# Patient Record
Sex: Male | Born: 1935 | Race: White | Hispanic: No | Smoking: Former smoker
Health system: Southern US, Community
[De-identification: ages and names within clinical notes are randomized; demographics above are authoritative.]

## PROBLEM LIST (undated history)

## (undated) DIAGNOSIS — M1990 Unspecified osteoarthritis, unspecified site: Secondary | ICD-10-CM

## (undated) DIAGNOSIS — J449 Chronic obstructive pulmonary disease, unspecified: Secondary | ICD-10-CM

## (undated) DIAGNOSIS — J8410 Pulmonary fibrosis, unspecified: Secondary | ICD-10-CM

## (undated) DIAGNOSIS — K219 Gastro-esophageal reflux disease without esophagitis: Secondary | ICD-10-CM

## (undated) DIAGNOSIS — I509 Heart failure, unspecified: Secondary | ICD-10-CM

## (undated) DIAGNOSIS — I499 Cardiac arrhythmia, unspecified: Secondary | ICD-10-CM

## (undated) HISTORY — PX: TONSILLECTOMY: SUR1361

## (undated) HISTORY — PX: OTHER SURGICAL HISTORY: SHX169

## (undated) HISTORY — PX: HERNIA REPAIR: SHX51

---

## 1997-10-22 ENCOUNTER — Ambulatory Visit (HOSPITAL_COMMUNITY): Admission: RE | Admit: 1997-10-22 | Discharge: 1997-10-22 | Payer: Self-pay | Admitting: Family Medicine

## 2007-01-25 ENCOUNTER — Encounter: Admission: RE | Admit: 2007-01-25 | Discharge: 2007-01-25 | Payer: Self-pay | Admitting: Internal Medicine

## 2007-01-31 ENCOUNTER — Ambulatory Visit: Payer: Self-pay | Admitting: Critical Care Medicine

## 2007-01-31 LAB — CONVERTED CEMR LAB
ANA Titer 1: 1:40 {titer} — ABNORMAL HIGH
Anti Nuclear Antibody(ANA): POSITIVE — AB
IgE (Immunoglobulin E), Serum: 2171.2 intl units/mL — ABNORMAL HIGH (ref 0.0–180.0)
Rhuematoid fact SerPl-aCnc: <20 intl units/mL — ABNORMAL LOW (ref 0.0–20.0)

## 2007-03-14 ENCOUNTER — Ambulatory Visit: Payer: Self-pay | Admitting: Critical Care Medicine

## 2007-03-15 DIAGNOSIS — J8410 Pulmonary fibrosis, unspecified: Secondary | ICD-10-CM | POA: Insufficient documentation

## 2007-03-15 DIAGNOSIS — J479 Bronchiectasis, uncomplicated: Secondary | ICD-10-CM

## 2007-03-15 DIAGNOSIS — J309 Allergic rhinitis, unspecified: Secondary | ICD-10-CM | POA: Insufficient documentation

## 2007-06-02 ENCOUNTER — Ambulatory Visit: Payer: Self-pay | Admitting: Critical Care Medicine

## 2009-12-25 ENCOUNTER — Encounter: Admission: RE | Admit: 2009-12-25 | Discharge: 2009-12-25 | Payer: Self-pay | Admitting: Neurology

## 2010-11-11 NOTE — Assessment & Plan Note (Signed)
[facility]                             PULMONARY OFFICE NOTE   NAME:[name], [name]                      MRN:          [id number]  DATE:03/14/2007                            DOB:          1925/07/08    [name] returns in followup, 77 year old with pulmonary fibrosis,  chronic cough.  CT scan showed bronchiectasis and minimal peripheral  fibrotic changes but no evidence of active alveolitis.  We started the  patient on Advair 100/50 one spray twice daily and he is back here for  followup visit.  Lab data had been reviewed.  His RAST assay was largely  positive, IgE level was at 2171.  He has positive hypersensitivity to  mites, dog and cat dander, all grasses, mold, house dust, weed  allergens, tree allergens.  His alpha-1 antitrypsin level is normal at  112, his ANA was positive at 1:40 titer.  His hypersensitivity assay was  negative.  His sed rate was 28, rheumatoid factor less than 20.  Pulmonary functions obtained today showed normal spirometry, total lung  capacity normal at 88% predicted, diffusion capacity normal at 96%  predicted.   EXAMINATION:  Temperature is 97, blood pressure 120/72, pulse 58,  saturation 94% on room air.  CHEST:  Showed to be clear today without evidence of wheeze, rhonchi.  CARDIAC:  Showed a regular rate and rhythm without S3, normal S1-S2.  ABDOMEN:  Soft, nontender.  EXTREMITIES:  Showed no edema or clubbing.  SKIN:  Clear.   IMPRESSION:  Mild pulmonary fibrosis and bronchiectasis, moderate  persistent asthma.   PLAN:  Maintain Advair as currently dosed, we will hold off on Xolair  therapy or allergy evaluation at this time with allergy desensitization  being held off.  He will pursue avoidance of passive smoke exposure with  his spouse and we will see the patient back in followup in 3 months.     Charlcie Cradle Delford Field, MD, [facility]  Electronically Signed    PEW/MedQ  DD: 03/14/2007  DT: 03/14/2007  Job #:  [id number]   cc:   Deirdre Peer. Polite, M.D.

## 2010-11-11 NOTE — Assessment & Plan Note (Signed)
[facility]                             PULMONARY OFFICE NOTE   [name], Cameron [name]                      MRN:          [id number]  DATE:06/02/2007                            DOB:          September 17, 1925    [name] returns for follow-up.  He is a 77 year old with asthmatic  bronchitis, mild pulmonary fibrosis, bronchiectasis.  He has multiple  atopic factors on his RAST assay with an IgE level of 2170, and he has  positivity to dust, molds, a variety of grasses and tree pollen, cat and  animal dander.  He is on the Advair 100/50 mcg one spray b.i.d. and is  having no cough, wheezing or shortness of breath at this time.   EXAM:  Temperature 97, blood pressure 120/68, pulse 64, saturation 97%  in room air.  CHEST:  Completely clear without evidence of wheeze, rale or rhonchi.  CARDIAC:  A regular rate and rhythm without S3, normal S1, S2.  ABDOMEN:  Soft, nontender.  EXTREMITIES:  No edema, clubbing or venous disease.  SKIN:  Clear.  NEUROLOGIC:  Intact.   IMPRESSION:  Bronchiectasis, mild pulmonary fibrosis.   PLAN:  To reduce to Pulmicort 180 mcg two sprays b.i.d. and discontinue  further Advair.  We will see the patient back in follow-up.     Charlcie Cradle Delford Field, MD, [facility]  Electronically Signed    PEW/MedQ  DD: 06/02/2007  DT: 06/02/2007  Job #: [id number]   cc:   Deirdre Peer. Polite, M.D.

## 2010-11-11 NOTE — Assessment & Plan Note (Signed)
[facility]                             PULMONARY OFFICE NOTE   NAME:[facility]on [name], Cameron [name]                      MRN:          [id number]  DATE:01/31/2007                            DOB:          04/13/1926    CHIEF COMPLAINT:  Evaluate pulmonary fibrosis.   HISTORY OF PRESENT ILLNESS:  This is a 77 year old white male who had  episode of bronchitis a week ago.  He was coughing up thick clear mucus.  He was treated with Advair 250/50 one spray b.i.d.  This has been  helpful to him.  He is coughing less.  He is less short of breath.  He  did have some shortness of breath with heavy exertion but not with  minimal exertion.  He is not short of breath at rest.  He has no  hemoptysis, no chest discomfort.  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  No previous history of known pneumonia yet does have  passive smoke exposure with his spouse, and he smoked two packs a day  for 15 years, but quit smoking in 1964.  He is referred for further  evaluation.  Does have some heartburn for which Prilosec is helpful.  He  has no difficulty swallowing.  Does note some nasal congestion.  He has  no hand or foot swelling, no joint swelling, no rash.   PAST MEDICAL HISTORY:  No history of hypertension, heart disease, heart  dysrhythmias, diabetes, cholesterol, stroke, cancer or blood clotting  disorders, kidney disease.   OPERATIVE HISTORY:  Herniorrhaphy 20 years ago, hemorrhoidectomy 45  years ago; otherwise, noncontributory.   MEDICATION ALLERGIES:  None.   CURRENT MEDICATIONS:  1. Prilosec 20 mg daily.  2. Vitamin B complex daily.  3. Aspirin 81 mg daily.  4. Fish oil daily.  5. Multivitamin daily.  6. Glucosamine daily.   SOCIAL HISTORY:  Smoking history is noted above.   FAMILY HISTORY:  Lives at home with his wife.  Retired from Landscape architect.   FAMILY HISTORY:  Allergies in a mother and sister.  Heart disease in a  mother.   Brother had lung cancer and had breast cancer.   PHYSICAL EXAMINATION:  VITAL SIGNS:  Temp 97.5, blood pressure 110/54,  pulse 86, saturation 97% on room air.  CHEST:  Showed distant breath sounds, no wheeze, rale or rhonchi noted.  CARDIAC:  Showed a regular rate and rhythm without S3, normal S1, S2.  ABDOMEN:  Soft, nontender.  EXTREMITIES:  Showed no edema, clubbing or venous disease.  SKIN:  Clear.  NEUROLOGIC:  Intact.  HEENT:  Showed no jugular venous distention, no lymphadenopathy.  Oropharynx clear.  NECK:  Supple.   LABORATORY DATA:  CT scan of the chest had been obtained and reviewed  and revealed patchy pulmonary fibrosis which is primarily peripheral  with central thickening extending to the pleural surface as well as  bronchiectasis in the upper and lower lung zones, irregular pleural  thickening as well, no masses seen, no lymphadenopathy seen.   IMPRESSION:  Bronchiectasis with associated traction of pulmonary  fibrosis likely related to previous chemical exposure as well as ongoing  smoke exposure passive and previous smoking in the past.  No evidence of  active or progressive pulmonary fibrosis or idiopathic __________  interstitial pneumonitis or desquamated interstitial pneumonitis.   RECOMMENDATIONS:  Continue Advair at 100/50 one spray b.i.d.  Try to  minimize further passive smoke exposure.  Obtain full set of pulmonary  function studies.  Obtain full set of labs including hypersensitivity  panel, ANA, alpha-1 antitrypsin, RAST assay, IgE, rheumatoid factor, and  sed rate.     Charlcie Cradle Delford Field, MD, [facility]  Electronically Signed    PEW/MedQ  DD: 01/31/2007  DT: 01/31/2007  Job #: [id number]   cc:   Deirdre Peer. Polite, M.D.

## 2012-04-14 ENCOUNTER — Other Ambulatory Visit: Payer: Self-pay | Admitting: Critical Care Medicine

## 2012-04-14 DIAGNOSIS — J8410 Pulmonary fibrosis, unspecified: Secondary | ICD-10-CM

## 2012-04-14 MED ORDER — INFLUENZA VIRUS VACC SPLIT PF IM SUSP
0.5000 mL | Freq: Once | INTRAMUSCULAR | Status: DC
Start: 2012-04-14 — End: 2012-04-14

## 2012-10-04 ENCOUNTER — Inpatient Hospital Stay (HOSPITAL_COMMUNITY)
Admission: AD | Admit: 2012-10-04 | Discharge: 2012-10-09 | DRG: 287 | Disposition: A | Discharge status: Home / Self Care | Payer: Medicare Other | Source: Ambulatory Visit | Attending: Internal Medicine | Admitting: Internal Medicine

## 2012-10-04 ENCOUNTER — Encounter (HOSPITAL_COMMUNITY): Payer: Self-pay | Admitting: Internal Medicine

## 2012-10-04 DIAGNOSIS — Z87891 Personal history of nicotine dependence: Secondary | ICD-10-CM

## 2012-10-04 DIAGNOSIS — I4819 Other persistent atrial fibrillation: Secondary | ICD-10-CM | POA: Diagnosis present

## 2012-10-04 DIAGNOSIS — J449 Chronic obstructive pulmonary disease, unspecified: Secondary | ICD-10-CM | POA: Diagnosis present

## 2012-10-04 DIAGNOSIS — I5021 Acute systolic (congestive) heart failure: Principal | ICD-10-CM | POA: Diagnosis present

## 2012-10-04 DIAGNOSIS — K219 Gastro-esophageal reflux disease without esophagitis: Secondary | ICD-10-CM | POA: Diagnosis present

## 2012-10-04 DIAGNOSIS — J4489 Other specified chronic obstructive pulmonary disease: Secondary | ICD-10-CM | POA: Diagnosis present

## 2012-10-04 DIAGNOSIS — E039 Hypothyroidism, unspecified: Secondary | ICD-10-CM | POA: Diagnosis present

## 2012-10-04 DIAGNOSIS — I2510 Atherosclerotic heart disease of native coronary artery without angina pectoris: Secondary | ICD-10-CM | POA: Diagnosis present

## 2012-10-04 DIAGNOSIS — Z7901 Long term (current) use of anticoagulants: Secondary | ICD-10-CM

## 2012-10-04 DIAGNOSIS — I428 Other cardiomyopathies: Secondary | ICD-10-CM | POA: Diagnosis present

## 2012-10-04 DIAGNOSIS — Z7982 Long term (current) use of aspirin: Secondary | ICD-10-CM

## 2012-10-04 DIAGNOSIS — Z79899 Other long term (current) drug therapy: Secondary | ICD-10-CM

## 2012-10-04 DIAGNOSIS — I509 Heart failure, unspecified: Secondary | ICD-10-CM | POA: Diagnosis present

## 2012-10-04 DIAGNOSIS — R413 Other amnesia: Secondary | ICD-10-CM | POA: Diagnosis present

## 2012-10-04 DIAGNOSIS — I4891 Unspecified atrial fibrillation: Secondary | ICD-10-CM | POA: Diagnosis present

## 2012-10-04 DIAGNOSIS — N289 Disorder of kidney and ureter, unspecified: Secondary | ICD-10-CM | POA: Diagnosis present

## 2012-10-04 DIAGNOSIS — J309 Allergic rhinitis, unspecified: Secondary | ICD-10-CM | POA: Diagnosis present

## 2012-10-04 DIAGNOSIS — J8410 Pulmonary fibrosis, unspecified: Secondary | ICD-10-CM | POA: Diagnosis present

## 2012-10-04 HISTORY — DX: Cardiac arrhythmia, unspecified: I49.9

## 2012-10-04 HISTORY — DX: Gastro-esophageal reflux disease without esophagitis: K21.9

## 2012-10-04 HISTORY — DX: Heart failure, unspecified: I50.9

## 2012-10-04 HISTORY — DX: Unspecified osteoarthritis, unspecified site: M19.90

## 2012-10-04 HISTORY — DX: Chronic obstructive pulmonary disease, unspecified: J44.9

## 2012-10-04 LAB — CBC WITH DIFFERENTIAL/PLATELET
Basophils Absolute: 0 10*3/uL (ref 0.0–0.1)
Eosinophils Relative: 3 % (ref 0–5)
HCT: 35.6 % — ABNORMAL LOW (ref 39.0–52.0)
Hemoglobin: 12.6 g/dL — ABNORMAL LOW (ref 13.0–17.0)
Lymphocytes Relative: 25 % (ref 12–46)
Lymphs Abs: 1.8 10*3/uL (ref 0.7–4.0)
MCV: 100 fL (ref 78.0–100.0)
Monocytes Absolute: 0.7 10*3/uL (ref 0.1–1.0)
Monocytes Relative: 10 % (ref 3–12)
Neutro Abs: 4.5 10*3/uL (ref 1.7–7.7)
RDW: 14.1 % (ref 11.5–15.5)
WBC: 7.2 10*3/uL (ref 4.0–10.5)

## 2012-10-04 LAB — COMPREHENSIVE METABOLIC PANEL
AST: 33 U/L (ref 0–37)
BUN: 23 mg/dL (ref 6–23)
CO2: 25 mEq/L (ref 19–32)
Calcium: 9.1 mg/dL (ref 8.4–10.5)
Chloride: 110 mEq/L (ref 96–112)
Creatinine, Ser: 1.26 mg/dL (ref 0.50–1.35)
GFR calc Af Amer: 59 mL/min — ABNORMAL LOW (ref >90)
GFR calc non Af Amer: 51 mL/min — ABNORMAL LOW (ref >90)
Glucose, Bld: 89 mg/dL (ref 70–99)
Total Bilirubin: 0.5 mg/dL (ref 0.3–1.2)

## 2012-10-04 LAB — TROPONIN I: Troponin I: <0.3 ng/mL (ref <0.30)

## 2012-10-04 LAB — MAGNESIUM: Magnesium: 2.5 mg/dL (ref 1.5–2.5)

## 2012-10-04 LAB — TSH: TSH: 6.165 u[IU]/mL — ABNORMAL HIGH (ref 0.350–4.500)

## 2012-10-04 MED ORDER — FUROSEMIDE 10 MG/ML IJ SOLN
20.0000 mg | Freq: Two times a day (BID) | INTRAMUSCULAR | Status: DC
  Administered 2012-10-04 – 2012-10-05 (×2): 20 mg via INTRAVENOUS
  Filled 2012-10-04 (×4): qty 2

## 2012-10-04 MED ORDER — SODIUM CHLORIDE 0.9 % IJ SOLN
3.0000 mL | Freq: Two times a day (BID) | INTRAMUSCULAR | Status: DC
  Administered 2012-10-04 – 2012-10-09 (×5): 3 mL via INTRAVENOUS

## 2012-10-04 MED ORDER — ACETAMINOPHEN 325 MG PO TABS: 650.0000 mg | ORAL_TABLET | ORAL | Status: DC | PRN

## 2012-10-04 MED ORDER — LISINOPRIL 2.5 MG PO TABS
2.5000 mg | ORAL_TABLET | Freq: Every day | ORAL | Status: DC
  Administered 2012-10-04: 2.5 mg via ORAL
  Filled 2012-10-04 (×2): qty 1

## 2012-10-04 MED ORDER — HEPARIN SODIUM (PORCINE) 5000 UNIT/ML IJ SOLN
5000.0000 [IU] | Freq: Three times a day (TID) | INTRAMUSCULAR | Status: DC
  Administered 2012-10-04 – 2012-10-05 (×2): 5000 [IU] via SUBCUTANEOUS
  Filled 2012-10-04 (×5): qty 1

## 2012-10-04 MED ORDER — SODIUM CHLORIDE 0.9 % IJ SOLN
3.0000 mL | INTRAMUSCULAR | Status: DC | PRN
  Administered 2012-10-05: 3 mL via INTRAVENOUS

## 2012-10-04 MED ORDER — SODIUM CHLORIDE 0.9 % IV SOLN
250.0000 mL | INTRAVENOUS | Status: DC | PRN
  Administered 2012-10-04: 250 mL via INTRAVENOUS

## 2012-10-04 MED ORDER — METOPROLOL TARTRATE 25 MG PO TABS
25.0000 mg | ORAL_TABLET | Freq: Two times a day (BID) | ORAL | Status: DC
  Administered 2012-10-04: 25 mg via ORAL
  Filled 2012-10-04 (×3): qty 1

## 2012-10-04 MED ORDER — ASPIRIN EC 325 MG PO TBEC
325.0000 mg | DELAYED_RELEASE_TABLET | Freq: Every day | ORAL | Status: DC
  Administered 2012-10-04: 325 mg via ORAL
  Filled 2012-10-04 (×2): qty 1

## 2012-10-04 MED ORDER — DEXTROSE 5 % IV SOLN
5.0000 mg/h | INTRAVENOUS | Status: DC
  Administered 2012-10-04: 10 mg/h via INTRAVENOUS
  Administered 2012-10-04 – 2012-10-05 (×2): 5 mg/h via INTRAVENOUS
  Filled 2012-10-04 (×2): qty 100

## 2012-10-04 MED ORDER — DILTIAZEM LOAD VIA INFUSION
10.0000 mg | Freq: Once | INTRAVENOUS | Status: AC
  Filled 2012-10-04: qty 10

## 2012-10-04 MED ORDER — METOPROLOL TARTRATE 12.5 MG HALF TABLET
12.5000 mg | ORAL_TABLET | Freq: Two times a day (BID) | ORAL | Status: DC
  Filled 2012-10-04: qty 1

## 2012-10-04 MED ORDER — ONDANSETRON HCL 4 MG/2ML IJ SOLN: 4.0000 mg | Freq: Four times a day (QID) | INTRAMUSCULAR | Status: DC | PRN

## 2012-10-04 NOTE — H&P (Signed)
[facility] History and Physical  JD MCCASTER JXB:[id number] DOB: 1925-10-27 DOA: 10/04/2012  Referring physician: Dr. Nehemiah Settle PCP: Katy Apo, MD  Specialists: none  Chief Complaint: SOB  HPI: Cameron [name] is a 79 y.o. male  The past medical history of gastritis that comes in for lower extremity swelling and shortness of breath with exertion 2 weeks prior to admission. He relates he had his annual physical with Dr. polite on the day of admission. Dr. polite found to be within the regular rhythm and the symptoms he was in here to [facility]. His legs been having getting edematous for the past 2 weeks progressively getting worse. He also has been having shortness of breath with exertion that has slowly and gradually progressed to poor he can you and walk 100 feet without getting short of breath. He denies any PND orthopnea. He denies any cough, chest pain in the past month. He relates no changes in bowel movement, vision, denies any dizziness, vomiting, nausea or palpitations.  Review of Systems: The patient denies anorexia, fever, weight loss,, vision loss, decreased hearing, hoarseness, chest pain, syncope,  balance deficits, hemoptysis, abdominal pain, melena, hematochezia, severe indigestion/heartburn, hematuria, incontinence, genital sores, muscle weakness, suspicious skin lesions, transient blindness, difficulty walking, depression, unusual weight change, abnormal bleeding, enlarged lymph nodes, angioedema, and breast masses.    Past Medical History  Diagnosis Date  . GERD (gastroesophageal reflux disease)    History reviewed. No pertinent past surgical history. Social History:  reports that he has quit smoking. His smoking use included Cigarettes. He has a 80 pack-year smoking history. He does not have any smokeless tobacco history on file. He reports that he drinks about 1.2 ounces of alcohol per week. He reports that he does not use illicit drugs. Visit home by  himself and perform all his ADLs  Not on File  Family History  Problem Relation Age of Onset  . Heart attack Mother   . Pulmonary embolism Father     Prior to Admission medications   Not on File   Physical Exam: Filed Vitals:   10/04/12 1344  BP: 120/89  Pulse: 120  Temp: 98.7 F (37.1 C)  TempSrc: Oral  Resp: 18  SpO2: 97%    BP 120/89  Pulse 120  Temp(Src) 98.7 F (37.1 C) (Oral)  Resp 18  SpO2 97%  General Appearance:    Alert, cooperative, no distress, appears stated age  Head:    Normocephalic, without obvious abnormality, atraumatic           Throat:   Lips, mucosa, and tongue normal; teeth and gums normal  Neck:   Supple, symmetrical, trachea midline, no JVD, positive hepatojugular reflux   Back:     Symmetric, no curvature, ROM normal, no CVA tenderness  Lungs:     good air movement with crackles in bases bilaterally.   Chest wall:    No tenderness or deformity  Heart:    Irregular rate and rhythm, S1 and S2 normal, no gallops or rubs   Abdomen:     Soft, non-tender, bowel sounds active all four quadrants,    no masses, no organomegaly        Extremities:   Extremities normal, atraumatic, no cyanosis, 3+ edema   Pulses:   2+ and symmetric all extremities  Skin:   Skin color, texture, turgor normal, no rashes or lesions  Lymph nodes:   Cervical, supraclavicular, and axillary nodes normal  Neurologic:   CNII-XII intact. Normal  strength, sensation and reflexes      throughout    Labs on Admission:  Basic Metabolic Panel: No results found for this basename: NA, K, CL, CO2, GLUCOSE, BUN, CREATININE, CALCIUM, MG, PHOS,  in the last 168 hours Liver Function Tests: No results found for this basename: AST, ALT, ALKPHOS, BILITOT, PROT, ALBUMIN,  in the last 168 hours No results found for this basename: LIPASE, AMYLASE,  in the last 168 hours No results found for this basename: AMMONIA,  in the last 168 hours CBC: No results found for this basename: WBC,  NEUTROABS, HGB, HCT, MCV, PLT,  in the last 168 hours Cardiac Enzymes: No results found for this basename: CKTOTAL, CKMB, CKMBINDEX, TROPONINI,  in the last 168 hours  BNP (last 3 results) No results found for this basename: PROBNP,  in the last 8760 hours CBG: No results found for this basename: GLUCAP,  in the last 168 hours  Radiological Exams on Admission: No results found.  EKG: Independently reviewed. pending  Assessment/Plan Acute systolic heart failure: - This seems most likely an episode of acute  heart failure, he does have positive JVD and hepatojugular reflux, he does crackles at his lung bases. I am going to start him on low dose Lasix and strict I's and O's, restrict his fluids to 1000 cc, low sodium diet. We'll get a 2-D echo, 12-lead EKG. Also start her on beta blocker and ace inhibitor low dose. We'll get a basic metabolic panel. I will also start her on Lasix twice a day and monitor strict I's and O's.  - Check a TSH and cycle his cardiac enzymes for possible silent MI.  - Replete potassium as needed, get daily weights.  Atrial fibrillation with RVR: - Start him on low-dose beta blocker as his pulse seems to be irregularly irregular. He is tachycardic at greater than 110, I will get EKG and admitted to telemetry monitor his rhythm.  Code Status: full Family Communication: none Disposition Plan: inpatient.   Time spent: 65 minutes  Marinda Elk [facility] Pager [phone]  If 7PM-7AM, please contact night-coverage [email or web address] Password [email or web address] 10/04/2012, 1:56 PM

## 2012-10-04 NOTE — Progress Notes (Signed)
Utilization Review Completed.Cameron [name] T4/01/2013  

## 2012-10-04 NOTE — Progress Notes (Signed)
Disposition Note  Cameron [name], is a 79 y.o. male,   MRN: [id number]  -  DOB - Apr 24, 1926  Outpatient Primary MD for the patient is No primary provider on file.   There were no vitals taken for this visit.  Active Problems:   ALLERGIC RHINITIS   Atrial fibrillation with RVR   Acute systolic heart failure   79 year old who comes in to Dr. Idelle Crouch office for with RVR and some fluid overload. Dr. polite and the patient did not feel comfortable treating this as an outpatient. Injury to her age and she does not have a caregiver which can help. So we'll bring her in for direct admission.

## 2012-10-04 NOTE — Progress Notes (Signed)
Tel show Atrial Fib, heart rate 130 to 140, bp 112/72 pt resting in bed,Dr. Robb Matar notified Egbert Garibaldi A

## 2012-10-05 ENCOUNTER — Inpatient Hospital Stay (HOSPITAL_COMMUNITY): Payer: Medicare Other

## 2012-10-05 LAB — BASIC METABOLIC PANEL
BUN: 24 mg/dL — ABNORMAL HIGH (ref 6–23)
CO2: 25 mEq/L (ref 19–32)
Calcium: 8.7 mg/dL (ref 8.4–10.5)
GFR calc non Af Amer: 44 mL/min — ABNORMAL LOW (ref >90)
Glucose, Bld: 106 mg/dL — ABNORMAL HIGH (ref 70–99)
Sodium: 140 mEq/L (ref 135–145)

## 2012-10-05 MED ORDER — DILTIAZEM HCL 60 MG PO TABS
120.0000 mg | ORAL_TABLET | Freq: Every day | ORAL | Status: DC
  Filled 2012-10-05: qty 2

## 2012-10-05 MED ORDER — DILTIAZEM HCL 100 MG IV SOLR
5.0000 mg/h | INTRAVENOUS | Status: DC
  Administered 2012-10-05: 5 mg/h via INTRAVENOUS
  Filled 2012-10-05: qty 100

## 2012-10-05 MED ORDER — PANTOPRAZOLE SODIUM 40 MG PO TBEC
40.0000 mg | DELAYED_RELEASE_TABLET | Freq: Every day | ORAL | Status: DC
  Administered 2012-10-05 – 2012-10-09 (×3): 40 mg via ORAL
  Filled 2012-10-05 (×3): qty 1

## 2012-10-05 MED ORDER — FUROSEMIDE 40 MG PO TABS
40.0000 mg | ORAL_TABLET | Freq: Every day | ORAL | Status: DC
  Administered 2012-10-05 – 2012-10-09 (×4): 40 mg via ORAL
  Filled 2012-10-05 (×5): qty 1

## 2012-10-05 MED ORDER — APIXABAN 5 MG PO TABS
5.0000 mg | ORAL_TABLET | Freq: Two times a day (BID) | ORAL | Status: DC
  Administered 2012-10-05: 5 mg via ORAL
  Filled 2012-10-05 (×2): qty 1

## 2012-10-05 MED ORDER — DILTIAZEM HCL 100 MG IV SOLR
5.0000 mg/h | INTRAVENOUS | Status: AC
  Administered 2012-10-05: 5 mg/h via INTRAVENOUS
  Filled 2012-10-05: qty 100

## 2012-10-05 MED ORDER — DONEPEZIL HCL 10 MG PO TABS
10.0000 mg | ORAL_TABLET | Freq: Every day | ORAL | Status: DC
  Administered 2012-10-05 – 2012-10-08 (×4): 10 mg via ORAL
  Filled 2012-10-05 (×5): qty 1

## 2012-10-05 MED ORDER — LEVOTHYROXINE SODIUM 75 MCG PO TABS
75.0000 ug | ORAL_TABLET | Freq: Every day | ORAL | Status: DC
  Administered 2012-10-05 – 2012-10-09 (×5): 75 ug via ORAL
  Filled 2012-10-05 (×6): qty 1

## 2012-10-05 MED ORDER — DILTIAZEM HCL ER COATED BEADS 120 MG PO CP24
120.0000 mg | ORAL_CAPSULE | Freq: Every day | ORAL | Status: DC
Start: 2012-10-05 — End: 2012-10-05
  Filled 2012-10-05: qty 1

## 2012-10-05 MED ORDER — METOPROLOL TARTRATE 25 MG PO TABS
25.0000 mg | ORAL_TABLET | Freq: Four times a day (QID) | ORAL | Status: DC
  Administered 2012-10-05: 25 mg via ORAL
  Filled 2012-10-05 (×5): qty 1

## 2012-10-05 NOTE — Progress Notes (Signed)
Spoke with Dr. Isabel Caprice about cardizem. Per note, wanted to continue IV cardizem, but PO cardizem had been ordered as well. Pt had not received PO dose. He clarified and said to continue the IV cardizem. Will continue to monitor. Duwaine Maxin, RN

## 2012-10-05 NOTE — Progress Notes (Signed)
Subjective: No problems overnight, patient states he feels a little better. Patient's heart rate is better controlled. Currently diltiazem drip is off. At this time at anticoagulation. Oral diltiazem. Patient has underlying lung disease therefore we'll hold beta blocker at this time. Await echo.  Objective: Vital signs in last 24 hours: Temp:  [98.1 F (36.7 C)-98.7 F (37.1 C)] 98.5 F (36.9 C) (04/09 0554) Pulse Rate:  [67-127] 93 (04/09 0554) Resp:  [18] 18 (04/08 2132) BP: (91-120)/(54-89) 98/75 mmHg (04/09 0554) SpO2:  [96 %-97 %] 97 % (04/09 0554) Weight:  [76.2 kg (167 lb 15.9 oz)-78.6 kg (173 lb 4.5 oz)] 76.2 kg (167 lb 15.9 oz) (04/09 0554) Weight change:  Last BM Date: 10/04/12  Intake/Output from previous day: 04/08 0701 - 04/09 0700 In: 480 [P.O.:480] Out: 1725 [Urine:1725] Intake/Output this shift:    General appearance: alert and cooperative Resp: right basilar crackles Cardio: irregularly irregular rhythm Extremities: 1-2+ edema  Lab Results:  Results for orders placed during the hospital encounter of 10/04/12 (from the past 24 hour(s))  CBC WITH DIFFERENTIAL     Status: Abnormal   Collection Time    10/04/12  2:34 PM      Result Value Range   WBC 7.2  4.0 - 10.5 K/uL   RBC 3.56 (*) 4.22 - 5.81 MIL/uL   Hemoglobin 12.6 (*) 13.0 - 17.0 g/dL   HCT 40.9 (*) 81.1 - 91.4 %   MCV 100.0  78.0 - 100.0 fL   MCH 35.4 (*) 26.0 - 34.0 pg   MCHC 35.4  30.0 - 36.0 g/dL   RDW 78.2  95.6 - 21.3 %   Platelets 158  150 - 400 K/uL   Neutrophils Relative 62  43 - 77 %   Neutro Abs 4.5  1.7 - 7.7 K/uL   Lymphocytes Relative 25  12 - 46 %   Lymphs Abs 1.8  0.7 - 4.0 K/uL   Monocytes Relative 10  3 - 12 %   Monocytes Absolute 0.7  0.1 - 1.0 K/uL   Eosinophils Relative 3  0 - 5 %   Eosinophils Absolute 0.2  0.0 - 0.7 K/uL   Basophils Relative 0  0 - 1 %   Basophils Absolute 0.0  0.0 - 0.1 K/uL  COMPREHENSIVE METABOLIC PANEL     Status: Abnormal   Collection Time   10/04/12  2:34 PM      Result Value Range   Sodium 143  135 - 145 mEq/L   Potassium 4.5  3.5 - 5.1 mEq/L   Chloride 110  96 - 112 mEq/L   CO2 25  19 - 32 mEq/L   Glucose, Bld 89  70 - 99 mg/dL   BUN 23  6 - 23 mg/dL   Creatinine, Ser 0.86  0.50 - 1.35 mg/dL   Calcium 9.1  8.4 - 57.8 mg/dL   Total Protein 6.0  6.0 - 8.3 g/dL   Albumin 3.1 (*) 3.5 - 5.2 g/dL   AST 33  0 - 37 U/L   ALT 33  0 - 53 U/L   Alkaline Phosphatase 42  39 - 117 U/L   Total Bilirubin 0.5  0.3 - 1.2 mg/dL   GFR calc non Af Amer 51 (*) >90 mL/min   GFR calc Af Amer 59 (*) >90 mL/min  MAGNESIUM     Status: None   Collection Time    10/04/12  2:34 PM      Result Value Range   Magnesium  2.5  1.5 - 2.5 mg/dL  TSH     Status: Abnormal   Collection Time    10/04/12  2:34 PM      Result Value Range   TSH 6.165 (*) 0.350 - 4.500 uIU/mL  TROPONIN I     Status: None   Collection Time    10/04/12  2:34 PM      Result Value Range   Troponin I <0.30  <0.30 ng/mL  TROPONIN I     Status: None   Collection Time    10/04/12  7:43 PM      Result Value Range   Troponin I <0.30  <0.30 ng/mL  TROPONIN I     Status: None   Collection Time    10/05/12  1:55 AM      Result Value Range   Troponin I <0.30  <0.30 ng/mL  BASIC METABOLIC PANEL     Status: Abnormal   Collection Time    10/05/12  1:55 AM      Result Value Range   Sodium 140  135 - 145 mEq/L   Potassium 4.2  3.5 - 5.1 mEq/L   Chloride 106  96 - 112 mEq/L   CO2 25  19 - 32 mEq/L   Glucose, Bld 106 (*) 70 - 99 mg/dL   BUN 24 (*) 6 - 23 mg/dL   Creatinine, Ser 1.61 (*) 0.50 - 1.35 mg/dL   Calcium 8.7  8.4 - 09.6 mg/dL   GFR calc non Af Amer 44 (*) >90 mL/min   GFR calc Af Amer 51 (*) >90 mL/min      Studies/Results: No results found.  Medications:  Prior to Admission:  Prescriptions prior to admission  Medication Sig Dispense Refill  . aspirin EC 81 MG tablet Take 81 mg by mouth daily.      . Multiple Vitamins-Minerals (MULTIVITAMIN WITH MINERALS)  tablet Take 1 tablet by mouth daily.      Marland Kitchen omeprazole (PRILOSEC OTC) 20 MG tablet Take 20 mg by mouth daily.      Marland Kitchen OVER THE COUNTER MEDICATION Take 1 tablet by mouth daily. Nutritional supplement - does not remember name       Scheduled: . aspirin EC  325 mg Oral Daily  . furosemide  20 mg Intravenous Q12H  . heparin  5,000 Units Subcutaneous Q8H  . lisinopril  2.5 mg Oral Daily  . metoprolol tartrate  25 mg Oral BID  . sodium chloride  3 mL Intravenous Q12H   Continuous: . diltiazem (CARDIZEM) infusion 5 mg/hr (10/05/12 0605)   EAV:WUJWJX chloride, acetaminophen, ondansetron (ZOFRAN) IV, sodium chloride  Assessment/Plan: New onset atrial fibrillation, troponin I negative. Patient presented with volume overload, dyspnea. Continue rate control with diltiazem orally. With history of lung disease will hold beta blocker. Add anti-coagulation. Patient does have some cognitive dysfunction and according to family there are plans to put him in assisted living at this time they are agreeable to anticoagulation. [facility] to see patient today Hypothyroidism, TSH still slightly elevated, patient recently had adjustment on an outpatient basis, we will continue to followup outpatient. Memory loss, continue Aricept   LOS: 1 day   Cameron [name] D 10/05/2012, 8:43 AM

## 2012-10-05 NOTE — Progress Notes (Signed)
  Echocardiogram 2D Echocardiogram has been performed.  Cameron [name] 10/05/2012, 2:03 PM

## 2012-10-05 NOTE — Consult Note (Signed)
Admit date: 10/04/2012 Referring Physician  Dr. polite Primary Physician  Dr. polite Primary Cardiologist  Elsia Lasota-new Reason for Consultation  atrial fibrillation  HPI: 79 year old man who had been noticing worsening leg edema.  He went to his primary care doctor yesterday and was found to be in atrial fibrillation with rapid ventricular response.  He denies any chest discomfort or shortness of breath.  He had been walking regularly at home without difficulty.  He was sent to the hospital and placed on intravenous diltiazem.  This has controlled his heart rate significantly.  Due to social issues, there is a concern as to whether or not he would tolerate anticoagulation.  He has some mild dementia.  He fell several weeks ago.  It was thought that he would need to be in a supervised setting to tolerate anticoagulation properly.  His eating has been erratic.  There are efforts to have him placed in assisted living.    He denies any prior bleeding problems.  He did have a chest CT in the past showing coronary calcifications.  He denies any anginal symptoms.    PMH:   Past Medical History  Diagnosis Date  . GERD (gastroesophageal reflux disease)   . CHF (congestive heart failure)   . Dysrhythmia     atrial fibrilation  . COPD (chronic obstructive pulmonary disease)   . Arthritis      PSH:   Past Surgical History  Procedure Laterality Date  . Hernia repair    . Tonsillectomy      Allergies:  Review of patient's allergies indicates no known allergies. Prior to Admit Meds:   Prescriptions prior to admission  Medication Sig Dispense Refill  . aspirin EC 81 MG tablet Take 81 mg by mouth daily.      . Multiple Vitamins-Minerals (MULTIVITAMIN WITH MINERALS) tablet Take 1 tablet by mouth daily.      Marland Kitchen omeprazole (PRILOSEC OTC) 20 MG tablet Take 20 mg by mouth daily.      Marland Kitchen OVER THE COUNTER MEDICATION Take 1 tablet by mouth daily. Nutritional supplement - does not remember name       Fam HX:     Family History  Problem Relation Age of Onset  . Heart attack Mother   . Pulmonary embolism Father    Social HX:    History   Social History  . Marital Status: Married    Spouse Name: N/A    Number of Children: N/A  . Years of Education: N/A   Occupational History  . Not on file.   Social History Main Topics  . Smoking status: Former Smoker -- 2.00 packs/day for 40 years    Types: Cigarettes    Quit date: 06/29/1961  . Smokeless tobacco: Never Used  . Alcohol Use: 1.2 oz/week    2 Glasses of wine per week     Comment: occasional  . Drug Use: No  . Sexually Active: No   Other Topics Concern  . Not on file   Social History Narrative  . No narrative on file     ROS:  All 11 ROS were addressed and are negative except what is stated in the HPI  Physical Exam: Blood pressure 98/75, pulse 93, temperature 98.5 F (36.9 C), temperature source Oral, resp. rate 18, height 5\' 10"  (1.778 m), weight 76.2 kg (167 lb 15.9 oz), SpO2 97.00%.    General:  well nourished, in no acute distress Head:  Normal cephalic and atramatic  Lungs:   Clear  bilaterally to auscultation and percussion. Heart:  Irregularly irregular rhythm, normal rate Abdomen: abdomen soft and non-tender Msk:  Normal strength and tone for age. Extremities:  1+ bilateral lower extremity pitting  edema.   Neuro: Alert and oriented X . Psych:  Normal ffect, responds appropriately    Labs:   Lab Results  Component Value Date   WBC 7.2 10/04/2012   HGB 12.6* 10/04/2012   HCT 35.6* 10/04/2012   MCV 100.0 10/04/2012   PLT 158 10/04/2012    Recent Labs Lab 10/04/12 1434 10/05/12 0155  NA 143 140  K 4.5 4.2  CL 110 106  CO2 25 25  BUN 23 24*  CREATININE 1.26 1.41*  CALCIUM 9.1 8.7  PROT 6.0  --   BILITOT 0.5  --   ALKPHOS 42  --   ALT 33  --   AST 33  --   GLUCOSE 89 106*   No results found for this basename: PTT   No results found for this basename: INR, PROTIME   Lab Results  Component Value Date    TROPONINI <0.30 10/05/2012     No results found for this basename: CHOL   No results found for this basename: HDL   No results found for this basename: LDLCALC   No results found for this basename: TRIG   No results found for this basename: CHOLHDL   No results found for this basename: LDLDIRECT      Radiology:  Dg Chest 2 View  10/05/2012  *RADIOLOGY REPORT*  Clinical Data: Shortness of breath, swelling in the legs, atrial fibrillation  CHEST - 2 VIEW  Comparison: Chest x-ray of 01/19/2007 and CT chest of 01/25/2007  Findings:  Diffusely coarse interstitial lung markings throughout the lungs primarily the lung bases is consistent with chronic interstitial lung disease with basilar predominance.  No focal infiltrate or effusion is seen.  The lungs are slightly hyperaerated. Mild cardiomegaly is present. No acute bony abnormality is seen.  IMPRESSION: Interstitial lung disease with basilar predominance.  No active process.  Cardiomegaly.   Original Report Authenticated By: Dwyane Dee, M.D.     EKG:  Atrial fibrillation with improved ventricular response, nonspecific ST segment changes  ASSESSMENT:  Atrial fibrillation of unclear duration  PLAN:  He'll need echocardiogram.  Continue IV diltiazem for now for rate control.  If his LV function is decreased, will likely switch him to a beta blocker.  Continue IV Lasix for fluid overload.  This may be related to diastolic dysfunction from the atrial fibrillation with rapid ventricular response.  Need to rule out systolic dysfunction with echocardiogram.  Evidence of coronary artery disease/calcifications by CT.  Consider outpatient Cardiolite stress test to evaluate for ischemia.  Discussed stroke prevention with the patient.  He has not had bleeding problems.  He is willing to take anticoagulation.  Will start Eliquis.  Will follow.  Corky Crafts., MD  10/05/2012  11:24 AM
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
## 2012-10-06 LAB — BASIC METABOLIC PANEL
CO2: 23 mEq/L (ref 19–32)
Calcium: 8.4 mg/dL (ref 8.4–10.5)
Glucose, Bld: 99 mg/dL (ref 70–99)
Potassium: 3.9 mEq/L (ref 3.5–5.1)
Sodium: 134 mEq/L — ABNORMAL LOW (ref 135–145)

## 2012-10-06 MED ORDER — ASPIRIN 81 MG PO CHEW
324.0000 mg | CHEWABLE_TABLET | ORAL | Status: AC
  Administered 2012-10-07: 324 mg via ORAL
  Filled 2012-10-06: qty 4

## 2012-10-06 MED ORDER — SODIUM CHLORIDE 0.9 % IJ SOLN
3.0000 mL | Freq: Two times a day (BID) | INTRAMUSCULAR | Status: DC
  Administered 2012-10-06 (×3): 3 mL via INTRAVENOUS

## 2012-10-06 MED ORDER — ONDANSETRON HCL 4 MG/2ML IJ SOLN: 4.0000 mg | Freq: Four times a day (QID) | INTRAMUSCULAR | Status: DC | PRN

## 2012-10-06 MED ORDER — SODIUM CHLORIDE 0.9 % IV SOLN: 250.0000 mL | INTRAVENOUS | Status: DC | PRN

## 2012-10-06 MED ORDER — SODIUM CHLORIDE 0.9 % IV SOLN
INTRAVENOUS | Status: DC
  Administered 2012-10-07: 06:00:00 via INTRAVENOUS

## 2012-10-06 MED ORDER — DILTIAZEM HCL 30 MG PO TABS
30.0000 mg | ORAL_TABLET | Freq: Three times a day (TID) | ORAL | Status: DC
  Administered 2012-10-06 – 2012-10-07 (×2): 30 mg via ORAL
  Filled 2012-10-06 (×7): qty 1

## 2012-10-06 MED ORDER — ACETAMINOPHEN 325 MG PO TABS: 650.0000 mg | ORAL_TABLET | ORAL | Status: DC | PRN

## 2012-10-06 MED ORDER — METOPROLOL TARTRATE 50 MG PO TABS
50.0000 mg | ORAL_TABLET | Freq: Four times a day (QID) | ORAL | Status: DC
  Administered 2012-10-06 – 2012-10-09 (×13): 50 mg via ORAL
  Filled 2012-10-06 (×17): qty 1

## 2012-10-06 MED ORDER — DIAZEPAM 5 MG PO TABS: 5.0000 mg | ORAL_TABLET | ORAL | Status: DC

## 2012-10-06 MED ORDER — DIAZEPAM 5 MG PO TABS
5.0000 mg | ORAL_TABLET | ORAL | Status: AC
  Administered 2012-10-07: 5 mg via ORAL
  Filled 2012-10-06: qty 1

## 2012-10-06 MED ORDER — SODIUM CHLORIDE 0.9 % IJ SOLN: 3.0000 mL | INTRAMUSCULAR | Status: DC | PRN

## 2012-10-06 NOTE — Progress Notes (Signed)
SUBJECTIVE:  No complaints.  Wants to go home.  OBJECTIVE:   Vitals:   Filed Vitals:   10/05/12 1953 10/06/12 0500 10/06/12 0831 10/06/12 1155  BP: 101/62 94/71 115/75 101/83  Pulse: 89 98 119   Temp: 97.7 F (36.5 C) 97.6 F (36.4 C)    TempSrc: Oral Oral    Resp: 18 18    Height:      Weight:  75.4 kg (166 lb 3.6 oz)    SpO2: 97% 98%     I&O's:   Intake/Output Summary (Last 24 hours) at 10/06/12 1159 Last data filed at 10/06/12 9147  Gross per 24 hour  Intake    720 ml  Output   1275 ml  Net   -555 ml   TELEMETRY: Reviewed telemetry pt in atrial fibrillation with rapid ventricular response:     PHYSICAL EXAM General: well nourished, in no acute distress  Head: Normal cephalic and atramatic  Lungs: Clear bilaterally to auscultation and percussion.  Heart: Irregularly irregular rhythm, tachycardic Abdomen: abdomen soft and non-tender  Msk: Normal strength and tone for age.  Extremities: 1+ bilateral lower extremity pitting edema.  Neuro: Alert and oriented X .  Psych: Normal affect, responds appropriately    LABS: Basic Metabolic Panel:  Recent Labs  82/95/62 1434 10/05/12 0155  NA 143 140  K 4.5 4.2  CL 110 106  CO2 25 25  GLUCOSE 89 106*  BUN 23 24*  CREATININE 1.26 1.41*  CALCIUM 9.1 8.7  MG 2.5  --    Liver Function Tests:  Recent Labs  10/04/12 1434  AST 33  ALT 33  ALKPHOS 42  BILITOT 0.5  PROT 6.0  ALBUMIN 3.1*   No results found for this basename: LIPASE, AMYLASE,  in the last 72 hours CBC:  Recent Labs  10/04/12 1434  WBC 7.2  NEUTROABS 4.5  HGB 12.6*  HCT 35.6*  MCV 100.0  PLT 158   Cardiac Enzymes:  Recent Labs  10/04/12 1434 10/04/12 1943 10/05/12 0155  TROPONINI <0.30 <0.30 <0.30   BNP: No components found with this basename: POCBNP,  D-Dimer: No results found for this basename: DDIMER,  in the last 72 hours Hemoglobin A1C: No results found for this basename: HGBA1C,  in the last 72 hours Fasting Lipid  Panel: No results found for this basename: CHOL, HDL, LDLCALC, TRIG, CHOLHDL, LDLDIRECT,  in the last 72 hours Thyroid Function Tests:  Recent Labs  10/04/12 1434  TSH 6.165*   Anemia Panel: No results found for this basename: VITAMINB12, FOLATE, FERRITIN, TIBC, IRON, RETICCTPCT,  in the last 72 hours Coag Panel:   No results found for this basename: INR, PROTIME    RADIOLOGY: Dg Chest 2 View  10/05/2012  *RADIOLOGY REPORT*  Clinical Data: Shortness of breath, swelling in the legs, atrial fibrillation  CHEST - 2 VIEW  Comparison: Chest x-ray of 01/19/2007 and CT chest of 01/25/2007  Findings:  Diffusely coarse interstitial lung markings throughout the lungs primarily the lung bases is consistent with chronic interstitial lung disease with basilar predominance.  No focal infiltrate or effusion is seen.  The lungs are slightly hyperaerated. Mild cardiomegaly is present. No acute bony abnormality is seen.  IMPRESSION: Interstitial lung disease with basilar predominance.  No active process.  Cardiomegaly.   Original Report Authenticated By: Dwyane Dee, M.D.       ASSESSMENT: Atrial fibrillation with rapid ventricular response, acute systolic heart failure, lower extremity edema  PLAN:  Increase metoprolol for better  rate control.  He responded better to calcium channel blocker.  If the higher dose of metoprolol does not work, we'll add back low-dose Cardizem.  Beta blockade for systolic heart failure.  Ejection fraction 30-35%.  We'll workup for ischemia tomorrow with cardiac catheterization.  Holding Eliquis  for now.  Planning radial approach.  I discussed the procedure with the patient and he is willing to proceed.  All questions answered.  Lasix for fluid overload symptoms.  Corky Crafts., MD  10/06/2012  11:59 AM

## 2012-10-06 NOTE — Progress Notes (Signed)
10/06/12 4:17Pm Patient ambulated in hallway x1 assist, approximately 350 feet  Gait steady, patient back in room call bell with in reach will monitor patient. TED hose applied as ordered. Maram Bently, Randall An rN

## 2012-10-06 NOTE — Evaluation (Signed)
Physical Therapy Evaluation Patient Details Name: Cameron [name] MRN: [id number] DOB: [birth year] Today's Date: 10/06/2012 Time: 7829-5621 PT Time Calculation (min): 27 min  PT Assessment / Plan / Recommendation Clinical Impression  Pt is a pleasent 79 y.o. male who presents with deficits in functional mobility seondary to balance and decreased mobility. Pt is at supervision for most tasks but will benefit from acute PT to improve activity tolerance and balance to facilitate d/c home. Will continue to see as indicated.Encouraged Pt to continue ambulation with nsg throughout the day.    PT Assessment  Patient needs continued PT services    Follow Up Recommendations  No PT follow up    Does the patient have the potential to tolerate intense rehabilitation      Barriers to Discharge Decreased caregiver support Pt wife is currently at [facility] due to [facility]. Pt can stay with children if needs to upon discharge    Equipment Recommendations  None recommended by PT    Recommendations for Other Services     Frequency Min 3X/week    Precautions / Restrictions     Pertinent Vitals/Pain No pain at this time. SpO2 remained >96% on rm air, HR 60s      Mobility  Bed Mobility Bed Mobility: Supine to Sit;Sitting - Scoot to Edge of Bed Supine to Sit: 5: Supervision Sitting - Scoot to [place] of Bed: 5: Supervision Transfers Transfers: Sit to Stand;Stand to Sit Sit to Stand: 5: Supervision Stand to Sit: 5: Supervision Ambulation/Gait Ambulation/Gait Assistance: 5: Supervision;4: Min guard Ambulation Distance (Feet): 200 Feet Assistive device: None Ambulation/Gait Assistance Details: pt with some modest deficits in balance but no loss of balance noted Gait Pattern: Step-through pattern;Narrow base of support Gait velocity: decreased Stairs: No    Exercises General Exercises - Lower Extremity Ankle Circles/Pumps: AROM;10 reps Long Arc Quad: AROM;10 reps Hip Flexion/Marching: AROM;10  reps   PT Diagnosis: Difficulty walking;Abnormality of gait;Generalized weakness  PT Problem List: Decreased range of motion;Decreased activity tolerance;Decreased balance;Decreased mobility PT Treatment Interventions: DME instruction;Gait training;Stair training;Functional mobility training;Therapeutic activities;Therapeutic exercise;Balance training;Patient/family education   PT Goals Acute Rehab PT Goals PT Goal Formulation: With patient Time For Goal Achievement: 10/13/12 Potential to Achieve Goals: Good Pt will Ambulate: >150 feet;Independently PT Goal: Ambulate - Progress: Goal set today Pt will Go Up / Down Stairs: 3-5 stairs;with modified independence PT Goal: Up/Down Stairs - Progress: Goal set today  Visit Information  Last PT Received On: 10/06/12 Assistance Needed: +1    Subjective Data  Subjective: Every day is a good day Patient Stated Goal: to go home   Prior Functioning  Home Living Lives With: Spouse (wife is at [facility]) Available Help at Discharge: Family;Available PRN/intermittently Type of Home: Apartment Home Access: Stairs to enter Entrance Stairs-Number of Steps: 3 Entrance Stairs-Rails: Right;Left Home Layout: One level;Able to live on main level with bedroom/bathroom Bathroom Shower/Tub: Health visitor: Standard Home Adaptive Equipment: None;Straight cane Prior Function Level of Independence: Independent Able to Take Stairs?: Yes Driving: Yes Vocation: Retired Musician: [employer] Dominant Hand: Right    Cognition  Cognition Overall Cognitive Status: Appears within functional limits for tasks assessed/performed Arousal/Alertness: Awake/alert Orientation Level: Appears intact for tasks assessed;Oriented X4 / Intact Behavior During Session: [facility] for tasks performed    Extremity/Trunk Assessment Right Upper Extremity Assessment RUE ROM/Strength/Tone: WFL for tasks assessed RUE Sensation: WFL - Light Touch;WFL  - Proprioception Left Upper Extremity Assessment LUE ROM/Strength/Tone: WFL for tasks assessed LUE Sensation: WFL -  Light Touch;WFL - Proprioception Right Lower Extremity Assessment RLE ROM/Strength/Tone: WFL for tasks assessed RLE Sensation: Deficits RLE Sensation Deficits: general due to edema Left Lower Extremity Assessment LLE ROM/Strength/Tone: WFL for tasks assessed LLE Sensation: Deficits LLE Sensation Deficits: general due to edema Trunk Assessment Trunk Assessment: Normal   Balance Balance Balance Assessed: Yes High Level Balance High Level Balance Activites: Side stepping;Backward walking;Direction changes;Turns;Sudden stops;Head turns High Level Balance Comments: overall steady with modest deficits  End of Session PT - End of Session Equipment Utilized During Treatment: Gait belt Activity Tolerance: Patient tolerated treatment well Patient left: in chair;with call bell/phone within reach Nurse Communication: Mobility status  GP     Fabio Asa 10/06/2012, 11:27 AM Charlotte Crumb, PT DPT  [phone]

## 2012-10-06 NOTE — Progress Notes (Signed)
Subjective: Patient without any new complaints, he states he's not willing to go to assisted living facility. Echocardiogram noted with LV dysfunction. Patient stress test in 2008 was normal with preserved EF. Cardiology input appreciated, plans for catheterization tomorrow  Objective: Vital signs in last 24 hours: Temp:  [97.6 F (36.4 C)-97.7 F (36.5 C)] 97.6 F (36.4 C) (04/10 0500) Pulse Rate:  [89-119] 119 (04/10 0831) Resp:  [18] 18 (04/10 0500) BP: (94-115)/(62-83) 101/83 mmHg (04/10 1155) SpO2:  [97 %-98 %] 98 % (04/10 0500) Weight:  [75.4 kg (166 lb 3.6 oz)] 75.4 kg (166 lb 3.6 oz) (04/10 0500) Weight change: -3.2 kg (-7 lb 0.9 oz) Last BM Date: 10/05/12  Intake/Output from previous day: 04/09 0701 - 04/10 0700 In: 480 [P.O.:480] Out: 1275 [Urine:1275] Intake/Output this shift: Total I/O In: 240 [P.O.:240] Out: -   General appearance: alert and cooperative Resp: clear to auscultation bilaterally Cardio: irregularly irregular rhythm Extremities: 1-2+ edema  Lab Results:  No results found for this or any previous visit (from the past 24 hour(s)).    Studies/Results: Dg Chest 2 View  10/05/2012  *RADIOLOGY REPORT*  Clinical Data: Shortness of breath, swelling in the legs, atrial fibrillation  CHEST - 2 VIEW  Comparison: Chest x-ray of 01/19/2007 and CT chest of 01/25/2007  Findings:  Diffusely coarse interstitial lung markings throughout the lungs primarily the lung bases is consistent with chronic interstitial lung disease with basilar predominance.  No focal infiltrate or effusion is seen.  The lungs are slightly hyperaerated. Mild cardiomegaly is present. No acute bony abnormality is seen.  IMPRESSION: Interstitial lung disease with basilar predominance.  No active process.  Cardiomegaly.   Original Report Authenticated By: Dwyane Dee, M.D.     Medications:  Prior to Admission:  Prescriptions prior to admission  Medication Sig Dispense Refill  . aspirin EC 81 MG  tablet Take 81 mg by mouth daily.      . Multiple Vitamins-Minerals (MULTIVITAMIN WITH MINERALS) tablet Take 1 tablet by mouth daily.      Marland Kitchen omeprazole (PRILOSEC OTC) 20 MG tablet Take 20 mg by mouth daily.      Marland Kitchen OVER THE COUNTER MEDICATION Take 1 tablet by mouth daily. Nutritional supplement - does not remember name       Scheduled: . [START ON 10/07/2012] aspirin  324 mg Oral Pre-Cath  . [START ON 10/07/2012] diazepam  5 mg Oral On Call  . donepezil  10 mg Oral QHS  . furosemide  40 mg Oral Daily  . levothyroxine  75 mcg Oral QAC breakfast  . metoprolol tartrate  50 mg Oral Q6H  . pantoprazole  40 mg Oral Daily  . sodium chloride  3 mL Intravenous Q12H  . sodium chloride  3 mL Intravenous Q12H   Continuous:   Assessment/Plan: New onset atrial fibrillation presenting with volume overload, echo showing LV dysfunction. Currently anticoagulations placed on hold as there are plans for heart catheterization. Continue current treatment per cardiology. Hypothyroidism further outpatient management Mild cognitive dysfunction. Patient at this time not willing to consider assisted living CHF, systolic dysfunction, acute in the setting of atrial fibrillation. Continue medications per cardiology Mild renal insufficiency since diuresis started, check followup bmet in the morning   LOS: 2 days   Carlo Guevarra D 10/06/2012, 1:03 PM

## 2012-10-07 ENCOUNTER — Encounter (HOSPITAL_COMMUNITY)
Admission: AD | Disposition: A | Discharge status: Home / Self Care | Payer: Self-pay | Source: Ambulatory Visit | Attending: Internal Medicine

## 2012-10-07 HISTORY — PX: LEFT HEART CATHETERIZATION WITH CORONARY ANGIOGRAM: SHX5451

## 2012-10-07 LAB — BASIC METABOLIC PANEL
Calcium: 8.6 mg/dL (ref 8.4–10.5)
Creatinine, Ser: 1.39 mg/dL — ABNORMAL HIGH (ref 0.50–1.35)
GFR calc Af Amer: 52 mL/min — ABNORMAL LOW (ref >90)
GFR calc non Af Amer: 45 mL/min — ABNORMAL LOW (ref >90)
Sodium: 135 mEq/L (ref 135–145)

## 2012-10-07 LAB — POCT I-STAT 3, VENOUS BLOOD GAS (G3P V)
Acid-base deficit: 1 mmol/L (ref 0.0–2.0)
Bicarbonate: 25.3 mEq/L — ABNORMAL HIGH (ref 20.0–24.0)
O2 Saturation: 51 %
TCO2: 26 mmol/L (ref 0–100)
pCO2, Ven: 42.3 mmHg — ABNORMAL LOW (ref 45.0–50.0)
pO2, Ven: 28 mmHg — CL (ref 30.0–45.0)
pO2, Ven: 29 mmHg — CL (ref 30.0–45.0)

## 2012-10-07 LAB — PROTIME-INR
INR: 1.16 (ref 0.00–1.49)
Prothrombin Time: 14.6 seconds (ref 11.6–15.2)

## 2012-10-07 LAB — POCT I-STAT 3, ART BLOOD GAS (G3+)
Bicarbonate: 25.2 mEq/L — ABNORMAL HIGH (ref 20.0–24.0)
TCO2: 23 mmol/L (ref 0–100)
pCO2 arterial: 47 mmHg — ABNORMAL HIGH (ref 35.0–45.0)
pH, Arterial: 7.338 — ABNORMAL LOW (ref 7.350–7.450)
pH, Arterial: 7.463 — ABNORMAL HIGH (ref 7.350–7.450)

## 2012-10-07 SURGERY — LEFT AND RIGHT HEART CATHETERIZATION WITH CORONARY ANGIOGRAM
Anesthesia: Moderate Sedation | Laterality: Bilateral

## 2012-10-07 SURGERY — LEFT HEART CATHETERIZATION WITH CORONARY ANGIOGRAM
Anesthesia: LOCAL

## 2012-10-07 MED ORDER — ACETAMINOPHEN 325 MG PO TABS: 650.0000 mg | ORAL_TABLET | ORAL | Status: DC | PRN

## 2012-10-07 MED ORDER — SODIUM CHLORIDE 0.9 % IV SOLN: 1.0000 mL/kg/h | INTRAVENOUS | Status: AC

## 2012-10-07 MED ORDER — MULTI-VITAMIN/MINERALS PO TABS: 1.0000 | ORAL_TABLET | Freq: Every day | ORAL | Status: DC

## 2012-10-07 MED ORDER — VERAPAMIL HCL 2.5 MG/ML IV SOLN
INTRAVENOUS | Status: AC
  Filled 2012-10-07: qty 2

## 2012-10-07 MED ORDER — ONDANSETRON HCL 4 MG/2ML IJ SOLN: 4.0000 mg | Freq: Four times a day (QID) | INTRAMUSCULAR | Status: DC | PRN

## 2012-10-07 MED ORDER — LIDOCAINE HCL (PF) 1 % IJ SOLN
INTRAMUSCULAR | Status: AC
  Filled 2012-10-07: qty 30

## 2012-10-07 MED ORDER — SODIUM CHLORIDE 0.9 % IV SOLN: INTRAVENOUS | Status: DC

## 2012-10-07 MED ORDER — ASPIRIN EC 81 MG PO TBEC
81.0000 mg | DELAYED_RELEASE_TABLET | Freq: Every day | ORAL | Status: DC
  Administered 2012-10-08: 81 mg via ORAL
  Filled 2012-10-07 (×2): qty 1

## 2012-10-07 MED ORDER — ADULT MULTIVITAMIN W/MINERALS CH
1.0000 | ORAL_TABLET | Freq: Every day | ORAL | Status: DC
  Administered 2012-10-07 – 2012-10-09 (×3): 1 via ORAL
  Filled 2012-10-07 (×3): qty 1

## 2012-10-07 MED ORDER — HEPARIN (PORCINE) IN NACL 2-0.9 UNIT/ML-% IJ SOLN
INTRAMUSCULAR | Status: AC
  Filled 2012-10-07: qty 1000

## 2012-10-07 MED ORDER — OFF THE BEAT BOOK
Freq: Once | Status: DC
  Filled 2012-10-07: qty 1

## 2012-10-07 MED ORDER — HEPARIN SODIUM (PORCINE) 1000 UNIT/ML IJ SOLN
INTRAMUSCULAR | Status: AC
  Filled 2012-10-07: qty 1

## 2012-10-07 MED ORDER — DILTIAZEM HCL 30 MG PO TABS
30.0000 mg | ORAL_TABLET | Freq: Four times a day (QID) | ORAL | Status: DC
  Administered 2012-10-07 – 2012-10-08 (×4): 30 mg via ORAL
  Filled 2012-10-07 (×8): qty 1

## 2012-10-07 NOTE — CV Procedure (Signed)
PROCEDURE:  Left heart catheterization with selective coronary angiography, right heart catheterization.  INDICATIONS:  Acute systolic heart failure  The risks, benefits, and details of the procedure were explained to the patient.  The patient verbalized understanding and wanted to proceed.  Informed written consent was obtained.  PROCEDURE TECHNIQUE: After Xylocaine anesthesia a  A 7 Fr sheath was placed in the right femoral vein and a 76F sheath was placed in the right radial artery with a single anterior needle wall stick.   Left coronary angiography was done using a Judkins L3 guide catheter.  Right coronary angiography was done using a Judkins R4 guide catheter.  Left ventriculography was done using a pigtail catheter.    CONTRAST:  Total of 50 cc.  COMPLICATIONS:  None.    HEMODYNAMICS:  Aortic pressure was 90/58; LV pressure was 99/11; LVEDP 19.  There was no significant gradient between the left ventricle and aorta.  Right atrial pressure 18/21 ,mean right atrial pressure 16 mm mercury.  RV pressure 31/8, RVEDP 14 mm Hg PA pressure 34/16, mean PA pressure 27 mm Hg; Pulmonary capillary wedge pressure 21/22, mean pulmonary capillary wedge pressure 19 mm Hg; PA saturation 51%, aortic saturation 96%.  Cardiac index 1.7; cardiac output 3.3 L per minute.  ANGIOGRAPHIC DATA:   The left main coronary artery is absent.  The left anterior descending artery is an ostial 25% stenosis.  In the proximal vessel, there is diffuse disease up to 25%.  The mid vessel and distal vessel are widely patent.  The LAD wraps around the apex.  There is a small diagonal vessel which is patent..  The left circumflex artery is a large dominant vessel.  There is an ostial 25% stenosis.  There is mild to moderate diffuse atherosclerosis in the proximal to mid vessel.  There is a medium-sized OM1.  The OM 2 is a medium-sized vessel and appears widely patent the left PDA is medium size and widely patent..  The right  coronary artery is a small nondominant vessel which appears patent.  LEFT VENTRICULOGRAM:  Left ventricular angiogram was not   done.  IMPRESSIONS:  1.  Absent  left main coronary artery. 2.  Mild, diffuse disease  left anterior descending artery and its branches. 3.  Mild, diffuse disease in the  left circumflex artery and its branches. 4.  Patent  right coronary artery. 5. Normal left ventricular systolic not assessed.  LVEDP 19  mmHg.    RECOMMENDATION:  No significant coronary artery disease.  This is a nonischemic cardiomyopathy.  Will focus on diuresis and rate control.  Will increase Cardizem to 30 mg 4 times a day.  Hopefully, this will lead to better rate control.  Restart Eliquis 5 mg by mouth twice a day tomorrow for stroke prevention.  Continue beta blocker for now.  If additional rate control is needed, could consider digoxin.  Blood pressure is too low for an ACE inhibitor at this time.  We'll repeat echocardiogram in 4-6 weeks.  Consider cardioversion 4 weeks after he restarts Eliquis.

## 2012-10-07 NOTE — Progress Notes (Signed)
SUBJECTIVE:  No complaints.  Wants to go home.  OBJECTIVE:   Vitals:   Filed Vitals:   10/06/12 2158 10/06/12 2349 10/07/12 0615 10/07/12 0616  BP: 115/87 103/73 106/74 106/74  Pulse: 115 82  65  Temp:      TempSrc:      Resp: 18     Height:      Weight:      SpO2: 97%      I&O's:    Intake/Output Summary (Last 24 hours) at 10/07/12 0752 Last data filed at 10/06/12 2100  Gross per 24 hour  Intake    600 ml  Output    150 ml  Net    450 ml   TELEMETRY: Reviewed telemetry pt in atrial fibrillation with rapid ventricular response:     PHYSICAL EXAM General: well nourished, in no acute distress  Head: Normal cephalic and atramatic  Lungs: Clear bilaterally to auscultation and percussion.  Heart: Irregularly irregular rhythm, tachycardic Abdomen: abdomen soft and non-tender  Msk: Normal strength and tone for age.  Extremities: 1+ bilateral lower extremity pitting edema.  Neuro: Alert and oriented X .  Psych: Normal affect, responds appropriately    LABS: Basic Metabolic Panel:  Recent Labs  78/29/56 1434  10/06/12 1415 10/07/12 0605  NA 143  < > 134* 135  K 4.5  < > 3.9 4.6  CL 110  < > 103 103  CO2 25  < > 23 25  GLUCOSE 89  < > 99 103*  BUN 23  < > 21 22  CREATININE 1.26  < > 1.30 1.39*  CALCIUM 9.1  < > 8.4 8.6  MG 2.5  --   --   --   < > = values in this interval not displayed. Liver Function Tests:  Recent Labs  10/04/12 1434  AST 33  ALT 33  ALKPHOS 42  BILITOT 0.5  PROT 6.0  ALBUMIN 3.1*   No results found for this basename: LIPASE, AMYLASE,  in the last 72 hours CBC:  Recent Labs  10/04/12 1434  WBC 7.2  NEUTROABS 4.5  HGB 12.6*  HCT 35.6*  MCV 100.0  PLT 158   Cardiac Enzymes:  Recent Labs  10/04/12 1434 10/04/12 1943 10/05/12 0155  TROPONINI <0.30 <0.30 <0.30   BNP: No components found with this basename: POCBNP,  D-Dimer: No results found for this basename: DDIMER,  in the last 72 hours Hemoglobin A1C: No results  found for this basename: HGBA1C,  in the last 72 hours Fasting Lipid Panel: No results found for this basename: CHOL, HDL, LDLCALC, TRIG, CHOLHDL, LDLDIRECT,  in the last 72 hours Thyroid Function Tests:  Recent Labs  10/04/12 1434  TSH 6.165*   Anemia Panel: No results found for this basename: VITAMINB12, FOLATE, FERRITIN, TIBC, IRON, RETICCTPCT,  in the last 72 hours Coag Panel:   Lab Results  Component Value Date   INR 1.16 10/07/2012    RADIOLOGY: Dg Chest 2 View  10/05/2012  *RADIOLOGY REPORT*  Clinical Data: Shortness of breath, swelling in the legs, atrial fibrillation  CHEST - 2 VIEW  Comparison: Chest x-ray of 01/19/2007 and CT chest of 01/25/2007  Findings:  Diffusely coarse interstitial lung markings throughout the lungs primarily the lung bases is consistent with chronic interstitial lung disease with basilar predominance.  No focal infiltrate or effusion is seen.  The lungs are slightly hyperaerated. Mild cardiomegaly is present. No acute bony abnormality is seen.  IMPRESSION: Interstitial lung disease with  basilar predominance.  No active process.  Cardiomegaly.   Original Report Authenticated By: Dwyane Dee, M.D.       ASSESSMENT: Atrial fibrillation with rapid ventricular response, acute systolic heart failure, lower extremity edema  PLAN:  Increase metoprolol for better rate control.  He responded better to calcium channel blocker. Cardizem added for rate control.  Beta blockade for systolic heart failure.  Ejection fraction 30-35%.  We'll workup for ischemia  with cardiac catheterization.  Holding Eliquis  for now.  Planning radial approach.  I discussed the procedure with the patient and he is willing to proceed.  All questions answered.  Lasix for fluid overload symptoms.  Corky Crafts., MD  10/07/2012  7:52 AM

## 2012-10-07 NOTE — Progress Notes (Signed)
PT Cancellation Note  Patient Details Name: Cameron [name] MRN: [id number] DOB: 1926/02/06   Cancelled Treatment:    Reason Eval/Treat Not Completed: Patient at procedure or test/unavailable; Pt in Cath Lab. Will follow up as able.   Fabio Asa 10/07/2012, 11:15 AM

## 2012-10-07 NOTE — Progress Notes (Signed)
Subjective: Patient pleasant, no apparent distress. Still refuses assisted living. Patient has been made aware of his wishes. Patient's son is supposed to arrive in [place] tomorrow to assist him. Heart catheterization results review with cardiologist.  Objective: Vital signs in last 24 hours: Temp:  [97.3 F (36.3 C)-97.5 F (36.4 C)] 97.5 F (36.4 C) (04/11 1331) Pulse Rate:  [65-115] 92 (04/11 1705) Resp:  [18] 18 (04/11 1331) BP: (97-115)/(55-87) 111/74 mmHg (04/11 1705) SpO2:  [97 %-99 %] 99 % (04/11 1331) Weight:  [79.5 kg (175 lb 4.3 oz)] 79.5 kg (175 lb 4.3 oz) (04/11 1331) Weight change:  Last BM Date: 10/06/12  Intake/Output from previous day: 04/10 0701 - 04/11 0700 In: 600 [P.O.:600] Out: 150 [Urine:150] Intake/Output this shift: Total I/O In: 836.9 [P.O.:240; I.V.:596.9] Out: 200 [Urine:200]  General appearance: alert and cooperative Resp: clear to auscultation bilaterally Cardio: irregularly irregular rhythm Extremities: trace edema, support stockings in place  Lab Results:  Results for orders placed during the hospital encounter of 10/04/12 (from the past 24 hour(s))  BASIC METABOLIC PANEL     Status: Abnormal   Collection Time    10/07/12  6:05 AM      Result Value Range   Sodium 135  135 - 145 mEq/L   Potassium 4.6  3.5 - 5.1 mEq/L   Chloride 103  96 - 112 mEq/L   CO2 25  19 - 32 mEq/L   Glucose, Bld 103 (*) 70 - 99 mg/dL   BUN 22  6 - 23 mg/dL   Creatinine, Ser 0.45 (*) 0.50 - 1.35 mg/dL   Calcium 8.6  8.4 - 40.9 mg/dL   GFR calc non Af Amer 45 (*) >90 mL/min   GFR calc Af Amer 52 (*) >90 mL/min  PROTIME-INR     Status: None   Collection Time    10/07/12  6:05 AM      Result Value Range   Prothrombin Time 14.6  11.6 - 15.2 seconds   INR 1.16  0.00 - 1.49  POCT I-STAT 3, BLOOD GAS (G3+)     Status: Abnormal   Collection Time    10/07/12  8:31 AM      Result Value Range   pH, Arterial 7.338 (*) 7.350 - 7.450   pCO2 arterial 47.0 (*) 35.0 -  45.0 mmHg   pO2, Arterial 89.0  80.0 - 100.0 mmHg   Bicarbonate 25.2 (*) 20.0 - 24.0 mEq/L   TCO2 27  0 - 100 mmol/L   O2 Saturation 96.0     Acid-base deficit 1.0  0.0 - 2.0 mmol/L   Sample type ARTERIAL    POCT I-STAT 3, BLOOD GAS (G3P V)     Status: Abnormal   Collection Time    10/07/12  8:34 AM      Result Value Range   pH, Ven 7.353 (*) 7.250 - 7.300   pCO2, Ven 45.5  45.0 - 50.0 mmHg   pO2, Ven 29.0 (*) 30.0 - 45.0 mmHg   Bicarbonate 25.3 (*) 20.0 - 24.0 mEq/L   TCO2 27  0 - 100 mmol/L   O2 Saturation 52.0     Acid-base deficit 1.0  0.0 - 2.0 mmol/L   Sample type VENOUS     Comment NOTIFIED PHYSICIAN    POCT I-STAT 3, BLOOD GAS (G3P V)     Status: Abnormal   Collection Time    10/07/12  8:38 AM      Result Value Range   pH, Peter Minium  7.369 (*) 7.250 - 7.300   pCO2, Ven 42.3 (*) 45.0 - 50.0 mmHg   pO2, Ven 28.0 (*) 30.0 - 45.0 mmHg   Bicarbonate 24.4 (*) 20.0 - 24.0 mEq/L   TCO2 26  0 - 100 mmol/L   O2 Saturation 51.0     Acid-base deficit 1.0  0.0 - 2.0 mmol/L   Sample type VENOUS     Comment NOTIFIED PHYSICIAN    POCT I-STAT 3, BLOOD GAS (G3+)     Status: Abnormal   Collection Time    10/07/12  8:39 AM      Result Value Range   pH, Arterial 7.463 (*) 7.350 - 7.450   pCO2 arterial 30.2 (*) 35.0 - 45.0 mmHg   pO2, Arterial 74.0 (*) 80.0 - 100.0 mmHg   Bicarbonate 21.6  20.0 - 24.0 mEq/L   TCO2 23  0 - 100 mmol/L   O2 Saturation 96.0     Acid-base deficit 1.0  0.0 - 2.0 mmol/L   Sample type ARTERIAL    POCT ACTIVATED CLOTTING TIME     Status: None   Collection Time    10/07/12  8:49 AM      Result Value Range   Activated Clotting Time 209        Studies/Results:  Echo - Left ventricle: The cavity size was normal. Wall thickness was normal. Systolic function was moderately to severely reduced. The estimated ejection fraction was in the range of 30% to 35%. Diffuse hypokinesis. - Aortic valve: Mild to moderate regurgitation. - Mitral valve: Mild  regurgitation. - Left atrium: The atrium was mildly dilated. - Right ventricle: The cavity size was moderately dilated. Systolic function was moderately reduced. - Right atrium: The atrium was moderately to severely dilated. - Tricuspid valve: Moderate regurgitation. - Pericardium, extracardiac: There was a left pleural effusion.   Medications:  Prior to Admission:  Prescriptions prior to admission  Medication Sig Dispense Refill  . aspirin EC 81 MG tablet Take 81 mg by mouth daily.      . Multiple Vitamins-Minerals (MULTIVITAMIN WITH MINERALS) tablet Take 1 tablet by mouth daily.      Marland Kitchen omeprazole (PRILOSEC OTC) 20 MG tablet Take 20 mg by mouth daily.      Marland Kitchen OVER THE COUNTER MEDICATION Take 1 tablet by mouth daily. Nutritional supplement - does not remember name       Scheduled: . aspirin EC  81 mg Oral Daily  . diltiazem  30 mg Oral Q6H  . donepezil  10 mg Oral QHS  . furosemide  40 mg Oral Daily  . levothyroxine  75 mcg Oral QAC breakfast  . metoprolol tartrate  50 mg Oral Q6H  . multivitamin with minerals  1 tablet Oral Daily  . off the beat book   Does not apply Once  . pantoprazole  40 mg Oral Daily  . sodium chloride  3 mL Intravenous Q12H   Continuous: . sodium chloride      Assessment/Plan: New onset atrial fibrillation presenting with volume overload, echo showing LV dysfunction. Patient now on beta blocker and calcium channel blocker. He underwent heart catheterization he has nonobstructive coronary disease. This is thought to be nonischemic cardiomyopathy. Patient's anticoagulation will be resumed tomorrow, Eliquis 5 mg b.i.d. Please see cardiology note for further recommendations. In regards to patient's Cardizem it would probably be best if he had once a day dosing to make his medical regiment easier.  Mild cognitive dysfunction. Patient at this time not willing to  consider assisted living   CHF, systolic dysfunction, acute in the setting of atrial fibrillation.  Continue medications per cardiology . Patient's echo also showed right heart strain. There are concerns for sleep apnea as a possible cause. Patient has smoked in the past, however not in 40 years making COPD less likely the cause. This can be further followed up on an outpatient basis  Mild renal insufficiency since diuresis started, check followup bmet in the morning .   LOS: 3 days   Jadine Brumley D 10/07/2012, 5:29 PM

## 2012-10-07 NOTE — Progress Notes (Signed)
CSW Connye Burkitt met with the pt at bedside, and his son Dr. Dianne Dun to discuss possible d/c to a snf [facility] where the pt's wife is currently for PT rehab) vs. HH with nurses aides/sitter to stay with the pt at his home.  CSW gave a list of the [facility] [place] information to the son Dr. Mayford Knife.  The son mentioned that the siblings are flying in to take turns taking care of Dad for 2 or 3 weeks in his home.  The son also mentioned that the family is cohesive, great support and have talked with the parents regarding ALF, and the pt and his wife feel that they aren't ready to leave their home and move into an ALF.  CSW Connye Burkitt informed RNCM Raynelle Fanning and CSW Andrey Campanile of this information and that a list of ALF's for [facility] were given.  CSW Connye Burkitt is signing off of this pt to CSW Wilson.  Sherald Barge, LCSW-A Clinical Social Worker [phone]

## 2012-10-08 DIAGNOSIS — I4891 Unspecified atrial fibrillation: Secondary | ICD-10-CM

## 2012-10-08 DIAGNOSIS — I5021 Acute systolic (congestive) heart failure: Principal | ICD-10-CM

## 2012-10-08 LAB — BASIC METABOLIC PANEL
BUN: 26 mg/dL — ABNORMAL HIGH (ref 6–23)
CO2: 22 mEq/L (ref 19–32)
Chloride: 104 mEq/L (ref 96–112)
Creatinine, Ser: 1.26 mg/dL (ref 0.50–1.35)

## 2012-10-08 MED ORDER — DILTIAZEM HCL ER COATED BEADS 180 MG PO CP24
180.0000 mg | ORAL_CAPSULE | Freq: Every day | ORAL | Status: DC
  Administered 2012-10-08 – 2012-10-09 (×2): 180 mg via ORAL
  Filled 2012-10-08 (×2): qty 1

## 2012-10-08 MED ORDER — APIXABAN 5 MG PO TABS
5.0000 mg | ORAL_TABLET | Freq: Two times a day (BID) | ORAL | Status: DC
  Administered 2012-10-08 – 2012-10-09 (×2): 5 mg via ORAL
  Filled 2012-10-08 (×5): qty 1

## 2012-10-08 MED ORDER — DIGOXIN 125 MCG PO TABS
0.1250 mg | ORAL_TABLET | Freq: Every day | ORAL | Status: DC
  Administered 2012-10-08 – 2012-10-09 (×2): 0.125 mg via ORAL
  Filled 2012-10-08 (×2): qty 1

## 2012-10-08 NOTE — Progress Notes (Signed)
Subjective:  He states that breathing is still mildly labored. No chest pain, no groin pain. Improved. Wants to know when he can go home.  Objective:  Vital Signs in the last 24 hours: Temp:  [97.3 F (36.3 C)-97.8 F (36.6 C)] 97.8 F (36.6 C) (04/12 0503) Pulse Rate:  [76-114] 76 (04/12 0503) Resp:  [18-20] 18 (04/12 0503) BP: (97-115)/(55-74) 115/67 mmHg (04/12 0503) SpO2:  [97 %-99 %] 99 % (04/12 0503) Weight:  [75.9 kg (167 lb 5.3 oz)-79.5 kg (175 lb 4.3 oz)] 75.9 kg (167 lb 5.3 oz) (04/12 0503)  Intake/Output from previous day: 04/11 0701 - 04/12 0700 In: 1586.9 [P.O.:540; I.V.:1046.9] Out: 300 [Urine:300]   Physical Exam: General: Well developed, well nourished, in no acute distress. Head:  Normocephalic and atraumatic. Lungs: Clear to auscultation and percussion. Heart: Irregularly irregular. No significant JVD.  No murmur, rubs or gallops.  Abdomen: soft, non-tender, positive bowel sounds. Extremities: No clubbing or cyanosis. Improved, trace edema. Cardiac catheterization site clean dry and intact. Normal distal pulses. Neurologic: Alert and oriented x 3.    Lab Results:   Recent Labs  10/07/12 0605 10/08/12 0600  NA 135 136  K 4.6 4.3  CL 103 104  CO2 25 22  GLUCOSE 103* 100*  BUN 22 26*  CREATININE 1.39* 1.26   Telemetry: Atrial fibrillation heart rate between 80 and 110 Personally viewed.   Cardiac Studies:  Ejection fraction 30%. Minimal nonobstructive coronary artery disease., Nonischemic cardiomyopathy.  Assessment/Plan:  Principal Problem:   Acute systolic heart failure Active Problems:   ALLERGIC RHINITIS   Atrial fibrillation with RVR  79 year old with atrial fibrillation, rapid ventricular response, acute systolic heart failure, diagnosed nonischemic cardiomyopathy, edema, chronic anticoagulation, mild dementia.  1. Acute systolic heart failure-still with mildly labored breathing. Continue with diuretics. Creatinine is stable. Improvement  of heart rate should also help with heart failure. I will add digoxin which may or may not improve overall rate control. Metoprolol 50 mg every 6 hours. Diltiazem 30 mg by mouth every 6 hours. Lasix 40 mg daily. I will consolidate diltiazem to 180 mg CD once a day. He would not be unreasonable to consolidate his metoprolol to 200 mg once a day extended release perhaps tomorrow.  2. Atrial fibrillation-as above. Adding digoxin.  3. Chronic anticoagulation-adding back apixiban 5mg  BID  [name], Cameron 10/08/2012, 10:29 AM

## 2012-10-08 NOTE — Progress Notes (Signed)
Subjective: Cameron [name] was admitted with new on-set Atrial fibrillation. He has had echo and cath: non-ischemic cardiomyopathy with EF 30%. He is doing better.  He is awake and alert in no distress  Objective: Lab: No results found for this basename: WBC, NEUTROABS, HGB, HCT, MCV, PLT,  in the last 72 hours  Recent Labs  10/06/12 1415 10/07/12 0605 10/08/12 0600  NA 134* 135 136  K 3.9 4.6 4.3  CL 103 103 104  GLUCOSE 99 103* 100*  BUN 21 22 26*  CREATININE 1.30 1.39* 1.26  CALCIUM 8.4 8.6 8.8    Imaging:  Scheduled Meds: . apixaban  5 mg Oral BID  . digoxin  0.125 mg Oral Daily  . diltiazem  180 mg Oral Daily  . donepezil  10 mg Oral QHS  . furosemide  40 mg Oral Daily  . levothyroxine  75 mcg Oral QAC breakfast  . metoprolol tartrate  50 mg Oral Q6H  . multivitamin with minerals  1 tablet Oral Daily  . off the beat book   Does not apply Once  . pantoprazole  40 mg Oral Daily  . sodium chloride  3 mL Intravenous Q12H   Continuous Infusions: . sodium chloride     PRN Meds:.sodium chloride, acetaminophen, ondansetron (ZOFRAN) IV, sodium chloride   Physical Exam: Filed Vitals:   10/08/12 0503  BP: 115/67  Pulse: 76  Temp: 97.8 F (36.6 C)  Resp: 18  I&O total this adm: -303 cc  Gen'l - older white man in no distress HEENT- C&S clear Cor - distant heart sounds, IRIR but rate controlled PUlm - Lungs CTAP, no rales. Abd - soft Neuro - A&O x 3, knows examiner.    Assessment/Plan: 1. Cardiac - rate controlled a. Fib. Starting on Eliquis. Medications being consolidated  2. Psych - mild cognitive impairment - lacks insight to his condition and abilities to live independently but does not want to consider AL  3. CHF - minimal diuresis but his lungs are clear and he has no respiratory distress.\ Plan Continue present medications  4. Renal insufficiency - creatinine stable.    Illene Regulus [place] [facility] (o) [phone]; (c) [phone] Call-grp - Patsi Sears IM   Tele: [phone]  10/08/2012, 10:42 AM

## 2012-10-09 DIAGNOSIS — I428 Other cardiomyopathies: Secondary | ICD-10-CM

## 2012-10-09 MED ORDER — METOPROLOL SUCCINATE ER 100 MG PO TB24
200.0000 mg | ORAL_TABLET | Freq: Every day | ORAL | Status: DC
  Filled 2012-10-09: qty 2

## 2012-10-09 MED ORDER — FUROSEMIDE 40 MG PO TABS: 40.0000 mg | ORAL_TABLET | Freq: Every day | ORAL | Status: DC

## 2012-10-09 MED ORDER — DILTIAZEM HCL ER COATED BEADS 180 MG PO CP24: 180.0000 mg | ORAL_CAPSULE | Freq: Every day | ORAL | Status: DC

## 2012-10-09 MED ORDER — LEVOTHYROXINE SODIUM 75 MCG PO TABS: 75.0000 ug | ORAL_TABLET | Freq: Every day | ORAL | Status: DC

## 2012-10-09 MED ORDER — METOPROLOL SUCCINATE ER 200 MG PO TB24: 200.0000 mg | ORAL_TABLET | Freq: Every day | ORAL | Status: DC

## 2012-10-09 MED ORDER — DONEPEZIL HCL 10 MG PO TABS: 10.0000 mg | ORAL_TABLET | Freq: Every day | ORAL | Status: AC

## 2012-10-09 MED ORDER — APIXABAN 5 MG PO TABS: 5.0000 mg | ORAL_TABLET | Freq: Two times a day (BID) | ORAL | Status: DC

## 2012-10-09 MED ORDER — DIGOXIN 125 MCG PO TABS: 0.1250 mg | ORAL_TABLET | Freq: Every day | ORAL | Status: DC

## 2012-10-09 NOTE — Progress Notes (Signed)
Subjective: Cameron [name] is sitting in a chair. He wants to go home - he states his daughter is visiting from [place] and will be at home with him for several days.   Objective: Lab: No results found for this basename: WBC, NEUTROABS, HGB, HCT, MCV, PLT,  in the last 72 hours  Recent Labs  10/06/12 1415 10/07/12 0605 10/08/12 0600  NA 134* 135 136  K 3.9 4.6 4.3  CL 103 103 104  GLUCOSE 99 103* 100*  BUN 21 22 26*  CREATININE 1.30 1.39* 1.26  CALCIUM 8.4 8.6 8.8    Imaging:  Scheduled Meds: . apixaban  5 mg Oral BID  . digoxin  0.125 mg Oral Daily  . diltiazem  180 mg Oral Daily  . donepezil  10 mg Oral QHS  . furosemide  40 mg Oral Daily  . levothyroxine  75 mcg Oral QAC breakfast  . metoprolol succinate  200 mg Oral Daily  . multivitamin with minerals  1 tablet Oral Daily  . off the beat book   Does not apply Once  . pantoprazole  40 mg Oral Daily  . sodium chloride  3 mL Intravenous Q12H   Continuous Infusions: . sodium chloride     PRN Meds:.sodium chloride, acetaminophen, ondansetron (ZOFRAN) IV, sodium chloride   Physical Exam: Filed Vitals:   10/09/12 0504  BP: 102/57  Pulse: 68  Temp: 98.5 F (36.9 C)  Resp: 20   See d/c summary     Assessment/Plan: For discharge to home with close follow up Dictated #[id number]   Illene Regulus [facility] IM (o) [phone]; (c) [phone] Call-grp - Patsi Sears IM  Tele: [phone]  10/09/2012, 10:50 AM

## 2012-10-09 NOTE — Progress Notes (Signed)
Subjective:  Sitting up in chair, reading paper. Appears comfortable, no trouble with shortness of breath. Telemetry with atrial fibrillation with heart rates mostly below 100.  Objective:  Vital Signs in the last 24 hours: Temp:  [97.6 F (36.4 C)-98.5 F (36.9 C)] 98.5 F (36.9 C) (04/13 0504) Pulse Rate:  [68-84] 68 (04/13 0504) Resp:  [18-20] 20 (04/13 0504) BP: (100-110)/(52-74) 102/57 mmHg (04/13 0504) SpO2:  [94 %-97 %] 94 % (04/13 0504) Weight:  [76.8 kg (169 lb 5 oz)] 76.8 kg (169 lb 5 oz) (04/13 0640)  Intake/Output from previous day: 04/12 0701 - 04/13 0700 In: 360 [P.O.:360] Out: 1300 [Urine:1300]   Physical Exam: General: Well developed, well nourished, in no acute distress. Head:  Normocephalic and atraumatic. Lungs: Clear to auscultation and percussion. Heart: Irregularly irregular, normal rate.  No murmur, rubs or gallops. No JVD Abdomen: soft, non-tender, positive bowel sounds. Extremities: No clubbing or cyanosis. No edema. Compression hose Neurologic: Alert in no acute distress    Telemetry: Atrial fibrillation heart rate less than 100. Personally viewed.   Cardiac Studies:  Ejection fraction 30%. Minimal nonobstructive coronary artery disease., Nonischemic cardiomyopathy.  Assessment/Plan:    79 year old with atrial fibrillation, rapid ventricular response, acute systolic heart failure, diagnosed nonischemic cardiomyopathy, edema, chronic anticoagulation, mild dementia.   1. Acute systolic heart failure-breathing appears improved today. Continue with diuretics, Lasix 40 mg once a day. Creatinine is stable. Digoxin which may or may not improve overall rate control was added yesterday. Metoprolol 50 mg every 6 hours. I will consolidate to metoprolol extended release/Toprol 200 mg once a day. Diltiazem 180 mg CD once a day.   2. Atrial fibrillation-as above.  Digoxin added yesterday.  3. Chronic anticoagulation-apixiban 5mg  BID  I would be comfortable  with discharge. I will leave up to primary team.   Anne Fu, MARK 10/09/2012, 9:23 AM

## 2012-10-09 NOTE — Progress Notes (Signed)
Discharged to home with family office visits in place teaching done  

## 2012-10-10 NOTE — Discharge Summary (Signed)
Cameron [name], Cameron NO.:  [id number]  MEDICAL RECORD NO.:  [id number]  LOCATION:  2002                         FACILITY:  [facility]  PHYSICIAN:  Cameron Gess. Norins, MD  DATE OF BIRTH:  1926/06/13  DATE OF ADMISSION:  10/04/2012 DATE OF DISCHARGE:  10/09/2012                              DISCHARGE SUMMARY   CONSULTANTS:  Corky Crafts, MD for Cardiology.  PROCEDURES:  Imaging:  Chest x-ray on the day of admission, which showed interstitial lung disease with bibasilar prominence.  No active process. Noted to have cardiomegaly.  Cardiology procedures: 1. A 2D echocardiogram on October 05, 2012, which revealed left ventricle     to be normal size.  Wall thickness was normal.  Systolic function     was moderately to severely reduced with an estimated ejection     fraction of 30%-35% with diffuse hypokinesis.  He had mild to     moderate aortic valvular regurgitation, mild to mitral valvular     regurgitation.  Right atrium was moderately to severely dilated.     The left atrium was mildly dilated. 2. Cardiac catheterization performed October 07, 2012, by Dr. Eldridge Dace     with impression of absent left main coronary artery.  Mild diffuse     disease of the left anterior descending artery and its branches.     Mild diffuse disease of the left circumflex artery and its     branches, patent right coronary artery.  Final impression was no     significant coronary artery disease.  There is nonischemic     cardiomyopathy.  This is for medical management.  HISTORY OF PRESENT ILLNESS:  [name] presented to see Dr. Renford Dills on the day of admission for a routine examination.  He had a 2- week history of increasing shortness of breath and dyspnea on exertion. At his exam, he was found to have an irregular heart rhythm and some increasing peripheral edema and was referred to [facility] Emergency Department for evaluation and admission.  The patient reports she  has decreased exercise tolerance, so that he could only walk 100 feet or less without getting shortness of breath.  He denied any chest pain, chest discomfort.  He denied any other systemic symptoms.  The patient was found to be in atrial fibrillation and was subsequently admitted to the hospital.  HOSPITAL COURSE: 1. Cardiac:  The patient was found to have a cardiomyopathy on 2D echo     with EF of 30%-35%.  He was taken to cardiac catheterization lab     with results as listed above.  The patient was felt to have a     nonischemic cardiomyopathy and was a candidate for medical     management.  He did well with no recurrent chest pain or chest     discomfort and improving respiratory function, and per Cardiology     note, Dr. Anne Fu, he was felt stable and ready for discharge to     home. 2. Atrial fibrillation, new onset.  The patient was found to be in     atrial fibrillation.  Echo did reveal mildly left dilated atrium.  The patient was treated medically.  He was a candidate for     anticoagulation and was started on Eliquis.  The patient was on metoprolol and on diltiazem, and his medications were consolidated.  He was also treated with Lasix and did have a good diuresis with the final a balance of -1.2 L.  With the patient having a stable rate with him being ruled out for ischemic heart disease, at this point, his atrial fibrillation is stable and chronic, and he is able to be discharged home on anticoagulation.  He will need outpatient followup to be arranged by Dr. Nehemiah Settle in regards to any further treatment including consideration of cardioversion.  The patient otherwise remained stable.  He was able to take a diet.  He was able ambulate.  Assisted living had been discussed with the patient, but at this time he prefers to return to home.  His daughter is visiting from [place], and so he will have someone at home with him for the next several days and then his son and  daughter-in-law will be returning to the area and can continue to look in on him.  He will need to call [facility] to set up a followup appointment in 5-7 days.  DISCHARGE EXAMINATION:  VITAL SIGNS:  Temperature was 98.5, blood pressure 102/57, heart rate 68, respirations 20, oxygen saturations 94% on room air. GENERAL APPEARANCE:  This is an older Caucasian male, sitting in a Geri chair.  In no acute distress. HEENT:  Conjunctivae and sclerae was clear.  Pupils equal, round, and reactive to light and accommodation. NECK:  Appeared supple. CARDIOVASCULAR:  The patient had 2+ radial pulses.  Precordium was quiet.  Heart sounds were distant, but irregularly irregular at apparent controlled rate. PULMONARY:  The patient has no increased work of breathing.  He had no wheezes or rales. ABDOMEN:  Soft. EXTREMITIES:  No significant edema is noted. NEURO:  The patient is awake, alert.  He is oriented to person, place, time and context.  Speech is clear.  FINAL LABORATORY:  Chemistries from October 08, 2012, with a sodium 136, potassium 4.3, chloride 104, CO2 of 22, BUN 26, creatinine 1.26, glucose was 100.  The patient's last CBC from October 04, 2012, with a white count of 7200, hemoglobin 12.6 g, platelet 158,000.  DISPOSITION:  The patient will be discharged to home.  He does have family in attendance.  He will continue on medications as listed below. He will need to follow up with Dr. Renford Dills who will arrange for cardiology followup as an outpatient.  DISCHARGE MEDICATIONS: 1. Acetaminophen 500 mg 1-2 tablets p.r.n. 2. Eliquis 5 mg b.i.d. 3. Aspirin 81 mg daily can be continued. 4. Digoxin 0.125 mg daily to be continued until changed by his     outpatient physician. 5. Diltiazem CD 180 mg daily. 6. Aricept was started as an inpatient and will continue until the     patient confirms with his outpatient physician, Dr. Nehemiah Settle. 7. Furosemide 40 mg once daily in  light of his cardiomyopathy and     fluid overload. 8. Metoprolol-XL 200 mg 1 tablet daily.  The patient will resume     multivitamins.  He will resume Prilosec 20 mg     daily. 9. Synthroid 75 mcg p.o. daily, started on this admission, will be     continued with followup with Dr. Nehemiah Settle.  The patient's condition at the time of discharge dictation is stable and improved.  Cameron Gess Norins, MD     MEN/MEDQ  D:  10/09/2012  T:  10/10/2012  Job:  [id number]  cc:   Armanda Magic, M.D.

## 2012-10-11 ENCOUNTER — Telehealth: Payer: Self-pay

## 2012-10-11 NOTE — Telephone Encounter (Signed)
Prior authorization form faxed to [phone] for Eliquis 5 mg

## 2012-10-12 ENCOUNTER — Telehealth: Payer: Self-pay

## 2012-10-12 NOTE — Telephone Encounter (Signed)
Fax received from [facility] stating prior authorization for Eliquis has been approved through 10/11/13 under pt's Medicare part D benefit. This was faxed to [facility] [phone] and put to be scanned in pt's chart.

## 2012-12-15 ENCOUNTER — Encounter: Payer: Self-pay | Admitting: Neurology

## 2013-01-17 ENCOUNTER — Ambulatory Visit (INDEPENDENT_AMBULATORY_CARE_PROVIDER_SITE_OTHER): Payer: Medicare Other | Admitting: Neurology

## 2013-03-06 ENCOUNTER — Other Ambulatory Visit (HOSPITAL_COMMUNITY): Payer: Self-pay | Admitting: *Deleted

## 2013-03-06 DIAGNOSIS — R413 Other amnesia: Secondary | ICD-10-CM

## 2013-03-17 ENCOUNTER — Ambulatory Visit (HOSPITAL_COMMUNITY): Payer: Medicare Other

## 2013-03-23 ENCOUNTER — Ambulatory Visit (HOSPITAL_COMMUNITY)
Admission: RE | Admit: 2013-03-23 | Discharge: 2013-03-23 | Disposition: A | Discharge status: Home / Self Care | Payer: Medicare Other | Source: Ambulatory Visit | Attending: *Deleted | Admitting: *Deleted

## 2013-03-23 DIAGNOSIS — J3489 Other specified disorders of nose and nasal sinuses: Secondary | ICD-10-CM | POA: Insufficient documentation

## 2013-03-23 DIAGNOSIS — R413 Other amnesia: Secondary | ICD-10-CM | POA: Insufficient documentation

## 2013-03-23 DIAGNOSIS — G319 Degenerative disease of nervous system, unspecified: Secondary | ICD-10-CM | POA: Insufficient documentation

## 2013-03-26 ENCOUNTER — Encounter (HOSPITAL_COMMUNITY): Payer: Self-pay | Admitting: Emergency Medicine

## 2013-03-26 ENCOUNTER — Emergency Department (HOSPITAL_COMMUNITY): Payer: Medicare Other

## 2013-03-26 ENCOUNTER — Inpatient Hospital Stay (HOSPITAL_COMMUNITY)
Admission: EM | Admit: 2013-03-26 | Discharge: 2013-03-31 | DRG: 871 | Disposition: A | Discharge status: Home / Self Care | Payer: Medicare Other | Attending: Internal Medicine | Admitting: Internal Medicine

## 2013-03-26 DIAGNOSIS — I428 Other cardiomyopathies: Secondary | ICD-10-CM | POA: Diagnosis present

## 2013-03-26 DIAGNOSIS — E43 Unspecified severe protein-calorie malnutrition: Secondary | ICD-10-CM | POA: Diagnosis present

## 2013-03-26 DIAGNOSIS — K219 Gastro-esophageal reflux disease without esophagitis: Secondary | ICD-10-CM | POA: Diagnosis present

## 2013-03-26 DIAGNOSIS — Z87891 Personal history of nicotine dependence: Secondary | ICD-10-CM

## 2013-03-26 DIAGNOSIS — J189 Pneumonia, unspecified organism: Secondary | ICD-10-CM | POA: Diagnosis present

## 2013-03-26 DIAGNOSIS — I4819 Other persistent atrial fibrillation: Secondary | ICD-10-CM | POA: Diagnosis present

## 2013-03-26 DIAGNOSIS — Z23 Encounter for immunization: Secondary | ICD-10-CM

## 2013-03-26 DIAGNOSIS — I10 Essential (primary) hypertension: Secondary | ICD-10-CM | POA: Diagnosis present

## 2013-03-26 DIAGNOSIS — I959 Hypotension, unspecified: Secondary | ICD-10-CM

## 2013-03-26 DIAGNOSIS — I509 Heart failure, unspecified: Secondary | ICD-10-CM | POA: Diagnosis present

## 2013-03-26 DIAGNOSIS — A419 Sepsis, unspecified organism: Principal | ICD-10-CM | POA: Diagnosis present

## 2013-03-26 DIAGNOSIS — J8410 Pulmonary fibrosis, unspecified: Secondary | ICD-10-CM | POA: Diagnosis present

## 2013-03-26 DIAGNOSIS — F0390 Unspecified dementia without behavioral disturbance: Secondary | ICD-10-CM | POA: Diagnosis present

## 2013-03-26 DIAGNOSIS — E039 Hypothyroidism, unspecified: Secondary | ICD-10-CM | POA: Diagnosis present

## 2013-03-26 DIAGNOSIS — I4891 Unspecified atrial fibrillation: Secondary | ICD-10-CM | POA: Diagnosis present

## 2013-03-26 DIAGNOSIS — I5022 Chronic systolic (congestive) heart failure: Secondary | ICD-10-CM

## 2013-03-26 DIAGNOSIS — J4489 Other specified chronic obstructive pulmonary disease: Secondary | ICD-10-CM | POA: Diagnosis present

## 2013-03-26 DIAGNOSIS — M129 Arthropathy, unspecified: Secondary | ICD-10-CM | POA: Diagnosis present

## 2013-03-26 DIAGNOSIS — I5042 Chronic combined systolic (congestive) and diastolic (congestive) heart failure: Secondary | ICD-10-CM | POA: Diagnosis present

## 2013-03-26 DIAGNOSIS — R509 Fever, unspecified: Secondary | ICD-10-CM | POA: Diagnosis present

## 2013-03-26 DIAGNOSIS — Z7982 Long term (current) use of aspirin: Secondary | ICD-10-CM

## 2013-03-26 DIAGNOSIS — J449 Chronic obstructive pulmonary disease, unspecified: Secondary | ICD-10-CM | POA: Diagnosis present

## 2013-03-26 DIAGNOSIS — Z6825 Body mass index (BMI) 25.0-25.9, adult: Secondary | ICD-10-CM

## 2013-03-26 DIAGNOSIS — I5032 Chronic diastolic (congestive) heart failure: Secondary | ICD-10-CM | POA: Diagnosis present

## 2013-03-26 LAB — POCT I-STAT TROPONIN I: Troponin i, poc: 0.01 ng/mL (ref 0.00–0.08)

## 2013-03-26 LAB — URINALYSIS, ROUTINE W REFLEX MICROSCOPIC
Hgb urine dipstick: NEGATIVE
Ketones, ur: 15 mg/dL — AB
Protein, ur: 30 mg/dL — AB
Specific Gravity, Urine: 1.023 (ref 1.005–1.030)
Urobilinogen, UA: 1 mg/dL (ref 0.0–1.0)

## 2013-03-26 LAB — BASIC METABOLIC PANEL
Calcium: 8.5 mg/dL (ref 8.4–10.5)
Creatinine, Ser: 1.12 mg/dL (ref 0.50–1.35)
GFR calc Af Amer: 68 mL/min — ABNORMAL LOW (ref >90)
GFR calc non Af Amer: 58 mL/min — ABNORMAL LOW (ref >90)

## 2013-03-26 LAB — CBC
MCH: 35.7 pg — ABNORMAL HIGH (ref 26.0–34.0)
MCHC: 33.7 g/dL (ref 30.0–36.0)
MCV: 106.1 fL — ABNORMAL HIGH (ref 78.0–100.0)
Platelets: 132 10*3/uL — ABNORMAL LOW (ref 150–400)
RDW: 13.8 % (ref 11.5–15.5)

## 2013-03-26 LAB — PRO B NATRIURETIC PEPTIDE: Pro B Natriuretic peptide (BNP): 4964 pg/mL — ABNORMAL HIGH (ref 0–450)

## 2013-03-26 LAB — LACTIC ACID, PLASMA: Lactic Acid, Venous: 2.3 mmol/L — ABNORMAL HIGH (ref 0.5–2.2)

## 2013-03-26 LAB — URINE MICROSCOPIC-ADD ON

## 2013-03-26 MED ORDER — DIGOXIN 125 MCG PO TABS
0.1250 mg | ORAL_TABLET | Freq: Every day | ORAL | Status: DC
  Administered 2013-03-27 – 2013-03-31 (×5): 0.125 mg via ORAL
  Filled 2013-03-26 (×5): qty 1

## 2013-03-26 MED ORDER — APIXABAN 5 MG PO TABS
5.0000 mg | ORAL_TABLET | Freq: Two times a day (BID) | ORAL | Status: DC
  Administered 2013-03-26 – 2013-03-31 (×10): 5 mg via ORAL
  Filled 2013-03-26 (×12): qty 1

## 2013-03-26 MED ORDER — LEVOTHYROXINE SODIUM 75 MCG PO TABS
75.0000 ug | ORAL_TABLET | Freq: Every day | ORAL | Status: DC
  Administered 2013-03-27 – 2013-03-31 (×5): 75 ug via ORAL
  Filled 2013-03-26 (×8): qty 1

## 2013-03-26 MED ORDER — SODIUM CHLORIDE 0.9 % IJ SOLN
3.0000 mL | Freq: Two times a day (BID) | INTRAMUSCULAR | Status: DC
  Administered 2013-03-26 – 2013-03-31 (×8): 3 mL via INTRAVENOUS

## 2013-03-26 MED ORDER — VANCOMYCIN HCL 10 G IV SOLR
1250.0000 mg | Freq: Once | INTRAVENOUS | Status: AC
  Administered 2013-03-26: 1250 mg via INTRAVENOUS
  Filled 2013-03-26: qty 1250

## 2013-03-26 MED ORDER — SODIUM CHLORIDE 0.9 % IV SOLN: 250.0000 mL | INTRAVENOUS | Status: DC | PRN

## 2013-03-26 MED ORDER — PIPERACILLIN-TAZOBACTAM 3.375 G IVPB
3.3750 g | Freq: Three times a day (TID) | INTRAVENOUS | Status: DC
  Administered 2013-03-27 – 2013-03-28 (×5): 3.375 g via INTRAVENOUS
  Filled 2013-03-26 (×7): qty 50

## 2013-03-26 MED ORDER — SODIUM CHLORIDE 0.9 % IV BOLUS (SEPSIS)
1000.0000 mL | Freq: Once | INTRAVENOUS | Status: AC
  Administered 2013-03-26: 1000 mL via INTRAVENOUS

## 2013-03-26 MED ORDER — PIPERACILLIN-TAZOBACTAM 3.375 G IVPB 30 MIN
3.3750 g | INTRAVENOUS | Status: AC
  Administered 2013-03-26: 3.375 g via INTRAVENOUS
  Filled 2013-03-26: qty 50

## 2013-03-26 MED ORDER — METOPROLOL SUCCINATE ER 100 MG PO TB24: 200.0000 mg | ORAL_TABLET | Freq: Every day | ORAL | Status: DC

## 2013-03-26 MED ORDER — SODIUM CHLORIDE 0.9 % IJ SOLN
3.0000 mL | INTRAMUSCULAR | Status: DC | PRN
  Administered 2013-03-30: 3 mL via INTRAVENOUS

## 2013-03-26 MED ORDER — VANCOMYCIN HCL IN DEXTROSE 750-5 MG/150ML-% IV SOLN
750.0000 mg | Freq: Two times a day (BID) | INTRAVENOUS | Status: DC
  Administered 2013-03-27 – 2013-03-28 (×3): 750 mg via INTRAVENOUS
  Filled 2013-03-26 (×4): qty 150

## 2013-03-26 MED ORDER — DILTIAZEM HCL 25 MG/5ML IV SOLN
10.0000 mg | Freq: Once | INTRAVENOUS | Status: AC
  Administered 2013-03-26: 10 mg via INTRAVENOUS
  Filled 2013-03-26: qty 5

## 2013-03-26 MED ORDER — INFLUENZA VAC SPLIT QUAD 0.5 ML IM SUSP
0.5000 mL | INTRAMUSCULAR | Status: AC
  Filled 2013-03-26 (×2): qty 0.5

## 2013-03-26 MED ORDER — SODIUM CHLORIDE 0.9 % IV SOLN
INTRAVENOUS | Status: DC
  Administered 2013-03-27 (×2): via INTRAVENOUS

## 2013-03-26 MED ORDER — DILTIAZEM HCL 100 MG IV SOLR
5.0000 mg/h | INTRAVENOUS | Status: DC
  Administered 2013-03-26: 5 mg/h via INTRAVENOUS
  Administered 2013-03-27: 15 mg/h via INTRAVENOUS
  Administered 2013-03-28 (×2): 10 mg/h via INTRAVENOUS
  Filled 2013-03-26 (×7): qty 100

## 2013-03-26 MED ORDER — DONEPEZIL HCL 10 MG PO TABS
10.0000 mg | ORAL_TABLET | Freq: Every day | ORAL | Status: DC
  Administered 2013-03-26 – 2013-03-30 (×5): 10 mg via ORAL
  Filled 2013-03-26 (×7): qty 1

## 2013-03-26 MED ORDER — ACETAMINOPHEN 500 MG PO TABS
1000.0000 mg | ORAL_TABLET | Freq: Once | ORAL | Status: AC
  Administered 2013-03-26: 1000 mg via ORAL
  Filled 2013-03-26: qty 2

## 2013-03-26 MED ORDER — ASPIRIN EC 81 MG PO TBEC
81.0000 mg | DELAYED_RELEASE_TABLET | Freq: Every day | ORAL | Status: DC
  Administered 2013-03-27 – 2013-03-31 (×5): 81 mg via ORAL
  Filled 2013-03-26 (×5): qty 1

## 2013-03-26 MED ORDER — DILTIAZEM HCL 100 MG IV SOLR
5.0000 mg/h | INTRAVENOUS | Status: DC
  Administered 2013-03-26: 5 mg/h via INTRAVENOUS
  Filled 2013-03-26: qty 100

## 2013-03-26 NOTE — ED Notes (Signed)
Report from [facility]> pt reports increased weakness and decreased appetite since Thursday.  Wife reports pt has been lying in bed since Thursday.  History of afib.  Denies known fever.  Pt denies pain. Alert and oriented.

## 2013-03-26 NOTE — H&P (Signed)
PCP:   Katy Apo, MD   Chief Complaint:  fever  HPI: 79 yo male h/o nicm ef 30%, cafib on anticoagulation, htn, mild dementia comes in with several days of not feeling well, laying in bed a lot, coughing, started with fever today, wife made him come to hospital.  No n/v/d.  He quit taking all of his medications one week ago because he says "he didn't want to pay the copay".  He denies any cp.  Is always sob and this is no different.  Denies abd pain or dysuria.  Found to have temp over 103, HR 180s on arrival.    Review of Systems:  Positive and negative as per HPI otherwise all other systems are negative  Past Medical History: Past Medical History  Diagnosis Date  . GERD (gastroesophageal reflux disease)   . CHF (congestive heart failure)   . Dysrhythmia     atrial fibrilation  . COPD (chronic obstructive pulmonary disease)   . Arthritis    Past Surgical History  Procedure Laterality Date  . Hernia repair    . Tonsillectomy      Medications: Prior to Admission medications   Medication Sig Start Date End Date Taking? Authorizing Provider  apixaban (ELIQUIS) 5 MG TABS tablet Take 1 tablet (5 mg total) by mouth 2 (two) times daily. 10/09/12  Yes Jacques Navy, MD  aspirin EC 81 MG tablet Take 81 mg by mouth daily.   Yes Historical Provider, MD  digoxin (LANOXIN) 0.125 MG tablet Take 1 tablet (0.125 mg total) by mouth daily. 10/09/12  Yes Jacques Navy, MD  donepezil (ARICEPT) 10 MG tablet Take 1 tablet (10 mg total) by mouth at bedtime. 10/09/12  Yes Jacques Navy, MD  furosemide (LASIX) 40 MG tablet Take 1 tablet (40 mg total) by mouth daily. 10/09/12  Yes Jacques Navy, MD  levothyroxine (SYNTHROID, LEVOTHROID) 75 MCG tablet Take 1 tablet (75 mcg total) by mouth daily before breakfast. 10/09/12  Yes Jacques Navy, MD  metoprolol succinate (TOPROL-XL) 200 MG 24 hr tablet Take 1 tablet (200 mg total) by mouth daily. Take with or immediately following a meal.  10/09/12  Yes Jacques Navy, MD  Multiple Vitamins-Minerals (MULTIVITAMIN WITH MINERALS) tablet Take 1 tablet by mouth daily.   Yes Historical Provider, MD  omeprazole (PRILOSEC OTC) 20 MG tablet Take 20 mg by mouth daily.   Yes Historical Provider, MD  OVER THE COUNTER MEDICATION Take 1 tablet by mouth daily. Nutritional supplement - does not remember name   Yes Historical Provider, MD  diltiazem (CARDIZEM CD) 180 MG 24 hr capsule Take 1 capsule (180 mg total) by mouth daily. 10/09/12   Jacques Navy, MD    Allergies:  No Known Allergies  Social History:  reports that he quit smoking about 51 years ago. His smoking use included Cigarettes. He has a 80 pack-year smoking history. He has never used smokeless tobacco. He reports that he drinks about 1.2 ounces of alcohol per week. He reports that he does not use illicit drugs.  Family History: Family History  Problem Relation Age of Onset  . Heart attack Mother   . Pulmonary embolism Father     Physical Exam: Filed Vitals:   03/26/13 2015 03/26/13 2030 03/26/13 2045 03/26/13 2100  BP: 106/73 103/77 99/62 99/65   Pulse: 54 88 30 54  Temp:      TempSrc:      Resp: 22 17 25 14   SpO2: 96% 93%  99% 99%   General appearance: alert, cooperative and no distress Head: Normocephalic, without obvious abnormality, atraumatic Eyes: negative Nose: Nares normal. Septum midline. Mucosa normal. No drainage or sinus tenderness. Neck: no JVD and supple, symmetrical, trachea midline Lungs: rhonchi RLL Heart: irregularly irregular rhythm Abdomen: soft, non-tender; bowel sounds normal; no masses,  no organomegaly Extremities: extremities normal, atraumatic, no cyanosis or edema Pulses: 2+ and symmetric Skin: Skin color, texture, turgor normal. No rashes or lesions Neurologic: Grossly normal    Labs on Admission:   Recent Labs  03/26/13 1934  NA 142  K 3.9  CL 107  CO2 24  GLUCOSE 116*  BUN 21  CREATININE 1.12  CALCIUM 8.5     Recent Labs  03/26/13 1934  WBC 10.5  HGB 12.4*  HCT 36.8*  MCV 106.1*  PLT 132*    Radiological Exams on Admission:  Dg Chest Port 1 View  03/26/2013   CLINICAL DATA:  Weakness and confusion.  EXAM: PORTABLE CHEST - 1 VIEW  COMPARISON:  10/05/2012.  FINDINGS: The heart is borderline enlarged but stable. The mediastinal and hilar contours are within normal limits and unchanged. Chronic changes of pulmonary fibrosis. No acute overlying pulmonary process. No pleural effusion. The bony thorax is intact.  IMPRESSION: Chronic lung changes/pulmonary fibrosis without acute overlying pulmonary process.   Electronically Signed   By: Loralie Champagne M.D.   On: 03/26/2013 19:28    Assessment/Plan  79 yo male with afib with rvr, early sepsis probably pulmonary source with h/o nicm ef 30% Principal Problem:   Sepsis Active Problems:   PULMONARY FIBROSIS, POSTINFLAMMATORY   Atrial fibrillation with RVR   FUO (fever of unknown origin)   NICM (nonischemic cardiomyopathy) EF 30%   Chronic systolic CHF (congestive heart failure)  Place on iv vanco/zosyn.  Urine and blood cx done.  Repeat cxr in couple of days.  Has gotten 2 Liters ivf bolus in ED, on cardizem gtt.  Will cont card gtt as bp allows.  Repeat lactic acid level.  Consider dig load if rate does not improve.  Place in stepdown unit.  sbp starting to drop on card gtt, map around 65.  Called [facility] for second opinion.  Has received 3rd liter of ivf, hesitant to bolus any further due to abnormal systolic function.    Tyrez Berrios A 03/26/2013, 9:12 PM

## 2013-03-26 NOTE — ED Provider Notes (Signed)
CSN: [id number]     Arrival date & time 03/26/13  1834 History   First MD Initiated Contact with Patient 03/26/13 1839     No chief complaint on file.  (Consider location/radiation/quality/duration/timing/severity/associated sxs/prior Treatment) HPI Comments: Patient hasn't come out of his room in the past 24 hours. Wife states cough, not feeling well for past 2 days. Patient denies taking any of his medications today. He is alert and oriented.  Patient is a 79 y.o. male presenting with weakness. The history is provided by the patient.  Weakness This is a new problem. The current episode started more than 2 days ago. The problem occurs constantly. The problem has been gradually worsening. Pertinent negatives include no chest pain, no abdominal pain, no headaches and no shortness of breath. Nothing aggravates the symptoms. Nothing relieves the symptoms. He has tried nothing for the symptoms. The treatment provided no relief.    Past Medical History  Diagnosis Date  . GERD (gastroesophageal reflux disease)   . CHF (congestive heart failure)   . Dysrhythmia     atrial fibrilation  . COPD (chronic obstructive pulmonary disease)   . Arthritis    Past Surgical History  Procedure Laterality Date  . Hernia repair    . Tonsillectomy     Family History  Problem Relation Age of Onset  . Heart attack Mother   . Pulmonary embolism Father    History  Substance Use Topics  . Smoking status: Former Smoker -- 2.00 packs/day for 40 years    Types: Cigarettes    Quit date: 06/29/1961  . Smokeless tobacco: Never Used  . Alcohol Use: 1.2 oz/week    2 Glasses of wine per week     Comment: occasional    Review of Systems  Constitutional: Negative for fever and chills.  Respiratory: Negative for shortness of breath.   Cardiovascular: Negative for chest pain.  Gastrointestinal: Negative for abdominal pain.  Neurological: Positive for weakness. Negative for headaches.  All other systems  reviewed and are negative.    Allergies  Review of patient's allergies indicates no known allergies.  Home Medications   Current Outpatient Rx  Name  Route  Sig  Dispense  Refill  . apixaban (ELIQUIS) 5 MG TABS tablet   Oral   Take 1 tablet (5 mg total) by mouth 2 (two) times daily.   60 tablet   5   . aspirin EC 81 MG tablet   Oral   Take 81 mg by mouth daily.         . digoxin (LANOXIN) 0.125 MG tablet   Oral   Take 1 tablet (0.125 mg total) by mouth daily.   30 tablet   5   . diltiazem (CARDIZEM CD) 180 MG 24 hr capsule   Oral   Take 1 capsule (180 mg total) by mouth daily.   30 capsule   5   . donepezil (ARICEPT) 10 MG tablet   Oral   Take 1 tablet (10 mg total) by mouth at bedtime.   30 tablet   5   . furosemide (LASIX) 40 MG tablet   Oral   Take 1 tablet (40 mg total) by mouth daily.   30 tablet   5   . levothyroxine (SYNTHROID, LEVOTHROID) 75 MCG tablet   Oral   Take 1 tablet (75 mcg total) by mouth daily before breakfast.   30 tablet   5   . metoprolol succinate (TOPROL-XL) 200 MG 24 hr tablet  Oral   Take 1 tablet (200 mg total) by mouth daily. Take with or immediately following a meal.   30 tablet   5   . Multiple Vitamins-Minerals (MULTIVITAMIN WITH MINERALS) tablet   Oral   Take 1 tablet by mouth daily.         Marland Kitchen omeprazole (PRILOSEC OTC) 20 MG tablet   Oral   Take 20 mg by mouth daily.         Marland Kitchen OVER THE COUNTER MEDICATION   Oral   Take 1 tablet by mouth daily. Nutritional supplement - does not remember name          There were no vitals taken for this visit. Physical Exam  Constitutional: He is oriented to person, place, and time. He appears well-developed and well-nourished. No distress.  HENT:  Head: Normocephalic and atraumatic.  Mouth/Throat: No oropharyngeal exudate.  Eyes: EOM are normal. Pupils are equal, round, and reactive to light.  Neck: Normal range of motion. Neck supple.  Cardiovascular: An irregularly  irregular rhythm present. Tachycardia present.  Exam reveals no friction rub.   No murmur heard. Pulmonary/Chest: Effort normal and breath sounds normal. No respiratory distress. He has no wheezes. He has no rales.  Abdominal: He exhibits no distension. There is no tenderness. There is no rebound.  Musculoskeletal: Normal range of motion. He exhibits no edema.  Neurological: He is alert and oriented to person, place, and time. No cranial nerve deficit. He exhibits normal muscle tone. Coordination normal.  Skin: He is not diaphoretic.    ED Course  Procedures (including critical care time) Labs Review Labs Reviewed  CBC - Abnormal; Notable for the following:    RBC 3.47 (*)    Hemoglobin 12.4 (*)    HCT 36.8 (*)    MCV 106.1 (*)    MCH 35.7 (*)    Platelets 132 (*)    All other components within normal limits  BASIC METABOLIC PANEL - Abnormal; Notable for the following:    Glucose, Bld 116 (*)    GFR calc non Af Amer 58 (*)    GFR calc Af Amer 68 (*)    All other components within normal limits  LACTIC ACID, PLASMA - Abnormal; Notable for the following:    Lactic Acid, Venous 2.3 (*)    All other components within normal limits  DIGOXIN LEVEL - Abnormal; Notable for the following:    Digoxin Level <0.3 (*)    All other components within normal limits  URINALYSIS, ROUTINE W REFLEX MICROSCOPIC - Abnormal; Notable for the following:    Color, Urine AMBER (*)    APPearance CLOUDY (*)    Bilirubin Urine SMALL (*)    Ketones, ur 15 (*)    Protein, ur 30 (*)    All other components within normal limits  PROTIME-INR - Abnormal; Notable for the following:    Prothrombin Time 16.9 (*)    All other components within normal limits  PRO B NATRIURETIC PEPTIDE - Abnormal; Notable for the following:    Pro B Natriuretic peptide (BNP) 4964.0 (*)    All other components within normal limits  URINE MICROSCOPIC-ADD ON - Abnormal; Notable for the following:    Bacteria, UA FEW (*)    All  other components within normal limits  URINE CULTURE  CULTURE, BLOOD (ROUTINE X 2)  CULTURE, BLOOD (ROUTINE X 2)  CULTURE, EXPECTORATED SPUTUM-ASSESSMENT  GRAM STAIN  MRSA PCR SCREENING  LEGIONELLA ANTIGEN, URINE  STREP PNEUMONIAE URINARY ANTIGEN  CBC WITH DIFFERENTIAL  BASIC METABOLIC PANEL  INFLUENZA PANEL BY PCR  POCT I-STAT TROPONIN I   Imaging Review Dg Chest Port 1 View  03/26/2013   CLINICAL DATA:  Weakness and confusion.  EXAM: PORTABLE CHEST - 1 VIEW  COMPARISON:  10/05/2012.  FINDINGS: The heart is borderline enlarged but stable. The mediastinal and hilar contours are within normal limits and unchanged. Chronic changes of pulmonary fibrosis. No acute overlying pulmonary process. No pleural effusion. The bony thorax is intact.  IMPRESSION: Chronic lung changes/pulmonary fibrosis without acute overlying pulmonary process.   Electronically Signed   By: Loralie Champagne M.D.   On: 03/26/2013 19:28     Date: 03/26/2013  Rate: 184  Rhythm: atrial fibrillation and with RVR  QRS Axis: normal  Intervals: irregular, QTc normal  ST/T Wave abnormalities: flipped T waves laterally  Conduction Disutrbances:none  Narrative Interpretation:   Old EKG Reviewed: changes noted and flipped T waves inferiorly new  CRITICAL CARE Performed by: Dagmar Hait   Total critical care time: 30 minutes  Critical care time was exclusive of separately billable procedures and treating other patients.  Critical care was necessary to treat or prevent imminent or life-threatening deterioration.  Critical care was time spent personally by me on the following activities: development of treatment plan with patient and/or surrogate as well as nursing, discussions with consultants, evaluation of patient's response to treatment, examination of patient, obtaining history from patient or surrogate, ordering and performing treatments and interventions, ordering and review of laboratory studies, ordering  and review of radiographic studies, pulse oximetry and re-evaluation of patient's condition.   MDM   1. Sepsis   2. Atrial fibrillation with RVR   3. Chronic systolic CHF (congestive heart failure)   4. FUO (fever of unknown origin)   5. NICM (nonischemic cardiomyopathy)   6. Postinflammatory pulmonary fibrosis    79 year old male brought in by his wife with EMS presents with malaise, tachycardia. Patient has been in his room and eating since Friday per the wife. He reports not taking any of his medications including his digoxin and diltiazem. Patient states this doesn't feel overly well. Wife also states some cough with mucus. Patient denies any cough, vomiting, chest pain, vomiting, abdominal pain. He is alert and oriented. Patient's initial heart rate down to between 160s over 90s. Initial blood pressure 116/80. Patient is in no acute distress. Initial EKG with Afib with RVR, diltiazem drip ordered with small bolus. Dig level ordered. Basic labs and cardiac labs also ordered. Initial rectal temp 103, blood cultures and tylenol ordered. Broad spectrum antibiotics given. Diltiazem with some help with his HR, not totally controlled, but improving. Dig level low, BNP elevated, however patient doesn't appear volume overloaded. CXR normal, no leukocytosis, no UTI. Unclear etiology of patient's infection. No altered mental status, no need for LP at this time. Admitted to [facility] unit.    Dagmar Hait, MD 03/26/13 [phone]

## 2013-03-26 NOTE — Progress Notes (Signed)
ANTIBIOTIC CONSULT NOTE - INITIAL  Pharmacy Consult for zosyn Indication: rule out pneumonia  No Known Allergies  Patient Measurements: Wt= 75kg (estimated)  Vital Signs: Temp: 103 F (39.4 C) (09/28 1900) Temp src: Rectal (09/28 1900) BP: 112/65 mmHg (09/28 1928) Pulse Rate: 124 (09/28 1928) Intake/Output from previous day:   Intake/Output from this shift:    Labs: No results found for this basename: WBC, HGB, PLT, LABCREA, CREATININE,  in the last 72 hours The CrCl is unknown because both a height and weight (above a minimum accepted value) are required for this calculation. No results found for this basename: VANCOTROUGH, VANCOPEAK, VANCORANDOM, GENTTROUGH, GENTPEAK, GENTRANDOM, TOBRATROUGH, TOBRAPEAK, TOBRARND, AMIKACINPEAK, AMIKACINTROU, AMIKACIN,  in the last 72 hours   Microbiology: No results found for this or any previous visit (from the past 720 hour(s)).  Medical History: Past Medical History  Diagnosis Date  . GERD (gastroesophageal reflux disease)   . CHF (congestive heart failure)   . Dysrhythmia     atrial fibrilation  . COPD (chronic obstructive pulmonary disease)   . Arthritis     Assessment: 79 yo male here with fever and afib and CODE Sepsis called. Patient to begin vancomycin/zosyn for r/o PNA. No labs available yet (SCr= 1.26-1.41 in 09/2012; CrCl~ 40-50).   Goal of Therapy:  Vancomycin trough level 15-20 mcg/ml  Plan:  -Vancomycin 1250mg  IV x1 followed by 750mg  IV q12h -Zosyn 3.75gm IV q8h -Will follow renal function, cultures and clinical progress  Harland German, Pharm D 03/26/2013 7:55 PM

## 2013-03-26 NOTE — ED Notes (Signed)
Attempted to call report x 1. Admitting MD at bedside.

## 2013-03-26 NOTE — ED Notes (Signed)
Level II code sepsis called

## 2013-03-26 NOTE — ED Notes (Signed)
Emergency Contacts: Wife, Nova Evett: [phone] [place], [place]: [phone]

## 2013-03-27 DIAGNOSIS — I959 Hypotension, unspecified: Secondary | ICD-10-CM

## 2013-03-27 DIAGNOSIS — R509 Fever, unspecified: Secondary | ICD-10-CM

## 2013-03-27 LAB — CBC WITH DIFFERENTIAL/PLATELET
Basophils Absolute: 0 10*3/uL (ref 0.0–0.1)
Basophils Relative: 0 % (ref 0–1)
Eosinophils Absolute: 0 10*3/uL (ref 0.0–0.7)
Eosinophils Relative: 0 % (ref 0–5)
HCT: 36.9 % — ABNORMAL LOW (ref 39.0–52.0)
Hemoglobin: 12.5 g/dL — ABNORMAL LOW (ref 13.0–17.0)
Lymphocytes Relative: 10 % — ABNORMAL LOW (ref 12–46)
Lymphs Abs: 1 10*3/uL (ref 0.7–4.0)
MCH: 36 pg — ABNORMAL HIGH (ref 26.0–34.0)
MCHC: 33.9 g/dL (ref 30.0–36.0)
MCV: 106.3 fL — ABNORMAL HIGH (ref 78.0–100.0)
Monocytes Absolute: 0.9 10*3/uL (ref 0.1–1.0)
Monocytes Relative: 10 % (ref 3–12)
Neutro Abs: 7.6 10*3/uL (ref 1.7–7.7)
Neutrophils Relative %: 80 % — ABNORMAL HIGH (ref 43–77)
Platelets: 126 10*3/uL — ABNORMAL LOW (ref 150–400)
RBC: 3.47 MIL/uL — ABNORMAL LOW (ref 4.22–5.81)
RDW: 14 % (ref 11.5–15.5)
WBC: 9.5 10*3/uL (ref 4.0–10.5)

## 2013-03-27 LAB — BASIC METABOLIC PANEL
BUN: 20 mg/dL (ref 6–23)
CO2: 19 mEq/L (ref 19–32)
Calcium: 8.2 mg/dL — ABNORMAL LOW (ref 8.4–10.5)
Creatinine, Ser: 0.99 mg/dL (ref 0.50–1.35)
GFR calc Af Amer: 85 mL/min — ABNORMAL LOW (ref >90)
GFR calc non Af Amer: 73 mL/min — ABNORMAL LOW (ref >90)
Sodium: 141 mEq/L (ref 135–145)

## 2013-03-27 LAB — STREP PNEUMONIAE URINARY ANTIGEN: Strep Pneumo Urinary Antigen: NEGATIVE

## 2013-03-27 LAB — LEGIONELLA ANTIGEN, URINE: Legionella Antigen, Urine: NEGATIVE

## 2013-03-27 LAB — INFLUENZA PANEL BY PCR (TYPE A & B)
Influenza A By PCR: NEGATIVE
Influenza B By PCR: NEGATIVE

## 2013-03-27 LAB — MRSA PCR SCREENING: MRSA by PCR: NEGATIVE

## 2013-03-27 LAB — TSH: TSH: 3.995 u[IU]/mL (ref 0.350–4.500)

## 2013-03-27 MED ORDER — OSELTAMIVIR PHOSPHATE 75 MG PO CAPS
75.0000 mg | ORAL_CAPSULE | Freq: Two times a day (BID) | ORAL | Status: DC
  Administered 2013-03-27: 75 mg via ORAL
  Filled 2013-03-27 (×3): qty 1

## 2013-03-27 NOTE — Consult Note (Signed)
Name: Cameron [name] MRN: [id number] DOB: February 05, 1926    LOS: 1  Referring Provider:  Dr. Onalee Hua Reason for Referral:  Hypotension  PULMONARY / CRITICAL CARE MEDICINE  HPI:  Cameron [name] is an 79 y/o man with past medical history listed below.  He was admitted with 3 days of not feeling well and one day of fever.  He has been off of his home medications including metoprolol ER 200mg  daily and Diltiazem 180mg  daily for about a week.  He was found to have a fever of 103 on presentation.  He was also found to be in afib with RVR and was started on a diltiazem drip.  PCCM was consulted when his blood pressure was low, after he had been bolused 2L NS.  Past Medical History  Diagnosis Date  . GERD (gastroesophageal reflux disease)   . CHF (congestive heart failure)   . Dysrhythmia     atrial fibrilation  . COPD (chronic obstructive pulmonary disease)   . Arthritis    Past Surgical History  Procedure Laterality Date  . Hernia repair    . Tonsillectomy    . Hemmroidectmy     Prior to Admission medications   Medication Sig Start Date End Date Taking? Authorizing Provider  apixaban (ELIQUIS) 5 MG TABS tablet Take 1 tablet (5 mg total) by mouth 2 (two) times daily. 10/09/12  Yes Jacques Navy, MD  aspirin EC 81 MG tablet Take 81 mg by mouth daily.   Yes Historical Provider, MD  digoxin (LANOXIN) 0.125 MG tablet Take 1 tablet (0.125 mg total) by mouth daily. 10/09/12  Yes Jacques Navy, MD  donepezil (ARICEPT) 10 MG tablet Take 1 tablet (10 mg total) by mouth at bedtime. 10/09/12  Yes Jacques Navy, MD  furosemide (LASIX) 40 MG tablet Take 1 tablet (40 mg total) by mouth daily. 10/09/12  Yes Jacques Navy, MD  levothyroxine (SYNTHROID, LEVOTHROID) 75 MCG tablet Take 1 tablet (75 mcg total) by mouth daily before breakfast. 10/09/12  Yes Jacques Navy, MD  metoprolol succinate (TOPROL-XL) 200 MG 24 hr tablet Take 1 tablet (200 mg total) by mouth daily. Take with or immediately following a  meal. 10/09/12  Yes Jacques Navy, MD  Multiple Vitamins-Minerals (MULTIVITAMIN WITH MINERALS) tablet Take 1 tablet by mouth daily.   Yes Historical Provider, MD  omeprazole (PRILOSEC OTC) 20 MG tablet Take 20 mg by mouth daily.   Yes Historical Provider, MD  OVER THE COUNTER MEDICATION Take 1 tablet by mouth daily. Nutritional supplement - does not remember name   Yes Historical Provider, MD  diltiazem (CARDIZEM CD) 180 MG 24 hr capsule Take 1 capsule (180 mg total) by mouth daily. 10/09/12   Jacques Navy, MD   Allergies No Known Allergies  Family History Family History  Problem Relation Age of Onset  . Heart attack Mother   . Pulmonary embolism Father    Social History  reports that he quit smoking about 51 years ago. His smoking use included Cigarettes. He has a 80 pack-year smoking history. He has never used smokeless tobacco. He reports that he drinks about 1.2 ounces of alcohol per week. He reports that he does not use illicit drugs.  Review Of Systems:  A review of 14 systems was negative except as stated in the HPI.   Brief patient description:  79 y/o with febrile illness and afib with RVR  Events Since Admission:   Current Status:  Vital Signs: Temp:  [98.3  F (36.8 C)-103 F (39.4 C)] 98.3 F (36.8 C) (09/29 0300) Pulse Rate:  [30-161] 94 (09/29 0500) Resp:  [10-37] 23 (09/29 0500) BP: (87-127)/(49-93) 103/70 mmHg (09/29 0500) SpO2:  [87 %-99 %] 93 % (09/29 0500) Weight:  [66.9 kg (147 lb 7.8 oz)] 66.9 kg (147 lb 7.8 oz) (09/28 2210)  Physical Examination: General:  Laying in bed in NAD Neuro:  Alert and oriented x3 HEENT:  PERRL, EOMI, OP clear Neck:  Supple, no JVD Cardiovascular:  NRRR, no mrg Lungs:  CTAB, no wrr Abdomen:  Soft, NTND, +BS, no HSM Musculoskeletal:  No edema, no clubbing Skin:  No rash or other lesions.   Principal Problem:   Sepsis Active Problems:   PULMONARY FIBROSIS, POSTINFLAMMATORY   Atrial fibrillation with RVR   FUO  (fever of unknown origin)   NICM (nonischemic cardiomyopathy) EF 30%   Chronic systolic CHF (congestive heart failure)   ASSESSMENT AND PLAN  CXR with no evidence of focal pneumonia.  Lungs clear and pt satting well on room air.   1. A fib with RVR - would start pt back on oral diltiazem, could start with 30mg  q6-8 and titrate back up to home dose for rate control.  Wean drip as able. 2. Fever - RVP ordered and pt started on tamiflu pending results. 3. Hypotension - No evidence of volume overload currently.  Pt likely with decreased intake over the last few days as well as increased insensible losses with fever.  Ok to continue bolusing as needed unless he develops crackles or an oxygen requirement.    I spent 35 minutes of critical care time in the care of this patient seperate from procedures which are documented elsewhere.  Carolan Clines., M.D. Pulmonary and Critical Care Medicine [facility] Pager: [phone]  03/27/2013, 6:21 AM

## 2013-03-27 NOTE — Progress Notes (Signed)
Subjective: Admission H&P reviewed patient minute was of fever, malaise. Chest x-ray negative for pneumonia. Patient required multiple IV fluid boluses for hypotension and treatment of A. Fib with RVR. At this time he states he feels comfortable in at his baseline, Patient still with rhonchorous cough, productive of colored mucus. He denies shortness of breath. He does admit to just stop taking his medications. His by mouth intake has been poor at times at home.   Objective: Vital signs in last 24 hours: Temp:  [98.3 F (36.8 C)-103 F (39.4 C)] 98.6 F (37 C) (09/29 0700) Pulse Rate:  [30-161] 87 (09/29 1200) Resp:  [10-37] 21 (09/29 1200) BP: (87-127)/(49-93) 102/62 mmHg (09/29 1200) SpO2:  [87 %-99 %] 94 % (09/29 1200) Weight:  [66.9 kg (147 lb 7.8 oz)] 66.9 kg (147 lb 7.8 oz) (09/28 2210) Weight change:  Last BM Date: 03/25/13  Intake/Output from previous day: 09/28 0701 - 09/29 0700 In: 2790 [P.O.:120; I.V.:2670] Out: 250 [Urine:250] Intake/Output this shift: Total I/O In: 1605 [P.O.:600; I.V.:805; IV Piggyback:200] Out: -   General appearance: alert and cooperative Resp: rhonchi in the right base Cardio: irregularly irregular rhythm Extremities: extremities normal, atraumatic, no cyanosis or edema  Lab Results:  Results for orders placed during the hospital encounter of 03/26/13 (from the past 24 hour(s))  CBC     Status: Abnormal   Collection Time    03/26/13  7:34 PM      Result Value Range   WBC 10.5  4.0 - 10.5 K/uL   RBC 3.47 (*) 4.22 - 5.81 MIL/uL   Hemoglobin 12.4 (*) 13.0 - 17.0 g/dL   HCT 41.3 (*) 24.4 - 01.0 %   MCV 106.1 (*) 78.0 - 100.0 fL   MCH 35.7 (*) 26.0 - 34.0 pg   MCHC 33.7  30.0 - 36.0 g/dL   RDW 27.2  53.6 - 64.4 %   Platelets 132 (*) 150 - 400 K/uL  BASIC METABOLIC PANEL     Status: Abnormal   Collection Time    03/26/13  7:34 PM      Result Value Range   Sodium 142  135 - 145 mEq/L   Potassium 3.9  3.5 - 5.1 mEq/L   Chloride 107  96 -  112 mEq/L   CO2 24  19 - 32 mEq/L   Glucose, Bld 116 (*) 70 - 99 mg/dL   BUN 21  6 - 23 mg/dL   Creatinine, Ser 0.34  0.50 - 1.35 mg/dL   Calcium 8.5  8.4 - 74.2 mg/dL   GFR calc non Af Amer 58 (*) >90 mL/min   GFR calc Af Amer 68 (*) >90 mL/min  LACTIC ACID, PLASMA     Status: Abnormal   Collection Time    03/26/13  7:34 PM      Result Value Range   Lactic Acid, Venous 2.3 (*) 0.5 - 2.2 mmol/L  DIGOXIN LEVEL     Status: Abnormal   Collection Time    03/26/13  7:34 PM      Result Value Range   Digoxin Level <0.3 (*) 0.8 - 2.0 ng/mL  PROTIME-INR     Status: Abnormal   Collection Time    03/26/13  7:34 PM      Result Value Range   Prothrombin Time 16.9 (*) 11.6 - 15.2 seconds   INR 1.41  0.00 - 1.49  PRO B NATRIURETIC PEPTIDE     Status: Abnormal   Collection Time    03/26/13  7:34 PM      Result Value Range   Pro B Natriuretic peptide (BNP) 4964.0 (*) 0 - 450 pg/mL  POCT I-STAT TROPONIN I     Status: None   Collection Time    03/26/13  7:47 PM      Result Value Range   Troponin i, poc 0.01  0.00 - 0.08 ng/mL   Comment 3           URINALYSIS, ROUTINE W REFLEX MICROSCOPIC     Status: Abnormal   Collection Time    03/26/13  7:55 PM      Result Value Range   Color, Urine AMBER (*) YELLOW   APPearance CLOUDY (*) CLEAR   Specific Gravity, Urine 1.023  1.005 - 1.030   pH 5.5  5.0 - 8.0   Glucose, UA NEGATIVE  NEGATIVE mg/dL   Hgb urine dipstick NEGATIVE  NEGATIVE   Bilirubin Urine SMALL (*) NEGATIVE   Ketones, ur 15 (*) NEGATIVE mg/dL   Protein, ur 30 (*) NEGATIVE mg/dL   Urobilinogen, UA 1.0  0.0 - 1.0 mg/dL   Nitrite NEGATIVE  NEGATIVE   Leukocytes, UA NEGATIVE  NEGATIVE  URINE MICROSCOPIC-ADD ON     Status: Abnormal   Collection Time    03/26/13  7:55 PM      Result Value Range   Squamous Epithelial / LPF RARE  RARE   WBC, UA 0-2  <3 WBC/hpf   Bacteria, UA FEW (*) RARE   Urine-Other MUCOUS PRESENT    INFLUENZA PANEL BY PCR     Status: None   Collection Time     03/26/13 11:16 PM      Result Value Range   Influenza A By PCR NEGATIVE  NEGATIVE   Influenza B By PCR NEGATIVE  NEGATIVE   H1N1 flu by pcr NOT DETECTED  NOT DETECTED  MRSA PCR SCREENING     Status: None   Collection Time    03/26/13 11:17 PM      Result Value Range   MRSA by PCR NEGATIVE  NEGATIVE  STREP PNEUMONIAE URINARY ANTIGEN     Status: None   Collection Time    03/26/13 11:42 PM      Result Value Range   Strep Pneumo Urinary Antigen NEGATIVE  NEGATIVE  CULTURE, EXPECTORATED SPUTUM-ASSESSMENT     Status: None   Collection Time    03/27/13  3:07 AM      Result Value Range   Specimen Description SPUTUM     Special Requests NONE     Sputum evaluation       Value: THIS SPECIMEN IS ACCEPTABLE. RESPIRATORY CULTURE REPORT TO FOLLOW.   Report Status 03/27/2013 FINAL    CBC WITH DIFFERENTIAL     Status: Abnormal   Collection Time    03/27/13  4:37 AM      Result Value Range   WBC 9.5  4.0 - 10.5 K/uL   RBC 3.47 (*) 4.22 - 5.81 MIL/uL   Hemoglobin 12.5 (*) 13.0 - 17.0 g/dL   HCT 16.1 (*) 09.6 - 04.5 %   MCV 106.3 (*) 78.0 - 100.0 fL   MCH 36.0 (*) 26.0 - 34.0 pg   MCHC 33.9  30.0 - 36.0 g/dL   RDW 40.9  81.1 - 91.4 %   Platelets 126 (*) 150 - 400 K/uL   Neutrophils Relative % 80 (*) 43 - 77 %   Neutro Abs 7.6  1.7 - 7.7 K/uL   Lymphocytes Relative  10 (*) 12 - 46 %   Lymphs Abs 1.0  0.7 - 4.0 K/uL   Monocytes Relative 10  3 - 12 %   Monocytes Absolute 0.9  0.1 - 1.0 K/uL   Eosinophils Relative 0  0 - 5 %   Eosinophils Absolute 0.0  0.0 - 0.7 K/uL   Basophils Relative 0  0 - 1 %   Basophils Absolute 0.0  0.0 - 0.1 K/uL  BASIC METABOLIC PANEL     Status: Abnormal   Collection Time    03/27/13  4:37 AM      Result Value Range   Sodium 141  135 - 145 mEq/L   Potassium 3.7  3.5 - 5.1 mEq/L   Chloride 108  96 - 112 mEq/L   CO2 19  19 - 32 mEq/L   Glucose, Bld 98  70 - 99 mg/dL   BUN 20  6 - 23 mg/dL   Creatinine, Ser 4.09  0.50 - 1.35 mg/dL   Calcium 8.2 (*) 8.4 - 10.5  mg/dL   GFR calc non Af Amer 73 (*) >90 mL/min   GFR calc Af Amer 85 (*) >90 mL/min      Studies/Results: Dg Chest Port 1 View  03/26/2013   CLINICAL DATA:  Weakness and confusion.  EXAM: PORTABLE CHEST - 1 VIEW  COMPARISON:  10/05/2012.  FINDINGS: The heart is borderline enlarged but stable. The mediastinal and hilar contours are within normal limits and unchanged. Chronic changes of pulmonary fibrosis. No acute overlying pulmonary process. No pleural effusion. The bony thorax is intact.  IMPRESSION: Chronic lung changes/pulmonary fibrosis without acute overlying pulmonary process.   Electronically Signed   By: Loralie Champagne M.D.   On: 03/26/2013 19:28    Medications:  Prior to Admission:  Prescriptions prior to admission  Medication Sig Dispense Refill  . apixaban (ELIQUIS) 5 MG TABS tablet Take 1 tablet (5 mg total) by mouth 2 (two) times daily.  60 tablet  5  . aspirin EC 81 MG tablet Take 81 mg by mouth daily.      . digoxin (LANOXIN) 0.125 MG tablet Take 1 tablet (0.125 mg total) by mouth daily.  30 tablet  5  . donepezil (ARICEPT) 10 MG tablet Take 1 tablet (10 mg total) by mouth at bedtime.  30 tablet  5  . furosemide (LASIX) 40 MG tablet Take 1 tablet (40 mg total) by mouth daily.  30 tablet  5  . levothyroxine (SYNTHROID, LEVOTHROID) 75 MCG tablet Take 1 tablet (75 mcg total) by mouth daily before breakfast.  30 tablet  5  . metoprolol succinate (TOPROL-XL) 200 MG 24 hr tablet Take 1 tablet (200 mg total) by mouth daily. Take with or immediately following a meal.  30 tablet  5  . Multiple Vitamins-Minerals (MULTIVITAMIN WITH MINERALS) tablet Take 1 tablet by mouth daily.      Marland Kitchen omeprazole (PRILOSEC OTC) 20 MG tablet Take 20 mg by mouth daily.      Marland Kitchen OVER THE COUNTER MEDICATION Take 1 tablet by mouth daily. Nutritional supplement - does not remember name      . diltiazem (CARDIZEM CD) 180 MG 24 hr capsule Take 1 capsule (180 mg total) by mouth daily.  30 capsule  5    Scheduled: . apixaban  5 mg Oral BID  . aspirin EC  81 mg Oral Daily  . digoxin  0.125 mg Oral Daily  . donepezil  10 mg Oral QHS  . influenza vac  split quadrivalent PF  0.5 mL Intramuscular Tomorrow-1000  . levothyroxine  75 mcg Oral QAC breakfast  . oseltamivir  75 mg Oral BID  . piperacillin-tazobactam (ZOSYN)  IV  3.375 g Intravenous Q8H  . sodium chloride  3 mL Intravenous Q12H  . vancomycin  750 mg Intravenous Q12H   Continuous: . sodium chloride 100 mL/hr at 03/27/13 1046  . diltiazem (CARDIZEM) infusion 15 mg/hr (03/27/13 1233)   ZOX:WRUEAV chloride, sodium chloride  Assessment/Plan: Elderly male presented to the hospital with complaint of malaise, fever productive cough. Despite negative x-ray he does have rhonchi in the base suspect he does have pneumonia, he is empirically on Tamiflu. Continue broad-spectrum antibiotics, check followup x-ray  after IV fluids . He does have a history of CHF therefore we will try to avoid volume overload.  Atrial fibrillation with rapid ventricular response rate better controlled, continue Cardizem, and anticoagulation Eliquis  for now. At home patient did require Cardizem and metoprolol and digoxin.  Nonischemic cardiomyopathy, last echocardiogram in May in the office showed EF of 55-60%, dramatically improved from previously  Hypothyroidism  Memory loss, outpatient treated with Aricept  LOS: 1 day   Cameron [name] D 03/27/2013, 12:37 PM

## 2013-03-27 NOTE — Care Management Note (Signed)
    Page 1 of 1   03/30/2013     12:52:25 PM   CARE MANAGEMENT NOTE 03/30/2013  Patient:  Cameron [name], Cameron [name]   Account Number:  [id number]  Date Initiated:  03/27/2013  Documentation initiated by:  Donn Pierini  Subjective/Objective Assessment:   Pt admitted with afib ?sepsis     Action/Plan:   PTA pt lived at home with spouse- NCM to follow for d/c needs- may benefit from [facility] RN for medication management if pt agreeable   Anticipated DC Date:  03/29/2013   Anticipated DC Plan:  HOME W HOME HEALTH SERVICES      DC Planning Services  CM consult      Choice offered to / List presented to:             Status of service:  In process, will continue to follow Medicare Important Message given?   (If response is "NO", the following Medicare IM given date fields will be blank) Date Medicare IM given:   Date Additional Medicare IM given:    Discharge Disposition:    Per UR Regulation:  Reviewed for med. necessity/level of care/duration of stay  If discussed at Long Length of Stay Meetings, dates discussed:    Comments:  03/30/13- 1220- Donn Pierini RN, BSN [phone] Spoke with pt and wife at bedside regarding d/c needs- both are agreeable to [facility] and list of HH agencies for [facility] given to them- per choice they would like to use [place] as long as insurance accepts and if not then [facility]. Will contact Corrie Dandy with [facility] regarding referral. Per wife she needs pt to take his meds at home so would benefit both from HH-PT and RN (for medication management) also a CSW may be helpful for resources for wife if pt becomes difficult to handle at home. Per wife she has a RW at home that pt can use and they do not feel like pt will need a 3n1. MD please order [facility]- RN/PT/CSW for d/c -  03/28/13- 1200- Donn Pierini RN, BSN [phone] Spoke with pt at bedside regarding potential d/c needs and PT recommendations- per conversation pt does not remember working with PT - states that he does not have a RW  at home and has never used [facility]- he does report that his wife has had services in past- pt is agreeable for this CM to follow up for d/c needs prior to pt's discharge- will try to come back when wife is present to discuss needs.

## 2013-03-27 NOTE — Progress Notes (Signed)
Utilization review completed.  

## 2013-03-27 NOTE — Plan of Care (Signed)
Problem: Phase I Progression Outcomes Goal: Pain controlled with appropriate interventions Outcome: Progressing Pt denies pain at this time Goal: Voiding-avoid urinary catheter unless indicated Outcome: Progressing Pt is voiding

## 2013-03-27 NOTE — Progress Notes (Signed)
Physical Therapy Evaluation Patient Details Name: PERSHING SKIDMORE MRN: [id number] DOB: 1925-10-18 Today's Date: 03/27/2013 Time: 4098-1191 PT Time Calculation (min): 20 min  PT Assessment / Plan / Recommendation History of Present Illness  Pt admit with hypotension, fever of 103 degrees, afib with RVR and sepsis.  Clinical Impression  Pt admitted with above. Pt currently with functional limitations due to the deficits listed below (see PT Problem List). Should progress and be able to go home with family assist and receive HHPT.  Will need RW for safety.  Pt will benefit from skilled PT to increase their independence and safety with mobility to allow discharge to the venue listed below.     PT Assessment  Patient needs continued PT services    Follow Up Recommendations  Home health PT;Supervision/Assistance - 24 hour                Equipment Recommendations  Rolling walker with 5" wheels;3in1 (PT)         Frequency Min 3X/week    Precautions / Restrictions Precautions Precautions: Fall Restrictions Weight Bearing Restrictions: No   Pertinent Vitals/Pain VSS, no pain      Mobility  Bed Mobility Bed Mobility: Rolling Left;Left Sidelying to Sit;Sitting - Scoot to Edge of Bed Rolling Left: 5: Supervision Left Sidelying to Sit: 4: Min assist;HOB flat Sitting - Scoot to Edge of Bed: 4: Min assist Details for Bed Mobility Assistance: Pt needed cues for sequencing movement.   Transfers Transfers: Sit to Stand;Stand to Sit Sit to Stand: 1: +2 Total assist;With upper extremity assist;From bed Sit to Stand: Patient Percentage: 70% Stand to Sit: 4: Min assist;With upper extremity assist;With armrests;To chair/3-in-1 Details for Transfer Assistance: Needed cues for hand placement. Once up, pt stabilized by holding with bil HHA.   Ambulation/Gait Ambulation/Gait Assistance: 1: +2 Total assist Ambulation/Gait: Patient Percentage: 80% Ambulation Distance (Feet): 5 Feet Assistive  device: 2 person hand held assist Ambulation/Gait Assistance Details: Pt ambulated with bil HHA with ability to weight shift bil with some unsteadiness on feet noted.  Pt may need to use RW.  Pt declined ambulation a long distance as he stated he was "fatigued".   Gait Pattern: Step-to pattern;Decreased stride length;Decreased step length - right;Decreased step length - left;Shuffle;Ataxic;Trunk flexed;Wide base of support Gait velocity: decreased Stairs: No Wheelchair Mobility Wheelchair Mobility: No    Exercises General Exercises - Lower Extremity Ankle Circles/Pumps: AROM;Both;10 reps;Supine Long Arc Quad: AROM;Both;10 reps;Seated   PT Diagnosis: Generalized weakness  PT Problem List: Decreased activity tolerance;Decreased balance;Decreased mobility;Decreased coordination;Decreased knowledge of use of DME;Decreased safety awareness;Decreased knowledge of precautions PT Treatment Interventions: DME instruction;Gait training;Stair training;Functional mobility training;Therapeutic activities;Therapeutic exercise;Balance training;Patient/family education     PT Goals(Current goals can be found in the care plan section) Acute Rehab PT Goals Patient Stated Goal: to go home PT Goal Formulation: With patient Time For Goal Achievement: 04/10/13 Potential to Achieve Goals: Good  Visit Information  Last PT Received On: 03/27/13 Assistance Needed: +1 History of Present Illness: Pt admit with hypotension, fever of 103 degrees, afib with RVR and sepsis.       Prior Functioning  Home Living Family/patient expects to be discharged to:: Private residence Living Arrangements: Spouse/significant other Available Help at Discharge: Family;Available PRN/intermittently Type of Home: Apartment Home Access: Stairs to enter Entrance Stairs-Number of Steps: 3 Entrance Stairs-Rails: Right;Left Home Layout: One level;Able to live on main level with bedroom/bathroom Home Equipment: Gilmer Mor - single  point Prior Function Level of Independence: Independent Communication Communication:  HOH Dominant Hand: Right    Cognition  Cognition Arousal/Alertness: Awake/alert Behavior During Therapy: WFL for tasks assessed/performed Overall Cognitive Status: Within Functional Limits for tasks assessed    Extremity/Trunk Assessment Upper Extremity Assessment Upper Extremity Assessment: Defer to OT evaluation Lower Extremity Assessment Lower Extremity Assessment: Generalized weakness   Balance Balance Balance Assessed: Yes Static Standing Balance Static Standing - Balance Support: Bilateral upper extremity supported;During functional activity Static Standing - Level of Assistance: 4: Min assist Static Standing - Comment/# of Minutes: 3  End of Session PT - End of Session Equipment Utilized During Treatment: Gait belt Activity Tolerance: Patient limited by fatigue Patient left: in chair;with call bell/phone within reach Nurse Communication: Mobility status  [employer] Acute Rehabilitation [phone] (pager)      INGOLD,Azrielle Springsteen 03/27/2013, 2:31 PM

## 2013-03-27 NOTE — Progress Notes (Signed)
Fever improved. No distress. Remains tachycardic but BP improved. Reports thick yellow sputum  NAD HEENT WNL JVP not well visualized Minimal bibasilar crackles, no wheezes IRIR, tachy NABS Chronic stasis changes, minimal edema   BMET    Component Value Date/Time   NA 141 03/27/2013 0437   K 3.7 03/27/2013 0437   CL 108 03/27/2013 0437   CO2 19 03/27/2013 0437   GLUCOSE 98 03/27/2013 0437   BUN 20 03/27/2013 0437   CREATININE 0.99 03/27/2013 0437   CALCIUM 8.2* 03/27/2013 0437   GFRNONAA 73* 03/27/2013 0437   GFRAA 85* 03/27/2013 0437    CBC    Component Value Date/Time   WBC 9.5 03/27/2013 0437   RBC 3.47* 03/27/2013 0437   HGB 12.5* 03/27/2013 0437   HCT 36.9* 03/27/2013 0437   PLT 126* 03/27/2013 0437   MCV 106.3* 03/27/2013 0437   MCH 36.0* 03/27/2013 0437   MCHC 33.9 03/27/2013 0437   RDW 14.0 03/27/2013 0437   LYMPHSABS 1.0 03/27/2013 0437   MONOABS 0.9 03/27/2013 0437   EOSABS 0.0 03/27/2013 0437   BASOSABS 0.0 03/27/2013 0437    No new CXR  IMP/PLAN: Fever, unclear etiology - improved on current antimicrobials. Recheck CXR. F/U micro results nd narrow antibiotic coverage accordingly  AFRVR - Mgmt per TRH.   Transient hypotension - resolved     Billy Fischer, MD ; [facility] [phone].  After 5:30 PM or weekends, call [phone]

## 2013-03-28 ENCOUNTER — Inpatient Hospital Stay (HOSPITAL_COMMUNITY): Payer: Medicare Other

## 2013-03-28 DIAGNOSIS — J189 Pneumonia, unspecified organism: Secondary | ICD-10-CM

## 2013-03-28 LAB — CBC
Hemoglobin: 12 g/dL — ABNORMAL LOW (ref 13.0–17.0)
MCH: 36.4 pg — ABNORMAL HIGH (ref 26.0–34.0)
MCHC: 35 g/dL (ref 30.0–36.0)
RDW: 13.6 % (ref 11.5–15.5)
WBC: 9.6 10*3/uL (ref 4.0–10.5)

## 2013-03-28 LAB — URINE CULTURE
Colony Count: NO GROWTH
Special Requests: NORMAL

## 2013-03-28 LAB — BASIC METABOLIC PANEL
BUN: 17 mg/dL (ref 6–23)
Calcium: 8.2 mg/dL — ABNORMAL LOW (ref 8.4–10.5)
Creatinine, Ser: 1.01 mg/dL (ref 0.50–1.35)
GFR calc Af Amer: 77 mL/min — ABNORMAL LOW (ref >90)
GFR calc non Af Amer: 66 mL/min — ABNORMAL LOW (ref >90)
Glucose, Bld: 126 mg/dL — ABNORMAL HIGH (ref 70–99)
Potassium: 3.5 mEq/L (ref 3.5–5.1)

## 2013-03-28 MED ORDER — INFLUENZA VAC SPLIT QUAD 0.5 ML IM SUSP
0.5000 mL | INTRAMUSCULAR | Status: AC
  Filled 2013-03-28: qty 0.5

## 2013-03-28 MED ORDER — METOPROLOL SUCCINATE ER 50 MG PO TB24
50.0000 mg | ORAL_TABLET | Freq: Every day | ORAL | Status: DC
  Administered 2013-03-28 – 2013-03-31 (×4): 50 mg via ORAL
  Filled 2013-03-28 (×4): qty 1

## 2013-03-28 MED ORDER — LEVOFLOXACIN 750 MG PO TABS
750.0000 mg | ORAL_TABLET | Freq: Every day | ORAL | Status: DC
  Administered 2013-03-28 – 2013-03-30 (×3): 750 mg via ORAL
  Filled 2013-03-28 (×5): qty 1

## 2013-03-28 MED ORDER — FUROSEMIDE 10 MG/ML IJ SOLN
INTRAMUSCULAR | Status: AC
  Administered 2013-03-28: 20 mg via INTRAVENOUS
  Filled 2013-03-28: qty 4

## 2013-03-28 MED ORDER — FUROSEMIDE 10 MG/ML IJ SOLN
20.0000 mg | Freq: Once | INTRAMUSCULAR | Status: AC
  Administered 2013-03-28: 20 mg via INTRAVENOUS

## 2013-03-28 MED ORDER — INFLUENZA VAC SPLIT QUAD 0.5 ML IM SUSP
0.5000 mL | INTRAMUSCULAR | Status: DC
  Filled 2013-03-28: qty 0.5

## 2013-03-28 NOTE — Progress Notes (Signed)
No distress. Remains tachycardic. Continues to have thick yellow sputum  NAD HEENT WNL JVP not well visualized Minimal bibasilar crackles, no wheezes IRIR, tachy NABS Chronic stasis changes, minimal edema   BMET    Component Value Date/Time   NA 134* 03/28/2013 0420   K 3.5 03/28/2013 0420   CL 102 03/28/2013 0420   CO2 22 03/28/2013 0420   GLUCOSE 126* 03/28/2013 0420   BUN 17 03/28/2013 0420   CREATININE 1.01 03/28/2013 0420   CALCIUM 8.2* 03/28/2013 0420   GFRNONAA 66* 03/28/2013 0420   GFRAA 77* 03/28/2013 0420    CBC    Component Value Date/Time   WBC 9.6 03/28/2013 0420   RBC 3.30* 03/28/2013 0420   HGB 12.0* 03/28/2013 0420   HCT 34.3* 03/28/2013 0420   PLT 139* 03/28/2013 0420   MCV 103.9* 03/28/2013 0420   MCH 36.4* 03/28/2013 0420   MCHC 35.0 03/28/2013 0420   RDW 13.6 03/28/2013 0420   LYMPHSABS 1.0 03/27/2013 0437   MONOABS 0.9 03/27/2013 0437   EOSABS 0.0 03/27/2013 0437   BASOSABS 0.0 03/27/2013 0437    CXR:  Diffuse IS prominence. RLL AS dz  IMP/PLAN: Fever, suspect PNA  Change abx to PO levofloxacin  Complete 10 days abx total Suspect component of pulmonary edema AFRVR - Mgmt per TRH.  Transient hypotension - resolved   PCCM will sign off. Please call if we can be of further assistance  Billy Fischer, MD ; [facility] [phone].  After 5:30 PM or weekends, call [phone]

## 2013-03-28 NOTE — Progress Notes (Signed)
ANTIBIOTIC CONSULT NOTE - FOLLOW UP  Pharmacy Consult for Vancomycin + Zosyn Indication: suspected pneumonia  No Known Allergies  Patient Measurements: Height: 5\' 10"  (177.8 cm) Weight: 147 lb 7.8 oz (66.9 kg) IBW/kg (Calculated) : 73  Vital Signs: Temp: 98.1 F (36.7 C) (09/30 0800) Temp src: Oral (09/30 0800) BP: 105/72 mmHg (09/30 0800) Pulse Rate: 116 (09/30 0800) Intake/Output from previous day: 09/29 0701 - 09/30 0700 In: 4430 [P.O.:1320; I.V.:2860; IV Piggyback:250] Out: 550 [Urine:550] Intake/Output from this shift: Total I/O In: -  Out: 325 [Urine:325]  Labs:  Recent Labs  03/26/13 1934 03/27/13 0437 03/28/13 0420  WBC 10.5 9.5 9.6  HGB 12.4* 12.5* 12.0*  PLT 132* 126* 139*  CREATININE 1.12 0.99 1.01   Estimated Creatinine Clearance: 51.5 ml/min (by C-G formula based on Cr of 1.01).  Assessment: 86yom continues on day #3 broad spectrum antibiotics for suspected pneumonia. CXR shows pulmonary edema but given productive colored mucus, MD wants to continue antibiotics pending culture results.Renal function has remained stable.  9/28 Vanc>> 9/28 Zosyn>> 9/29 tamiflu x 1 dose 9/28 blood>> ngtd 9/28 urine>> neg final 9/29 resp>> GPC, GNR Strep pneumo>neg Legionella>neg Influenza> neg  Goal of Therapy:  Vancomycin trough level 15-20 mcg/ml  Plan:  1) Continue zosyn 3.375g IV q8 (4 hour infusion) 2) Obtain vancomycin trough prior to 2100 dose tonight  Fredrik Rigger 03/28/2013,10:38 AM

## 2013-03-28 NOTE — Progress Notes (Signed)
Physical Therapy Treatment Patient Details Name: Cameron [name] MRN: [id number] DOB: [birth year] Today's Date: 03/28/2013 Time: 2956-2130 PT Time Calculation (min): 25 min  PT Assessment / Plan / Recommendation  History of Present Illness Pt admit with hypotension, fever of 103 degrees, afib with RVR and sepsis.   PT Comments   Pt progressing with ambulation and mobility with cues for safety. Pt with HR varied widely throughout gait without any rate sustained with variation from 108-145, pt asymptomatic and rate decreased with static stance. Pt encouraged to continue ambulating with nursing assist  Follow Up Recommendations  Home health PT     Does the patient have the potential to tolerate intense rehabilitation     Barriers to Discharge        Equipment Recommendations       Recommendations for Other Services    Frequency     Progress towards PT Goals Progress towards PT goals: Progressing toward goals  Plan Current plan remains appropriate    Precautions / Restrictions Precautions Precautions: Fall   Pertinent Vitals/Pain No pain    Mobility  Bed Mobility Bed Mobility: Supine to Sit;Sitting - Scoot to Edge of Bed Supine to Sit: 5: Supervision;With rails;HOB flat Sitting - Scoot to Edge of Bed: 3: Mod assist Details for Bed Mobility Assistance: cueing for sequence with assist of pad to scoot to EOB Transfers Transfers: Sit to Stand;Stand to Sit Sit to Stand: 4: Min guard;From bed Stand to Sit: 4: Min guard;To chair/3-in-1;With armrests Details for Transfer Assistance: Needed cues for hand placement Ambulation/Gait Ambulation/Gait Assistance: 4: Min guard Ambulation Distance (Feet): 300 Feet Assistive device: Rolling walker Ambulation/Gait Assistance Details: cueing for position in RW and directional cues. Pt with several standing rest breaks due to pt HR spikes with decreased rate with static stance Gait Pattern: Step-through pattern;Decreased stride length Gait  velocity: decreased Stairs: No    Exercises General Exercises - Lower Extremity Long Arc Quad: AROM;Both;10 reps;Seated Hip Flexion/Marching: AROM;Both;10 reps;Seated   PT Diagnosis:    PT Problem List:   PT Treatment Interventions:     PT Goals (current goals can now be found in the care plan section)    Visit Information  Last PT Received On: 03/28/13 Assistance Needed: +1 History of Present Illness: Pt admit with hypotension, fever of 103 degrees, afib with RVR and sepsis.    Subjective Data      Cognition  Cognition Arousal/Alertness: Awake/alert Behavior During Therapy: WFL for tasks assessed/performed Overall Cognitive Status: Within Functional Limits for tasks assessed    Balance     End of Session PT - End of Session Equipment Utilized During Treatment: Gait belt Activity Tolerance: Patient tolerated treatment well Patient left: in chair;with call bell/phone within reach   GP     Delorse Lek 03/28/2013, 1:35 PM Delaney Meigs, PT [phone]

## 2013-03-28 NOTE — Consult Note (Signed)
Admit date: 03/26/2013 Referring Physician  Dr. Nehemiah Settle Primary Physician  Dr. Nehemiah Settle Primary Cardiologist  Eldridge Dace Reason for Consultation  AFib  HPI: 79 y/o who has had a nonischemic CM and AFib.  No palpitations in the past with the AFib.  EF improved with rate control.  Eliquis was started for stroke prevention.    He has not been well for the past few days.  He went to the ER and had a high fever.  HE had AFib with RVR. He is better after receiving ABx,.  He currently feels better.  No CP, SHOB or palpitations.     Additional history from the son reveals that he has been out of his meds for at least a week.       PMH:   Past Medical History  Diagnosis Date  . GERD (gastroesophageal reflux disease)   . CHF (congestive heart failure)   . Dysrhythmia     atrial fibrilation  . COPD (chronic obstructive pulmonary disease)   . Arthritis      PSH:   Past Surgical History  Procedure Laterality Date  . Hernia repair    . Tonsillectomy    . Hemmroidectmy      Allergies:  Review of patient's allergies indicates no known allergies. Prior to Admit Meds:   Prescriptions prior to admission  Medication Sig Dispense Refill  . apixaban (ELIQUIS) 5 MG TABS tablet Take 1 tablet (5 mg total) by mouth 2 (two) times daily.  60 tablet  5  . aspirin EC 81 MG tablet Take 81 mg by mouth daily.      . digoxin (LANOXIN) 0.125 MG tablet Take 1 tablet (0.125 mg total) by mouth daily.  30 tablet  5  . donepezil (ARICEPT) 10 MG tablet Take 1 tablet (10 mg total) by mouth at bedtime.  30 tablet  5  . furosemide (LASIX) 40 MG tablet Take 1 tablet (40 mg total) by mouth daily.  30 tablet  5  . levothyroxine (SYNTHROID, LEVOTHROID) 75 MCG tablet Take 1 tablet (75 mcg total) by mouth daily before breakfast.  30 tablet  5  . metoprolol succinate (TOPROL-XL) 200 MG 24 hr tablet Take 1 tablet (200 mg total) by mouth daily. Take with or immediately following a meal.  30 tablet  5  . Multiple Vitamins-Minerals  (MULTIVITAMIN WITH MINERALS) tablet Take 1 tablet by mouth daily.      Marland Kitchen omeprazole (PRILOSEC OTC) 20 MG tablet Take 20 mg by mouth daily.      Marland Kitchen OVER THE COUNTER MEDICATION Take 1 tablet by mouth daily. Nutritional supplement - does not remember name      . diltiazem (CARDIZEM CD) 180 MG 24 hr capsule Take 1 capsule (180 mg total) by mouth daily.  30 capsule  5   Fam HX:    Family History  Problem Relation Age of Onset  . Heart attack Mother   . Pulmonary embolism Father    Social HX:    History   Social History  . Marital Status: Married    Spouse Name: N/A    Number of Children: N/A  . Years of Education: N/A   Occupational History  . Not on file.   Social History Main Topics  . Smoking status: Former Smoker -- 2.00 packs/day for 40 years    Types: Cigarettes    Quit date: 06/29/1961  . Smokeless tobacco: Never Used  . Alcohol Use: 1.2 oz/week    2 Glasses of wine per  week     Comment: occasional  . Drug Use: No  . Sexual Activity: No   Other Topics Concern  . Not on file   Social History Narrative  . No narrative on file     ROS:  All 11 ROS were addressed and are negative except what is stated in the HPI  Physical Exam: Blood pressure 96/71, pulse 93, temperature 98.7 F (37.1 C), temperature source Oral, resp. rate 27, height 5\' 10"  (1.778 m), weight 66.9 kg (147 lb 7.8 oz), SpO2 95.00%.    General: Well developed, well nourished, in no acute distress Head: Eyes PERRLA, No xanthomas.   Normal cephalic and atramatic  Lungs:   Clear bilaterally to auscultation and percussion. Heart: Irregularly irregular, S1 S2             No carotid bruit. No JVD.  No abdominal bruits. No femoral bruits. Abdomen: Bowel sounds are positive, abdomen soft and non-tender without masses or                  Hernia's noted. Msk:   Normal strength and tone for age. Extremities: Tr edema.  DP +1 Neuro: Alert  Psych:  Flat affect, responds appropriately    Labs:   Lab Results   Component Value Date   WBC 9.5 03/27/2013   HGB 12.5* 03/27/2013   HCT 36.9* 03/27/2013   MCV 106.3* 03/27/2013   PLT 126* 03/27/2013    Recent Labs Lab 03/27/13 0437  NA 141  K 3.7  CL 108  CO2 19  BUN 20  CREATININE 0.99  CALCIUM 8.2*  GLUCOSE 98   No results found for this basename: PTT   Lab Results  Component Value Date   INR 1.41 03/26/2013   INR 1.16 10/07/2012   Lab Results  Component Value Date   TROPONINI <0.30 10/05/2012     No results found for this basename: CHOL   No results found for this basename: HDL   No results found for this basename: LDLCALC   No results found for this basename: TRIG   No results found for this basename: CHOLHDL   No results found for this basename: LDLDIRECT      Radiology:  Dg Chest Port 1 View  03/26/2013   CLINICAL DATA:  Weakness and confusion.  EXAM: PORTABLE CHEST - 1 VIEW  COMPARISON:  10/05/2012.  FINDINGS: The heart is borderline enlarged but stable. The mediastinal and hilar contours are within normal limits and unchanged. Chronic changes of pulmonary fibrosis. No acute overlying pulmonary process. No pleural effusion. The bony thorax is intact.  IMPRESSION: Chronic lung changes/pulmonary fibrosis without acute overlying pulmonary process.   Electronically Signed   By: Loralie Champagne M.D.   On: 03/26/2013 19:28    EKG:  AFib, RVR, lateral ST depression  ASSESSMENT: AFib, sepsis  PLAN:  Treating infection/sepsis will help heart rate.  Reintroduce rate control meds as BP tolerates.  BP better after fluid.  EF 55-60% in 5/14.  Add Eliquis back for stroke prevention when safe from a bleeding standpoint.    Longterm management of his meds may be difficult without a caretaker.   Corky Crafts., MD  03/28/2013  12:48 AM

## 2013-03-28 NOTE — Progress Notes (Signed)
SUBJECTIVE:  Mild SHOB; rattling  OBJECTIVE:   Vitals:   Filed Vitals:   03/28/13 0300 03/28/13 0400 03/28/13 0500 03/28/13 0600  BP: 102/64 101/60 113/71 108/82  Pulse: 67 91 66 64  Temp:  98.3 F (36.8 C)    TempSrc:  Oral    Resp: 20 22 17 20   Height:      Weight:      SpO2: 90% 93% 93% 91%   I&O's:   Intake/Output Summary (Last 24 hours) at 03/28/13 0759 Last data filed at 03/28/13 0600  Gross per 24 hour  Intake   4430 ml  Output    550 ml  Net   3880 ml   TELEMETRY: Reviewed telemetry pt in AFib:     PHYSICAL EXAM General: Well developed, well nourished, in no acute distress Head:   Normal cephalic and atramatic  Lungs:  Rhonchi bilaterally to auscultation Heart:  Irregularly irregular Abdomen:  abdomen soft and non-tender Msk:  Back normal,  Normal strength and tone for age. Extremities:  Tr edema.  DP +1 Neuro: Alert  Psych:  Flat affect, responds appropriately   LABS: Basic Metabolic Panel:  Recent Labs  16/10/96 0437 03/28/13 0420  NA 141 134*  K 3.7 3.5  CL 108 102  CO2 19 22  GLUCOSE 98 126*  BUN 20 17  CREATININE 0.99 1.01  CALCIUM 8.2* 8.2*   Liver Function Tests: No results found for this basename: AST, ALT, ALKPHOS, BILITOT, PROT, ALBUMIN,  in the last 72 hours No results found for this basename: LIPASE, AMYLASE,  in the last 72 hours CBC:  Recent Labs  03/27/13 0437 03/28/13 0420  WBC 9.5 9.6  NEUTROABS 7.6  --   HGB 12.5* 12.0*  HCT 36.9* 34.3*  MCV 106.3* 103.9*  PLT 126* 139*   Cardiac Enzymes: No results found for this basename: CKTOTAL, CKMB, CKMBINDEX, TROPONINI,  in the last 72 hours BNP: No components found with this basename: POCBNP,  D-Dimer: No results found for this basename: DDIMER,  in the last 72 hours Hemoglobin A1C: No results found for this basename: HGBA1C,  in the last 72 hours Fasting Lipid Panel: No results found for this basename: CHOL, HDL, LDLCALC, TRIG, CHOLHDL, LDLDIRECT,  in the last 72  hours Thyroid Function Tests:  Recent Labs  03/27/13 1615  TSH 3.995   Anemia Panel: No results found for this basename: VITAMINB12, FOLATE, FERRITIN, TIBC, IRON, RETICCTPCT,  in the last 72 hours Coag Panel:   Lab Results  Component Value Date   INR 1.41 03/26/2013   INR 1.16 10/07/2012    RADIOLOGY: Mr Brain Wo Contrast  03/23/2013   CLINICAL DATA:  Memory loss.  EXAM: MRI HEAD WITHOUT CONTRAST  TECHNIQUE: Multiplanar, multisequence MR imaging was performed. No intravenous contrast was administered.  COMPARISON:  MRI brain 12/25/2009.  FINDINGS: No acute infarct, hemorrhage, or mass lesion is present. Moderate generalized atrophy and white matter disease demonstrates some progression since the prior exam. The ventricles are proportionate to the degree of atrophy.  Flow is present in the major intracranial arteries. No significant extra-axial fluid collection is present.  The right anterior ethmoid air cells or frontal sinus, and maxillary sinus are opacified. There is a small fluid level on the left maxillary sinus. The diffusion-weighted images suggest evidence for infection within each the sinuses. The mastoid air cells are clear.  IMPRESSION: 1. Mild progression and atrophy and diffuse white matter disease. 2. No acute intracranial abnormality. 3. Right anterior paranasal  sinus disease. 4. Small fluid level in left maxillary sinus compatible with sinusitis.   Electronically Signed   By: Gennette Pac   On: 03/23/2013 13:43   Dg Chest Port 1 View  03/28/2013   CLINICAL DATA:  Short of breath. Rule out pneumonia  EXAM: PORTABLE CHEST - 1 VIEW  COMPARISON:  03/26/2013  FINDINGS: Marked progression of diffuse bilateral airspace disease, right greater than left. This may represent diffuse pneumonia versus edema. Question small effusions. Cardiac enlargement. Underlying COPD is present.  IMPRESSION: Marked progression of diffuse bilateral airspace disease, pulmonary edema is most likely. Pneumonia  not excluded.   Electronically Signed   By: Marlan Palau M.D.   On: 03/28/2013 07:13   Dg Chest Port 1 View  03/26/2013   CLINICAL DATA:  Weakness and confusion.  EXAM: PORTABLE CHEST - 1 VIEW  COMPARISON:  10/05/2012.  FINDINGS: The heart is borderline enlarged but stable. The mediastinal and hilar contours are within normal limits and unchanged. Chronic changes of pulmonary fibrosis. No acute overlying pulmonary process. No pleural effusion. The bony thorax is intact.  IMPRESSION: Chronic lung changes/pulmonary fibrosis without acute overlying pulmonary process.   Electronically Signed   By: Loralie Champagne M.D.   On: 03/26/2013 19:28      ASSESSMENT: AFib, sepsis  PLAN:  Antibiotic.  May have some diastolic heart failure with all of the fluids he received.  Will give some Lasix.  Resume rate control meds as BP tolerates.  Corky Crafts., MD  03/28/2013  7:59 AM

## 2013-03-28 NOTE — Progress Notes (Signed)
Subjective: Patient without any new complaints. He still has copious cough of colored mucus. Heart rate is better controlled. Blood pressure close to his baseline. Followup x-ray shows pulmonary edema.  Objective: Vital signs in last 24 hours: Temp:  [97.8 F (36.6 C)-98.7 F (37.1 C)] 98.3 F (36.8 C) (09/30 0400) Pulse Rate:  [30-122] 64 (09/30 0600) Resp:  [15-28] 20 (09/30 0600) BP: (96-117)/(56-82) 108/82 mmHg (09/30 0600) SpO2:  [87 %-97 %] 91 % (09/30 0600) Weight change:  Last BM Date: 03/25/13  Intake/Output from previous day: 09/29 0701 - 09/30 0700 In: 4430 [P.O.:1320; I.V.:2860; IV Piggyback:250] Out: 550 [Urine:550] Intake/Output this shift:    General appearance: Pleasant, no apparent distress Resp: Rhonchi in the right base Cardio: irregularly irregular rhythm Extremities: extremities normal, atraumatic, no cyanosis or edema  Lab Results:  Results for orders placed during the hospital encounter of 03/26/13 (from the past 24 hour(s))  TSH     Status: None   Collection Time    03/27/13  4:15 PM      Result Value Range   TSH 3.995  0.350 - 4.500 uIU/mL  BASIC METABOLIC PANEL     Status: Abnormal   Collection Time    03/28/13  4:20 AM      Result Value Range   Sodium 134 (*) 135 - 145 mEq/L   Potassium 3.5  3.5 - 5.1 mEq/L   Chloride 102  96 - 112 mEq/L   CO2 22  19 - 32 mEq/L   Glucose, Bld 126 (*) 70 - 99 mg/dL   BUN 17  6 - 23 mg/dL   Creatinine, Ser 1.61  0.50 - 1.35 mg/dL   Calcium 8.2 (*) 8.4 - 10.5 mg/dL   GFR calc non Af Amer 66 (*) >90 mL/min   GFR calc Af Amer 77 (*) >90 mL/min  CBC     Status: Abnormal   Collection Time    03/28/13  4:20 AM      Result Value Range   WBC 9.6  4.0 - 10.5 K/uL   RBC 3.30 (*) 4.22 - 5.81 MIL/uL   Hemoglobin 12.0 (*) 13.0 - 17.0 g/dL   HCT 09.6 (*) 04.5 - 40.9 %   MCV 103.9 (*) 78.0 - 100.0 fL   MCH 36.4 (*) 26.0 - 34.0 pg   MCHC 35.0  30.0 - 36.0 g/dL   RDW 81.1  91.4 - 78.2 %   Platelets 139 (*) 150 -  400 K/uL      Studies/Results: Dg Chest Port 1 View  03/28/2013   CLINICAL DATA:  Short of breath. Rule out pneumonia  EXAM: PORTABLE CHEST - 1 VIEW  COMPARISON:  03/26/2013  FINDINGS: Marked progression of diffuse bilateral airspace disease, right greater than left. This may represent diffuse pneumonia versus edema. Question small effusions. Cardiac enlargement. Underlying COPD is present.  IMPRESSION: Marked progression of diffuse bilateral airspace disease, pulmonary edema is most likely. Pneumonia not excluded.   Electronically Signed   By: Marlan Palau M.D.   On: 03/28/2013 07:13   Dg Chest Port 1 View  03/26/2013   CLINICAL DATA:  Weakness and confusion.  EXAM: PORTABLE CHEST - 1 VIEW  COMPARISON:  10/05/2012.  FINDINGS: The heart is borderline enlarged but stable. The mediastinal and hilar contours are within normal limits and unchanged. Chronic changes of pulmonary fibrosis. No acute overlying pulmonary process. No pleural effusion. The bony thorax is intact.  IMPRESSION: Chronic lung changes/pulmonary fibrosis without acute overlying pulmonary process.  Electronically Signed   By: Loralie Champagne M.D.   On: 03/26/2013 19:28    Medications:  Prior to Admission:  Prescriptions prior to admission  Medication Sig Dispense Refill  . apixaban (ELIQUIS) 5 MG TABS tablet Take 1 tablet (5 mg total) by mouth 2 (two) times daily.  60 tablet  5  . aspirin EC 81 MG tablet Take 81 mg by mouth daily.      . digoxin (LANOXIN) 0.125 MG tablet Take 1 tablet (0.125 mg total) by mouth daily.  30 tablet  5  . donepezil (ARICEPT) 10 MG tablet Take 1 tablet (10 mg total) by mouth at bedtime.  30 tablet  5  . furosemide (LASIX) 40 MG tablet Take 1 tablet (40 mg total) by mouth daily.  30 tablet  5  . levothyroxine (SYNTHROID, LEVOTHROID) 75 MCG tablet Take 1 tablet (75 mcg total) by mouth daily before breakfast.  30 tablet  5  . metoprolol succinate (TOPROL-XL) 200 MG 24 hr tablet Take 1 tablet (200 mg  total) by mouth daily. Take with or immediately following a meal.  30 tablet  5  . Multiple Vitamins-Minerals (MULTIVITAMIN WITH MINERALS) tablet Take 1 tablet by mouth daily.      Marland Kitchen omeprazole (PRILOSEC OTC) 20 MG tablet Take 20 mg by mouth daily.      Marland Kitchen OVER THE COUNTER MEDICATION Take 1 tablet by mouth daily. Nutritional supplement - does not remember name      . diltiazem (CARDIZEM CD) 180 MG 24 hr capsule Take 1 capsule (180 mg total) by mouth daily.  30 capsule  5   Scheduled: . apixaban  5 mg Oral BID  . aspirin EC  81 mg Oral Daily  . digoxin  0.125 mg Oral Daily  . donepezil  10 mg Oral QHS  . furosemide  20 mg Intravenous Once  . influenza vac split quadrivalent PF  0.5 mL Intramuscular Tomorrow-1000  . levothyroxine  75 mcg Oral QAC breakfast  . piperacillin-tazobactam (ZOSYN)  IV  3.375 g Intravenous Q8H  . sodium chloride  3 mL Intravenous Q12H  . vancomycin  750 mg Intravenous Q12H   Continuous: . diltiazem (CARDIZEM) infusion 10 mg/hr (03/28/13 0400)    Assessment/Plan: Elderly male presented to the hospital with complaint of malaise, fever productive cough. Strep pneumonia negative, influenza negative, legionella negative. Based on exam with rhonchi in right base, production of colored mucus we will continue to treat his pneumonia with broad-spectrum antibiotics pending cultures. His followup x-ray does show some volume overload, IV fluids will be stopped. His blood pressure is better, his rate is a little better.  Atrial fibrillation with rapid ventricular response rate better controlled, we will try to convert over to oral meds as rate improves.  Nonischemic cardiomyopathy, last echocardiogram in May in the office showed EF of 55-60%, dramatically improved from previously   Hypothyroidism , TSH within normal limits  Memory loss, outpatient treated with Aricept, antidepressant also recently had   LOS: 2 days   Kambra Beachem D 03/28/2013, 8:01 AM

## 2013-03-29 LAB — CULTURE, RESPIRATORY W GRAM STAIN: Culture: NORMAL

## 2013-03-29 MED ORDER — DILTIAZEM HCL 30 MG PO TABS
30.0000 mg | ORAL_TABLET | Freq: Four times a day (QID) | ORAL | Status: DC
  Administered 2013-03-29 – 2013-03-31 (×10): 30 mg via ORAL
  Filled 2013-03-29 (×14): qty 1

## 2013-03-29 NOTE — Progress Notes (Signed)
Subjective: No new problems overnight. Heart rate better controlled. Patient still with productive cough with colored mucus  Objective: Vital signs in last 24 hours: Temp:  [98.1 F (36.7 C)-98.4 F (36.9 C)] 98.2 F (36.8 C) (10/01 0400) Pulse Rate:  [70-119] 70 (10/01 0505) Resp:  [14-26] 22 (10/01 0505) BP: (90-147)/(51-85) 91/52 mmHg (10/01 0505) SpO2:  [92 %-98 %] 93 % (10/01 0505) Weight change:  Last BM Date: 03/25/13  Intake/Output from previous day: 09/30 0701 - 10/01 0700 In: 1321 [P.O.:841; I.V.:80; IV Piggyback:400] Out: 1725 [Urine:1725] Intake/Output this shift:    General appearance: alert and cooperative Resp: Decrease rhonchi in the right base of the lung fields fairly clear Cardio: irregularly irregular rhythm Extremities: extremities normal, atraumatic, no cyanosis or edema  Lab Results:  No results found for this or any previous visit (from the past 24 hour(s)).    Studies/Results: Dg Chest Port 1 View  03/28/2013   CLINICAL DATA:  Short of breath. Rule out pneumonia  EXAM: PORTABLE CHEST - 1 VIEW  COMPARISON:  03/26/2013  FINDINGS: Marked progression of diffuse bilateral airspace disease, right greater than left. This may represent diffuse pneumonia versus edema. Question small effusions. Cardiac enlargement. Underlying COPD is present.  IMPRESSION: Marked progression of diffuse bilateral airspace disease, pulmonary edema is most likely. Pneumonia not excluded.   Electronically Signed   By: Marlan Palau M.D.   On: 03/28/2013 07:13    Medications:  Prior to Admission:  Prescriptions prior to admission  Medication Sig Dispense Refill  . apixaban (ELIQUIS) 5 MG TABS tablet Take 1 tablet (5 mg total) by mouth 2 (two) times daily.  60 tablet  5  . aspirin EC 81 MG tablet Take 81 mg by mouth daily.      . digoxin (LANOXIN) 0.125 MG tablet Take 1 tablet (0.125 mg total) by mouth daily.  30 tablet  5  . donepezil (ARICEPT) 10 MG tablet Take 1 tablet (10 mg  total) by mouth at bedtime.  30 tablet  5  . furosemide (LASIX) 40 MG tablet Take 1 tablet (40 mg total) by mouth daily.  30 tablet  5  . levothyroxine (SYNTHROID, LEVOTHROID) 75 MCG tablet Take 1 tablet (75 mcg total) by mouth daily before breakfast.  30 tablet  5  . metoprolol succinate (TOPROL-XL) 200 MG 24 hr tablet Take 1 tablet (200 mg total) by mouth daily. Take with or immediately following a meal.  30 tablet  5  . Multiple Vitamins-Minerals (MULTIVITAMIN WITH MINERALS) tablet Take 1 tablet by mouth daily.      Marland Kitchen omeprazole (PRILOSEC OTC) 20 MG tablet Take 20 mg by mouth daily.      Marland Kitchen OVER THE COUNTER MEDICATION Take 1 tablet by mouth daily. Nutritional supplement - does not remember name      . diltiazem (CARDIZEM CD) 180 MG 24 hr capsule Take 1 capsule (180 mg total) by mouth daily.  30 capsule  5   Scheduled: . apixaban  5 mg Oral BID  . aspirin EC  81 mg Oral Daily  . digoxin  0.125 mg Oral Daily  . donepezil  10 mg Oral QHS  . influenza vac split quadrivalent PF  0.5 mL Intramuscular Tomorrow-1000  . levofloxacin  750 mg Oral q1800  . levothyroxine  75 mcg Oral QAC breakfast  . metoprolol succinate  50 mg Oral Daily  . sodium chloride  3 mL Intravenous Q12H   Continuous: . diltiazem (CARDIZEM) infusion 5 mg/hr (03/29/13 0600)  ZOX:WRUEAV chloride, sodium chloride  Assessment/Plan: Elderly male presented to the hospital with complaint of malaise, fever productive cough. Strep pneumonia negative, influenza negative, legionella negative. Based on exam with rhonchi in right base, production of colored mucus we will continue to treat as pneumonia. Followup x-ray showed some vascular congestion. Lung sounds are better with IV fluids DC. Pulmonary input appreciated Atrial fibrillation with rapid ventricular response his blood pressure is soft but his rate is better controlled we will wean Cardizem to off Nonischemic cardiomyopathy, last echocardiogram in May in the office showed EF  of 55-60%, dramatically improved from previously  Hypothyroidism , TSH within normal limits  Memory loss, outpatient treated with Aricept, antidepressant also recently  Added  Transfer to telemetry floor when Cardizem drip is off *   LOS: 3 days   Angeleen Horney D 03/29/2013, 7:47 AM

## 2013-03-29 NOTE — Progress Notes (Signed)
INITIAL NUTRITION ASSESSMENT  DOCUMENTATION CODES Per approved criteria  -Severe malnutrition in the context of chronic illness   INTERVENTION:  Initiate 48 hour calorie count  Magic Cup twice daily with meals (290 kcals, 9 gm protein per 4 oz cup) RD to follow for nutrition care plan  NUTRITION DIAGNOSIS: Increased nutrient needs related to sepsis as evidenced by estimated nutrition needs  Goal: Pt to meet >/= 90% of their estimated nutrition needs   Monitor:  PO & supplemental intake, weight, labs, I/O's  Reason for Assessment: Consult ---> Calorie Count  79 y.o. male  Admitting Dx: Sepsis  ASSESSMENT: Patient with PMH of HTN, mild dementia, COPD admitted after several days of not feeling well, coughing and fever; found to have temp over 103, HR 180s on arrival.   RD consult for 48 hour calorie count.  RD spoke with patient at bedside; reports he was eating fairly well PTA; usually consumes 2 meals per day (lunch & dinner); endorses weight stability; drinks an "off brand of Ensure;" once in the afternoon; he declined Ensure Complete as there is an associated charge, however, agreeable to [employer] supplement (no charge).  Per weight readings, patient has had a 13% weight loss in < 6 months (April 2014) ---> severe for time frame.  Patient meets criteria for severe malnutrition in the context of chronic illness as evidenced by < 75% intake of estimated energy requirement for > 1 month and 13% weight loss in < 6 months.  Height: Ht Readings from Last 1 Encounters:  03/26/13 5\' 10"  (1.778 m)    Weight: Wt Readings from Last 1 Encounters:  03/26/13 147 lb 7.8 oz (66.9 kg)    Ideal Body Weight: 166 lb  % Ideal Body Weight: 88%  Wt Readings from Last 10 Encounters:  03/26/13 147 lb 7.8 oz (66.9 kg)  10/09/12 169 lb 5 oz (76.8 kg)  10/09/12 169 lb 5 oz (76.8 kg)  10/09/12 169 lb 5 oz (76.8 kg)    Usual Body Weight: 169 lb  % Usual Body Weight:  86%  BMI:  Body mass index is 21.16 kg/(m^2).  Estimated Nutritional Needs: Kcal: 1600-1800 Protein: 70-80 gm Fluid: 1.6-1.8 L  Skin: Intact  Diet Order: Cardiac  EDUCATION NEEDS: -No education needs identified at this time   Intake/Output Summary (Last 24 hours) at 03/29/13 1207 Last data filed at 03/29/13 0800  Gross per 24 hour  Intake    391 ml  Output    425 ml  Net    -34 ml    Labs:   Recent Labs Lab 03/26/13 1934 03/27/13 0437 03/28/13 0420  NA 142 141 134*  K 3.9 3.7 3.5  CL 107 108 102  CO2 24 19 22   BUN 21 20 17   CREATININE 1.12 0.99 1.01  CALCIUM 8.5 8.2* 8.2*  GLUCOSE 116* 98 126*    Scheduled Meds: . apixaban  5 mg Oral BID  . aspirin EC  81 mg Oral Daily  . digoxin  0.125 mg Oral Daily  . diltiazem  30 mg Oral Q6H  . donepezil  10 mg Oral QHS  . influenza vac split quadrivalent PF  0.5 mL Intramuscular Tomorrow-1000  . levofloxacin  750 mg Oral q1800  . levothyroxine  75 mcg Oral QAC breakfast  . metoprolol succinate  50 mg Oral Daily  . sodium chloride  3 mL Intravenous Q12H    Continuous Infusions:   Past Medical History  Diagnosis Date  . GERD (gastroesophageal  reflux disease)   . CHF (congestive heart failure)   . Dysrhythmia     atrial fibrilation  . COPD (chronic obstructive pulmonary disease)   . Arthritis     Past Surgical History  Procedure Laterality Date  . Hernia repair    . Tonsillectomy    . Hemmroidectmy      Maureen Chatters, RD, LDN Pager #: [phone] After-Hours Pager #: [phone]

## 2013-03-30 DIAGNOSIS — E43 Unspecified severe protein-calorie malnutrition: Secondary | ICD-10-CM | POA: Insufficient documentation

## 2013-03-30 NOTE — Progress Notes (Signed)
Subjective: No new complaints, BP stable, pulse controlled  Objective: Vital signs in last 24 hours: Temp:  [97.8 F (36.6 C)-98.9 F (37.2 C)] 98.2 F (36.8 C) (10/02 1234) Pulse Rate:  [71-114] 114 (10/02 1116) Resp:  [22-29] 27 (10/02 1116) BP: (101-117)/(55-78) 104/69 mmHg (10/02 1116) SpO2:  [95 %-98 %] 98 % (10/02 1116) Weight change:  Last BM Date: 03/29/13  Intake/Output from previous day: 10/01 0701 - 10/02 0700 In: 1080 [P.O.:1080] Out: 1475 [Urine:1475] Intake/Output this shift: Total I/O In: 3 [I.V.:3] Out: 200 [Urine:200]  General appearance: cooperative Resp: a few crackles at the right base otherwise clear to auscultation bilateral Cardio: irregularly irregular rhythm Extremities: extremities normal, atraumatic, no cyanosis or edema  Lab Results:  No results found for this or any previous visit (from the past 24 hour(s)).    Studies/Results: No results found.  Medications:  Prior to Admission:  Prescriptions prior to admission  Medication Sig Dispense Refill  . apixaban (ELIQUIS) 5 MG TABS tablet Take 1 tablet (5 mg total) by mouth 2 (two) times daily.  60 tablet  5  . aspirin EC 81 MG tablet Take 81 mg by mouth daily.      . digoxin (LANOXIN) 0.125 MG tablet Take 1 tablet (0.125 mg total) by mouth daily.  30 tablet  5  . donepezil (ARICEPT) 10 MG tablet Take 1 tablet (10 mg total) by mouth at bedtime.  30 tablet  5  . furosemide (LASIX) 40 MG tablet Take 1 tablet (40 mg total) by mouth daily.  30 tablet  5  . levothyroxine (SYNTHROID, LEVOTHROID) 75 MCG tablet Take 1 tablet (75 mcg total) by mouth daily before breakfast.  30 tablet  5  . metoprolol succinate (TOPROL-XL) 200 MG 24 hr tablet Take 1 tablet (200 mg total) by mouth daily. Take with or immediately following a meal.  30 tablet  5  . Multiple Vitamins-Minerals (MULTIVITAMIN WITH MINERALS) tablet Take 1 tablet by mouth daily.      Marland Kitchen omeprazole (PRILOSEC OTC) 20 MG tablet Take 20 mg by mouth  daily.      Marland Kitchen OVER THE COUNTER MEDICATION Take 1 tablet by mouth daily. Nutritional supplement - does not remember name      . diltiazem (CARDIZEM CD) 180 MG 24 hr capsule Take 1 capsule (180 mg total) by mouth daily.  30 capsule  5   Scheduled: . apixaban  5 mg Oral BID  . aspirin EC  81 mg Oral Daily  . digoxin  0.125 mg Oral Daily  . diltiazem  30 mg Oral Q6H  . donepezil  10 mg Oral QHS  . levofloxacin  750 mg Oral q1800  . levothyroxine  75 mcg Oral QAC breakfast  . metoprolol succinate  50 mg Oral Daily  . sodium chloride  3 mL Intravenous Q12H   Continuous:  WGN:FAOZHY chloride, sodium chloride  Assessment/Plan: Elderly male presented to the hospital with complaint of malaise, fever productive cough. Strep pneumonia negative, influenza negative, legionella negative. Based on exam with rhonchi in right base, production of colored mucus we will continue to treat as pneumonia a total of 10 days antibiotics  Atrial fibrillation with rapid ventricular response his blood pressure is soft but his rate is better controlled Cardizem drip is off, stable for transfer to a floor bed  Nonischemic cardiomyopathy, last echocardiogram in May in the office showed EF of 55-60%, dramatically improved from previously   Hypothyroidism , TSH within normal limits   Memory loss,  outpatient treated with Aricept, antidepressant also recently added   Patient has been seen by case management per family request. Hopefully discharge home tomorrow  LOS: 4 days   Symiah Nowotny D 03/30/2013, 1:07 PM

## 2013-03-30 NOTE — Progress Notes (Signed)
NUTRITION FOLLOW UP  Intervention:    Discontinue calorie count  Continue Magic Cup twice daily with meals (290 kcals, 9 gm protein per 4 oz cup) RD to follow for nutrition care plan  Nutrition Dx:   Increased nutrient needs related to sepsis as evidenced by estimated nutrition needs, ongoing  Goal:   Pt to meet >/= 90% of their estimated nutrition needs, met  Monitor:   PO & supplemental intake, weight, labs, I/O's  Assessment:   Patient with PMH of HTN, mild dementia, COPD admitted after several days of not feeling well, coughing and fever; found to have temp over 103, HR 180s on arrival.   48 calorie count ordered 10/1.  No meal tickets available.  PO intake 80-100% per flowsheet records.  RD feels patient meeting > 90% of estimated nutrition needs at this time.  Height: Ht Readings from Last 1 Encounters:  03/26/13 5\' 10"  (1.778 m)    Weight Status:   Wt Readings from Last 1 Encounters:  03/26/13 147 lb 7.8 oz (66.9 kg)    Re-estimated needs:  Kcal: 1600-1800 Protein: 70-80 gm Fluid: 1.6-1.8 L  Skin: Intact  Diet Order: Cardiac   Intake/Output Summary (Last 24 hours) at 03/30/13 1314 Last data filed at 03/30/13 1117  Gross per 24 hour  Intake    363 ml  Output   1450 ml  Net  -1087 ml    Labs:   Recent Labs Lab 03/26/13 1934 03/27/13 0437 03/28/13 0420  NA 142 141 134*  K 3.9 3.7 3.5  CL 107 108 102  CO2 24 19 22   BUN 21 20 17   CREATININE 1.12 0.99 1.01  CALCIUM 8.5 8.2* 8.2*  GLUCOSE 116* 98 126*    Scheduled Meds: . apixaban  5 mg Oral BID  . aspirin EC  81 mg Oral Daily  . digoxin  0.125 mg Oral Daily  . diltiazem  30 mg Oral Q6H  . donepezil  10 mg Oral QHS  . levofloxacin  750 mg Oral q1800  . levothyroxine  75 mcg Oral QAC breakfast  . metoprolol succinate  50 mg Oral Daily  . sodium chloride  3 mL Intravenous Q12H    Continuous Infusions:   Maureen Chatters, RD, LDN Pager #: [phone] After-Hours Pager #: [phone]

## 2013-03-30 NOTE — Progress Notes (Signed)
Utilization review completed.  

## 2013-03-30 NOTE — Progress Notes (Signed)
03/30/13 Pt.is A/Ox4 and had no c/o pain or distress during the shift. Pt. HR increased to 140's non-sustained. Pt.asymptomatic. VS were taken. Pt.was a transfer from another unit and Cardizem po wasn't given because it wasn't available but was requested from pharmacy to send up. Once medication was available patient was given Cardizem. Pt.is resting in bed and HR is currently 105 bpm.

## 2013-03-30 NOTE — Progress Notes (Signed)
The patient has mild shortness of breath. Otherwise, no complaint.  Vital signs are stable, but with relatively soft. Blood pressure.  Monitor demonstrates atrial fibrillation with moderate rate control, but still gets up into the low 100s.  1. CHRONIC atrial fibrillation. Rate control is still not optimal. He is on 200 mg of metoprolol as an outpatient. His pressure is too soft for that dose right now.  2. Status post sepsis.  Plan: Gradually uptitrate oral. Rate controlling agents as tolerated by blood pressure. Agree with current measures.

## 2013-03-30 NOTE — Progress Notes (Signed)
Report called to [facility], receiving RN on  [address]. VSS. Transferred to 4E01 via wheelchair with personal belongings. Called patient's family member with new room number.  [facility]

## 2013-03-30 NOTE — Progress Notes (Signed)
Patient ID: Cameron [name], male   DOB: August 31, 1925, 79 y.o.   MRN: [id number] Patient medically stable for transfer to a telemetry floor bed. See previous note for details

## 2013-03-31 MED ORDER — METOPROLOL SUCCINATE ER 50 MG PO TB24: 50.0000 mg | ORAL_TABLET | Freq: Every day | ORAL | Status: DC

## 2013-03-31 MED ORDER — LEVOFLOXACIN 750 MG PO TABS: 750.0000 mg | ORAL_TABLET | Freq: Every day | ORAL | Status: AC

## 2013-03-31 MED ORDER — INFLUENZA VAC SPLIT QUAD 0.5 ML IM SUSP
0.5000 mL | Freq: Once | INTRAMUSCULAR | Status: AC
  Administered 2013-03-31: 12:00:00 0.5 mL via INTRAMUSCULAR
  Filled 2013-03-31: qty 0.5

## 2013-03-31 MED ORDER — FUROSEMIDE 20 MG PO TABS
20.0000 mg | ORAL_TABLET | Freq: Every day | ORAL | Status: DC
  Administered 2013-03-31: 11:00:00 20 mg via ORAL
  Filled 2013-03-31 (×2): qty 1

## 2013-03-31 MED ORDER — DILTIAZEM HCL ER COATED BEADS 120 MG PO CP24: 120.0000 mg | ORAL_CAPSULE | Freq: Every day | ORAL | Status: DC

## 2013-03-31 MED ORDER — FUROSEMIDE 40 MG PO TABS: 20.0000 mg | ORAL_TABLET | Freq: Every day | ORAL | Status: DC

## 2013-03-31 NOTE — Discharge Summary (Addendum)
Physician Discharge Summary  Patient ID: Cameron [name] MRN: [id number] DOB/AGE: 01-Feb-1926 79 y.o.  Admit date: 03/26/2013 Discharge date: 03/31/2013  Admission Diagnoses:  Discharge Diagnoses:  Principal Problem:   Sepsis Active Problems:   PULMONARY FIBROSIS, POSTINFLAMMATORY   Atrial fibrillation with RVR   FUO (fever of unknown origin)   NICM (nonischemic cardiomyopathy) EF 30%   Chronic systolic CHF (congestive heart failure)   Fever   Hypotension   PNA (pneumonia)   Protein-calorie malnutrition, severe   Discharged Condition: stable  Hospital Course:  The patient presented to the hospital with complaint of fever malaise productive cough. In the ER he was evaluated patient's heart rate was uncontrolled, he was relatively hypotensive admission was deemed necessary for further evaluation and treatment. Please see dictated H&P for further details  Hospital course by problem  Elderly male presented to the hospital with complaint of malaise, fever productive cough. Strep pneumonia negative, influenza negative, legionella negative. Based on exam with rhonchi in right base, production of colored mucus we will continue to treat as pneumonia a total of 10 days antibiotics . Patient's course collocated by relative hypotension initially. He required multiple boluses of fluids which cause mild by overload. Upon stopping fluids volume overload resolved. Oral Lasix was added at day of discharge as the patient did show some leg edema.  Atrial fibrillation with rapid ventricular response Patient did not require IV Cardizem. We will ultimately able to convert this to oral Cardizem. At discharge his rate is well controlled. Patient soft blood pressure has required [place] to reduce his medications that he typically takes for atrial fibrillation. Patient has been seen in consultation by his cardiologist.  Nonischemic cardiomyopathy, last echocardiogram in May in the office showed EF of 55-60%,  dramatically improved from previously   Hypothyroidism , TSH within normal limits   Memory loss, outpatient treated with Aricept, antidepressant also recently added, I am unsure if patient ever took this medication. This will be reevaluated on an outpatient basis.  45 minutes were spent in the discharge process of this patient discussing with his family, his cardiologist's the above recommendations.   Consults: Treatment Team:  Everette Rank, MD  Significant Diagnostic Studies:Mr Brain Wo Contrast  03/23/2013   CLINICAL DATA:  Memory loss.  EXAM: MRI HEAD WITHOUT CONTRAST  TECHNIQUE: Multiplanar, multisequence MR imaging was performed. No intravenous contrast was administered.  COMPARISON:  MRI brain 12/25/2009.  FINDINGS: No acute infarct, hemorrhage, or mass lesion is present. Moderate generalized atrophy and white matter disease demonstrates some progression since the prior exam. The ventricles are proportionate to the degree of atrophy.  Flow is present in the major intracranial arteries. No significant extra-axial fluid collection is present.  The right anterior ethmoid air cells or frontal sinus, and maxillary sinus are opacified. There is a small fluid level on the left maxillary sinus. The diffusion-weighted images suggest evidence for infection within each the sinuses. The mastoid air cells are clear.  IMPRESSION: 1. Mild progression and atrophy and diffuse white matter disease. 2. No acute intracranial abnormality. 3. Right anterior paranasal sinus disease. 4. Small fluid level in left maxillary sinus compatible with sinusitis.   Electronically Signed   By: Gennette Pac   On: 03/23/2013 13:43   Dg Chest Port 1 View  03/28/2013   CLINICAL DATA:  Short of breath. Rule out pneumonia  EXAM: PORTABLE CHEST - 1 VIEW  COMPARISON:  03/26/2013  FINDINGS: Marked progression of diffuse bilateral airspace disease, right greater than left. This  may represent diffuse pneumonia versus edema. Question  small effusions. Cardiac enlargement. Underlying COPD is present.  IMPRESSION: Marked progression of diffuse bilateral airspace disease, pulmonary edema is most likely. Pneumonia not excluded.   Electronically Signed   By: Marlan Palau M.D.   On: 03/28/2013 07:13   Dg Chest Port 1 View  03/26/2013   CLINICAL DATA:  Weakness and confusion.  EXAM: PORTABLE CHEST - 1 VIEW  COMPARISON:  10/05/2012.  FINDINGS: The heart is borderline enlarged but stable. The mediastinal and hilar contours are within normal limits and unchanged. Chronic changes of pulmonary fibrosis. No acute overlying pulmonary process. No pleural effusion. The bony thorax is intact.  IMPRESSION: Chronic lung changes/pulmonary fibrosis without acute overlying pulmonary process.   Electronically Signed   By: Loralie Champagne M.D.   On: 03/26/2013 19:28      Discharge Exam: Blood pressure 101/48, pulse 83, temperature 98.4 F (36.9 C), temperature source Oral, resp. rate 18, height 5\' 6"  (1.676 m), weight 70.6 kg (155 lb 10.3 oz), SpO2 99.00%. General appearance: alert and cooperative Resp: few crackles right base otherwise lungs clear to auscultation without rales without expiratory wheeze Cardio: irregularly irregular rhythm Extremities: edema 1-2+  Disposition: 01-Home or Self Care   Future Appointments Provider Department Dept Phone   05/19/2013 1:30 PM Levert Feinstein, MD [facility] [phone]   09/04/2013 10:00 AM Lesleigh Noe, MD [facility] [phone]    followup with PCP in one week   Medication List         apixaban 5 MG Tabs tablet  Commonly known as:  ELIQUIS  Take 1 tablet (5 mg total) by mouth 2 (two) times daily.     aspirin EC 81 MG tablet  Take 81 mg by mouth daily.     digoxin 0.125 MG tablet  Commonly known as:  LANOXIN  Take 1 tablet (0.125 mg total) by mouth daily.     diltiazem 120 MG 24 hr capsule  Commonly known as:  CARDIZEM CD  Take 1 capsule (120 mg  total) by mouth daily.     donepezil 10 MG tablet  Commonly known as:  ARICEPT  Take 1 tablet (10 mg total) by mouth at bedtime.     furosemide 40 MG tablet  Commonly known as:  LASIX  Take 0.5 tablets (20 mg total) by mouth daily.     levofloxacin 750 MG tablet  Commonly known as:  LEVAQUIN  Take 1 tablet (750 mg total) by mouth daily at 6 PM.     levothyroxine 75 MCG tablet  Commonly known as:  SYNTHROID, LEVOTHROID  Take 1 tablet (75 mcg total) by mouth daily before breakfast.     metoprolol succinate 50 MG 24 hr tablet  Commonly known as:  TOPROL-XL  Take 1 tablet (50 mg total) by mouth daily. Take with or immediately following a meal.     multivitamin with minerals tablet  Take 1 tablet by mouth daily.     omeprazole 20 MG tablet  Commonly known as:  PRILOSEC OTC  Take 20 mg by mouth daily.     OVER THE COUNTER MEDICATION  Take 1 tablet by mouth daily. Nutritional supplement - does not remember name           45 minutes were spent in the discharge process of this patient, discussing with family, cardiologist changes made above also in arrangement of home health services  Signed: Katy Apo 03/31/2013, 1:14 PM

## 2013-03-31 NOTE — Progress Notes (Signed)
SUBJECTIVE:  Breathing better.  Wants to go home.  OBJECTIVE:   Vitals:   Filed Vitals:   03/30/13 2048 03/31/13 0056 03/31/13 0431 03/31/13 0635  BP: 116/84 109/56 106/65   Pulse: 118 89 87   Temp: 98.1 F (36.7 C)  98.4 F (36.9 C)   TempSrc: Oral  Oral   Resp: 20 19 18    Height:      Weight:   160 lb (72.576 kg) 155 lb 10.3 oz (70.6 kg)  SpO2: 96%  99%    I&O's:    Intake/Output Summary (Last 24 hours) at 03/31/13 0853 Last data filed at 03/31/13 0841  Gross per 24 hour  Intake    549 ml  Output   2600 ml  Net  -2051 ml   TELEMETRY: Reviewed telemetry pt in AFib:     PHYSICAL EXAM General: Well developed, well nourished, in no acute distress Head:   Normal cephalic and atramatic  Lungs:  No wheezing Heart:  Irregularly irregular Abdomen:  abdomen soft and non-tender Msk:  Back normal,  Normal strength and tone for age. Extremities:  Tr edema.  DP +1 Neuro: Alert  Psych:  Flat affect, responds appropriately   LABS: Basic Metabolic Panel: No results found for this basename: NA, K, CL, CO2, GLUCOSE, BUN, CREATININE, CALCIUM, MG, PHOS,  in the last 72 hours Liver Function Tests: No results found for this basename: AST, ALT, ALKPHOS, BILITOT, PROT, ALBUMIN,  in the last 72 hours No results found for this basename: LIPASE, AMYLASE,  in the last 72 hours CBC: No results found for this basename: WBC, NEUTROABS, HGB, HCT, MCV, PLT,  in the last 72 hours Cardiac Enzymes: No results found for this basename: CKTOTAL, CKMB, CKMBINDEX, TROPONINI,  in the last 72 hours BNP: No components found with this basename: POCBNP,  D-Dimer: No results found for this basename: DDIMER,  in the last 72 hours Hemoglobin A1C: No results found for this basename: HGBA1C,  in the last 72 hours Fasting Lipid Panel: No results found for this basename: CHOL, HDL, LDLCALC, TRIG, CHOLHDL, LDLDIRECT,  in the last 72 hours Thyroid Function Tests: No results found for this basename: TSH,  T4TOTAL, FREET3, T3FREE, THYROIDAB,  in the last 72 hours Anemia Panel: No results found for this basename: VITAMINB12, FOLATE, FERRITIN, TIBC, IRON, RETICCTPCT,  in the last 72 hours Coag Panel:   Lab Results  Component Value Date   INR 1.41 03/26/2013   INR 1.16 10/07/2012    RADIOLOGY: Mr Brain Wo Contrast  03/23/2013   CLINICAL DATA:  Memory loss.  EXAM: MRI HEAD WITHOUT CONTRAST  TECHNIQUE: Multiplanar, multisequence MR imaging was performed. No intravenous contrast was administered.  COMPARISON:  MRI brain 12/25/2009.  FINDINGS: No acute infarct, hemorrhage, or mass lesion is present. Moderate generalized atrophy and white matter disease demonstrates some progression since the prior exam. The ventricles are proportionate to the degree of atrophy.  Flow is present in the major intracranial arteries. No significant extra-axial fluid collection is present.  The right anterior ethmoid air cells or frontal sinus, and maxillary sinus are opacified. There is a small fluid level on the left maxillary sinus. The diffusion-weighted images suggest evidence for infection within each the sinuses. The mastoid air cells are clear.  IMPRESSION: 1. Mild progression and atrophy and diffuse white matter disease. 2. No acute intracranial abnormality. 3. Right anterior paranasal sinus disease. 4. Small fluid level in left maxillary sinus compatible with sinusitis.   Electronically Signed  By: Gennette Pac   On: 03/23/2013 13:43   Dg Chest Port 1 View  03/28/2013   CLINICAL DATA:  Short of breath. Rule out pneumonia  EXAM: PORTABLE CHEST - 1 VIEW  COMPARISON:  03/26/2013  FINDINGS: Marked progression of diffuse bilateral airspace disease, right greater than left. This may represent diffuse pneumonia versus edema. Question small effusions. Cardiac enlargement. Underlying COPD is present.  IMPRESSION: Marked progression of diffuse bilateral airspace disease, pulmonary edema is most likely. Pneumonia not excluded.    Electronically Signed   By: Marlan Palau M.D.   On: 03/28/2013 07:13   Dg Chest Port 1 View  03/26/2013   CLINICAL DATA:  Weakness and confusion.  EXAM: PORTABLE CHEST - 1 VIEW  COMPARISON:  10/05/2012.  FINDINGS: The heart is borderline enlarged but stable. The mediastinal and hilar contours are within normal limits and unchanged. Chronic changes of pulmonary fibrosis. No acute overlying pulmonary process. No pleural effusion. The bony thorax is intact.  IMPRESSION: Chronic lung changes/pulmonary fibrosis without acute overlying pulmonary process.   Electronically Signed   By: Loralie Champagne M.D.   On: 03/26/2013 19:28      ASSESSMENT: AFib, sepsis  PLAN:   diastolic heart failure with age and AFib.  Will give some Lasix.  Increase rate control meds as BP tolerates.  BP borderline at times now.  WIll start low dose LAsix. Medication compliance is a big issue.  Eliquis for stroke prevention.  Corky Crafts., MD  03/31/2013  8:53 AM

## 2013-03-31 NOTE — Progress Notes (Signed)
Patient being discharged to home with wife, home health set up, pt given discharge instructions, verbalized understanding, patient left via wheelchair, pt stable.

## 2013-04-02 LAB — CULTURE, BLOOD (ROUTINE X 2): Culture: NO GROWTH

## 2013-05-19 ENCOUNTER — Ambulatory Visit: Payer: Medicare Other | Admitting: Neurology

## 2013-06-30 ENCOUNTER — Other Ambulatory Visit (HOSPITAL_COMMUNITY): Payer: Self-pay | Admitting: Internal Medicine

## 2013-07-12 ENCOUNTER — Observation Stay (HOSPITAL_COMMUNITY)
Admission: EM | Admit: 2013-07-12 | Discharge: 2013-07-17 | Disposition: A | Discharge status: Skilled Nursing Facility | Payer: Medicare Other | Attending: Internal Medicine | Admitting: Internal Medicine

## 2013-07-12 ENCOUNTER — Encounter (HOSPITAL_COMMUNITY): Payer: Self-pay | Admitting: Emergency Medicine

## 2013-07-12 ENCOUNTER — Emergency Department (HOSPITAL_COMMUNITY): Payer: Medicare Other

## 2013-07-12 DIAGNOSIS — I428 Other cardiomyopathies: Secondary | ICD-10-CM

## 2013-07-12 DIAGNOSIS — I517 Cardiomegaly: Secondary | ICD-10-CM | POA: Insufficient documentation

## 2013-07-12 DIAGNOSIS — I5032 Chronic diastolic (congestive) heart failure: Secondary | ICD-10-CM | POA: Diagnosis present

## 2013-07-12 DIAGNOSIS — R197 Diarrhea, unspecified: Secondary | ICD-10-CM | POA: Diagnosis present

## 2013-07-12 DIAGNOSIS — R5383 Other fatigue: Principal | ICD-10-CM

## 2013-07-12 DIAGNOSIS — F0390 Unspecified dementia without behavioral disturbance: Secondary | ICD-10-CM | POA: Diagnosis present

## 2013-07-12 DIAGNOSIS — J449 Chronic obstructive pulmonary disease, unspecified: Secondary | ICD-10-CM | POA: Insufficient documentation

## 2013-07-12 DIAGNOSIS — R531 Weakness: Secondary | ICD-10-CM | POA: Diagnosis present

## 2013-07-12 DIAGNOSIS — J4489 Other specified chronic obstructive pulmonary disease: Secondary | ICD-10-CM | POA: Insufficient documentation

## 2013-07-12 DIAGNOSIS — R55 Syncope and collapse: Secondary | ICD-10-CM

## 2013-07-12 DIAGNOSIS — J189 Pneumonia, unspecified organism: Secondary | ICD-10-CM

## 2013-07-12 DIAGNOSIS — I5022 Chronic systolic (congestive) heart failure: Secondary | ICD-10-CM

## 2013-07-12 DIAGNOSIS — I509 Heart failure, unspecified: Secondary | ICD-10-CM | POA: Insufficient documentation

## 2013-07-12 DIAGNOSIS — R5381 Other malaise: Principal | ICD-10-CM | POA: Insufficient documentation

## 2013-07-12 DIAGNOSIS — J8410 Pulmonary fibrosis, unspecified: Secondary | ICD-10-CM | POA: Diagnosis present

## 2013-07-12 DIAGNOSIS — I4891 Unspecified atrial fibrillation: Secondary | ICD-10-CM | POA: Insufficient documentation

## 2013-07-12 DIAGNOSIS — K219 Gastro-esophageal reflux disease without esophagitis: Secondary | ICD-10-CM | POA: Insufficient documentation

## 2013-07-12 DIAGNOSIS — R509 Fever, unspecified: Secondary | ICD-10-CM

## 2013-07-12 LAB — URINALYSIS, ROUTINE W REFLEX MICROSCOPIC
Bilirubin Urine: NEGATIVE
GLUCOSE, UA: NEGATIVE mg/dL
KETONES UR: NEGATIVE mg/dL
Leukocytes, UA: NEGATIVE
Nitrite: NEGATIVE
Protein, ur: NEGATIVE mg/dL
Specific Gravity, Urine: 1.017 (ref 1.005–1.030)
Urobilinogen, UA: 0.2 mg/dL (ref 0.0–1.0)
pH: 5 (ref 5.0–8.0)

## 2013-07-12 LAB — COMPREHENSIVE METABOLIC PANEL
ALBUMIN: 3.4 g/dL — AB (ref 3.5–5.2)
ALT: 14 U/L (ref 0–53)
AST: 23 U/L (ref 0–37)
Alkaline Phosphatase: 54 U/L (ref 39–117)
BILIRUBIN TOTAL: 0.4 mg/dL (ref 0.3–1.2)
BUN: 19 mg/dL (ref 6–23)
CHLORIDE: 103 meq/L (ref 96–112)
CO2: 25 mEq/L (ref 19–32)
Calcium: 9.2 mg/dL (ref 8.4–10.5)
Creatinine, Ser: 0.98 mg/dL (ref 0.50–1.35)
GFR calc Af Amer: 84 mL/min — ABNORMAL LOW (ref >90)
GFR calc non Af Amer: 73 mL/min — ABNORMAL LOW (ref >90)
Glucose, Bld: 141 mg/dL — ABNORMAL HIGH (ref 70–99)
Potassium: 3.6 mEq/L — ABNORMAL LOW (ref 3.7–5.3)
Sodium: 140 mEq/L (ref 137–147)
Total Protein: 7.4 g/dL (ref 6.0–8.3)

## 2013-07-12 LAB — CBC
HEMATOCRIT: 36.3 % — AB (ref 39.0–52.0)
Hemoglobin: 12.5 g/dL — ABNORMAL LOW (ref 13.0–17.0)
MCH: 35.8 pg — ABNORMAL HIGH (ref 26.0–34.0)
MCHC: 34.4 g/dL (ref 30.0–36.0)
MCV: 104 fL — ABNORMAL HIGH (ref 78.0–100.0)
PLATELETS: 145 10*3/uL — AB (ref 150–400)
RBC: 3.49 MIL/uL — ABNORMAL LOW (ref 4.22–5.81)
RDW: 13.7 % (ref 11.5–15.5)
WBC: 12.3 10*3/uL — AB (ref 4.0–10.5)

## 2013-07-12 LAB — URINE MICROSCOPIC-ADD ON

## 2013-07-12 LAB — CG4 I-STAT (LACTIC ACID): LACTIC ACID, VENOUS: 1.61 mmol/L (ref 0.5–2.2)

## 2013-07-12 LAB — TROPONIN I: Troponin I: <0.3 ng/mL (ref <0.30)

## 2013-07-12 MED ORDER — SODIUM CHLORIDE 0.9 % IV BOLUS (SEPSIS)
500.0000 mL | Freq: Once | INTRAVENOUS | Status: AC
Start: 2013-07-12 — End: 2013-07-12
  Administered 2013-07-12: 500 mL via INTRAVENOUS

## 2013-07-12 MED ORDER — ACETAMINOPHEN 325 MG PO TABS
650.0000 mg | ORAL_TABLET | Freq: Once | ORAL | Status: AC
  Administered 2013-07-12: 650 mg via ORAL
  Filled 2013-07-12: qty 2

## 2013-07-12 MED ORDER — SODIUM CHLORIDE 0.9 % IV SOLN
INTRAVENOUS | Status: DC
  Administered 2013-07-12: 21:00:00 via INTRAVENOUS

## 2013-07-12 MED ORDER — OSELTAMIVIR PHOSPHATE 75 MG PO CAPS
75.0000 mg | ORAL_CAPSULE | Freq: Once | ORAL | Status: AC
  Administered 2013-07-13: 75 mg via ORAL
  Filled 2013-07-12: qty 1

## 2013-07-12 NOTE — ED Provider Notes (Signed)
CSN: [id number]     Arrival date & time 07/12/13  1941 History   First MD Initiated Contact with Patient 07/12/13 2031     Chief Complaint  Patient presents with  . Weakness   (Consider location/radiation/quality/duration/timing/severity/associated sxs/prior Treatment) Patient is a 80 y.o. male presenting with weakness. The history is provided by the patient and a relative.  Weakness  Cameron [name] is a 80 y.o. male who presents for evaluation, from home after a near syncopal episode. He was reportedly sitting on the commode, having  diarrhea, when he almost fell off it. He was seen earlier today, his PCP, who recommended that he come to the hospital. He was seen earlier today for evaluation of weakness, sleepiness, and cough. Duration of the symptoms are unknown. The patient cannot give history.   Level V caveat: Dementia   Past Medical History  Diagnosis Date  . GERD (gastroesophageal reflux disease)   . CHF (congestive heart failure)   . Dysrhythmia     atrial fibrilation  . COPD (chronic obstructive pulmonary disease)   . Arthritis    Past Surgical History  Procedure Laterality Date  . Hernia repair    . Tonsillectomy    . Hemmroidectmy     Family History  Problem Relation Age of Onset  . Heart attack Mother   . Pulmonary embolism Father    History  Substance Use Topics  . Smoking status: Former Smoker -- 2.00 packs/day for 40 years    Types: Cigarettes    Quit date: 06/29/1961  . Smokeless tobacco: Never Used  . Alcohol Use: 1.2 oz/week    2 Glasses of wine per week     Comment: occasional    Review of Systems  Unable to perform ROS Neurological: Positive for weakness.    Allergies  Review of patient's allergies indicates no known allergies.  Home Medications   Current Outpatient Rx  Name  Route  Sig  Dispense  Refill  . albuterol (PROVENTIL HFA;VENTOLIN HFA) 108 (90 BASE) MCG/ACT inhaler   Inhalation   Inhale 1-2 puffs into the lungs every 6 (six)  hours as needed for wheezing or shortness of breath.         Marland Kitchen apixaban (ELIQUIS) 5 MG TABS tablet   Oral   Take 1 tablet (5 mg total) by mouth 2 (two) times daily.   60 tablet   5   . digoxin (LANOXIN) 0.125 MG tablet   Oral   Take 0.125 mg by mouth daily.         Marland Kitchen diltiazem (CARDIZEM CD) 120 MG 24 hr capsule   Oral   Take 120 mg by mouth daily.         Marland Kitchen donepezil (ARICEPT) 10 MG tablet   Oral   Take 1 tablet (10 mg total) by mouth at bedtime.   30 tablet   5   . furosemide (LASIX) 20 MG tablet   Oral   Take 20 mg by mouth daily.         . Glucosamine-Chondroit-Vit C-Mn (GLUCOSAMINE 1500 COMPLEX) CAPS   Oral   Take 1 capsule by mouth daily.         Marland Kitchen levothyroxine (SYNTHROID, LEVOTHROID) 75 MCG tablet   Oral   Take 1 tablet (75 mcg total) by mouth daily before breakfast.   30 tablet   5   . metoprolol succinate (TOPROL-XL) 25 MG 24 hr tablet   Oral   Take 25 mg by mouth daily.         Marland Kitchen  omeprazole (PRILOSEC OTC) 20 MG tablet   Oral   Take 20 mg by mouth daily.         . vitamin B-12 (CYANOCOBALAMIN) 500 MCG tablet   Oral   Take 500 mcg by mouth daily.          BP 104/46  Pulse 86  Temp(Src) 102.1 F (38.9 C) (Rectal)  Resp 26  SpO2 96% Physical Exam  Nursing note and vitals reviewed. Constitutional: He is oriented to person, place, and time. He appears well-developed.  Elderly, frail  HENT:  Head: Normocephalic and atraumatic.  Right Ear: External ear normal.  Left Ear: External ear normal.  Eyes: Conjunctivae and EOM are normal. Pupils are equal, round, and reactive to light.  Neck: Normal range of motion and phonation normal. Neck supple.  Cardiovascular: Normal rate, normal heart sounds and intact distal pulses.   Irregular rhythm. Borderline tachycardia.  Pulmonary/Chest: Effort normal. No respiratory distress. He exhibits no bony tenderness.  Scattered rhonchi  Abdominal: Soft. Normal appearance. There is no tenderness.   Musculoskeletal: Normal range of motion.  Neurological: He is alert and oriented to person, place, and time. No cranial nerve deficit or sensory deficit. He exhibits normal muscle tone. Coordination normal.  Skin: Skin is warm, dry and intact.  Psychiatric: He has a normal mood and affect. His behavior is normal.    ED Course  Procedures (including critical care time)  Medications  0.9 %  sodium chloride infusion ( Intravenous New Bag/Given 07/12/13 2058)  oseltamivir (TAMIFLU) capsule 75 mg (not administered)  sodium chloride 0.9 % bolus 500 mL (0 mLs Intravenous Stopped 07/12/13 2207)  acetaminophen (TYLENOL) tablet 650 mg (650 mg Oral Given 07/12/13 2207)    Patient Vitals for the past 24 hrs:  BP Temp Temp src Pulse Resp SpO2  07/12/13 2245 104/46 mmHg - - 86 26 96 %  07/12/13 2230 109/50 mmHg - - 90 29 96 %  07/12/13 2215 121/58 mmHg - - 73 25 98 %  07/12/13 2200 130/59 mmHg - - 85 25 96 %  07/12/13 2145 131/56 mmHg - - 95 24 96 %  07/12/13 2130 133/67 mmHg - - 88 28 97 %  07/12/13 2115 127/60 mmHg - - 89 27 97 %  07/12/13 2100 123/55 mmHg - - 91 25 97 %  07/12/13 2058 - 102.1 F (38.9 C) Rectal - - -  07/12/13 2045 124/55 mmHg - - 105 26 97 %  07/12/13 2030 121/59 mmHg - - 87 23 94 %  07/12/13 2015 123/57 mmHg - - 100 23 95 %  07/12/13 2000 130/64 mmHg - - 102 21 98 %  07/12/13 1947 140/66 mmHg 98.1 F (36.7 C) Oral 95 18 99 %    11:28 PM Reevaluation with update and discussion. After initial assessment and treatment, an updated evaluation reveals no further complaints. He remains alert. Kailena Lubas L   11:38 PM-Consult complete with Dr. Onalee Hua. Patient case explained and discussed. She agrees to admit patient for further evaluation and treatment. Call ended at 2338  Labs Review Labs Reviewed  CBC - Abnormal; Notable for the following:    WBC 12.3 (*)    RBC 3.49 (*)    Hemoglobin 12.5 (*)    HCT 36.3 (*)    MCV 104.0 (*)    MCH 35.8 (*)    Platelets 145 (*)     All other components within normal limits  COMPREHENSIVE METABOLIC PANEL - Abnormal; Notable for the following:  Potassium 3.6 (*)    Glucose, Bld 141 (*)    Albumin 3.4 (*)    GFR calc non Af Amer 73 (*)    GFR calc Af Amer 84 (*)    All other components within normal limits  URINALYSIS, ROUTINE W REFLEX MICROSCOPIC - Abnormal; Notable for the following:    APPearance HAZY (*)    Hgb urine dipstick SMALL (*)    All other components within normal limits  URINE MICROSCOPIC-ADD ON - Abnormal; Notable for the following:    Casts HYALINE CASTS (*)    All other components within normal limits  CULTURE, BLOOD (ROUTINE X 2)  CULTURE, BLOOD (ROUTINE X 2)  URINE CULTURE  TROPONIN I  INFLUENZA PANEL BY PCR (TYPE A & B, H1N1)  CG4 I-STAT (LACTIC ACID)   Imaging Review Dg Chest Portable 1 View  07/12/2013   CLINICAL DATA:  Generalized weakness. Lethargy. Cough. Current history of bronchiectasis.  EXAM: PORTABLE CHEST - 1 VIEW  COMPARISON:  Portable examination 03/28/2013, 03/26/2013. Two-view chest x-ray 10/05/2012.  FINDINGS: Cardiac silhouette mildly to moderately enlarged but stable, allowing for differences in technique. Biapical pleuroparenchymal scarring, unchanged. Chronic diffuse interstitial lung disease with basilar predominance, unchanged. No new pulmonary parenchymal abnormalities. Pleural scarring involving the costophrenic angles bilaterally.  IMPRESSION: 1. No acute cardiopulmonary disease. Stable chronic interstitial lung disease with a basilar predominance, likely interstitial fibrosis. 2. Stable cardiomegaly without pulmonary edema.   Electronically Signed   By: Evangeline Dakin M.D.   On: 07/12/2013 22:40    EKG Interpretation    Date/Time:  Wednesday July 12 2013 20:03:06 EST Ventricular Rate:  85 PR Interval:    QRS Duration: 87 QT Interval:  414 QTC Calculation: 492 R Axis:   14 Text Interpretation:  Atrial fibrillation Anterior infarct, old Borderline ST  depression, anterolateral leads Since last tracing rate slower Confirmed by Cameron Safley  MD, Cameron [name] ([id number]) on 07/12/2013 8:49:40 PM            MDM   1. Fever   2. Weakness   3. Near syncope    Near-syncope with fever. No clear cause for fever. Is being evaluated and treated for possible influenza. He'll need to be admitted for observation, and stabilization. He, apparently currently lives in his home with his wife.  Nursing Notes Reviewed/ Care Coordinated, and agree without changes. Applicable Imaging Reviewed.  Interpretation of Laboratory Data incorporated into ED treatment   Plan: Admit  Cameron Blade, MD 07/12/13 2339

## 2013-07-12 NOTE — ED Notes (Signed)
Pt reports to the ED for eval of generalized weakness, low grade fever, lethargy, and productive clear cough. Pt answers all questions appropriately. Pt is from home where he lives with his wife. Denies any CP, SOB, or N/V/D. Pt placed on 2 L nasal cannula for comfort. Pt does not wear O2 at home. VSS en route. Pt A&O4, resp e/u, and skin warm and dry.

## 2013-07-12 NOTE — ED Notes (Signed)
Lactic Acid results reported to Dr.Wentz

## 2013-07-13 ENCOUNTER — Observation Stay (HOSPITAL_COMMUNITY): Payer: Medicare Other

## 2013-07-13 DIAGNOSIS — I428 Other cardiomyopathies: Secondary | ICD-10-CM

## 2013-07-13 DIAGNOSIS — I5022 Chronic systolic (congestive) heart failure: Secondary | ICD-10-CM

## 2013-07-13 DIAGNOSIS — R509 Fever, unspecified: Secondary | ICD-10-CM

## 2013-07-13 DIAGNOSIS — R5383 Other fatigue: Principal | ICD-10-CM

## 2013-07-13 DIAGNOSIS — R197 Diarrhea, unspecified: Secondary | ICD-10-CM

## 2013-07-13 DIAGNOSIS — R5381 Other malaise: Secondary | ICD-10-CM

## 2013-07-13 DIAGNOSIS — R55 Syncope and collapse: Secondary | ICD-10-CM

## 2013-07-13 DIAGNOSIS — J189 Pneumonia, unspecified organism: Secondary | ICD-10-CM

## 2013-07-13 DIAGNOSIS — I509 Heart failure, unspecified: Secondary | ICD-10-CM

## 2013-07-13 LAB — BASIC METABOLIC PANEL
BUN: 17 mg/dL (ref 6–23)
CALCIUM: 8.5 mg/dL (ref 8.4–10.5)
CHLORIDE: 107 meq/L (ref 96–112)
CO2: 24 meq/L (ref 19–32)
CREATININE: 0.98 mg/dL (ref 0.50–1.35)
GFR calc Af Amer: 84 mL/min — ABNORMAL LOW (ref >90)
GFR calc non Af Amer: 73 mL/min — ABNORMAL LOW (ref >90)
GLUCOSE: 115 mg/dL — AB (ref 70–99)
Potassium: 3.4 mEq/L — ABNORMAL LOW (ref 3.7–5.3)
Sodium: 142 mEq/L (ref 137–147)

## 2013-07-13 LAB — CBC WITH DIFFERENTIAL/PLATELET
Basophils Absolute: 0 10*3/uL (ref 0.0–0.1)
Basophils Relative: 0 % (ref 0–1)
EOS PCT: 0 % (ref 0–5)
Eosinophils Absolute: 0 10*3/uL (ref 0.0–0.7)
HEMATOCRIT: 34.3 % — AB (ref 39.0–52.0)
Hemoglobin: 11.6 g/dL — ABNORMAL LOW (ref 13.0–17.0)
LYMPHS ABS: 1.2 10*3/uL (ref 0.7–4.0)
Lymphocytes Relative: 11 % — ABNORMAL LOW (ref 12–46)
MCH: 35.5 pg — ABNORMAL HIGH (ref 26.0–34.0)
MCHC: 33.8 g/dL (ref 30.0–36.0)
MCV: 104.9 fL — AB (ref 78.0–100.0)
Monocytes Absolute: 0.9 10*3/uL (ref 0.1–1.0)
Monocytes Relative: 8 % (ref 3–12)
Neutro Abs: 8.9 10*3/uL — ABNORMAL HIGH (ref 1.7–7.7)
Neutrophils Relative %: 81 % — ABNORMAL HIGH (ref 43–77)
PLATELETS: 129 10*3/uL — AB (ref 150–400)
RBC: 3.27 MIL/uL — AB (ref 4.22–5.81)
RDW: 13.7 % (ref 11.5–15.5)
WBC MORPHOLOGY: INCREASED
WBC: 11 10*3/uL — ABNORMAL HIGH (ref 4.0–10.5)

## 2013-07-13 LAB — LEGIONELLA ANTIGEN, URINE: Legionella Antigen, Urine: NEGATIVE

## 2013-07-13 LAB — INFLUENZA PANEL BY PCR (TYPE A & B)
H1N1 flu by pcr: NOT DETECTED
Influenza A By PCR: NEGATIVE
Influenza B By PCR: NEGATIVE

## 2013-07-13 LAB — STREP PNEUMONIAE URINARY ANTIGEN: Strep Pneumo Urinary Antigen: NEGATIVE

## 2013-07-13 MED ORDER — METOPROLOL SUCCINATE ER 25 MG PO TB24
25.0000 mg | ORAL_TABLET | Freq: Every day | ORAL | Status: DC
  Administered 2013-07-13 – 2013-07-17 (×5): 25 mg via ORAL
  Filled 2013-07-13 (×5): qty 1

## 2013-07-13 MED ORDER — DIGOXIN 125 MCG PO TABS
0.1250 mg | ORAL_TABLET | Freq: Every day | ORAL | Status: DC
  Administered 2013-07-13 – 2013-07-17 (×5): 0.125 mg via ORAL
  Filled 2013-07-13 (×5): qty 1

## 2013-07-13 MED ORDER — ALBUTEROL SULFATE (2.5 MG/3ML) 0.083% IN NEBU: 2.5000 mg | INHALATION_SOLUTION | Freq: Four times a day (QID) | RESPIRATORY_TRACT | Status: DC | PRN

## 2013-07-13 MED ORDER — MENTHOL 3 MG MT LOZG
1.0000 | LOZENGE | OROMUCOSAL | Status: DC | PRN
  Administered 2013-07-13: 3 mg via ORAL
  Filled 2013-07-13: qty 9

## 2013-07-13 MED ORDER — SODIUM CHLORIDE 0.9 % IV SOLN: 250.0000 mL | INTRAVENOUS | Status: DC | PRN

## 2013-07-13 MED ORDER — SODIUM CHLORIDE 0.9 % IJ SOLN
3.0000 mL | Freq: Two times a day (BID) | INTRAMUSCULAR | Status: DC
  Administered 2013-07-13: 3 mL via INTRAVENOUS

## 2013-07-13 MED ORDER — APIXABAN 5 MG PO TABS
5.0000 mg | ORAL_TABLET | Freq: Two times a day (BID) | ORAL | Status: DC
  Administered 2013-07-13 – 2013-07-17 (×9): 5 mg via ORAL
  Filled 2013-07-13 (×11): qty 1

## 2013-07-13 MED ORDER — SODIUM CHLORIDE 0.9 % IJ SOLN: 3.0000 mL | INTRAMUSCULAR | Status: DC | PRN

## 2013-07-13 MED ORDER — LEVOFLOXACIN 750 MG PO TABS
750.0000 mg | ORAL_TABLET | Freq: Every day | ORAL | Status: DC
  Administered 2013-07-14 – 2013-07-17 (×4): 750 mg via ORAL
  Filled 2013-07-13 (×4): qty 1

## 2013-07-13 MED ORDER — LEVOFLOXACIN IN D5W 750 MG/150ML IV SOLN
750.0000 mg | Freq: Every day | INTRAVENOUS | Status: DC
  Administered 2013-07-13: 750 mg via INTRAVENOUS
  Filled 2013-07-13 (×2): qty 150

## 2013-07-13 MED ORDER — DILTIAZEM HCL ER COATED BEADS 120 MG PO CP24
120.0000 mg | ORAL_CAPSULE | Freq: Every day | ORAL | Status: DC
  Administered 2013-07-13 – 2013-07-17 (×4): 120 mg via ORAL
  Filled 2013-07-13 (×6): qty 1

## 2013-07-13 MED ORDER — OSELTAMIVIR PHOSPHATE 30 MG PO CAPS
30.0000 mg | ORAL_CAPSULE | Freq: Two times a day (BID) | ORAL | Status: DC
  Administered 2013-07-13: 30 mg via ORAL
  Filled 2013-07-13 (×2): qty 1

## 2013-07-13 MED ORDER — DONEPEZIL HCL 10 MG PO TABS
10.0000 mg | ORAL_TABLET | Freq: Every day | ORAL | Status: DC
  Administered 2013-07-13 – 2013-07-16 (×4): 10 mg via ORAL
  Filled 2013-07-13 (×6): qty 1

## 2013-07-13 MED ORDER — LEVOTHYROXINE SODIUM 75 MCG PO TABS
75.0000 ug | ORAL_TABLET | Freq: Every day | ORAL | Status: DC
  Administered 2013-07-13 – 2013-07-17 (×5): 75 ug via ORAL
  Filled 2013-07-13 (×7): qty 1

## 2013-07-13 MED ORDER — SODIUM CHLORIDE 0.9 % IV SOLN: INTRAVENOUS | Status: DC

## 2013-07-13 MED ORDER — ACETAMINOPHEN 325 MG PO TABS
650.0000 mg | ORAL_TABLET | Freq: Four times a day (QID) | ORAL | Status: DC | PRN
  Administered 2013-07-13 – 2013-07-17 (×2): 650 mg via ORAL
  Filled 2013-07-13: qty 2

## 2013-07-13 MED ORDER — FUROSEMIDE 20 MG PO TABS
20.0000 mg | ORAL_TABLET | Freq: Every day | ORAL | Status: DC
  Administered 2013-07-13 – 2013-07-17 (×5): 20 mg via ORAL
  Filled 2013-07-13 (×5): qty 1

## 2013-07-13 MED ORDER — ALBUTEROL SULFATE HFA 108 (90 BASE) MCG/ACT IN AERS: 1.0000 | INHALATION_SPRAY | Freq: Four times a day (QID) | RESPIRATORY_TRACT | Status: DC | PRN

## 2013-07-13 NOTE — Evaluation (Signed)
Physical Therapy Evaluation Patient Details Name: Cameron [name] MRN: [id number] DOB: [birth year] Today's Date: 07/13/2013 Time: 3244-0102 PT Time Calculation (min): 42 min  PT Assessment / Plan / Recommendation History of Present Illness  80 yo male comes in from home lives with wife after almost passing out while having an episode of diarrhea while sitting on the commode tonight.  His wife was nearby, so caught him as he was slipping to ground, no head trauma.  Pt states that he got dizzy.  Did not know he was running fever.  Pt has h/o dementia adv per ED staff but he appears cognitively intact.  He says he has been coughing.  No n/v.  Just one episode of diarrhea today.  Reports eating and drinking normally.  No cp.  No sob.  No abd pain.  No le edema or swelling.   Clinical Impression  Patient demonstrates deficits in functional mobility as indicated below. Pt will benefit from continued skilled PT to address deficits and maximize independence. Will continue to see as indicated. Recommend discharge with family supervision and PRN assist.    PT Assessment  Patient needs continued PT services    Follow Up Recommendations  No PT follow up;Supervision/Assistance - 24 hour          Equipment Recommendations  None recommended by PT       Frequency Min 4X/week    Precautions / Restrictions Precautions Precautions: Fall Precaution Comments: droplet   Pertinent Vitals/Pain NAD, some chest soreness reported with cough, sore throat      Mobility  Bed Mobility Overal bed mobility: Modified Independent General bed mobility comments: increased time Transfers Overall transfer level: Needs assistance Equipment used: None Transfers: Sit to/from Stand Sit to Stand: Min guard Ambulation/Gait Ambulation/Gait assistance: Min guard Ambulation Distance (Feet): 260 Feet Assistive device: None Gait Pattern/deviations: Step-through pattern;Decreased stride length;Drifts right/left;Narrow  base of support Gait velocity: decreased Gait velocity interpretation: <1.8 ft/sec, indicative of risk for recurrent falls General Gait Details: some instability noted, generalized fatigue        PT Diagnosis: Difficulty walking;Abnormality of gait;Generalized weakness  PT Problem List: Decreased strength;Decreased activity tolerance;Decreased balance;Decreased mobility PT Treatment Interventions: DME instruction;Gait training;Stair training;Functional mobility training;Therapeutic activities;Therapeutic exercise;Balance training;Patient/family education     PT Goals(Current goals can be found in the care plan section) Acute Rehab PT Goals Patient Stated Goal: to get feeling better PT Goal Formulation: With patient Time For Goal Achievement: 07/27/13 Potential to Achieve Goals: Good  Visit Information  Last PT Received On: 07/13/13 Assistance Needed: +1 History of Present Illness: 80 yo male comes in from home lives with wife after almost passing out while having an episode of diarrhea while sitting on the commode tonight.  His wife was nearby, so caught him as he was slipping to ground, no head trauma.  Pt states that he got dizzy.  Did not know he was running fever.  Pt has h/o dementia adv per ED staff but he appears cognitively intact.  He says he has been coughing.  No n/v.  Just one episode of diarrhea today.  Reports eating and drinking normally.  No cp.  No sob.  No abd pain.  No le edema or swelling.        Prior [place] expects to be discharged to:: Private residence Living Arrangements: Spouse/significant other Available Help at Discharge: Family;Available PRN/intermittently Type of Home: Apartment Home Access: Stairs to enter Home Layout: One level;Able to live on main level  with bedroom/bathroom Home Equipment: Cane - single point Prior Function Level of Independence: Independent Communication Communication: HOH Dominant Hand: Right     Cognition  Cognition Arousal/Alertness: Awake/alert Behavior During Therapy: WFL for tasks assessed/performed Overall Cognitive Status: Within Functional Limits for tasks assessed    Extremity/Trunk Assessment Upper Extremity Assessment Upper Extremity Assessment: Generalized weakness Lower Extremity Assessment Lower Extremity Assessment: Generalized weakness   Balance General Comments General comments (skin integrity, edema, etc.): recieved patient saturated in liquid stool, assisted patient with hygiene, bathing, and pericare. Patient did use urinal, urine dark tea color  End of Session PT - End of Session Equipment Utilized During Treatment: Gait belt Activity Tolerance: Patient limited by fatigue Patient left: in bed;with call bell/phone within reach;with bed alarm set Nurse Communication: Mobility status (incontinent of liquid stool, dark urine)  GP Functional Assessment Tool Used: clinical judgement Functional Limitation: Mobility: Walking and moving around Mobility: Walking and Moving Around Current Status (H2122): At least 20 percent but less than 40 percent impaired, limited or restricted Mobility: Walking and Moving Around Goal Status [phone]): At least 1 percent but less than 20 percent impaired, limited or restricted   Cameron [name] 07/13/2013, [place], [place] DPT  [phone]

## 2013-07-13 NOTE — H&P (Signed)
PCP:   Kandice Hams, MD   Chief Complaint:  Almost passed out  HPI: 80 yo male comes in from home lives with wife after almost passing out while having an episode of diarrhea while sitting on the commode tonight.  His wife was nearby, so caught him as he was slipping to ground, no head trauma.  Pt states that he got dizzy.  Did not know he was running fever.  Pt has h/o dementia adv per ED staff but he appears cognitively intact.  He says he has been coughing.  No n/v.  Just one episode of diarrhea today.  Reports eating and drinking normally.  No cp.  No sob.  No abd pain.  No le edema or swelling.    Review of Systems:  Positive and negative as per HPI otherwise all other systems are negative  Past Medical History: Past Medical History  Diagnosis Date  . GERD (gastroesophageal reflux disease)   . CHF (congestive heart failure)   . Dysrhythmia     atrial fibrilation  . COPD (chronic obstructive pulmonary disease)   . Arthritis    Past Surgical History  Procedure Laterality Date  . Hernia repair    . Tonsillectomy    . Hemmroidectmy      Medications: Prior to Admission medications   Medication Sig Start Date End Date Taking? Authorizing Provider  albuterol (PROVENTIL HFA;VENTOLIN HFA) 108 (90 BASE) MCG/ACT inhaler Inhale 1-2 puffs into the lungs every 6 (six) hours as needed for wheezing or shortness of breath.   Yes Historical Provider, MD  apixaban (ELIQUIS) 5 MG TABS tablet Take 1 tablet (5 mg total) by mouth 2 (two) times daily. 10/09/12  Yes Neena Rhymes, MD  digoxin (LANOXIN) 0.125 MG tablet Take 0.125 mg by mouth daily.   Yes Historical Provider, MD  diltiazem (CARDIZEM CD) 120 MG 24 hr capsule Take 120 mg by mouth daily.   Yes Historical Provider, MD  donepezil (ARICEPT) 10 MG tablet Take 1 tablet (10 mg total) by mouth at bedtime. 10/09/12  Yes Neena Rhymes, MD  furosemide (LASIX) 20 MG tablet Take 20 mg by mouth daily.   Yes Historical Provider, MD   Glucosamine-Chondroit-Vit C-Mn (GLUCOSAMINE 1500 COMPLEX) CAPS Take 1 capsule by mouth daily.   Yes Historical Provider, MD  levothyroxine (SYNTHROID, LEVOTHROID) 75 MCG tablet Take 1 tablet (75 mcg total) by mouth daily before breakfast. 10/09/12  Yes Neena Rhymes, MD  metoprolol succinate (TOPROL-XL) 25 MG 24 hr tablet Take 25 mg by mouth daily.   Yes Historical Provider, MD  omeprazole (PRILOSEC OTC) 20 MG tablet Take 20 mg by mouth daily.   Yes Historical Provider, MD  vitamin B-12 (CYANOCOBALAMIN) 500 MCG tablet Take 500 mcg by mouth daily.   Yes Historical Provider, MD    Allergies:  No Known Allergies  Social History:  reports that he quit smoking about 52 years ago. His smoking use included Cigarettes. He has a 80 pack-year smoking history. He has never used smokeless tobacco. He reports that he drinks about 1.2 ounces of alcohol per week. He reports that he does not use illicit drugs.  Family History: Family History  Problem Relation Age of Onset  . Heart attack Mother   . Pulmonary embolism Father     Physical Exam: Filed Vitals:   07/12/13 2215 07/12/13 2230 07/12/13 2245 07/12/13 2345  BP: 121/58 109/50 104/46 108/44  Pulse: 73 90 86 81  Temp:      TempSrc:  Resp: 25 29 26    SpO2: 98% 96% 96% 96%   General appearance: alert, cooperative and no distress Head: Normocephalic, without obvious abnormality, atraumatic Eyes: negative Nose: Nares normal. Septum midline. Mucosa normal. No drainage or sinus tenderness. Neck: no JVD and supple, symmetrical, trachea midline Lungs: rhonchi RLL Heart: regular rate and rhythm, S1, S2 normal, no murmur, click, rub or gallop Abdomen: soft, non-tender; bowel sounds normal; no masses,  no organomegaly Extremities: extremities normal, atraumatic, no cyanosis or edema Pulses: 2+ and symmetric Skin: Skin color, texture, turgor normal. No rashes or lesions Neurologic: Grossly normal  Labs on Admission:   Recent Labs   07/12/13 2016  NA 140  K 3.6*  CL 103  CO2 25  GLUCOSE 141*  BUN 19  CREATININE 0.98  CALCIUM 9.2    Recent Labs  07/12/13 2016  AST 23  ALT 14  ALKPHOS 54  BILITOT 0.4  PROT 7.4  ALBUMIN 3.4*    Recent Labs  07/12/13 2016  WBC 12.3*  HGB 12.5*  HCT 36.3*  MCV 104.0*  PLT 145*    Recent Labs  07/12/13 2016  TROPONINI <0.30   Radiological Exams on Admission: Dg Chest Portable 1 View  07/12/2013   CLINICAL DATA:  Generalized weakness. Lethargy. Cough. Current history of bronchiectasis.  EXAM: PORTABLE CHEST - 1 VIEW  COMPARISON:  Portable examination 03/28/2013, 03/26/2013. Two-view chest x-ray 10/05/2012.  FINDINGS: Cardiac silhouette mildly to moderately enlarged but stable, allowing for differences in technique. Biapical pleuroparenchymal scarring, unchanged. Chronic diffuse interstitial lung disease with basilar predominance, unchanged. No new pulmonary parenchymal abnormalities. Pleural scarring involving the costophrenic angles bilaterally.  IMPRESSION: 1. No acute cardiopulmonary disease. Stable chronic interstitial lung disease with a basilar predominance, likely interstitial fibrosis. 2. Stable cardiomegaly without pulmonary edema.   Electronically Signed   By: Evangeline Dakin M.D.   On: 07/12/2013 22:40    Assessment/Plan 80 yo male with fever probably pna  Principal Problem:   Fever- ua and cxr unrevealing.  bc pending.  quickflu is pending.  Cover with levaquin and tamiflu.  Active Problems:   PULMONARY FIBROSIS, POSTINFLAMMATORY   NICM (nonischemic cardiomyopathy) EF 26%   Chronic systolic CHF (congestive heart failure)   Dementia must be mild   Weakness generalized ck orthostatics   Diarrhea ck cdiff    Sumaiya Arruda A 07/13/2013, 12:21 AM

## 2013-07-13 NOTE — Progress Notes (Signed)
Subjective: Patient feels at his baseline. He thinks he may have had a loose stool. He does have Occasional cough  Objective: Vital signs in last 24 hours: Temp:  [98.1 F (36.7 C)-102.1 F (38.9 C)] 100 F (37.8 C) (01/15 0635) Pulse Rate:  [73-105] 90 (01/15 0635) Resp:  [18-29] 18 (01/15 0635) BP: (104-140)/(44-67) 119/56 mmHg (01/15 0635) SpO2:  [94 %-99 %] 99 % (01/15 0635) Weight:  [64.3 kg (141 lb 12.1 oz)] 64.3 kg (141 lb 12.1 oz) (01/15 0027) Weight change:  Last BM Date: 07/12/13  Intake/Output from previous day:   Intake/Output this shift:    General appearance: cooperative Resp: rhonchi at the bases bilateral Cardio: irregularly irregular rhythm Extremities: extremities normal, atraumatic, no cyanosis or edema  Lab Results:  Results for orders placed during the hospital encounter of 07/12/13 (from the past 24 hour(s))  CBC     Status: Abnormal   Collection Time    07/12/13  8:16 PM      Result Value Range   WBC 12.3 (*) 4.0 - 10.5 K/uL   RBC 3.49 (*) 4.22 - 5.81 MIL/uL   Hemoglobin 12.5 (*) 13.0 - 17.0 g/dL   HCT 36.3 (*) 39.0 - 52.0 %   MCV 104.0 (*) 78.0 - 100.0 fL   MCH 35.8 (*) 26.0 - 34.0 pg   MCHC 34.4  30.0 - 36.0 g/dL   RDW 13.7  11.5 - 15.5 %   Platelets 145 (*) 150 - 400 K/uL  COMPREHENSIVE METABOLIC PANEL     Status: Abnormal   Collection Time    07/12/13  8:16 PM      Result Value Range   Sodium 140  137 - 147 mEq/L   Potassium 3.6 (*) 3.7 - 5.3 mEq/L   Chloride 103  96 - 112 mEq/L   CO2 25  19 - 32 mEq/L   Glucose, Bld 141 (*) 70 - 99 mg/dL   BUN 19  6 - 23 mg/dL   Creatinine, Ser 0.98  0.50 - 1.35 mg/dL   Calcium 9.2  8.4 - 10.5 mg/dL   Total Protein 7.4  6.0 - 8.3 g/dL   Albumin 3.4 (*) 3.5 - 5.2 g/dL   AST 23  0 - 37 U/L   ALT 14  0 - 53 U/L   Alkaline Phosphatase 54  39 - 117 U/L   Total Bilirubin 0.4  0.3 - 1.2 mg/dL   GFR calc non Af Amer 73 (*) >90 mL/min   GFR calc Af Amer 84 (*) >90 mL/min  TROPONIN I     Status: None    Collection Time    07/12/13  8:16 PM      Result Value Range   Troponin I <0.30  <0.30 ng/mL  CG4 I-STAT (LACTIC ACID)     Status: None   Collection Time    07/12/13  8:50 PM      Result Value Range   Lactic Acid, Venous 1.61  0.5 - 2.2 mmol/L  URINALYSIS, ROUTINE W REFLEX MICROSCOPIC     Status: Abnormal   Collection Time    07/12/13 10:30 PM      Result Value Range   Color, Urine YELLOW  YELLOW   APPearance HAZY (*) CLEAR   Specific Gravity, Urine 1.017  1.005 - 1.030   pH 5.0  5.0 - 8.0   Glucose, UA NEGATIVE  NEGATIVE mg/dL   Hgb urine dipstick SMALL (*) NEGATIVE   Bilirubin Urine NEGATIVE  NEGATIVE  Ketones, ur NEGATIVE  NEGATIVE mg/dL   Protein, ur NEGATIVE  NEGATIVE mg/dL   Urobilinogen, UA 0.2  0.0 - 1.0 mg/dL   Nitrite NEGATIVE  NEGATIVE   Leukocytes, UA NEGATIVE  NEGATIVE  URINE MICROSCOPIC-ADD ON     Status: Abnormal   Collection Time    07/12/13 10:30 PM      Result Value Range   Squamous Epithelial / LPF RARE  RARE   WBC, UA 0-2  <3 WBC/hpf   RBC / HPF 3-6  <3 RBC/hpf   Bacteria, UA RARE  RARE   Casts HYALINE CASTS (*) NEGATIVE   Urine-Other AMORPHOUS URATES/PHOSPHATES    STREP PNEUMONIAE URINARY ANTIGEN     Status: None   Collection Time    07/12/13 10:30 PM      Result Value Range   Strep Pneumo Urinary Antigen NEGATIVE  NEGATIVE  CBC WITH DIFFERENTIAL     Status: Abnormal   Collection Time    07/13/13  1:25 AM      Result Value Range   WBC 11.0 (*) 4.0 - 10.5 K/uL   RBC 3.27 (*) 4.22 - 5.81 MIL/uL   Hemoglobin 11.6 (*) 13.0 - 17.0 g/dL   HCT 34.3 (*) 39.0 - 52.0 %   MCV 104.9 (*) 78.0 - 100.0 fL   MCH 35.5 (*) 26.0 - 34.0 pg   MCHC 33.8  30.0 - 36.0 g/dL   RDW 13.7  11.5 - 15.5 %   Platelets 129 (*) 150 - 400 K/uL   Neutrophils Relative % 81 (*) 43 - 77 %   Lymphocytes Relative 11 (*) 12 - 46 %   Monocytes Relative 8  3 - 12 %   Eosinophils Relative 0  0 - 5 %   Basophils Relative 0  0 - 1 %   Neutro Abs 8.9 (*) 1.7 - 7.7 K/uL   Lymphs Abs  1.2  0.7 - 4.0 K/uL   Monocytes Absolute 0.9  0.1 - 1.0 K/uL   Eosinophils Absolute 0.0  0.0 - 0.7 K/uL   Basophils Absolute 0.0  0.0 - 0.1 K/uL   WBC Morphology INCREASED BANDS (>20% BANDS)    BASIC METABOLIC PANEL     Status: Abnormal   Collection Time    07/13/13  1:25 AM      Result Value Range   Sodium 142  137 - 147 mEq/L   Potassium 3.4 (*) 3.7 - 5.3 mEq/L   Chloride 107  96 - 112 mEq/L   CO2 24  19 - 32 mEq/L   Glucose, Bld 115 (*) 70 - 99 mg/dL   BUN 17  6 - 23 mg/dL   Creatinine, Ser 0.98  0.50 - 1.35 mg/dL   Calcium 8.5  8.4 - 10.5 mg/dL   GFR calc non Af Amer 73 (*) >90 mL/min   GFR calc Af Amer 84 (*) >90 mL/min      Studies/Results: Dg Chest Portable 1 View  07/12/2013   CLINICAL DATA:  Generalized weakness. Lethargy. Cough. Current history of bronchiectasis.  EXAM: PORTABLE CHEST - 1 VIEW  COMPARISON:  Portable examination 03/28/2013, 03/26/2013. Two-view chest x-ray 10/05/2012.  FINDINGS: Cardiac silhouette mildly to moderately enlarged but stable, allowing for differences in technique. Biapical pleuroparenchymal scarring, unchanged. Chronic diffuse interstitial lung disease with basilar predominance, unchanged. No new pulmonary parenchymal abnormalities. Pleural scarring involving the costophrenic angles bilaterally.  IMPRESSION: 1. No acute cardiopulmonary disease. Stable chronic interstitial lung disease with a basilar predominance, likely interstitial fibrosis. 2.  Stable cardiomegaly without pulmonary edema.   Electronically Signed   By: Evangeline Dakin M.D.   On: 07/12/2013 22:40    Medications:  Prior to Admission:  Prescriptions prior to admission  Medication Sig Dispense Refill  . albuterol (PROVENTIL HFA;VENTOLIN HFA) 108 (90 BASE) MCG/ACT inhaler Inhale 1-2 puffs into the lungs every 6 (six) hours as needed for wheezing or shortness of breath.      Marland Kitchen apixaban (ELIQUIS) 5 MG TABS tablet Take 1 tablet (5 mg total) by mouth 2 (two) times daily.  60 tablet  5   . digoxin (LANOXIN) 0.125 MG tablet Take 0.125 mg by mouth daily.      Marland Kitchen diltiazem (CARDIZEM CD) 120 MG 24 hr capsule Take 120 mg by mouth daily.      Marland Kitchen donepezil (ARICEPT) 10 MG tablet Take 1 tablet (10 mg total) by mouth at bedtime.  30 tablet  5  . furosemide (LASIX) 20 MG tablet Take 20 mg by mouth daily.      . Glucosamine-Chondroit-Vit C-Mn (GLUCOSAMINE 1500 COMPLEX) CAPS Take 1 capsule by mouth daily.      Marland Kitchen levothyroxine (SYNTHROID, LEVOTHROID) 75 MCG tablet Take 1 tablet (75 mcg total) by mouth daily before breakfast.  30 tablet  5  . metoprolol succinate (TOPROL-XL) 25 MG 24 hr tablet Take 25 mg by mouth daily.      Marland Kitchen omeprazole (PRILOSEC OTC) 20 MG tablet Take 20 mg by mouth daily.      . vitamin B-12 (CYANOCOBALAMIN) 500 MCG tablet Take 500 mcg by mouth daily.       Scheduled: . apixaban  5 mg Oral BID  . digoxin  0.125 mg Oral Daily  . diltiazem  120 mg Oral Daily  . donepezil  10 mg Oral QHS  . furosemide  20 mg Oral Daily  . levofloxacin (LEVAQUIN) IV  750 mg Intravenous Q0600  . levothyroxine  75 mcg Oral QAC breakfast  . metoprolol succinate  25 mg Oral Daily  . oseltamivir  30 mg Oral BID  . sodium chloride  3 mL Intravenous Q12H   Continuous:  [id number] chloride, albuterol, sodium chloride  Assessment/Plan: Fever, cough an elderly male now with rhonchi on exam. Patient on antibiotics to cover empirically for pneumonia as well as flu. Influenza results are pending. We will check followup x-ray , patient did receive IV fluids. If he was dehydrated, pneumonia may not have shown up an initial x-ray. Patient's daughter updated this morning via the phone. Atrial fibrillation Dementia Depression Diarrhea, try to obtain a stool sample if persistent problem.  LOS: 1 day   Donnivan Villena D 07/13/2013, 10:05 AM

## 2013-07-13 NOTE — Progress Notes (Signed)
UR completed 

## 2013-07-14 LAB — CBC
HEMATOCRIT: 37.1 % — AB (ref 39.0–52.0)
HEMOGLOBIN: 12.6 g/dL — AB (ref 13.0–17.0)
MCH: 35 pg — ABNORMAL HIGH (ref 26.0–34.0)
MCHC: 34 g/dL (ref 30.0–36.0)
MCV: 103.1 fL — ABNORMAL HIGH (ref 78.0–100.0)
Platelets: 136 10*3/uL — ABNORMAL LOW (ref 150–400)
RBC: 3.6 MIL/uL — ABNORMAL LOW (ref 4.22–5.81)
RDW: 13.2 % (ref 11.5–15.5)
WBC: 8.8 10*3/uL (ref 4.0–10.5)

## 2013-07-14 LAB — URINE CULTURE
Colony Count: NO GROWTH
Culture: NO GROWTH
SPECIAL REQUESTS: NORMAL

## 2013-07-14 LAB — BASIC METABOLIC PANEL
BUN: 16 mg/dL (ref 6–23)
CO2: 23 mEq/L (ref 19–32)
CREATININE: 0.95 mg/dL (ref 0.50–1.35)
Calcium: 8.7 mg/dL (ref 8.4–10.5)
Chloride: 104 mEq/L (ref 96–112)
GFR calc Af Amer: 86 mL/min — ABNORMAL LOW (ref >90)
GFR, EST NON AFRICAN AMERICAN: 74 mL/min — AB (ref >90)
GLUCOSE: 86 mg/dL (ref 70–99)
POTASSIUM: 3.6 meq/L — AB (ref 3.7–5.3)
Sodium: 140 mEq/L (ref 137–147)

## 2013-07-14 NOTE — Plan of Care (Signed)
Dr polite notifed and md states to try and collect sample for Cdiff. No further orders.

## 2013-07-14 NOTE — Progress Notes (Signed)
Physical Therapy Treatment Patient Details Name: Cameron [name] MRN: [id number] DOB: 1926/04/20 Today's Date: 07/14/2013 Time: 1410-1436 PT Time Calculation (min): 26 min  PT Assessment / Plan / Recommendation  History of Present Illness 80 yo male comes in from home lives with wife after almost passing out while having an episode of diarrhea while sitting on the commode tonight.  His wife was nearby, so caught him as he was slipping to ground, no head trauma.  Pt states that he got dizzy.  Did not know he was running fever.  Pt has h/o dementia adv per ED staff but he appears cognitively intact.  He says he has been coughing.  No n/v.  Just one episode of diarrhea today.  Reports eating and drinking normally.  No cp.  No sob.  No abd pain.  No le edema or swelling.    PT Comments   Patient decline in functional mobility compared to prior session. Patient appears congitively intact and is A&Ox3 but patient demonstrates higher level congitive deficits.  Patient stated that he had been sitting in feces (liquid stool) since before lunch, when asked why he didn't ring patient said "oh i am not really worried about it" I explained to patient that this was not appropriate and that he was at risk for skin breakdown if he did not ring for assist to get cleaned up, patient responded by saying "thats okay, I'm not worried about my skin".    Patient had liquid blood tinged stool upon arrival, and then had a second liquid stool in toilet during session. Patient required more assist with ambulation today.  Patient with several LOBs and demonstrates very unsteady staggering gait. Given current changes in mobility and function, discharge recommendations updated. Feel patient will need use of assistive device, as well as hands on physical assist when ambulation. Continue to recommend 24/7 assist, patient now considered significant fall risk. If family can not provide adequate assist patient may need ST SNF upon  discharge.  If assist can be arranged by family, then recommend HHPT, 24 hr assist and use of assistive device upon discharge.   Follow Up Recommendations   Home health PT;Supervision/Assistance - 24 hour        Barriers to Discharge  Mobility decline      Equipment Recommendations  Rolling walker with 5" wheels       Frequency Min 4X/week   Progress towards PT Goals Progress towards PT goals: Not progressing toward goals - comment  Plan Other (comment) (Patient needs to have hands on 24/7 at discharge )    Precautions / Restrictions Precautions Precautions: Fall Restrictions Weight Bearing Restrictions: No   Pertinent Vitals/Pain No pain at this time    Mobility  Bed Mobility Overal bed mobility: Needs Assistance Bed Mobility: Sit to Supine Sit to supine: Mod assist General bed mobility comments: patient required increased assist to come to EOB today, decreased processing of commands and assist to pulling to elevated positions. Patient could not follow sequencing instructions provided. Transfers Overall transfer level: Needs assistance Equipment used: None Transfers: Sit to/from Stand Sit to Stand: Min guard;Min assist General transfer comment: unsteady upon standing, assist to stabilize Ambulation/Gait Ambulation/Gait assistance: Mod assist Ambulation Distance (Feet): 260 Feet Assistive device: None Gait Pattern/deviations: Step-through pattern;Decreased stride length;Drifts right/left;Narrow base of support Gait velocity: decreased Gait velocity interpretation: <1.8 ft/sec, indicative of risk for recurrent falls General Gait Details: significant increase in instability today, patient stagger with gait, with several complete LOB, this is  much worse then yesterday's evaluation of gait. At this time I would recommend use of RW next session.       PT Goals (current goals can now be found in the care plan section) Acute Rehab PT Goals Patient Stated Goal: to get  feeling better PT Goal Formulation: With patient Time For Goal Achievement: 07/27/13 Potential to Achieve Goals: Good  Visit Information  Last PT Received On: 07/14/13 Assistance Needed: +1 History of Present Illness: 80 yo male comes in from home lives with wife after almost passing out while having an episode of diarrhea while sitting on the commode tonight.  His wife was nearby, so caught him as he was slipping to ground, no head trauma.  Pt states that he got dizzy.  Did not know he was running fever.  Pt has h/o dementia adv per ED staff but he appears cognitively intact.  He says he has been coughing.  No n/v.  Just one episode of diarrhea today.  Reports eating and drinking normally.  No cp.  No sob.  No abd pain.  No le edema or swelling.     Subjective Data  Subjective: I had a loose stool again Patient Stated Goal: to get feeling better   Cognition  Cognition Arousal/Alertness: Awake/alert Behavior During Therapy: WFL for tasks assessed/performed Overall Cognitive Status: Impaired/Different from baseline Area of Impairment: Safety/judgement;Awareness;Problem solving Safety/Judgement: Decreased awareness of safety Awareness: Emergent Problem Solving: Slow processing;Difficulty sequencing;Requires verbal cues;Requires tactile cues General Comments: Patient appears congitively intact and is A&Ox3 but patient demonstrates higher level congitive deficits.  Patient stated that he had been sitting in feces (liquid stool) since before lunch, when asked why he didn't ring patient said "oh i am not really worried about it" I explained to patient that this was not appropriate and that he was at risk for skin breakdown if he did not ring for assist to get cleaned up, patient responded by saying "thats okay, I'm not worried about my skin"    Balance  Balance Overall balance assessment: Needs assistance Standing balance support: During functional activity Standing balance-Leahy Scale:  Poor General Comments General comments (skin integrity, edema, etc.): once again patient recieved incontinent of liquid unformed stool, Today's had bloody tinge. Once patient cleaned and was ready for mobility, patient again had a second BM of liquid stool in toilet. Nsg aware  End of Session PT - End of Session Equipment Utilized During Treatment: Gait belt Activity Tolerance: Patient limited by fatigue Patient left: in chair;with call bell/phone within reach Nurse Communication: Mobility status (incontinent of liquid stool, dark urine)   GP     Duncan Dull 07/14/2013, 2:47 PM Alben Deeds, [place] DPT  [phone]

## 2013-07-14 NOTE — Progress Notes (Signed)
Subjective: Patient without any complaints. He may have had a loose stool yesterday but has not documented. He is tolerating oral intake, he's been ambulated by physical therapy without any problems. Followup x-ray no definitive pneumonia however he does have a wet cough after IV fluids. He remains afebrile,  Objective: Vital signs in last 24 hours: Temp:  [97.9 F (36.6 C)-98.5 F (36.9 C)] 97.9 F (36.6 C) (01/16 1107) Pulse Rate:  [78-98] 81 (01/16 1107) Resp:  [18-20] 20 (01/16 1107) BP: (107-125)/(47-72) 107/72 mmHg (01/16 1107) SpO2:  [95 %-99 %] 97 % (01/16 1107) Weight change:  Last BM Date: 07/13/13  Intake/Output from previous day: 01/15 0701 - 01/16 0700 In: -  Out: 100 [Urine:100] Intake/Output this shift:    General appearance: alert and cooperative Resp: bibasilar rhonchi in the base Cardio: irregularly irregular rhythm Extremities: extremities normal, atraumatic, no cyanosis or edema  Lab Results:  Results for orders placed during the hospital encounter of 07/12/13 (from the past 24 hour(s))  CBC     Status: Abnormal   Collection Time    07/14/13  5:07 AM      Result Value Range   WBC 8.8  4.0 - 10.5 K/uL   RBC 3.60 (*) 4.22 - 5.81 MIL/uL   Hemoglobin 12.6 (*) 13.0 - 17.0 g/dL   HCT 37.1 (*) 39.0 - 52.0 %   MCV 103.1 (*) 78.0 - 100.0 fL   MCH 35.0 (*) 26.0 - 34.0 pg   MCHC 34.0  30.0 - 36.0 g/dL   RDW 13.2  11.5 - 15.5 %   Platelets 136 (*) 150 - 400 K/uL  BASIC METABOLIC PANEL     Status: Abnormal   Collection Time    07/14/13  5:07 AM      Result Value Range   Sodium 140  137 - 147 mEq/L   Potassium 3.6 (*) 3.7 - 5.3 mEq/L   Chloride 104  96 - 112 mEq/L   CO2 23  19 - 32 mEq/L   Glucose, Bld 86  70 - 99 mg/dL   BUN 16  6 - 23 mg/dL   Creatinine, Ser 0.95  0.50 - 1.35 mg/dL   Calcium 8.7  8.4 - 10.5 mg/dL   GFR calc non Af Amer 74 (*) >90 mL/min   GFR calc Af Amer 86 (*) >90 mL/min      Studies/Results: Dg Chest 2 View  07/13/2013    CLINICAL DATA:  Shortness of breath.  EXAM: CHEST  2 VIEW  COMPARISON:  07/12/2013.  FINDINGS: Mediastinum and hilar structures normal. Cardiomegaly. Pulmonary vascularity is normal. Biapical pleural thickening is noted consistent with scarring. Interstitial changes consistent fibrosis noted. Chest is stable from prior exam. No acute bony abnormality identified.  IMPRESSION: Stable changes of interstitial fibrosis and pleural parenchymal scarring.   Electronically Signed   By: [place]   On: 07/13/2013 14:10   Dg Chest Portable 1 View  07/12/2013   CLINICAL DATA:  Generalized weakness. Lethargy. Cough. Current history of bronchiectasis.  EXAM: PORTABLE CHEST - 1 VIEW  COMPARISON:  Portable examination 03/28/2013, 03/26/2013. Two-view chest x-ray 10/05/2012.  FINDINGS: Cardiac silhouette mildly to moderately enlarged but stable, allowing for differences in technique. Biapical pleuroparenchymal scarring, unchanged. Chronic diffuse interstitial lung disease with basilar predominance, unchanged. No new pulmonary parenchymal abnormalities. Pleural scarring involving the costophrenic angles bilaterally.  IMPRESSION: 1. No acute cardiopulmonary disease. Stable chronic interstitial lung disease with a basilar predominance, likely interstitial fibrosis. 2. Stable cardiomegaly without pulmonary  edema.   Electronically Signed   By: Evangeline Dakin M.D.   On: 07/12/2013 22:40    Medications:  Prior to Admission:  Prescriptions prior to admission  Medication Sig Dispense Refill  . albuterol (PROVENTIL HFA;VENTOLIN HFA) 108 (90 BASE) MCG/ACT inhaler Inhale 1-2 puffs into the lungs every 6 (six) hours as needed for wheezing or shortness of breath.      Marland Kitchen apixaban (ELIQUIS) 5 MG TABS tablet Take 1 tablet (5 mg total) by mouth 2 (two) times daily.  60 tablet  5  . digoxin (LANOXIN) 0.125 MG tablet Take 0.125 mg by mouth daily.      Marland Kitchen diltiazem (CARDIZEM CD) 120 MG 24 hr capsule Take 120 mg by mouth daily.       Marland Kitchen donepezil (ARICEPT) 10 MG tablet Take 1 tablet (10 mg total) by mouth at bedtime.  30 tablet  5  . furosemide (LASIX) 20 MG tablet Take 20 mg by mouth daily.      . Glucosamine-Chondroit-Vit C-Mn (GLUCOSAMINE 1500 COMPLEX) CAPS Take 1 capsule by mouth daily.      Marland Kitchen levothyroxine (SYNTHROID, LEVOTHROID) 75 MCG tablet Take 1 tablet (75 mcg total) by mouth daily before breakfast.  30 tablet  5  . metoprolol succinate (TOPROL-XL) 25 MG 24 hr tablet Take 25 mg by mouth daily.      Marland Kitchen omeprazole (PRILOSEC OTC) 20 MG tablet Take 20 mg by mouth daily.      . vitamin B-12 (CYANOCOBALAMIN) 500 MCG tablet Take 500 mcg by mouth daily.       Scheduled: . apixaban  5 mg Oral BID  . digoxin  0.125 mg Oral Daily  . diltiazem  120 mg Oral Daily  . donepezil  10 mg Oral QHS  . furosemide  20 mg Oral Daily  . levofloxacin  750 mg Oral Daily  . levothyroxine  75 mcg Oral QAC breakfast  . metoprolol succinate  25 mg Oral Daily   Continuous:  RUE:[id number], albuterol, menthol-cetylpyridinium  Assessment/Plan: Fever and cough in a elderly male with cognitive dysfunction, with rhonchi on exam. The fever resolved since starting antibiotics, influenza studies are negative. Followup x-ray shows chronic changes but no definitive consolidation. Patient does have a history of CHF, rhonchi could be related to this however he does not appear to be volume overload. We will continue a course of antibiotics for probable community-acquired pneumonia. Patient appears to be at his baseline I would discuss with family plans for disposition to home Chronic atrial fibrillation rate controlled Dementia Depression Diarrhea this does not appear to be anything persistent, patient does not appear to be dehydrated, we have been unable to obtain stool for C. Difficile however as this is episodic I do not believe this is related to C. Difficile or other infectious diarrhea.   LOS: 2 days   Cherylann Hobday D 07/14/2013, 11:57  AM

## 2013-07-14 NOTE — Plan of Care (Signed)
Pt had an incontience epsiode of loose BM. Blood tinged. Pt cleaned up and will notify MD.

## 2013-07-15 MED ORDER — POTASSIUM CHLORIDE CRYS ER 20 MEQ PO TBCR
20.0000 meq | EXTENDED_RELEASE_TABLET | Freq: Two times a day (BID) | ORAL | Status: DC
  Administered 2013-07-15 – 2013-07-17 (×4): 20 meq via ORAL
  Filled 2013-07-15 (×5): qty 1

## 2013-07-15 NOTE — Progress Notes (Signed)
Physical Therapy Treatment Patient Details Name: Cameron [name] MRN: [id number] DOB: [birth year] Today's Date: 07/15/2013 Time: 4010-2725 PT Time Calculation (min): 23 min  PT Assessment / Plan / Recommendation  History of Present Illness 80 yo male comes in from home lives with wife after almost passing out while having an episode of diarrhea while sitting on the commode tonight.  His wife was nearby, so caught him as he was slipping to ground, no head trauma.  Pt states that he got dizzy.  Did not know he was running fever.  Pt has h/o dementia adv per ED staff but he appears cognitively intact.  He says he has been coughing.  No n/v.  Just one episode of diarrhea today.  Reports eating and drinking normally.  No cp.  No sob.  No abd pain.  No le edema or swelling.    PT Comments   Patient making progress with mobility and gait today.  Continues to have slight balance deficits, but improved over yesterday.  Encouraged use of RW - patient declined.    Follow Up Recommendations  Home health PT;Supervision/Assistance - 24 hour     Does the patient have the potential to tolerate intense rehabilitation     Barriers to Discharge        Equipment Recommendations  Rolling walker with 5" wheels (TBD)    Recommendations for Other Services    Frequency Min 4X/week   Progress towards PT Goals Progress towards PT goals: Progressing toward goals  Plan Current plan remains appropriate    Precautions / Restrictions Precautions Precautions: Fall Restrictions Weight Bearing Restrictions: No   Pertinent Vitals/Pain     Mobility  Transfers Overall transfer level: Needs assistance Equipment used: None Transfers: Sit to/from Stand Sit to Stand: Supervision General transfer comment: Supervision for safety.  More steady today with static standing. Ambulation/Gait Ambulation/Gait assistance: Min guard Ambulation Distance (Feet): 280 Feet (to BR and then in hallway) Assistive device: None Gait  Pattern/deviations: Step-through pattern;Decreased stride length;Staggering left;Staggering right Gait velocity: decreased General Gait Details: Improved stability today.  Continues to stagger slightly to both sides - able to self-correct.  Patient refused use of RW.      PT Goals (current goals can now be found in the care plan section)    Visit Information  Last PT Received On: 07/15/13 Assistance Needed: +1 History of Present Illness: 80 yo male comes in from home lives with wife after almost passing out while having an episode of diarrhea while sitting on the commode tonight.  His wife was nearby, so caught him as he was slipping to ground, no head trauma.  Pt states that he got dizzy.  Did not know he was running fever.  Pt has h/o dementia adv per ED staff but he appears cognitively intact.  He says he has been coughing.  No n/v.  Just one episode of diarrhea today.  Reports eating and drinking normally.  No cp.  No sob.  No abd pain.  No le edema or swelling.     Subjective Data  Subjective: I need my bath.  Patient reports feeling better today.   Cognition  Cognition Arousal/Alertness: Awake/alert Behavior During Therapy: WFL for tasks assessed/performed Overall Cognitive Status: Impaired/Different from baseline Area of Impairment: Safety/judgement Safety/Judgement: Decreased awareness of deficits;Decreased awareness of safety    Balance  Balance Standing balance support: No upper extremity supported Standing balance-Leahy Scale: Fair High level balance activites: Turns;Head turns;Sudden stops High Level Balance Comments: Slight decrease in  balance with high level balance activities  End of Session PT - End of Session Equipment Utilized During Treatment: Gait belt Activity Tolerance: Patient tolerated treatment well Patient left: in chair;with call bell/phone within reach Nurse Communication: Mobility status   GP     Despina Pole 07/15/2013, 11:49 AM Carita Pian. Sanjuana Kava, [place] Pager [phone]

## 2013-07-15 NOTE — Progress Notes (Signed)
Subjective: No new complaints. No diarrhea  Objective: Vital signs in last 24 hours: Temp:  [97.9 F (36.6 C)-98.8 F (37.1 C)] 98.6 F (37 C) (01/17 0630) Pulse Rate:  [81-98] 85 (01/17 0630) Resp:  [18-20] 18 (01/17 0630) BP: (83-116)/(42-72) 98/51 mmHg (01/17 0630) SpO2:  [95 %-98 %] 95 % (01/17 0630) Weight change:  Last BM Date: 07/14/13  Intake/Output from previous day: 01/16 0701 - 01/17 0700 In: -  Out: 400 [Urine:400] Intake/Output this shift: Total I/O In: 360 [P.O.:360] Out: -   General appearance: alert and cooperative Resp: rhonchi bilaterally Cardio: regular rate and rhythm, S1, S2 normal, no murmur, click, rub or gallop Extremities: extremities normal, atraumatic, no cyanosis or edema  Lab Results:  Recent Labs  07/13/13 0125 07/14/13 0507  WBC 11.0* 8.8  HGB 11.6* 12.6*  HCT 34.3* 37.1*  PLT 129* 136*   BMET  Recent Labs  07/13/13 0125 07/14/13 0507  NA 142 140  K 3.4* 3.6*  CL 107 104  CO2 24 23  GLUCOSE 115* 86  BUN 17 16  CREATININE 0.98 0.95  CALCIUM 8.5 8.7    Studies/Results: Dg Chest 2 View  07/13/2013   CLINICAL DATA:  Shortness of breath.  EXAM: CHEST  2 VIEW  COMPARISON:  07/12/2013.  FINDINGS: Mediastinum and hilar structures normal. Cardiomegaly. Pulmonary vascularity is normal. Biapical pleural thickening is noted consistent with scarring. Interstitial changes consistent fibrosis noted. Chest is stable from prior exam. No acute bony abnormality identified.  IMPRESSION: Stable changes of interstitial fibrosis and pleural parenchymal scarring.   Electronically Signed   By: Marcello Moores  Register   On: 07/13/2013 14:10    Medications: I have reviewed the patient's current medications.  Assessment/Plan:   Fever and cough in a elderly male with cognitive dysfunction, with rhonchi on exam. The fever resolved since starting antibiotics, influenza studies are negative. Followup x-ray shows chronic changes but no definitive consolidation.  Patient does have a history of CHF, rhonchi could be related to this however he does not appear to be volume overload. We will continue a course of antibiotics for probable community-acquired pneumonia. Chronic atrial fibrillation rate controlled  Dementia  Depression  Diarrhea this does not appear to be anything persistent, patient does not appear to be dehydrated, we have been unable to obtain stool for C. Difficile however as this is episodic I do not believe this is related to C. Difficile or other infectious diarrhea Disposition  Home with HHPT vs [facility] depending on patients mobility today and family wishes (wife is sole caregiver).   LOS: 3 days   Tanesia Butner JOSEPH 07/15/2013, 10:22 AM

## 2013-07-15 NOTE — Progress Notes (Addendum)
Dr. Laurann Montana -  Patient currently taking Eliquis, but Xarelto is the preferred agent from the [place].  Currently his estimated CrCl is ~ 51 ml/min which would mean he needs 20 mg of Xarelto daily.  If CrCl decreases to < 50, he would need 15 mg of Xarelto daily.  To switch, I would give morning dose of Eliquis, and then take Xarelto with dinner that evening.  However, I believe he likely has a supply of Eliquis left at home, I would recommend letting him use the rest of supply before switching, and it would give him time to obtain the new Xarelto prescription from the [place].  Patient's daughter (POA at [facility]) Cameron [name] says she needs a letter about the patient's indication for Xarelto - please call her at [phone] if you're willing to switch.    Thanks!  Please contact if any questions.  Uvaldo Rising, BCPS  Clinical Pharmacist Pager [phone] Phone [phone]  07/15/2013 3:14 PM

## 2013-07-16 LAB — BASIC METABOLIC PANEL
BUN: 18 mg/dL (ref 6–23)
CALCIUM: 8.5 mg/dL (ref 8.4–10.5)
CHLORIDE: 103 meq/L (ref 96–112)
CO2: 24 mEq/L (ref 19–32)
CREATININE: 1.01 mg/dL (ref 0.50–1.35)
GFR calc Af Amer: 76 mL/min — ABNORMAL LOW (ref >90)
GFR calc non Af Amer: 66 mL/min — ABNORMAL LOW (ref >90)
Glucose, Bld: 104 mg/dL — ABNORMAL HIGH (ref 70–99)
Potassium: 4 mEq/L (ref 3.7–5.3)
Sodium: 139 mEq/L (ref 137–147)

## 2013-07-16 LAB — CLOSTRIDIUM DIFFICILE BY PCR: CDIFFPCR: NEGATIVE

## 2013-07-16 NOTE — Progress Notes (Signed)
Utilization review completed.  

## 2013-07-16 NOTE — Progress Notes (Signed)
Spoke to Education officer, museum, she will try to see patient and patient's family if possible today, if not she may try to call family or followup tomorrow.

## 2013-07-16 NOTE — Discharge Instructions (Signed)
Information on my medicine - ELIQUIS® (apixaban) ° °This medication education was reviewed with me or my healthcare representative as part of my discharge preparation.  The pharmacist that spoke with me during my hospital stay was:  Jyll Tomaro C, RPH ° °Why was Eliquis® prescribed for you? °Eliquis® was prescribed for you to reduce the risk of forming blood clots that can cause a stroke if you have a medical condition called atrial fibrillation (a type of irregular heartbeat) OR to reduce the risk of a blood clots forming after orthopedic surgery. ° °What do You need to know about Eliquis® ? °Take your Eliquis® TWICE DAILY - one tablet in the morning and one tablet in the evening with or without food.  It would be best to take the doses about the same time each day. ° °If you have difficulty swallowing the tablet whole please discuss with your pharmacist how to take the medication safely. ° °Take Eliquis® exactly as prescribed by your doctor and DO NOT stop taking Eliquis® without talking to the doctor who prescribed the medication.  Stopping may increase your risk of developing a new clot or stroke.  Refill your prescription before you run out. ° °After discharge, you should have regular check-up appointments with your healthcare provider that is prescribing your Eliquis®.  In the future your dose may need to be changed if your kidney function or weight changes by a significant amount or as you get older. ° °What do you do if you miss a dose? °If you miss a dose, take it as soon as you remember on the same day and resume taking twice daily.  Do not take more than one dose of ELIQUIS at the same time. ° °Important Safety Information °A possible side effect of Eliquis® is bleeding. You should call your healthcare provider right away if you experience any of the following: °? Bleeding from an injury or your nose that does not stop. °? Unusual colored urine (red or dark brown) or unusual colored stools (red or  black). °? Unusual bruising for unknown reasons. °? A serious fall or if you hit your head (even if there is no bleeding). ° °Some medicines may interact with Eliquis® and might increase your risk of bleeding or clotting while on Eliquis®. To help avoid this, consult your healthcare provider or pharmacist prior to using any new prescription or non-prescription medications, including herbals, vitamins, non-steroidal anti-inflammatory drugs (NSAIDs) and supplements. ° °This website has more information on Eliquis® (apixaban): www.Eliquis.com. ° ° °

## 2013-07-16 NOTE — Progress Notes (Signed)
Subjective: Feeling better  Objective: Vital signs in last 24 hours: Temp:  [97.2 F (36.2 C)-99.1 F (37.3 C)] 99.1 F (37.3 C) (01/18 0514) Pulse Rate:  [84-91] 91 (01/18 0514) Resp:  [20] 20 (01/18 0514) BP: (97-110)/(49-65) 97/49 mmHg (01/18 0514) SpO2:  [99 %-100 %] 99 % (01/18 0514) Weight change:  Last BM Date: 07/14/13  Intake/Output from previous day: 01/17 0701 - 01/18 0700 In: 600 [P.O.:600] Out: 350 [Urine:350] Intake/Output this shift: Total I/O In: 360 [P.O.:360] Out: -   General appearance: alert and cooperative Resp: rhonchi bilaterally Cardio: irregularly irregular rhythm Extremities: extremities normal, atraumatic, no cyanosis or edema  Lab Results:  Recent Labs  07/14/13 0507  WBC 8.8  HGB 12.6*  HCT 37.1*  PLT 136*   BMET  Recent Labs  07/14/13 0507 07/16/13 0600  NA 140 139  K 3.6* 4.0  CL 104 103  CO2 23 24  GLUCOSE 86 104*  BUN 16 18  CREATININE 0.95 1.01  CALCIUM 8.7 8.5    Studies/Results: No results found.  Medications: I have reviewed the patient's current medications.  Assessment/Plan: Fever and cough in a elderly male with cognitive dysfunction, with rhonchi on exam. The fever resolved since starting antibiotics, influenza studies are negative. Followup x-ray shows chronic changes but no definitive consolidation. Continue antibiotics Chronic atrial fibrillation rate controlled  Dementia  Depression  Diarrhea resolved Disposition  Patient wants to go home but wife has serious reservations about her ability to care for him in his current state. Spoke with daughter in law today (Dr. Radford Pax) who feels [facility] for rehab more approprate.  Will arrange SW consult.    LOS: 4 days   Cameron [name] JOSEPH 07/16/2013, 10:18 AM

## 2013-07-17 DIAGNOSIS — J189 Pneumonia, unspecified organism: Secondary | ICD-10-CM

## 2013-07-17 MED ORDER — POTASSIUM CHLORIDE CRYS ER 20 MEQ PO TBCR: 10.0000 meq | EXTENDED_RELEASE_TABLET | Freq: Once | ORAL | Status: DC

## 2013-07-17 MED ORDER — LEVOFLOXACIN 750 MG PO TABS: 750.0000 mg | ORAL_TABLET | Freq: Every day | ORAL | Status: DC

## 2013-07-17 NOTE — Discharge Summary (Signed)
Physician Discharge Summary  Patient ID: Cameron [name] MRN: [id number] DOB/AGE: 1925/11/24 80 y.o.  Admit date: 07/12/2013 Discharge date: 07/17/2013  Admission Diagnoses:  Discharge Diagnoses:  Principal Problem:   Fever Active Problems:   PULMONARY FIBROSIS, POSTINFLAMMATORY   NICM (nonischemic cardiomyopathy) EF 38%   Chronic systolic CHF (congestive heart failure)   Dementia   Weakness generalized   Diarrhea   CAP (community acquired pneumonia)   Discharged Condition: stable  Hospital Course:  Patient presented to the hospital with malaise, he was febrile, initial labs had leukocytosis. Family states patient was coughing at home. Patient was given gentle IV fluids, started empirically on antibiotics for pneumonia, Tamiflu. Cultures for influenza were negative. Patient remained afebrile, leukocytosis resolved the following day. Patient did have rhonchi on exam. Followup x-ray was ordered however there was no definitive consolidation he did have some chronic changes of pulmonary fibrosis. Patient will continue a course of antibiotics for community-acquired pneumonia, he will require 2 more days of antibiotics. Patient has dementia and has had failure to thrive at home. They also was some concern for diarrhea at home. Patient had a few loose stools in the hospital, C. Difficile was negative. Patient appears to be back to his baseline, tolerating a normal diet. At this time patient is felt stable for discharge to home however due to his dementia and his knees he will require skilled nursing facility.  Consults:    Significant Diagnostic Studies:Dg Chest 2 View  07/13/2013   CLINICAL DATA:  Shortness of breath.  EXAM: CHEST  2 VIEW  COMPARISON:  07/12/2013.  FINDINGS: Mediastinum and hilar structures normal. Cardiomegaly. Pulmonary vascularity is normal. Biapical pleural thickening is noted consistent with scarring. Interstitial changes consistent fibrosis noted. Chest is stable from  prior exam. No acute bony abnormality identified.  IMPRESSION: Stable changes of interstitial fibrosis and pleural parenchymal scarring.   Electronically Signed   By: [place]   On: 07/13/2013 14:10   Dg Chest Portable 1 View  07/12/2013   CLINICAL DATA:  Generalized weakness. Lethargy. Cough. Current history of bronchiectasis.  EXAM: PORTABLE CHEST - 1 VIEW  COMPARISON:  Portable examination 03/28/2013, 03/26/2013. Two-view chest x-ray 10/05/2012.  FINDINGS: Cardiac silhouette mildly to moderately enlarged but stable, allowing for differences in technique. Biapical pleuroparenchymal scarring, unchanged. Chronic diffuse interstitial lung disease with basilar predominance, unchanged. No new pulmonary parenchymal abnormalities. Pleural scarring involving the costophrenic angles bilaterally.  IMPRESSION: 1. No acute cardiopulmonary disease. Stable chronic interstitial lung disease with a basilar predominance, likely interstitial fibrosis. 2. Stable cardiomegaly without pulmonary edema.   Electronically Signed   By: Evangeline Dakin M.D.   On: 07/12/2013 22:40      Discharge Exam: Blood pressure 91/51, pulse 91, temperature 98.2 F (36.8 C), temperature source Oral, resp. rate 20, height 5\' 10"  (1.778 m), weight 64.3 kg (141 lb 12.1 oz), SpO2 95.00%. General appearance: alert Resp: bibasilar rhonchi Cardio: irregularly irregular rhythm Extremities: extremities normal, atraumatic, no cyanosis or edema  Disposition: skilled nursing facility   Future Appointments Provider Department Dept Phone   07/19/2013 11:30 AM Sinclair Grooms, MD [facility] [phone]       Medication List         albuterol 108 (90 BASE) MCG/ACT inhaler  Commonly known as:  PROVENTIL HFA;VENTOLIN HFA  Inhale 1-2 puffs into the lungs every 6 (six) hours as needed for wheezing or shortness of breath.     apixaban 5 MG Tabs tablet  Commonly known  asArne Cleveland  Take 1 tablet (5 mg total) by  mouth 2 (two) times daily.     digoxin 0.125 MG tablet  Commonly known as:  LANOXIN  Take 0.125 mg by mouth daily.     diltiazem 120 MG 24 hr capsule  Commonly known as:  CARDIZEM CD  Take 120 mg by mouth daily.     donepezil 10 MG tablet  Commonly known as:  ARICEPT  Take 1 tablet (10 mg total) by mouth at bedtime.     furosemide 20 MG tablet  Commonly known as:  LASIX  Take 20 mg by mouth daily.     GLUCOSAMINE 1500 COMPLEX Caps  Take 1 capsule by mouth daily.     levofloxacin 750 MG tablet  Commonly known as:  LEVAQUIN  Take 1 tablet (750 mg total) by mouth daily.     levothyroxine 75 MCG tablet  Commonly known as:  SYNTHROID, LEVOTHROID  Take 1 tablet (75 mcg total) by mouth daily before breakfast.     metoprolol succinate 25 MG 24 hr tablet  Commonly known as:  TOPROL-XL  Take 25 mg by mouth daily.     omeprazole 20 MG tablet  Commonly known as:  PRILOSEC OTC  Take 20 mg by mouth daily.     potassium chloride SA 20 MEQ tablet  Commonly known as:  K-DUR,KLOR-CON  Take 0.5 tablets (10 mEq total) by mouth once.     vitamin B-12 500 MCG tablet  Commonly known as:  CYANOCOBALAMIN  Take 500 mcg by mouth daily.           Follow-up Information   Follow up with Dedee Liss D, MD In 4 weeks.   Specialty:  Internal Medicine   Contact information:   [address] [place] [address] [phone]       Signed: Kandice Hams 07/17/2013, 3:05 PM

## 2013-07-17 NOTE — Progress Notes (Signed)
Physical Therapy Treatment Patient Details Name: MADHAV MOHON MRN: [id number] DOB: 07/26/1925 Today's Date: 07/17/2013 Time: 7371-0626 PT Time Calculation (min): 43 min  PT Assessment / Plan / Recommendation  History of Present Illness 80 yo male comes in from home lives with wife after almost passing out while having an episode of diarrhea while sitting on the commode tonight.  His wife was nearby, so caught him as he was slipping to ground, no head trauma.  Pt states that he got dizzy.  Did not know he was running fever.  Pt has h/o dementia adv per ED staff but he appears cognitively intact.  He says he has been coughing.  No n/v.  Just one episode of diarrhea today.  Reports eating and drinking normally.  No cp.  No sob.  No abd pain.  No le edema or swelling.    PT Comments   Patient continues to demonstrate functional and cognitive deficits. Patient continues to remain unsteady with ambulation and has difficulty with sequencing and attention.  Patient also demonstrates poor awareness of hygiene and self care. Upon speaking with nsg, family is unable to provide adequate level of assist/care at this time.  Patient will need ST SNF upon discharge despite risk for increased cognitive decline given baseline dementia.   Follow Up Recommendations  SNF           Equipment Recommendations  Rolling walker with 5" wheels       Frequency Min 4X/week   Progress towards PT Goals Progress towards PT goals: Progressing toward goals (modestly)  Plan Current plan remains appropriate    Precautions / Restrictions Precautions Precautions: Fall Precaution Comments: enteric pracuations Restrictions Weight Bearing Restrictions: No   Pertinent Vitals/Pain No pain at this time    Mobility  Bed Mobility Overal bed mobility: Needs Assistance Bed Mobility: Sit to Supine Sit to supine: Min assist General bed mobility comments: patient required increased assist to come to EOB today, decreased  processing of commands and assist to pulling to elevated positions. Patient could not follow sequencing instructions provided. Transfers Overall transfer level: Needs assistance Equipment used: None Sit to Stand: Min assist General transfer comment: min assist to stand today, first attempt to stand on his own patient fell backwards onto the bed, upon second attempt patient was able to stand with minimal assist for stability. Later in session patient was able to stand from chair with supervision/min guard) x2  Ambulation/Gait Ambulation/Gait assistance: Min guard;Min assist Ambulation Distance (Feet): 240 Feet Assistive device: None Gait Pattern/deviations: Step-through pattern;Decreased stride length;Staggering left;Staggering right Gait velocity: decreased Gait velocity interpretation: <1.8 ft/sec, indicative of risk for recurrent falls General Gait Details: continues to demonstrate instability with ambulation and poor attention to task, multiple cues to redirect patient to ambulation.      PT Goals (current goals can now be found in the care plan section) Acute Rehab PT Goals Patient Stated Goal: to go home PT Goal Formulation: With patient Time For Goal Achievement: 07/27/13 Potential to Achieve Goals: Good  Visit Information  Last PT Received On: 07/17/13 Assistance Needed: +1 History of Present Illness: 80 yo male comes in from home lives with wife after almost passing out while having an episode of diarrhea while sitting on the commode tonight.  His wife was nearby, so caught him as he was slipping to ground, no head trauma.  Pt states that he got dizzy.  Did not know he was running fever.  Pt has h/o dementia adv per ED  staff but he appears cognitively intact.  He says he has been coughing.  No n/v.  Just one episode of diarrhea today.  Reports eating and drinking normally.  No cp.  No sob.  No abd pain.  No le edema or swelling.     Subjective Data  Subjective: I am wet Patient  Stated Goal: to go home   Cognition  Cognition Arousal/Alertness: Awake/alert Behavior During Therapy: WFL for tasks assessed/performed Overall Cognitive Status: Impaired/Different from baseline Area of Impairment: Safety/judgement Safety/Judgement: Decreased awareness of deficits;Decreased awareness of safety Awareness: Emergent Problem Solving: Slow processing;Difficulty sequencing;Requires verbal cues;Requires tactile cues General Comments: patient could not sequence how to get out of the bed, significant time needed with max cues and min assist to come to EOB, patient once again was not concerned about the fact that he had been lying in urine. Patient required max verbal cues to direct to task.    Balance  Balance Overall balance assessment: Needs assistance Standing balance support: No upper extremity supported Standing balance-Leahy Scale: Fair High level balance activites: Turns;Head turns;Sudden stops High Level Balance Comments: decreased balance with mobility General Comments General comments (skin integrity, edema, etc.): Patient received incontinent of urine with strong foul odor. patient assisted with hygiene and bathing.  Patient able to perform self self care.   End of Session PT - End of Session Equipment Utilized During Treatment: Gait belt Activity Tolerance: Patient tolerated treatment well Patient left: in chair;with call bell/phone within reach;with chair alarm set Nurse Communication: Mobility status, Overnight nsg had signed patient off for mobility checklist, at this time, this PT highly recommending that patient not be signed off as patient presents significant fall risk in conjunction with cognition deficits. Nsg [facility]) in agreement. Nsg techs aware.   GP     Duncan Dull 07/17/2013, 10:01 AM Alben Deeds, PT DPT  [phone]

## 2013-07-17 NOTE — Progress Notes (Signed)
Clinical Social Work Department CLINICAL SOCIAL WORK PLACEMENT NOTE 07/17/2013  Patient:  KANNAN, PROIA  Account Number:  [id number] Admit date:  07/12/2013  Clinical Social Worker:  Blima Rich, Latanya Presser  Date/time:  07/17/2013 05:28 PM  Clinical Social Work is seeking post-discharge placement for this patient at the following level of care:   [place]   (*CSW will update this form in Epic as items are completed)   07/17/2013  Patient/family provided with [place] Department of Clinical Social Work's list of facilities offering this level of care within the geographic area requested by the patient (or if unable, by the patient's family).  07/17/2013  Patient/family informed of their freedom to choose among providers that offer the needed level of care, that participate in Medicare, Medicaid or managed care program needed by the patient, have an available bed and are willing to accept the patient.  07/17/2013  Patient/family informed of [facility]' ownership interest in [facility], as well as of the fact that they are under no obligation to receive care at this facility.  PASARR submitted to EDS on 07/17/2013 PASARR number received from EDS on 07/17/2013  FL2 transmitted to all facilities in geographic area requested by pt/family on  07/17/2013 FL2 transmitted to all facilities within larger geographic area on   Patient informed that his/her managed care company has contracts with or will negotiate with  certain facilities, including the following:     Patient/family informed of bed offers received:   Patient chooses bed at [place] Physician recommends and patient chooses bed at    Patient to be transferred to [place] on  07/17/2013 Patient to be transferred to facility by Wife in private car.  The following physician request were entered in Epic:   Additional Comments: Patient is medically stable for  D/C today to [facility]. Clinical Education officer, museum (CSW) prepared D/C packet. RN is aware of above. Please reconsult if further social work needs arise. CSW signing off.

## 2013-07-17 NOTE — Progress Notes (Signed)
Clinical Social Work Department BRIEF PSYCHOSOCIAL ASSESSMENT 07/17/2013  Patient:  Cameron [name], Cameron [name]     Account Number:  [id number]     Admit date:  07/12/2013  Clinical Social Worker:  Rolinda Roan  Date/Time:  07/17/2013 05:17 PM  Referred by:  Physician  Date Referred:  07/16/2013 Referred for  SNF Placement   Other Referral:   Interview type:  Patient Other interview type:    PSYCHOSOCIAL DATA Living Status:  WIFE Admitted from facility:   Level of care:   Primary support name:  Flossy Hintz Primary support relationship to patient:  SPOUSE Degree of support available:   Very supportive per patient.    CURRENT CONCERNS  Other Concerns:    SOCIAL WORK ASSESSMENT / PLAN Clinical Social Worker (CSW) met with patient to discuss D/C plan. Patient is agreeable to SNF search in [facility]. Patient told CSW to contact his wife. CSW contacted patient's wife and provided her with available bed offers in [facility]. Wife chose bed at [facility].   Assessment/plan status:  Psychosocial Support/Ongoing Assessment of Needs Other assessment/ plan:   Information/referral to community resources:    PATIENT'S/FAMILY'S RESPONSE TO PLAN OF CARE: Patient and family thanked CSW for help in th placement process.

## 2013-07-17 NOTE — Progress Notes (Signed)
Subjective: No new problems  Objective: Vital signs in last 24 hours: Temp:  [98.4 F (36.9 C)-100.4 F (38 C)] 98.4 F (36.9 C) (01/19 1041) Pulse Rate:  [77-99] 93 (01/19 1041) Resp:  [18-20] 20 (01/19 1041) BP: (103-128)/(53-67) 103/53 mmHg (01/19 1041) SpO2:  [95 %-100 %] 97 % (01/19 1041) Weight change:  Last BM Date: 07/14/13  Intake/Output from previous day: 01/18 0701 - 01/19 0700 In: 360 [P.O.:360] Out: -  Intake/Output this shift: Total I/O In: 120 [P.O.:120] Out: -   General appearance: alert Resp: bibasilar rhonchi Cardio: irregularly irregular rhythm Extremities: extremities normal, atraumatic, no cyanosis or edema  Lab Results:  Results for orders placed during the hospital encounter of 07/12/13 (from the past 24 hour(s))  CLOSTRIDIUM DIFFICILE BY PCR     Status: None   Collection Time    07/16/13 12:10 PM      Result Value Range   C difficile by pcr NEGATIVE  NEGATIVE      Studies/Results: No results found.  Medications:  Prior to Admission:  Prescriptions prior to admission  Medication Sig Dispense Refill  . albuterol (PROVENTIL HFA;VENTOLIN HFA) 108 (90 BASE) MCG/ACT inhaler Inhale 1-2 puffs into the lungs every 6 (six) hours as needed for wheezing or shortness of breath.      Marland Kitchen apixaban (ELIQUIS) 5 MG TABS tablet Take 1 tablet (5 mg total) by mouth 2 (two) times daily.  60 tablet  5  . digoxin (LANOXIN) 0.125 MG tablet Take 0.125 mg by mouth daily.      Marland Kitchen diltiazem (CARDIZEM CD) 120 MG 24 hr capsule Take 120 mg by mouth daily.      Marland Kitchen donepezil (ARICEPT) 10 MG tablet Take 1 tablet (10 mg total) by mouth at bedtime.  30 tablet  5  . furosemide (LASIX) 20 MG tablet Take 20 mg by mouth daily.      . Glucosamine-Chondroit-Vit C-Mn (GLUCOSAMINE 1500 COMPLEX) CAPS Take 1 capsule by mouth daily.      Marland Kitchen levothyroxine (SYNTHROID, LEVOTHROID) 75 MCG tablet Take 1 tablet (75 mcg total) by mouth daily before breakfast.  30 tablet  5  . metoprolol  succinate (TOPROL-XL) 25 MG 24 hr tablet Take 25 mg by mouth daily.      Marland Kitchen omeprazole (PRILOSEC OTC) 20 MG tablet Take 20 mg by mouth daily.      . vitamin B-12 (CYANOCOBALAMIN) 500 MCG tablet Take 500 mcg by mouth daily.       Scheduled: . apixaban  5 mg Oral BID  . digoxin  0.125 mg Oral Daily  . diltiazem  120 mg Oral Daily  . donepezil  10 mg Oral QHS  . furosemide  20 mg Oral Daily  . levofloxacin  750 mg Oral Daily  . levothyroxine  75 mcg Oral QAC breakfast  . metoprolol succinate  25 mg Oral Daily  . potassium chloride  20 mEq Oral BID   Continuous:  OEU:[id number], albuterol, menthol-cetylpyridinium  Assessment/Plan: Fever cough in an elderly male with cognitive dysfunction being  treated for community-acquired pneumonia. Remains afebrile. No productive cough. Chronic A. Fib rate controlled Dementia required skilled nursing facility Resolved diarrhea, fortunately culture was obtained and it was C. Difficile negative  Awaiting nursing home placement   LOS: 5 days   Sharnetta Gielow D 07/17/2013, 11:55 AM

## 2013-07-17 NOTE — Progress Notes (Signed)
Report given to [place] facility.

## 2013-07-19 ENCOUNTER — Encounter: Payer: Self-pay | Admitting: Interventional Cardiology

## 2013-07-19 ENCOUNTER — Ambulatory Visit (INDEPENDENT_AMBULATORY_CARE_PROVIDER_SITE_OTHER): Payer: Medicare Other | Admitting: Interventional Cardiology

## 2013-07-19 VITALS — BP 123/70 | HR 87 | Ht 70.0 in | Wt 143.0 lb

## 2013-07-19 DIAGNOSIS — J189 Pneumonia, unspecified organism: Secondary | ICD-10-CM

## 2013-07-19 DIAGNOSIS — I4891 Unspecified atrial fibrillation: Secondary | ICD-10-CM

## 2013-07-19 DIAGNOSIS — I4819 Other persistent atrial fibrillation: Secondary | ICD-10-CM

## 2013-07-19 DIAGNOSIS — I5032 Chronic diastolic (congestive) heart failure: Secondary | ICD-10-CM

## 2013-07-19 DIAGNOSIS — F0390 Unspecified dementia without behavioral disturbance: Secondary | ICD-10-CM

## 2013-07-19 LAB — CULTURE, BLOOD (ROUTINE X 2)
CULTURE: NO GROWTH
CULTURE: NO GROWTH
Culture: NO GROWTH
Culture: NO GROWTH

## 2013-07-19 NOTE — Patient Instructions (Signed)
Your physician recommends that you continue on your current medications as directed. Please refer to the Current Medication list given to you today.  Your physician recommends that you schedule a follow-up appointment in: 1 month with Dr.Varanasi  Keep your upcoming appt with Dr.Polite

## 2013-07-19 NOTE — Progress Notes (Signed)
Patient ID: CASH DUCE, male   DOB: December 26, 1925, 80 y.o.   MRN: [id number]    [address], [address] [place], [place]  [address] Phone: [phone] Fax:  [phone]  Date:  07/19/2013   ID:  SKYY MCKNIGHT, DOB 1926-04-17, MRN [id number]  PCP:  Kandice Hams, MD   ASSESSMENT:  1. Persistent atrial fibrillation with moderate rate control 2. Chronic diastolic heart failure, compensated. Previous systolic dysfunction improved with control of ventricular rate 3. Recent febrile illness, improved 4. COPD  PLAN:  1. Appointment with Dr. Irish Lack in 3-4 weeks for followup of atrial fibrillation and diastolic heart failure 2. No change in medical regimen 3. Continue physical therapy   SUBJECTIVE: MICHEL HENDON is a 80 y.o. male who is in for followup after a recent hospital stay for febrile illness. No obvious cardiac complaints at this time. Mr. and Mrs. Cafiero do not know why they're here to see me. He ordinarily sees Dr. Clayton Bibles. I think he was scheduled to see me because Dr. Clayton Bibles. was not available. There is no obvious evidence of heart failure although recent hospital stay. His medications were not adjusted. He denies dyspnea. He is at a rehabilitation center trying to gain strength. His appetite is still not the best. He has gained 2 pounds since he was hospitalized. He denies syncope. No prolonged palpitations. No medication side effects.   Wt Readings from Last 3 Encounters:  07/19/13 143 lb (64.864 kg)  07/13/13 141 lb 12.1 oz (64.3 kg)  03/31/13 155 lb 10.3 oz (70.6 kg)     Past Medical History  Diagnosis Date  . GERD (gastroesophageal reflux disease)   . CHF (congestive heart failure)   . Dysrhythmia     atrial fibrilation  . COPD (chronic obstructive pulmonary disease)   . Arthritis     Current Outpatient Prescriptions  Medication Sig Dispense Refill  . albuterol (PROVENTIL HFA;VENTOLIN HFA) 108 (90 BASE) MCG/ACT inhaler Inhale 1-2 puffs into the lungs every 6  (six) hours as needed for wheezing or shortness of breath.      Marland Kitchen apixaban (ELIQUIS) 5 MG TABS tablet Take 1 tablet (5 mg total) by mouth 2 (two) times daily.  60 tablet  5  . digoxin (LANOXIN) 0.125 MG tablet Take 0.125 mg by mouth daily.      Marland Kitchen diltiazem (CARDIZEM CD) 120 MG 24 hr capsule Take 120 mg by mouth daily.      Marland Kitchen donepezil (ARICEPT) 10 MG tablet Take 1 tablet (10 mg total) by mouth at bedtime.  30 tablet  5  . furosemide (LASIX) 20 MG tablet Take 20 mg by mouth daily.      . Glucosamine-Chondroit-Vit C-Mn (GLUCOSAMINE 1500 COMPLEX) CAPS Take 1 capsule by mouth daily.      Marland Kitchen levothyroxine (SYNTHROID, LEVOTHROID) 75 MCG tablet Take 1 tablet (75 mcg total) by mouth daily before breakfast.  30 tablet  5  . metoprolol succinate (TOPROL-XL) 25 MG 24 hr tablet Take 25 mg by mouth daily.      Marland Kitchen omeprazole (PRILOSEC OTC) 20 MG tablet Take 20 mg by mouth daily.      . potassium chloride SA (K-DUR,KLOR-CON) 20 MEQ tablet Take 0.5 tablets (10 mEq total) by mouth once.      . sertraline (ZOLOFT) 25 MG tablet Take 25 mg by mouth daily.      . vitamin B-12 (CYANOCOBALAMIN) 500 MCG tablet Take 500 mcg by mouth daily.  No current facility-administered medications for this visit.    Allergies:   No Known Allergies  Social History:  The patient  reports that he quit smoking about 52 years ago. His smoking use included Cigarettes. He has a 80 pack-year smoking history. He has never used smokeless tobacco. He reports that he drinks about 1.2 ounces of alcohol per week. He reports that he does not use illicit drugs.   ROS:  Please see the history of present illness.   Decreased memory. No cardiac complaints. Poor appetite.   All other systems reviewed and negative.   OBJECTIVE: VS:  BP 123/70  Pulse 87  Ht 5\' 10"  (1.778 m)  Wt 143 lb (64.864 kg)  BMI 20.52 kg/m2 Well nourished, well developed, in no acute distress, frail and appears older than stated age 80: normal Neck: JVD flat.  Carotid bruit absent  Cardiac:  normal S1, S2; IIRR; no murmur Lungs:  clear to auscultation bilaterally, no wheezing, rhonchi or rales Abd: soft, nontender, no hepatomegaly Ext: Edema absent. Pulses 2+ Skin: warm and dry Neuro:  CNs 2-12 intact, no focal abnormalities noted  EKG:  Not repeated       Signed, Illene Labrador III, MD 07/19/2013 12:53 PM

## 2013-07-21 ENCOUNTER — Non-Acute Institutional Stay (SKILLED_NURSING_FACILITY): Payer: Medicare Other | Admitting: Internal Medicine

## 2013-07-21 DIAGNOSIS — I420 Dilated cardiomyopathy: Secondary | ICD-10-CM

## 2013-07-21 DIAGNOSIS — I428 Other cardiomyopathies: Secondary | ICD-10-CM

## 2013-07-21 DIAGNOSIS — F0280 Dementia in other diseases classified elsewhere without behavioral disturbance: Secondary | ICD-10-CM

## 2013-07-21 DIAGNOSIS — G309 Alzheimer's disease, unspecified: Secondary | ICD-10-CM

## 2013-07-21 DIAGNOSIS — J189 Pneumonia, unspecified organism: Secondary | ICD-10-CM

## 2013-07-21 NOTE — Progress Notes (Signed)
Patient ID: Cameron [name], male   DOB: 1925-08-29, 80 y.o.   MRN: [id number]  Facility; [facility] Chief complaint; admission to SNF post admit to Cameron [name] from 1/14 to 1/19  History; this is a 80 year old man who was admitted to [name] febrile. He had been coughing at home he was slightly dehydrated he was started on antibiotics and Tamiflu. Swabs for influenza were negative for. Followup chest x-ray showed no definite consolidation although he was told to have chronic changes of pulmonary fibrosis. The patient states he has COPD. He was given a course of antibiotics for community-acquired pneumonia. He was felt to have dementia and also failure to thrive at home. The patient had some loose stools in the [name] C. difficile was negative for. He was sent here due to the concerns about dementia failure to thrive and his "knees"  Results for Cameron [name], Cameron [name] (MRN [id number]) as of 07/21/2013 10:14  Ref. Range 07/13/2013 12:06 07/13/2013 13:20 07/14/2013 05:07 07/16/2013 06:00 07/16/2013 12:10  Sodium Latest Range: 137-147 mEq/L   140 139   Potassium Latest Range: 3.7-5.3 mEq/L   3.6 (L) 4.0   Chloride Latest Range: 96-112 mEq/L   104 103   CO2 Latest Range: 19-32 mEq/L   23 24   BUN Latest Range: 6-23 mg/dL   16 18   Creatinine Latest Range: 0.50-1.35 mg/dL   0.95 1.01   Calcium Latest Range: 8.4-10.5 mg/dL   8.7 8.5   GFR calc non Af Amer Latest Range: >90 mL/min   74 (L) 66 (L)   GFR calc Af Amer Latest Range: >90 mL/min   86 (L) 76 (L)   Glucose Latest Range: 70-99 mg/dL   86 104 (H)   WBC Latest Range: 4.0-10.5 K/uL   8.8    RBC Latest Range: 4.22-5.81 MIL/uL   3.60 (L)    Hemoglobin Latest Range: 13.0-17.0 g/dL   12.6 (L)    HCT Latest Range: 39.0-52.0 %   37.1 (L)    MCV Latest Range: 78.0-100.0 fL   103.1 (H)    MCH Latest Range: 26.0-34.0 pg   35.0 (H)     Latest Range: 30.0-36.0 g/dL   34.0     Latest Range: 11.5-15.5 %   13.2    Platelets Latest Range: 150-400 K/uL   136 (L)     C difficile by pcr Latest Range: NEGATIVE      NEGATIVE  CLOSTRIDIUM DIFFICILE BY PCR No range found     Rpt  DG CHEST 2 VIEW No range found  Rpt     EKG No range found Attch           Study Result    CLINICAL DATA:  Shortness of breath.   EXAM: CHEST  2 VIEW   COMPARISON:  07/12/2013.   FINDINGS: Mediastinum and hilar structures normal. Cardiomegaly. Pulmonary vascularity is normal. Biapical pleural thickening is noted consistent with scarring. Interstitial changes consistent fibrosis noted. Chest is stable from prior exam. No acute bony abnormality identified.   IMPRESSION: Stable changes of interstitial fibrosis and pleural parenchymal scarring.     Elctronically  Signed   By: Cameron [name]  Register   On: 07/13/2013 14:10   Past Medical History  Diagnosis Date  . GERD (gastroesophageal reflux disease)   . CHF (congestive heart failure)   . Dysrhythmia     atrial fibrilation  . COPD (chronic obstructive pulmonary disease)   . Arthritis    Past Surgical History  Procedure Laterality Date  .  Hernia repair    . Tonsillectomy    . Hemmroidectmy     Current Outpatient Prescriptions on File Prior to Visit  Medication Sig Dispense Refill  . albuterol (PROVENTIL HFA;VENTOLIN HFA) 108 (90 BASE) MCG/ACT inhaler Inhale 1-2 puffs into the lungs every 6 (six) hours as needed for wheezing or shortness of breath.      Marland Kitchen apixaban (ELIQUIS) 5 MG TABS tablet Take 1 tablet (5 mg total) by mouth 2 (two) times daily.  60 tablet  5  . digoxin (LANOXIN) 0.125 MG tablet Take 0.125 mg by mouth daily.      Marland Kitchen diltiazem (CARDIZEM CD) 120 MG 24 hr capsule Take 120 mg by mouth daily.      Marland Kitchen donepezil (ARICEPT) 10 MG tablet Take 1 tablet (10 mg total) by mouth at bedtime.  30 tablet  5  . furosemide (LASIX) 20 MG tablet Take 20 mg by mouth daily.      . Glucosamine-Chondroit-Vit C-Mn (GLUCOSAMINE 1500 COMPLEX) CAPS Take 1 capsule by mouth daily.      Marland Kitchen levothyroxine (SYNTHROID, LEVOTHROID) 75  MCG tablet Take 1 tablet (75 mcg total) by mouth daily before breakfast.  30 tablet  5  . metoprolol succinate (TOPROL-XL) 25 MG 24 hr tablet Take 25 mg by mouth daily.      Marland Kitchen omeprazole (PRILOSEC OTC) 20 MG tablet Take 20 mg by mouth daily.      . potassium chloride SA (K-DUR,KLOR-CON) 20 MEQ tablet Take 0.5 tablets (10 mEq total) by mouth once.      . sertraline (ZOLOFT) 25 MG tablet Take 25 mg by mouth daily.      . vitamin B-12 (CYANOCOBALAMIN) 500 MCG tablet Take 500 mcg by mouth daily.       No current facility-administered medications on file prior to visit.    Socially: She states he lives in an independent apartment with his wife. He claims to be independent. Not on oxygen not using an ambulatory assist device History   Social History  . Marital Status: Married    Spouse Name: N/A    Number of Children: N/A  . Years of Education: N/A   Occupational History  . Not on file.   Social History Main Topics  . Smoking status: Former Smoker -- 2.00 packs/day for 40 years    Types: Cigarettes    Quit date: 06/29/1961  . Smokeless tobacco: Never Used  . Alcohol Use: 1.2 oz/week    2 Glasses of wine per week     Comment: occasional  . Drug Use: No  . Sexual Activity: No   Other Topics Concern  . Not on file   Social History Narrative  . No narrative on file    Family History  Problem Relation Age of Onset  . Heart attack Mother   . Pulmonary embolism Father     Review of systems Respiratory patient does not complain of dyspnea or shortness of breath or cough although he does state he has COPD. Cardiac no complaints of chest pain GI no nausea vomiting or diarrhea Gate; he does not complain of balance problems or fall  Physical Gen. the patient does not look to be in any distress Vitals O2 sat is 92% on room air respiratory rate 24 pulse 75 Respiratory a few bibasilar crackles no wheezing there is no digital clubbing no accessory muscle use Cardiac heart sounds are  regular no murmur Abdomen no liver no spleen no masses Extremities venous stasis no edema Gait;  patient is able to bring himself to a standing position his gait is otherwise nondescript balance testing is normal Mental status; patient actually seems to do fairly well in general conversation. He has an abrasive side to his demeanor. I did not sense that his dementia was severe enough to automatically provide an explanation for failure to thrive. No overt delirium or depression  Impression/plan #1 community-acquired pneumonia and 10 in the setting of chronic pulmonary fibrosis. She states she has COPD although I do not see this listed #2 chronic systolic heart failure with a nonischemic cardiomyopathy and an EF of 30% there is no evidence of this at the bedside #3 diarrhea in the [name] we'll need to monitor this while he is here his C. difficile was negative #4 listed as having dementia and in another part of the discharge summary of failure to thrive presentation although I do not see that any of this is overtly severe. We will need a social work review of the living situation at home

## 2013-08-19 ENCOUNTER — Encounter: Payer: Self-pay | Admitting: Interventional Cardiology

## 2013-08-23 ENCOUNTER — Encounter: Payer: Self-pay | Admitting: Interventional Cardiology

## 2013-08-23 ENCOUNTER — Ambulatory Visit (INDEPENDENT_AMBULATORY_CARE_PROVIDER_SITE_OTHER): Payer: Medicare Other | Admitting: Interventional Cardiology

## 2013-08-23 VITALS — BP 107/61 | HR 84 | Ht 70.0 in | Wt 145.0 lb

## 2013-08-23 DIAGNOSIS — I4891 Unspecified atrial fibrillation: Secondary | ICD-10-CM

## 2013-08-23 DIAGNOSIS — I428 Other cardiomyopathies: Secondary | ICD-10-CM

## 2013-08-23 DIAGNOSIS — I5032 Chronic diastolic (congestive) heart failure: Secondary | ICD-10-CM

## 2013-08-23 MED ORDER — RIVAROXABAN 15 MG PO TABS: 15.0000 mg | ORAL_TABLET | Freq: Every day | ORAL | Status: AC

## 2013-08-23 NOTE — Progress Notes (Signed)
Patient ID: Cameron [name], male   DOB: [birth year], 80 y.o.   MRN: [id number]    [address], [place], [place]  [address] Phone: [phone] Fax:  [phone]  Date:  08/23/2013   ID:  Cameron [name], DOB 1925-08-13, MRN [id number]  PCP:  Kandice Hams, MD      History of Present Illness: Cameron [name] is a 80 y.o. male who has had a nonischemic cardiomyopathy diagnosed a few months ago. He was also diagnosed with AFib. His LV function improved in May 2014 echo. Atrial Fibrillation F/U:  c/o Leg edema improving, daily diuretic use.  Denies : Chest pain.  Dizziness.  Orthopnea.  Palpitations.  Shortness of breath.  Syncope.   walks outside several times a week for 30-40 minutes when the weather is warmer. Feels well with this.  Of late, he has not been exercising.    Wt Readings from Last 3 Encounters:  08/23/13 145 lb (65.772 kg)  07/19/13 143 lb (64.864 kg)  07/13/13 141 lb 12.1 oz (64.3 kg)     Past Medical History  Diagnosis Date  . GERD (gastroesophageal reflux disease)   . CHF (congestive heart failure)   . Dysrhythmia     atrial fibrilation  . COPD (chronic obstructive pulmonary disease)   . Arthritis     Current Outpatient Prescriptions  Medication Sig Dispense Refill  . albuterol (PROVENTIL HFA;VENTOLIN HFA) 108 (90 BASE) MCG/ACT inhaler Inhale 1-2 puffs into the lungs every 6 (six) hours as needed for wheezing or shortness of breath.      Marland Kitchen apixaban (ELIQUIS) 5 MG TABS tablet Take 1 tablet (5 mg total) by mouth 2 (two) times daily.  60 tablet  5  . digoxin (LANOXIN) 0.125 MG tablet Take 0.125 mg by mouth daily.      Marland Kitchen diltiazem (CARDIZEM CD) 120 MG 24 hr capsule Take 120 mg by mouth daily.      Marland Kitchen donepezil (ARICEPT) 10 MG tablet Take 1 tablet (10 mg total) by mouth at bedtime.  30 tablet  5  . furosemide (LASIX) 20 MG tablet Take 20 mg by mouth daily.      . Glucosamine-Chondroit-Vit C-Mn (GLUCOSAMINE 1500 COMPLEX) CAPS Take 1 capsule  by mouth daily.      Marland Kitchen levothyroxine (SYNTHROID, LEVOTHROID) 75 MCG tablet Take 1 tablet (75 mcg total) by mouth daily before breakfast.  30 tablet  5  . metoprolol succinate (TOPROL-XL) 25 MG 24 hr tablet Take 25 mg by mouth daily.      Marland Kitchen omeprazole (PRILOSEC OTC) 20 MG tablet Take 20 mg by mouth daily.      . potassium chloride SA (K-DUR,KLOR-CON) 20 MEQ tablet Take 0.5 tablets (10 mEq total) by mouth once.      . sertraline (ZOLOFT) 25 MG tablet Take 25 mg by mouth daily.      . vitamin B-12 (CYANOCOBALAMIN) 500 MCG tablet Take 500 mcg by mouth daily.       No current facility-administered medications for this visit.    Allergies:   No Known Allergies  Social History:  The patient  reports that he quit smoking about 52 years ago. His smoking use included Cigarettes. He has a 80 pack-year smoking history. He has never used smokeless tobacco. He reports that he drinks about 1.2 ounces of alcohol per week. He reports that he does not use illicit drugs.   Family History:  The patient's family history includes Heart attack in his mother;  Pulmonary embolism in his father.   ROS:  Please see the history of present illness.  No nausea, vomiting.  No fevers, chills.  No focal weakness.  No dysuria.    All other systems reviewed and negative.   PHYSICAL EXAM: VS:  BP 107/61  Pulse 84  Ht _0  (1.778 m)  Wt 145 lb (65.772 kg)  BMI 20.81 kg/m2 Well nourished, well developed, in no acute distress HEENT: normal Neck: no JVD, no carotid bruits Cardiac:  normal S1, S2; irregularly irregular Lungs:  clear to auscultation bilaterally, no wheezing, rhonchi or rales Abd: soft, nontender, no hepatomegaly Ext: no edema Skin: warm and dry Neuro:   no focal abnormalities noted       ASSESSMENT AND PLAN:  Atrial fibrillation  Stop Eliquis 5 mg Tablet, 1 tablet, orally, twice a day Wants to start Xarelto 15 mg daily, per the [place] MD, Dr. Regino [name] recommendation.   Notes: Rate controlled by Holter  monitor on 4/14. Avg HR was 86. HR 82 at todays visit. He had some bradycardia on the Holter as well. Would not increase rate control meds.  He has been doing well on Eliquis.  Given his risk of bleeding, in fact he's been doing well on the current medication, I recommended that he stay on the Eliquis.  However, for cost reasons, he wants to change. He is adamant that he wants to change this medication. He has discussed this with his physician at the [place].  One issue with the Eliquis has been that he sleeps a lot during the day. They're many days where he does not get both doses of the medicine. I think this does put him at higher risk of stroke. His wife feels that he would be more likely to take a once a day medicine properly. This seems reasonable. We did go over the bleeding risks associated with Xarelto.  We went over the fact that his creatinine clearance is right on the borderline of the 15 mg versus the 20 mg dose. He has had some falls and therefore, we decided on a 15 mg dose. He will let us know if he has any problems.   I gave him a note stating why the once a day anticoagulant would likely be more effective due to better compliance. I also asked him if he would like for me to talk to his son, Cameron [name],  about what we discussed today. He was fine with that.    2. Cardiomyopathy  Notes: Resolved by echo. May have been related to poor rate controlled.    3. Hypertension  Notes: Controlled.             Labs    Lab: Basic Metabolic  GLUCOSE 99  80-03 - mg/dL  BUN 30 H 6-26 - mg/dL  CREATININE 1.36 H 0.60-1.30 - mg/dl  eGFR (NON-AFRICAN AMERICAN) 50 L >60 - calc  eGFR (AFRICAN AMERICAN) 60 L >60 - calc  SODIUM 141  136-145 - mmol/L  POTASSIUM 4.2  3.5-5.5 - mmol/L  CHLORIDE 107  98-107 - mmol/L  C02 23  22-32 - mmol/L  ANION GAP 14.4  6.0-20.0 - mmol/L  CALCIUM 9.5  8.6-10.3 - mg/dL   Cameron [name],Cameron 03/09/2013 08:29:50 AM > stable. Drink at least 64 fluid oz. of water daily. [name],Cameron  03/09/2013 08:47:02 AM > Pt notified.        Lab: TSH  TSH 3.22  0.34-4.50 - ulU/mL   Terius Jacuinde,Cameron 03/09/2013 08:29:08 AM > normal TSH [name],Cameron  03/09/2013 08:46:50 AM > Pt notified.     Signed, Mina Marble, MD, [facility] 08/23/2013 11:25 AM

## 2013-08-23 NOTE — Patient Instructions (Addendum)
Your physician has recommended you make the following change in your medication:   1. Stop Eliquis.  2. Start Xarelto 15 mg 1 tablet by mouth daily with dinner. Written rx given to you today.  Your physician wants you to follow-up in: 6 months with Dr. Irish Lack. You will receive a reminder letter in the mail two months in advance. If you don't receive a letter, please call our office to schedule the follow-up appointment.

## 2013-08-24 ENCOUNTER — Other Ambulatory Visit (HOSPITAL_COMMUNITY): Payer: Self-pay | Admitting: Internal Medicine

## 2013-09-04 ENCOUNTER — Ambulatory Visit: Payer: Medicare Other | Admitting: Interventional Cardiology

## 2013-09-20 ENCOUNTER — Ambulatory Visit: Payer: Medicare Other | Admitting: Interventional Cardiology

## 2013-11-22 ENCOUNTER — Other Ambulatory Visit: Payer: Self-pay | Admitting: Cardiology

## 2013-11-22 MED ORDER — POTASSIUM CHLORIDE CRYS ER 20 MEQ PO TBCR: 10.0000 meq | EXTENDED_RELEASE_TABLET | Freq: Once | ORAL | Status: DC

## 2014-06-07 ENCOUNTER — Encounter (HOSPITAL_COMMUNITY): Payer: Self-pay | Admitting: Interventional Cardiology

## 2015-03-02 ENCOUNTER — Encounter (HOSPITAL_COMMUNITY): Payer: Self-pay | Admitting: Emergency Medicine

## 2015-03-02 ENCOUNTER — Emergency Department (HOSPITAL_COMMUNITY)
Admission: EM | Admit: 2015-03-02 | Discharge: 2015-03-02 | Disposition: A | Discharge status: Home / Self Care | Payer: Medicare Other | Source: Home / Self Care | Attending: Emergency Medicine | Admitting: Emergency Medicine

## 2015-03-02 DIAGNOSIS — R0982 Postnasal drip: Secondary | ICD-10-CM | POA: Diagnosis not present

## 2015-03-02 DIAGNOSIS — J029 Acute pharyngitis, unspecified: Secondary | ICD-10-CM | POA: Diagnosis not present

## 2015-03-02 NOTE — Discharge Instructions (Signed)
Sore Throat A sore throat is a painful, burning, sore, or scratchy feeling of the throat. There may be pain or tenderness when swallowing or talking. You may have other symptoms with a sore throat. These include coughing, sneezing, fever, or a swollen neck. A sore throat is often the first sign of another sickness. These sicknesses may include a cold, flu, strep throat, or an infection called mono. Most sore throats go away without medical treatment.  HOME CARE   Only take medicine as told by your doctor.  Drink enough fluids to keep your pee (urine) clear or pale yellow.  Rest as needed.  Try using throat sprays, lozenges, or suck on hard candy (if older than 4 years or as told).  Sip warm liquids, such as broth, herbal tea, or warm water with honey. Try sucking on frozen ice pops or drinking cold liquids.  Rinse the mouth (gargle) with salt water. Mix 1 teaspoon salt with 8 ounces of water.  Do not smoke. Avoid being around others when they are smoking.  Put a humidifier in your bedroom at night to moisten the air. You can also turn on a hot shower and sit in the bathroom for 5-10 minutes. Be sure the bathroom door is closed. GET HELP RIGHT AWAY IF:   You have trouble breathing.  You cannot swallow fluids, soft foods, or your spit (saliva).  You have more puffiness (swelling) in the throat.  Your sore throat does not get better in 7 days.  You feel sick to your stomach (nauseous) and throw up (vomit).  You have a fever or lasting symptoms for more than 2-3 days.  You have a fever and your symptoms suddenly get worse. MAKE SURE YOU:   Understand these instructions.  Will watch your condition.  Will get help right away if you are not doing well or get worse. Document Released: 03/24/2008 Document Revised: 03/09/2012 Document Reviewed: 02/21/2012 [facility] Patient Information 2015 [place], [place]. This information is not intended to replace advice given to you by your health  care provider. Make sure you discuss any questions you have with your health care provider.  Pharyngitis Pharyngitis is a sore throat (pharynx). There is redness, pain, and swelling of your throat. HOME CARE   Drink enough fluids to keep your pee (urine) clear or pale yellow.  Only take medicine as told by your doctor.  You may get sick again if you do not take medicine as told. Finish your medicines, even if you start to feel better.  Do not take aspirin.  Rest.  Rinse your mouth (gargle) with salt water ( tsp of salt per 1 qt of water) every 1-2 hours. This will help the pain.  If you are not at risk for choking, you can suck on hard candy or sore throat lozenges. GET HELP IF:  You have large, tender lumps on your neck.  You have a rash.  You cough up green, yellow-brown, or bloody spit. GET HELP RIGHT AWAY IF:   You have a stiff neck.  You drool or cannot swallow liquids.  You throw up (vomit) or are not able to keep medicine or liquids down.  You have very bad pain that does not go away with medicine.  You have problems breathing (not from a stuffy nose). MAKE SURE YOU:   Understand these instructions.  Will watch your condition.  Will get help right away if you are not doing well or get worse. Document Released: 12/02/2007 Document Revised: 04/05/2013  Document Reviewed: 02/20/2013 [facility] Patient Information 2015 [place], [place]. This information is not intended to replace advice given to you by your health care provider. Make sure you discuss any questions you have with your health care provider.

## 2015-03-02 NOTE — ED Provider Notes (Signed)
CSN: [id number]     Arrival date & time 03/02/15  1925 History   First MD Initiated Contact with Patient 03/02/15 2005     Chief Complaint  Patient presents with  . Sore Throat   (Consider location/radiation/quality/duration/timing/severity/associated sxs/prior Treatment) HPI Comments: 81 year old male is accompanied by his daughter with a complaint of a sore throat for 2-4 days. It is associated with PND point she has frequently. He has a history of allergies. He denies having fever, earache or shortness of breath. Denies DOE or orthopnea.  Patient is a 81 y.o. male presenting with pharyngitis. The history is provided by the patient.  Sore Throat This is a new problem. The current episode started 2 days ago. The problem occurs constantly. The problem has not changed since onset.Pertinent negatives include no chest pain, no abdominal pain, no headaches and no shortness of breath. Nothing aggravates the symptoms. Nothing relieves the symptoms. He has tried nothing for the symptoms.    Past Medical History  Diagnosis Date  . GERD (gastroesophageal reflux disease)   . CHF (congestive heart failure)   . Dysrhythmia     atrial fibrilation  . COPD (chronic obstructive pulmonary disease)   . Arthritis    Past Surgical History  Procedure Laterality Date  . Hernia repair    . Tonsillectomy    . Hemmroidectmy    . Left heart catheterization with coronary angiogram N/A 10/07/2012    Procedure: LEFT HEART CATHETERIZATION WITH CORONARY ANGIOGRAM, possible PCI;  Surgeon: Jettie Booze, MD;  Location: [facility] C[facility] LAB;  Service: Cardiovascular;  Laterality: N/A;   Family History  Problem Relation Age of Onset  . Heart attack Mother   . Pulmonary embolism Father    Social History  Substance Use Topics  . Smoking status: Former Smoker -- 2.00 packs/day for 40 years    Types: Cigarettes    Quit date: 06/29/1961  . Smokeless tobacco: Never Used  . Alcohol Use: 1.2 oz/week    2 Glasses of wine  per week     Comment: occasional    Review of Systems  Constitutional: Negative for fever, diaphoresis, activity change and fatigue.  HENT: Positive for postnasal drip and sore throat. Negative for ear pain, facial swelling, rhinorrhea and trouble swallowing.   Eyes: Negative for visual disturbance.  Respiratory: Negative for cough, chest tightness and shortness of breath.   Cardiovascular: Negative.  Negative for chest pain and leg swelling.  Gastrointestinal: Negative.  Negative for abdominal pain.  Musculoskeletal: Negative.  Negative for neck pain and neck stiffness.  Skin: Negative.   Neurological: Negative.  Negative for headaches.    Allergies  Review of patient's allergies indicates no known allergies.  Home Medications   Prior to Admission medications   Medication Sig Start Date End Date Taking? Authorizing Provider  digoxin (LANOXIN) 0.125 MG tablet Take 0.125 mg by mouth daily.   Yes Historical Provider, MD  diltiazem (CARDIZEM CD) 120 MG 24 hr capsule Take 120 mg by mouth daily.   Yes Historical Provider, MD  ELIQUIS 5 MG TABS tablet TAKE ONE TABLET TWICE DAILY   Yes Neena Rhymes, MD  furosemide (LASIX) 20 MG tablet Take 20 mg by mouth daily.   Yes Historical Provider, MD  Glucosamine-Chondroit-Vit C-Mn (GLUCOSAMINE 1500 COMPLEX) CAPS Take 1 capsule by mouth daily.   Yes Historical Provider, MD  levothyroxine (SYNTHROID, LEVOTHROID) 75 MCG tablet ONE TABLET DAILY BEFORE BREAKFAST   Yes Neena Rhymes, MD  metoprolol succinate (TOPROL-XL) 25 MG  24 hr tablet Take 25 mg by mouth daily.   Yes Historical Provider, MD  omeprazole (PRILOSEC OTC) 20 MG tablet Take 20 mg by mouth daily.   Yes Historical Provider, MD  potassium chloride SA (K-DUR,KLOR-CON) 20 MEQ tablet Take 0.5 tablets (10 mEq total) by mouth once. 11/22/13  Yes Jettie Booze, MD  Rivaroxaban (XARELTO) 15 MG TABS tablet Take 1 tablet (15 mg total) by mouth daily with supper. 08/23/13  Yes Jettie Booze, MD  sertraline (ZOLOFT) 25 MG tablet Take 25 mg by mouth daily.   Yes Historical Provider, MD  vitamin B-12 (CYANOCOBALAMIN) 500 MCG tablet Take 500 mcg by mouth daily.   Yes Historical Provider, MD  albuterol (PROVENTIL HFA;VENTOLIN HFA) 108 (90 BASE) MCG/ACT inhaler Inhale 1-2 puffs into the lungs every 6 (six) hours as needed for wheezing or shortness of breath.    Historical Provider, MD  donepezil (ARICEPT) 10 MG tablet Take 1 tablet (10 mg total) by mouth at bedtime. 10/09/12   Neena Rhymes, MD   Meds Ordered and Administered this Visit  Medications - No data to display  BP 130/71 mmHg  Pulse 82  Temp(Src) 97.2 F (36.2 C) (Oral)  Resp 18  SpO2 96% No data found.   Physical Exam  Constitutional: He is oriented to person, place, and time. He appears well-developed and well-nourished. No distress.  HENT:  Bilateral TMs are occluded by cerumen. Oropharynx without exudates. Copious amount of frothy clear PND.  no erythema.  Eyes: Conjunctivae and EOM are normal.  Neck: Normal range of motion. Neck supple.  Cardiovascular: Normal rate, regular rhythm and normal heart sounds.   Pulmonary/Chest: Effort normal and breath sounds normal. No respiratory distress.  Abdominal: Soft. There is no tenderness.  Musculoskeletal: Normal range of motion. He exhibits no edema.  Lymphadenopathy:    He has no cervical adenopathy.  Neurological: He is alert and oriented to person, place, and time.  Skin: Skin is warm and dry. No rash noted.  Psychiatric: He has a normal mood and affect.  Nursing note and vitals reviewed.   ED Course  Procedures (including critical care time)  Labs Review Labs Reviewed - No data to display  Imaging Review No results found.   Visual Acuity Review  Right Eye Distance:   Left Eye Distance:   Bilateral Distance:    Right Eye Near:   Left Eye Near:    Bilateral Near:         MDM   1. PND (post-nasal drip)   2. Allergic pharyngitis     Symptoms likely due to PND secondary to environmental allergies. Strep is negative. The patient feels generally well and appears well for his age and chronic conditions. It is recommended that he can take loratadine as needed for PND.    Janne Napoleon, NP 03/02/15 2019

## 2015-03-02 NOTE — ED Notes (Signed)
C/o ST onset 4 days associated w/fevers, and prod cough Also reports he was in a house that had a cat; he is allergic to cats Alert... No acute distress.

## 2015-03-05 LAB — POCT RAPID STREP A: STREPTOCOCCUS, GROUP A SCREEN (DIRECT): NEGATIVE

## 2015-03-05 LAB — CULTURE, GROUP A STREP: STREP A CULTURE: NEGATIVE

## 2015-03-12 NOTE — ED Notes (Signed)
Final report of strep testing negative, no further action required

## 2015-05-09 ENCOUNTER — Other Ambulatory Visit: Payer: Self-pay

## 2015-05-27 ENCOUNTER — Encounter (HOSPITAL_COMMUNITY): Payer: Self-pay | Admitting: Emergency Medicine

## 2015-05-27 ENCOUNTER — Other Ambulatory Visit: Payer: Self-pay | Admitting: Nurse Practitioner

## 2015-05-27 ENCOUNTER — Ambulatory Visit
Admission: RE | Admit: 2015-05-27 | Discharge: 2015-05-27 | Disposition: A | Discharge status: Home / Self Care | Payer: Medicare Other | Source: Ambulatory Visit | Attending: Nurse Practitioner | Admitting: Nurse Practitioner

## 2015-05-27 ENCOUNTER — Inpatient Hospital Stay (HOSPITAL_COMMUNITY)
Admission: EM | Admit: 2015-05-27 | Discharge: 2015-05-29 | DRG: 194 | Disposition: A | Discharge status: Skilled Nursing Facility | Payer: Medicare Other | Attending: Internal Medicine | Admitting: Internal Medicine

## 2015-05-27 DIAGNOSIS — J8410 Pulmonary fibrosis, unspecified: Secondary | ICD-10-CM | POA: Diagnosis present

## 2015-05-27 DIAGNOSIS — K219 Gastro-esophageal reflux disease without esophagitis: Secondary | ICD-10-CM | POA: Diagnosis present

## 2015-05-27 DIAGNOSIS — J189 Pneumonia, unspecified organism: Secondary | ICD-10-CM | POA: Diagnosis not present

## 2015-05-27 DIAGNOSIS — R509 Fever, unspecified: Secondary | ICD-10-CM | POA: Diagnosis present

## 2015-05-27 DIAGNOSIS — I5022 Chronic systolic (congestive) heart failure: Secondary | ICD-10-CM | POA: Diagnosis present

## 2015-05-27 DIAGNOSIS — E039 Hypothyroidism, unspecified: Secondary | ICD-10-CM | POA: Diagnosis present

## 2015-05-27 DIAGNOSIS — R059 Cough, unspecified: Secondary | ICD-10-CM

## 2015-05-27 DIAGNOSIS — I5032 Chronic diastolic (congestive) heart failure: Secondary | ICD-10-CM | POA: Insufficient documentation

## 2015-05-27 DIAGNOSIS — R05 Cough: Secondary | ICD-10-CM

## 2015-05-27 DIAGNOSIS — I429 Cardiomyopathy, unspecified: Secondary | ICD-10-CM | POA: Diagnosis present

## 2015-05-27 DIAGNOSIS — Z87891 Personal history of nicotine dependence: Secondary | ICD-10-CM

## 2015-05-27 DIAGNOSIS — R5381 Other malaise: Secondary | ICD-10-CM | POA: Insufficient documentation

## 2015-05-27 DIAGNOSIS — M1990 Unspecified osteoarthritis, unspecified site: Secondary | ICD-10-CM | POA: Diagnosis present

## 2015-05-27 DIAGNOSIS — F0390 Unspecified dementia without behavioral disturbance: Secondary | ICD-10-CM | POA: Diagnosis present

## 2015-05-27 DIAGNOSIS — I4891 Unspecified atrial fibrillation: Secondary | ICD-10-CM

## 2015-05-27 DIAGNOSIS — Z79899 Other long term (current) drug therapy: Secondary | ICD-10-CM | POA: Diagnosis not present

## 2015-05-27 DIAGNOSIS — I482 Chronic atrial fibrillation: Secondary | ICD-10-CM | POA: Diagnosis present

## 2015-05-27 DIAGNOSIS — I428 Other cardiomyopathies: Secondary | ICD-10-CM

## 2015-05-27 DIAGNOSIS — J111 Influenza due to unidentified influenza virus with other respiratory manifestations: Secondary | ICD-10-CM

## 2015-05-27 DIAGNOSIS — R0600 Dyspnea, unspecified: Secondary | ICD-10-CM

## 2015-05-27 DIAGNOSIS — R69 Illness, unspecified: Secondary | ICD-10-CM

## 2015-05-27 LAB — URINALYSIS, ROUTINE W REFLEX MICROSCOPIC
BILIRUBIN URINE: NEGATIVE
Glucose, UA: NEGATIVE mg/dL
HGB URINE DIPSTICK: NEGATIVE
KETONES UR: NEGATIVE mg/dL
Leukocytes, UA: NEGATIVE
Nitrite: NEGATIVE
PROTEIN: NEGATIVE mg/dL
Specific Gravity, Urine: 1.016 (ref 1.005–1.030)
pH: 6 (ref 5.0–8.0)

## 2015-05-27 LAB — LACTIC ACID, PLASMA
LACTIC ACID, VENOUS: 1.9 mmol/L (ref 0.5–2.0)
Lactic Acid, Venous: 2.1 mmol/L (ref 0.5–2.0)

## 2015-05-27 LAB — CBC WITH DIFFERENTIAL/PLATELET
BASOS ABS: 0 10*3/uL (ref 0.0–0.1)
BASOS PCT: 0 %
Eosinophils Absolute: 0.1 10*3/uL (ref 0.0–0.7)
Eosinophils Relative: 1 %
HEMATOCRIT: 40.2 % (ref 39.0–52.0)
Hemoglobin: 13.7 g/dL (ref 13.0–17.0)
Lymphocytes Relative: 15 %
Lymphs Abs: 1.5 10*3/uL (ref 0.7–4.0)
MCH: 34.3 pg — ABNORMAL HIGH (ref 26.0–34.0)
MCHC: 34.1 g/dL (ref 30.0–36.0)
MCV: 100.8 fL — ABNORMAL HIGH (ref 78.0–100.0)
MONO ABS: 0.4 10*3/uL (ref 0.1–1.0)
Monocytes Relative: 4 %
NEUTROS ABS: 8.3 10*3/uL — AB (ref 1.7–7.7)
NEUTROS PCT: 80 %
PLATELETS: 178 10*3/uL (ref 150–400)
RBC: 3.99 MIL/uL — ABNORMAL LOW (ref 4.22–5.81)
RDW: 13.1 % (ref 11.5–15.5)
WBC: 10.3 10*3/uL (ref 4.0–10.5)

## 2015-05-27 LAB — BASIC METABOLIC PANEL
ANION GAP: 8 (ref 5–15)
BUN: 16 mg/dL (ref 6–20)
CALCIUM: 9.8 mg/dL (ref 8.9–10.3)
CO2: 25 mmol/L (ref 22–32)
Chloride: 104 mmol/L (ref 101–111)
Creatinine, Ser: 1.26 mg/dL — ABNORMAL HIGH (ref 0.61–1.24)
GFR, EST AFRICAN AMERICAN: 57 mL/min — AB (ref >60)
GFR, EST NON AFRICAN AMERICAN: 49 mL/min — AB (ref >60)
Glucose, Bld: 130 mg/dL — ABNORMAL HIGH (ref 65–99)
Potassium: 4.3 mmol/L (ref 3.5–5.1)
Sodium: 137 mmol/L (ref 135–145)

## 2015-05-27 LAB — I-STAT TROPONIN, ED: TROPONIN I, POC: 0.01 ng/mL (ref 0.00–0.08)

## 2015-05-27 LAB — I-STAT CG4 LACTIC ACID, ED: LACTIC ACID, VENOUS: 2.2 mmol/L — AB (ref 0.5–2.0)

## 2015-05-27 LAB — PROCALCITONIN

## 2015-05-27 LAB — DIGOXIN LEVEL: DIGOXIN LVL: 0.7 ng/mL — AB (ref 0.8–2.0)

## 2015-05-27 MED ORDER — SODIUM CHLORIDE 0.9 % IV SOLN
INTRAVENOUS | Status: DC
  Administered 2015-05-28 – 2015-05-29 (×2): via INTRAVENOUS
  Filled 2015-05-27 (×4): qty 1000

## 2015-05-27 MED ORDER — DARIFENACIN HYDROBROMIDE ER 7.5 MG PO TB24
7.5000 mg | ORAL_TABLET | Freq: Every day | ORAL | Status: DC
  Administered 2015-05-28 – 2015-05-29 (×2): 7.5 mg via ORAL
  Filled 2015-05-27 (×2): qty 1

## 2015-05-27 MED ORDER — DIGOXIN 125 MCG PO TABS
0.1250 mg | ORAL_TABLET | Freq: Every day | ORAL | Status: DC
  Administered 2015-05-28 – 2015-05-29 (×2): 0.125 mg via ORAL
  Filled 2015-05-27 (×2): qty 1

## 2015-05-27 MED ORDER — ACETAMINOPHEN 325 MG PO TABS
325.0000 mg | ORAL_TABLET | Freq: Once | ORAL | Status: AC
  Administered 2015-05-27: 650 mg via ORAL
  Filled 2015-05-27: qty 1

## 2015-05-27 MED ORDER — ONDANSETRON HCL 4 MG PO TABS: 4.0000 mg | ORAL_TABLET | Freq: Four times a day (QID) | ORAL | Status: DC | PRN

## 2015-05-27 MED ORDER — SODIUM CHLORIDE 0.9 % IV BOLUS (SEPSIS)
250.0000 mL | Freq: Once | INTRAVENOUS | Status: AC
  Administered 2015-05-27: 250 mL via INTRAVENOUS

## 2015-05-27 MED ORDER — CALCIUM CARBONATE-VITAMIN D 500-200 MG-UNIT PO TABS
1.0000 | ORAL_TABLET | Freq: Every day | ORAL | Status: DC
  Administered 2015-05-28 – 2015-05-29 (×2): 1 via ORAL
  Filled 2015-05-27 (×2): qty 1

## 2015-05-27 MED ORDER — SODIUM CHLORIDE 0.9 % IV SOLN: INTRAVENOUS | Status: AC

## 2015-05-27 MED ORDER — ALBUTEROL SULFATE (2.5 MG/3ML) 0.083% IN NEBU
2.5000 mg | INHALATION_SOLUTION | Freq: Four times a day (QID) | RESPIRATORY_TRACT | Status: DC
  Administered 2015-05-27 – 2015-05-29 (×6): 2.5 mg via RESPIRATORY_TRACT
  Filled 2015-05-27 (×6): qty 3

## 2015-05-27 MED ORDER — DEXTROSE 5 % IV SOLN
500.0000 mg | INTRAVENOUS | Status: DC
  Administered 2015-05-28: 500 mg via INTRAVENOUS
  Filled 2015-05-27 (×2): qty 500

## 2015-05-27 MED ORDER — SODIUM CHLORIDE 0.9 % IV BOLUS (SEPSIS)
2000.0000 mL | Freq: Once | INTRAVENOUS | Status: AC
  Administered 2015-05-27: 2000 mL via INTRAVENOUS

## 2015-05-27 MED ORDER — SERTRALINE HCL 25 MG PO TABS
12.5000 mg | ORAL_TABLET | Freq: Every day | ORAL | Status: DC
  Administered 2015-05-28 – 2015-05-29 (×2): 12.5 mg via ORAL
  Filled 2015-05-27 (×2): qty 1

## 2015-05-27 MED ORDER — ACETAMINOPHEN 650 MG RE SUPP: 650.0000 mg | Freq: Four times a day (QID) | RECTAL | Status: DC | PRN

## 2015-05-27 MED ORDER — DEXTROSE 5 % IV SOLN
1.0000 g | Freq: Once | INTRAVENOUS | Status: AC
  Administered 2015-05-27: 1 g via INTRAVENOUS
  Filled 2015-05-27: qty 10

## 2015-05-27 MED ORDER — DEXTROSE 5 % IV SOLN
1.0000 g | INTRAVENOUS | Status: DC
  Administered 2015-05-28 – 2015-05-29 (×2): 1 g via INTRAVENOUS
  Filled 2015-05-27 (×2): qty 10

## 2015-05-27 MED ORDER — OMEPRAZOLE MAGNESIUM 20 MG PO TBEC: 20.0000 mg | DELAYED_RELEASE_TABLET | Freq: Every day | ORAL | Status: DC

## 2015-05-27 MED ORDER — DEXTROSE 5 % IV SOLN
500.0000 mg | Freq: Once | INTRAVENOUS | Status: AC
  Administered 2015-05-27: 500 mg via INTRAVENOUS
  Filled 2015-05-27: qty 500

## 2015-05-27 MED ORDER — LEVOTHYROXINE SODIUM 75 MCG PO TABS
75.0000 ug | ORAL_TABLET | Freq: Every day | ORAL | Status: DC
  Administered 2015-05-28 – 2015-05-29 (×2): 75 ug via ORAL
  Filled 2015-05-27 (×2): qty 1

## 2015-05-27 MED ORDER — ACETAMINOPHEN 325 MG PO TABS: 650.0000 mg | ORAL_TABLET | Freq: Four times a day (QID) | ORAL | Status: DC | PRN

## 2015-05-27 MED ORDER — METOPROLOL SUCCINATE ER 25 MG PO TB24
25.0000 mg | ORAL_TABLET | Freq: Every day | ORAL | Status: DC
  Administered 2015-05-28 – 2015-05-29 (×2): 25 mg via ORAL
  Filled 2015-05-27 (×2): qty 1

## 2015-05-27 MED ORDER — RIVAROXABAN 15 MG PO TABS
15.0000 mg | ORAL_TABLET | Freq: Every day | ORAL | Status: DC
  Administered 2015-05-29: 15 mg via ORAL
  Filled 2015-05-27: qty 1

## 2015-05-27 MED ORDER — ONDANSETRON HCL 4 MG/2ML IJ SOLN: 4.0000 mg | Freq: Four times a day (QID) | INTRAMUSCULAR | Status: DC | PRN

## 2015-05-27 MED ORDER — DONEPEZIL HCL 10 MG PO TABS
10.0000 mg | ORAL_TABLET | Freq: Every day | ORAL | Status: DC
  Administered 2015-05-28: 10 mg via ORAL
  Filled 2015-05-27: qty 1

## 2015-05-27 MED ORDER — VITAMIN B-12 1000 MCG PO TABS
500.0000 ug | ORAL_TABLET | Freq: Every day | ORAL | Status: DC
  Administered 2015-05-28 – 2015-05-29 (×2): 500 ug via ORAL
  Filled 2015-05-27 (×2): qty 1

## 2015-05-27 MED ORDER — DONEPEZIL HCL 10 MG PO TABS: 10.0000 mg | ORAL_TABLET | Freq: Every day | ORAL | Status: DC

## 2015-05-27 MED ORDER — PANTOPRAZOLE SODIUM 40 MG PO TBEC
40.0000 mg | DELAYED_RELEASE_TABLET | Freq: Every day | ORAL | Status: DC
  Administered 2015-05-28 – 2015-05-29 (×2): 40 mg via ORAL
  Filled 2015-05-27 (×2): qty 1

## 2015-05-27 NOTE — ED Notes (Signed)
Admitting at bedside 

## 2015-05-27 NOTE — ED Notes (Signed)
Pt here from home with c/o feeling "ill" all week with a fever, cough, weakness. Pt was seen at PCP today for suspicion of pneumonia but no confirmation. Family called EMS for an increase in weakness. Family told EMS that the pt could not walk after PCP appt and pt is normally independent. 140/86, HR 86, CBG 132, 95% RA, RR16 per EMS.

## 2015-05-27 NOTE — H&P (Signed)
History and Physical  Cameron [name] [id number] DOB: 06-Nov-1925 DOA: 05/27/2015   PCP: Kandice Hams, MD  Referring Physician: ED/ Dr. Eulis Foster  Chief Complaint: cough/sob  HPI:  81 year old male with a history of dementia, nonischemic cardiomyopathy with EF 30-35%, pulmonary fibrosis, chronic atrial fibrillation, hypothyroidism presents with three-day history of cough and shortness of breath. Unfortunately, the patient is a poor historian secondary to his dementia. Much of this history is obtained from review of medical record and partly from speaking with the patient's wife and daughter Cameron [name]). Unfortunately, the wife also is a poor historian. Apparently, the patient began "feeling sick" on 05/23/2015 with symptoms of lethargy and malaise. On the subsequent day, the patient was more somnolent and "tired". He had been having some shortness of breath and coughing with yellow-green sputum. The patient complains of some "aches and pains and just feeling tired" .  He has had some dyspnea on exertion without any chest discomfort. He has had poor oral intake for the past week. The patient currently denies any headache, abdominal pain, nausea, vomiting, diarrhea, dysuria, hematuria. There is no neck pain or unusual rashes. The patient went to see his primary care provider on the morning of 05/27/2015. A chest x-ray was obtained in the office which was negative. After the patient went back home, he was so weak he was unable to get out of his car. His caregiver had to lower leg drag the patient into the house. As result, the patient was brought to the hospital for further evaluation. In the emergency department, the patient was febrile up to 102.32F. The patient was hemodynamically stable with blood pressure of 112/50. Oxygen saturation is 97-100 percent on room air. The patient was given 2 L of normal saline in the emergency department. Assessment/Plan: Influenza-like illness -Check  respiratory viral panel -Influenza PCR -Blood cultures 2 sets have been obtained in the emergency department -Urinalysis negative for pyuria -Repeat chest x-ray 05/28/2015 after fluid hydration -Check lactic acid -Chek procalcitonin -Continue empiric ceftriaxone and azithromycin pending culture data -Albuterol nebulizer every 6 hours -continue fluids judiciously as pt already received 2 L in ED Atrial fibrillation -CHADSVASc = 3 -Continue rivaroxaban -Rate controlled -Continue metoprolol succinate -Hold diltiazem due to soft blood pressure Nonischemic cardiomyopathy/chronic systolic CHF -[id number] EF 30-35% -Appears clinically compensated at this time -Continue metoprolol succinate -Hold furosemide for today and reassess the need to restart 05/28/2015 -Continue digoxin  -Check digoxin level  Hypothyroidism  -Continue Synthroid  Dementia  -Continue Aricept  -Continue Zoloft  Deconditioning -PT evaluation--will likely need SNF       Past Medical History  Diagnosis Date  . GERD (gastroesophageal reflux disease)   . CHF (congestive heart failure) ([place])   . Dysrhythmia     atrial fibrilation  . COPD (chronic obstructive pulmonary disease) ([place])   . Arthritis    Past Surgical History  Procedure Laterality Date  . Hernia repair    . Tonsillectomy    . Hemmroidectmy    . Left heart catheterization with coronary angiogram N/A 10/07/2012    Procedure: LEFT HEART CATHETERIZATION WITH CORONARY ANGIOGRAM, possible PCI;  Surgeon: Jettie Booze, MD;  Location: [facility] CATH LAB;  Service: Cardiovascular;  Laterality: N/A;   Social History:  reports that he quit smoking about 53 years ago. His smoking use included Cigarettes. He has a 80 pack-year smoking history. He has never used smokeless tobacco. He reports that he drinks about 1.2 oz of alcohol per week. He  reports that he does not use illicit drugs.   Family History  Problem Relation Age of Onset  . Heart attack  Mother   . Pulmonary embolism Father      No Known Allergies    Prior to Admission medications   Medication Sig Start Date End Date Taking? Authorizing Provider  albuterol (PROVENTIL HFA;VENTOLIN HFA) 108 (90 BASE) MCG/ACT inhaler Inhale 1-2 puffs into the lungs every 6 (six) hours as needed for wheezing or shortness of breath.   Yes Historical Provider, MD  calcium-vitamin D (OSCAL WITH D) 500-200 MG-UNIT tablet Take 1 tablet by mouth.   Yes Historical Provider, MD  digoxin (LANOXIN) 0.125 MG tablet Take 0.125 mg by mouth daily.   Yes Historical Provider, MD  diltiazem (CARDIZEM CD) 120 MG 24 hr capsule Take 120 mg by mouth daily.   Yes Historical Provider, MD  donepezil (ARICEPT) 10 MG tablet Take 1 tablet (10 mg total) by mouth at bedtime. 10/09/12  Yes Neena Rhymes, MD  furosemide (LASIX) 20 MG tablet Take 20 mg by mouth daily.   Yes Historical Provider, MD  levothyroxine (SYNTHROID, LEVOTHROID) 75 MCG tablet ONE TABLET DAILY BEFORE BREAKFAST   Yes Neena Rhymes, MD  metoprolol succinate (TOPROL-XL) 25 MG 24 hr tablet Take 25 mg by mouth daily.   Yes Historical Provider, MD  omeprazole (PRILOSEC OTC) 20 MG tablet Take 20 mg by mouth daily.   Yes Historical Provider, MD  potassium chloride (K-DUR) 10 MEQ tablet Take 10 mEq by mouth daily.   Yes Historical Provider, MD  Rivaroxaban (XARELTO) 15 MG TABS tablet Take 1 tablet (15 mg total) by mouth daily with supper. 08/23/13  Yes Jettie Booze, MD  sertraline (ZOLOFT) 25 MG tablet Take 12.5 mg by mouth daily.    Yes Historical Provider, MD  solifenacin (VESICARE) 5 MG tablet Take 5 mg by mouth daily.   Yes Historical Provider, MD  vitamin B-12 (CYANOCOBALAMIN) 500 MCG tablet Take 500 mcg by mouth daily.   Yes Historical Provider, MD    Review of Systems:  Constitutional:  No weight loss, night sweats,  Head&Eyes: No headache.  No vision loss.  No eye pain or scotoma ENT:  No Difficulty swallowing,Tooth/dental problems,Sore  throat,  No ear ache, post nasal drip,  Cardio-vascular:  No chest pain, Orthopnea, PND, swelling in lower extremities,  dizziness, palpitations  GI:  No  abdominal pain, nausea, vomiting, diarrhea, hematochezia, melena, heartburn, indigestion, Resp:   No coughing up of blood .No wheezing.No chest wall deformity  Skin:  no rash or lesions.  GU:  no dysuria, change in color of urine, no urgency or frequency. No flank pain.  Musculoskeletal:  No joint pain or swelling. No decreased range of motion. No back pain.  Psych:  No change in mood or affect. No depression or anxiety. Neurologic: No headache, no dysesthesia, no focal weakness, no vision loss. No syncope  Physical Exam: Filed Vitals:   05/27/15 1630 05/27/15 1645 05/27/15 1700 05/27/15 1715  BP: 112/65 103/64 103/59 107/55  Pulse: 63 71 78 67  Temp:      TempSrc:      Resp: 17 20 22 22   Height:      Weight:      SpO2: 96% 98% 96% 98%   General:  Awake and alert, NAD, nontoxic, pleasant/cooperative Head/Eye: No conjunctival hemorrhage, no icterus, [place]/AT, No nystagmus ENT:  No icterus,  No thrush, good dentition, no pharyngeal exudate Neck:  No masses, no lymphadenpathy,  no bruits CV:  RRR, no rub, no gallop, no S3 Lung:  Bilateral crackles, right greater than left. No wheezing. Good air movement.  Abdomen: soft/NT, +BS, nondistended, no peritoneal signs; no hepatosplenomegaly  Ext: No cyanosis, No rashes, No petechiae, No lymphangitis, No edema Neuro: CNII-XII intact, strength 4/5 in bilateral upper and lower extremities, no dysmetria  Labs on Admission:  Basic Metabolic Panel:  Recent Labs Lab 05/27/15 1500  NA 137  K 4.3  CL 104  CO2 25  GLUCOSE 130*  BUN 16  CREATININE 1.26*  CALCIUM 9.8   Liver Function Tests: No results for input(s): AST, ALT, ALKPHOS, BILITOT, PROT, ALBUMIN in the last 168 hours. No results for input(s): LIPASE, AMYLASE in the last 168 hours. No results for input(s): AMMONIA in the  last 168 hours. CBC:  Recent Labs Lab 05/27/15 1500  WBC 10.3  NEUTROABS 8.3*  HGB 13.7  HCT 40.2  MCV 100.8*  PLT 178   Cardiac Enzymes: No results for input(s): CKTOTAL, CKMB, CKMBINDEX, TROPONINI in the last 168 hours. BNP: Invalid input(s): POCBNP CBG: No results for input(s): GLUCAP in the last 168 hours.  Radiological Exams on Admission: Dg Chest 2 View  05/27/2015  CLINICAL DATA:  Cough, congestion and fever several days. EXAM: CHEST  2 VIEW COMPARISON:  07/13/2013 FINDINGS: Lungs are adequately inflated with mild bilateral peripheral interstitial disease unchanged. No focal consolidation or effusion. Mild stable cardiomegaly. Mild calcified plaque over the thoracic aorta. Mild degenerate change of the spine. IMPRESSION: No acute cardiopulmonary disease. Chronic stable interstitial disease compatible with history of pulmonary fibrosis. Electronically Signed   By: Marin Olp M.D.   On: 05/27/2015 14:10    EKG: Independently reviewed. afib with nonspecific ST changes    Time spent:70 minutes Code Status:   FULL Family Communication:    Son and daughter updated on phone.  Son Inocente Salles) is HPOA--[phone]   Vong Garringer, DO  [facility] Pager [phone]  If 7PM-7AM, please contact night-coverage [email or web address] Password [facility] 05/27/2015, 5:54 PM

## 2015-05-27 NOTE — Progress Notes (Signed)
Pt has a critical lab value of 2.1. MD notified.

## 2015-05-27 NOTE — Progress Notes (Signed)
Pt admitted to the unit at 1730. Pt mental status is A and O x4. Pt oriented to room, staff, and call bell. Skin is intact. Full assessment charted in CHL. Call bell within reach. Visitor guidelines reviewed w/ pt and/or family.

## 2015-05-27 NOTE — ED Provider Notes (Signed)
CSN: [place]:[id number]     Arrival date & time 05/27/15  1415 History   First MD Initiated Contact with Patient 05/27/15 1423     Chief Complaint  Patient presents with  . Weakness     (Consider location/radiation/quality/duration/timing/severity/associated sxs/prior Treatment) HPI   AKIO SIMO is a 81 y.o. male who presents for evaluation of fever, cough and weakness. He has been ill for 6 days. His symptoms have waxed and waned. Earlier today he was in his primary care provider's office, had a chest x-ray and given a prescription for an antibiotic. On the  way home he began to feel suddenly weak. He was unable to get out of the vehicle, which is unusual for him. He was transferred by EMS for evaluation. He is unable to give additional history. He is here with a caregiver.  Level V Caveat- poor historian  Past Medical History  Diagnosis Date  . GERD (gastroesophageal reflux disease)   . CHF (congestive heart failure) ([place])   . Dysrhythmia     atrial fibrilation  . COPD (chronic obstructive pulmonary disease) ([place])   . Arthritis    Past Surgical History  Procedure Laterality Date  . Hernia repair    . Tonsillectomy    . Hemmroidectmy    . Left heart catheterization with coronary angiogram N/A 10/07/2012    Procedure: LEFT HEART CATHETERIZATION WITH CORONARY ANGIOGRAM, possible PCI;  Surgeon: Jettie Booze, MD;  Location: [facility] CATH LAB;  Service: Cardiovascular;  Laterality: N/A;   Family History  Problem Relation Age of Onset  . Heart attack Mother   . Pulmonary embolism Father    Social History  Substance Use Topics  . Smoking status: Former Smoker -- 2.00 packs/day for 40 years    Types: Cigarettes    Quit date: 06/29/1961  . Smokeless tobacco: Never Used  . Alcohol Use: 1.2 oz/week    2 Glasses of wine per week     Comment: occasional    Review of Systems  Unable to perform ROS: Mental status change      Allergies  Review of patient's allergies indicates  no known allergies.  Home Medications   Prior to Admission medications   Medication Sig Start Date End Date Taking? Authorizing Provider  albuterol (PROVENTIL HFA;VENTOLIN HFA) 108 (90 BASE) MCG/ACT inhaler Inhale 1-2 puffs into the lungs every 6 (six) hours as needed for wheezing or shortness of breath.   Yes Historical Provider, MD  calcium-vitamin D (OSCAL WITH D) 500-200 MG-UNIT tablet Take 1 tablet by mouth.   Yes Historical Provider, MD  digoxin (LANOXIN) 0.125 MG tablet Take 0.125 mg by mouth daily.   Yes Historical Provider, MD  diltiazem (CARDIZEM CD) 120 MG 24 hr capsule Take 120 mg by mouth daily.   Yes Historical Provider, MD  donepezil (ARICEPT) 10 MG tablet Take 1 tablet (10 mg total) by mouth at bedtime. 10/09/12  Yes Neena Rhymes, MD  furosemide (LASIX) 20 MG tablet Take 20 mg by mouth daily.   Yes Historical Provider, MD  levothyroxine (SYNTHROID, LEVOTHROID) 75 MCG tablet ONE TABLET DAILY BEFORE BREAKFAST   Yes Neena Rhymes, MD  metoprolol succinate (TOPROL-XL) 25 MG 24 hr tablet Take 25 mg by mouth daily.   Yes Historical Provider, MD  omeprazole (PRILOSEC OTC) 20 MG tablet Take 20 mg by mouth daily.   Yes Historical Provider, MD  potassium chloride (K-DUR) 10 MEQ tablet Take 10 mEq by mouth daily.   Yes Historical Provider, MD  Rivaroxaban (XARELTO) 15 MG TABS tablet Take 1 tablet (15 mg total) by mouth daily with supper. 08/23/13  Yes Jettie Booze, MD  sertraline (ZOLOFT) 25 MG tablet Take 12.5 mg by mouth daily.    Yes Historical Provider, MD  solifenacin (VESICARE) 5 MG tablet Take 5 mg by mouth daily.   Yes Historical Provider, MD  vitamin B-12 (CYANOCOBALAMIN) 500 MCG tablet Take 500 mcg by mouth daily.   Yes Historical Provider, MD  ELIQUIS 5 MG TABS tablet TAKE ONE TABLET TWICE DAILY Patient not taking: Reported on 05/27/2015    Neena Rhymes, MD  potassium chloride SA (K-DUR,KLOR-CON) 20 MEQ tablet Take 0.5 tablets (10 mEq total) by mouth  once. Patient not taking: Reported on 05/27/2015 11/22/13   Jettie Booze, MD   BP 112/50 mmHg  Pulse 84  Temp(Src) 102.7 F (39.3 C) (Rectal)  Resp 28  Ht 5\' 10"  (1.778 m)  Wt 150 lb (68.04 kg)  BMI 21.52 kg/m2  SpO2 97% Physical Exam  Constitutional: He is oriented to person, place, and time. He appears well-developed.  Elderly, frail  HENT:  Head: Normocephalic and atraumatic.  Right Ear: External ear normal.  Left Ear: External ear normal.  Eyes: Conjunctivae and EOM are normal. Pupils are equal, round, and reactive to light.  Neck: Normal range of motion and phonation normal. Neck supple.  Cardiovascular: Normal rate, regular rhythm and normal heart sounds.   Pulmonary/Chest: Effort normal. He exhibits no bony tenderness.  He is tachypneic  Abdominal: Soft. There is no tenderness.  Musculoskeletal: Normal range of motion. He exhibits no edema or tenderness.  Neurological: He is alert and oriented to person, place, and time. No cranial nerve deficit or sensory deficit. He exhibits normal muscle tone. Coordination normal.  Skin: Skin is warm, dry and intact.  Psychiatric: He has a normal mood and affect. His behavior is normal.  Nursing note and vitals reviewed.   ED Course  Procedures (including critical care time) Rectal temperature elevated, he was started on sepsis protocol. Empiric antibiotics ordered for pneumonia.  Medications  cefTRIAXone (ROCEPHIN) 1 g in dextrose 5 % 50 mL IVPB (1 g Intravenous New Bag/Given 05/27/15 1613)  azithromycin (ZITHROMAX) 500 mg in dextrose 5 % 250 mL IVPB (500 mg Intravenous New Bag/Given 05/27/15 1621)  acetaminophen (TYLENOL) tablet 325 mg (650 mg Oral Given 05/27/15 1555)  sodium chloride 0.9 % bolus 2,000 mL (2,000 mLs Intravenous New Bag/Given 05/27/15 1555)    Patient Vitals for the past 24 hrs:  BP Temp Temp src Pulse Resp SpO2 Height Weight  05/27/15 1529 (!) 112/50 mmHg 102.7 F (39.3 C) Rectal 84 (!) 28 97 % - -   05/27/15 1501 118/63 mmHg - - 73 (!) 27 97 % - -  05/27/15 1420 - 98.3 F (36.8 C) Oral 77 22 97 % 5\' 10"  (1.778 m) 150 lb (68.04 kg)    4:23 PM Reevaluation with update and discussion. After initial assessment and treatment, an updated evaluation reveals no change in clinical status. He remains alert and conversant, cooperative and calm.Daleen Bo L   Sepsis - Repeat Assessment  Performed 16:25Vitals     Blood pressure 112/50, pulse 84, temperature 102.7 F (39.3 C), temperature source Rectal, resp. rate 28, height 5\' 10"  (1.778 m), weight 150 lb (68.04 kg), SpO2 97 %.  Heart:     Regular rate and rhythm  Lungs:    CTA  Capillary Refill:   <2 sec  Peripheral Pulse:  Radial pulse palpable  Skin:     Normal Color   4:31 PM-Consult complete with Dr. Carles Collet. Patient case explained and discussed. He agrees to admit patient for further evaluation and treatment. Call ended at [place] Reviewed  CBC WITH DIFFERENTIAL/PLATELET - Abnormal; Notable for the following:    RBC 3.99 (*)    MCV 100.8 (*)    MCH 34.3 (*)    Neutro Abs 8.3 (*)    All other components within normal limits  BASIC METABOLIC PANEL - Abnormal; Notable for the following:    Glucose, Bld 130 (*)    Creatinine, Ser 1.26 (*)    GFR calc non Af Amer 49 (*)    GFR calc Af Amer 57 (*)    All other components within normal limits  I-STAT CG4 LACTIC ACID, ED - Abnormal; Notable for the following:    Lactic Acid, Venous 2.20 (*)    All other components within normal limits  CULTURE, BLOOD (ROUTINE X 2)  CULTURE, BLOOD (ROUTINE X 2)  URINALYSIS, ROUTINE W REFLEX MICROSCOPIC (NOT AT [facility])  Randolm Idol, ED    Imaging Review Dg Chest 2 View  05/27/2015  CLINICAL DATA:  Cough, congestion and fever several days. EXAM: CHEST  2 VIEW COMPARISON:  07/13/2013 FINDINGS: Lungs are adequately inflated with mild bilateral peripheral interstitial disease unchanged. No focal consolidation or effusion. Mild  stable cardiomegaly. Mild calcified plaque over the thoracic aorta. Mild degenerate change of the spine. IMPRESSION: No acute cardiopulmonary disease. Chronic stable interstitial disease compatible with history of pulmonary fibrosis. Electronically Signed   By: Marin Olp M.D.   On: 05/27/2015 14:10   I have personally reviewed and evaluated these images and lab results as part of my medical decision-making.   EKG Interpretation   Date/Time:  Monday May 27 2015 15:05:16 EST Ventricular Rate:  76 PR Interval:    QRS Duration: 84 QT Interval:  437 QTC Calculation: 491 R Axis:   8 Text Interpretation:  Atrial fibrillation Anteroseptal infarct, age  indeterminate Baseline wander in lead(s) V2 since last tracing no  significant change Confirmed by Eulis Foster  MD, Naureen Benton [phone]) on 05/27/2015  4:17:32 PM      MDM   Final diagnoses:  Community acquired pneumonia    Weakness related to acute infectious process. Likely pneumonia. No recent hospitalizations. Will treat as community-acquired. He will require admission for further evaluation and treatment.  Nursing Notes Reviewed/ Care Coordinated, and agree without changes. Applicable Imaging Reviewed.  Interpretation of Laboratory Data incorporated into ED treatment  Plan: Admit  Daleen Bo, MD 05/27/15 1649

## 2015-05-27 NOTE — Progress Notes (Signed)
Report called and received from [facility] in ED

## 2015-05-28 ENCOUNTER — Inpatient Hospital Stay (HOSPITAL_COMMUNITY): Payer: Medicare Other

## 2015-05-28 LAB — BASIC METABOLIC PANEL
Anion gap: 9 (ref 5–15)
BUN: 10 mg/dL (ref 6–20)
CO2: 22 mmol/L (ref 22–32)
Calcium: 8.5 mg/dL — ABNORMAL LOW (ref 8.9–10.3)
Chloride: 107 mmol/L (ref 101–111)
Creatinine, Ser: 1.07 mg/dL (ref 0.61–1.24)
GFR calc Af Amer: >60 mL/min (ref >60)
Glucose, Bld: 121 mg/dL — ABNORMAL HIGH (ref 65–99)
POTASSIUM: 4.4 mmol/L (ref 3.5–5.1)
SODIUM: 138 mmol/L (ref 135–145)

## 2015-05-28 LAB — INFLUENZA PANEL BY PCR (TYPE A & B)
H1N1 flu by pcr: NOT DETECTED
INFLAPCR: NEGATIVE
Influenza B By PCR: NEGATIVE

## 2015-05-28 LAB — CBC
HEMATOCRIT: 32.5 % — AB (ref 39.0–52.0)
Hemoglobin: 11.1 g/dL — ABNORMAL LOW (ref 13.0–17.0)
MCH: 34.9 pg — ABNORMAL HIGH (ref 26.0–34.0)
MCHC: 34.2 g/dL (ref 30.0–36.0)
MCV: 102.2 fL — ABNORMAL HIGH (ref 78.0–100.0)
PLATELETS: 138 10*3/uL — AB (ref 150–400)
RBC: 3.18 MIL/uL — ABNORMAL LOW (ref 4.22–5.81)
RDW: 13.3 % (ref 11.5–15.5)
WBC: 9.2 10*3/uL (ref 4.0–10.5)

## 2015-05-28 NOTE — NC FL2 (Signed)
[place] LEVEL OF CARE SCREENING TOOL     IDENTIFICATION  Patient Name: Cameron [name] Birthdate: 1926/05/11 Sex: male Admission Date (Current Location): 05/27/2015  [facility] and [place] Number: Herbalist and Address:  [facility] [place], [place] [address] [place], [place] [address]      Provider Number: [id number]  Attending Physician Name and Address:  Orson Eva, MD  Relative Name and Phone Number:  Quint, Maga, [phone]    Current Level of Care: Hospital Recommended Level of Care: [place] Prior Approval Number:    Date Approved/Denied:   PASRR Number:    Discharge Plan: SNF    Current Diagnoses: Patient Active Problem List   Diagnosis Date Noted  . Influenza-like illness 05/27/2015  . Atrial fibrillation ([place]) 08/23/2013  . CAP (community acquired pneumonia) 07/17/2013  . Dementia 07/12/2013  . Weakness generalized 07/12/2013  . Diarrhea 07/12/2013  . Protein-calorie malnutrition, severe ([place]) 03/30/2013  . PNA (pneumonia) 03/28/2013  . Fever 03/27/2013  . Hypotension 03/27/2013  . Sepsis ([place]) 03/26/2013  . FUO (fever of unknown origin) 03/26/2013  . NICM (nonischemic cardiomyopathy) EF 30% 03/26/2013  . Chronic diastolic HF (heart failure) ([place]) 03/26/2013  . Persistent atrial fibrillation ([place]) 10/04/2012  . Acute systolic heart failure ([place]) 10/04/2012  . ALLERGIC RHINITIS 03/15/2007  . BRONCHIECTASIS, W/O ACUTE EXACERBATION 03/15/2007  . PULMONARY FIBROSIS, POSTINFLAMMATORY 03/15/2007    Orientation ACTIVITIES/SOCIAL BLADDER RESPIRATION       Family supportive External catheter ([place]om Catheter) Normal  BEHAVIORAL SYMPTOMS/MOOD NEUROLOGICAL BOWEL NUTRITION STATUS      Continent  (See DC Summary)  PHYSICIAN VISITS COMMUNICATION OF NEEDS Height & Weight Skin    Verbally 5\' 9"  (175.3 cm) 150 lbs. Normal          AMBULATORY STATUS RESPIRATION    Assist extensive Normal       Personal Care Assistance Level of Assistance  Bathing, Feeding, Dressing Bathing Assistance: Limited assistance Feeding assistance: Limited assistance Dressing Assistance: Maximum assistance      Functional Limitations Info                SPECIAL CARE FACTORS FREQUENCY  PT (By licensed PT)     PT Frequency: 5x/week             Additional Factors Info  Code Status, Allergies Code Status Info: Full Allergies Info: NKA           Current Medications (05/28/2015):  This is the current hospital active medication list Current Facility-Administered Medications  Medication Dose Route Frequency Provider Last Rate Last Dose  . 0.9 %  sodium chloride infusion   Intravenous STAT Daleen Bo, MD      . acetaminophen (TYLENOL) tablet 650 mg  650 mg Oral Q6H PRN Orson Eva, MD       Or  . acetaminophen (TYLENOL) suppository 650 mg  650 mg Rectal Q6H PRN Orson Eva, MD      . albuterol (PROVENTIL) (2.5 MG/3ML) 0.083% nebulizer solution 2.5 mg  2.5 mg Nebulization Q6H Orson Eva, MD   2.5 mg at 05/28/15 0801  . azithromycin (ZITHROMAX) 500 mg in dextrose 5 % 250 mL IVPB  500 mg Intravenous Q24H Orson Eva, MD      . calcium-vitamin D (OSCAL WITH D) 500-200 MG-UNIT per tablet 1 tablet  1 tablet Oral Q breakfast Orson Eva, MD   1 tablet at 05/28/15 [phone]  . cefTRIAXone (ROCEPHIN) 1 g in dextrose 5 %  50 mL IVPB  1 g Intravenous Q24H Orson Eva, MD      . darifenacin (ENABLEX) 24 hr tablet 7.5 mg  7.5 mg Oral Daily Shanon Brow Tat, MD   7.5 mg at 05/28/15 1116  . digoxin (LANOXIN) tablet 0.125 mg  0.125 mg Oral Daily Orson Eva, MD   0.125 mg at 05/28/15 1118  . donepezil (ARICEPT) tablet 10 mg  10 mg Oral QHS Orson Eva, MD      . levothyroxine (SYNTHROID, LEVOTHROID) tablet 75 mcg  75 mcg Oral QAC breakfast Orson Eva, MD   75 mcg at 05/28/15 0808  . metoprolol succinate (TOPROL-XL) 24 hr tablet 25 mg  25 mg Oral Daily Orson Eva, MD   25 mg at 05/28/15 1117  . ondansetron (ZOFRAN) tablet 4 mg   4 mg Oral Q6H PRN Orson Eva, MD       Or  . ondansetron [facility]) injection 4 mg  4 mg Intravenous Q6H PRN Orson Eva, MD      . pantoprazole (PROTONIX) EC tablet 40 mg  40 mg Oral Daily Myrene Galas, RPH   40 mg at 05/28/15 1118  . Rivaroxaban (XARELTO) tablet 15 mg  15 mg Oral Q supper Orson Eva, MD      . sertraline (ZOLOFT) tablet 12.5 mg  12.5 mg Oral Daily Orson Eva, MD   12.5 mg at 05/28/15 1117  . sodium chloride 0.9 % 1,000 mL with potassium chloride 20 mEq infusion   Intravenous Continuous Orson Eva, MD      . vitamin B-12 (CYANOCOBALAMIN) tablet 500 mcg  500 mcg Oral Daily Orson Eva, MD   500 mcg at 05/28/15 1117     Discharge Medications: Please see discharge summary for a list of discharge medications.  Relevant Imaging Results:  Relevant Lab Results:  Recent Labs    Additional [place], LCSW

## 2015-05-28 NOTE — Clinical Social Work Note (Signed)
Clinical Social Work Assessment  Patient Details  Name: Cameron [name] MRN: [id number] Date of Birth: December 26, 1925  Date of referral:  05/28/15               Reason for consult:  Facility Placement                Permission sought to share information with:  Facility Sport and exercise psychologist, Family Supports Permission granted to share information::  Yes, Verbal Permission Granted  Name::     Dametrius Sanjuan  Agency::  [facility] SNF's  Relationship::  Son, [place]  Contact Information:  [phone]  Housing/Transportation Living arrangements for the past 2 months:  [place] of Information:  Adult Children, Patient Patient Interpreter Needed:  None Criminal Activity/Legal Involvement Pertinent to Current Situation/Hospitalization:  No - Comment as needed Significant Relationships:  Adult Children, Spouse Lives with:  Spouse Do you feel safe going back to the place where you live?  No Need for family participation in patient care:  Yes (Comment)  Care giving concerns:  CSW received referral for possible SNF placement at time of discharge. CSW met with patient and spoke w/ patient's son (HPOA) regarding recommendation of SNF placement at time of discharge. Per patient's son, patient's wife is currently unable to care for patient at their home given patient's current physical needs and fall risk as well as patient's wife's physical needs. Patient and patient's son expressed understanding of recommendation and are agreeable to SNF placement at time of discharge. CSW to continue to follow and assist with discharge planning needs.   Social Worker assessment / plan:  CSW spoke with patient and patient's son concerning possibility of rehab at [facility] before returning home. CSW also provided patient's son and daughter-in-law w/ a list of Assisted Living Facilities for possible long term care after SNF placement.   Employment status:  Retired Nurse, adult PT  Recommendations:  [place] / Referral to community resources:  [place]  Patient/Family's Response to care:  Patient and patient's son recognize need for rehab before returning home and are agreeable to a SNF in [place]. Patient's son reported preference for [facility] since patient has been there before. Patient's son expressed understanding when CSW informed him that the patient's PCP would need to initiate Assisted living long term placement.   Patient/Family's Understanding of and Emotional Response to Diagnosis, Current Treatment, and Prognosis:  Patient is realistic regarding therapy needs. No questions/concerns about plan or treatment.    Emotional Assessment Appearance:  Appears stated age Attitude/Demeanor/Rapport:    Affect (typically observed):  Accepting, Pleasant, Appropriate (Patient has dementia but was oriented when CSW spoke w/him.) Orientation:  Oriented to Self, Oriented to Place, Oriented to Situation, Oriented to  Time Alcohol / Substance use:  Not Applicable Psych involvement (Current and /or in the community):  No (Comment)  Discharge Needs  Concerns to be addressed:  Care Coordination Readmission within the last 30 days:  No Current discharge risk:  None Barriers to Discharge:  Continued Medical Work up   [employer], LCSW 05/28/2015, 11:51 AM

## 2015-05-28 NOTE — Progress Notes (Signed)
PROGRESS NOTE  Cameron [name] [id number] DOB: 08-30-1925 DOA: 05/27/2015 PCP: Kandice Hams, MD  Brief History 81 year old male with a history of pulmonary fibrosis, nonischemic cardiomyopathy with EF 30%, cognitive impairment, atrial fibrillation and hypothyroidism presented with 3-4 day history of shortness breath, coughing with yellow sputum production, and increasing lethargy with decreased oral intake. The patient went to see his primary care provider on the day of admission, 05/27/2015. Chest x-ray was negative in the office, but when the patient went home, he was unable to get out of the car due to progressive weakness. As a result, the patient was transferred to the emergency department. The patient was found to have a temperature 102.50F with soft blood pressures.  Assessment/Plan: Influenza-like illness/acquired pneumonia -Check respiratory viral panel--pending -Influenza PCR--neg -Blood cultures 2 sets-neg to date -Urinalysis negative for pyuria -Repeat chest x-ray 05/28/2015 after fluid hydration--possible LUL infiltrate -Check lactic acid--2.2-->1.9 -Chek procalcitonin--<0.10 -Continue empiric ceftriaxone and azithromycin -Albuterol nebulizer every 6 hours -continue fluids judiciously @50  cc/hr as pt already received 2 L in ED Atrial fibrillation -CHADSVASc = 3 -Continue rivaroxaban -Rate controlled -Continue metoprolol succinate -Hold diltiazem due to soft blood pressure Nonischemic cardiomyopathy/chronic systolic CHF -[id number] EF 30-35% -Appears clinically compensated at this time -Continue metoprolol succinate -Hold furosemide for today and reassess the need to restart 05/29/2015 -Continue digoxin  -Check digoxin level--0.7 -daily weight Hypothyroidism  -Continue Synthroid  Dementia  -Continue Aricept  -Continue Zoloft  Deconditioning -PT evaluation--will likely need SNF  Family Communication:   Left message for Son  [phone]) Disposition Plan:   SNF 1-2 days       Procedures/Studies: Dg Chest 2 View  05/27/2015  CLINICAL DATA:  Cough, congestion and fever several days. EXAM: CHEST  2 VIEW COMPARISON:  07/13/2013 FINDINGS: Lungs are adequately inflated with mild bilateral peripheral interstitial disease unchanged. No focal consolidation or effusion. Mild stable cardiomegaly. Mild calcified plaque over the thoracic aorta. Mild degenerate change of the spine. IMPRESSION: No acute cardiopulmonary disease. Chronic stable interstitial disease compatible with history of pulmonary fibrosis. Electronically Signed   By: Marin Olp M.D.   On: 05/27/2015 14:10   Dg Chest Port 1 View  05/28/2015  CLINICAL DATA:  Shortness of breath.  Cough. EXAM: PORTABLE CHEST 1 VIEW COMPARISON:  05/27/2015. FINDINGS: Mediastinum and hilar structures normal. Cardiomegaly. Pulmonary vascularity is normal. Mild left upper lobe infiltrate cannot be excluded. Interstitial changes are noted consistent chronic interstitial lung disease. Bilateral pleural thickening noted consistent with scarring. No pneumothorax. IMPRESSION: 1. Stable cardiomegaly. 2. Mild left upper lobe infiltrate cannot be excluded. Diffuse pulmonary interstitial prominence consistent with chronic interstitial lung disease. Bilateral pleural thickening noted consistent with scarring. Electronically Signed   By: Marcello Moores  Register   On: 05/28/2015 07:59         Subjective: Patient denies fevers, chills, headache, chest pain, dyspnea, nausea, vomiting, diarrhea, abdominal pain, dysuria, hematuria.  He is breathing better   Objective: Filed Vitals:   05/28/15 0600 05/28/15 0804 05/28/15 0810 05/28/15 1010  BP: 120/52 104/62  135/44  Pulse: 76 62  78  Temp: 97.7 F (36.5 C)     TempSrc: Oral     Resp: 20     Height:      Weight:      SpO2: 99%  98%     Intake/Output Summary (Last 24 hours) at 05/28/15 1220 Last data filed at 05/28/15 0905   Gross per 24 hour  Intake  340 ml  Output    450 ml  Net   -110 ml   Weight change:  Exam:   General:  Pt is alert, follows commands appropriately, not in acute distress  HEENT: No icterus, No thrush, No neck mass, [place]/AT  Cardiovascular: RRR, S1/S2, no rubs, no gallops  Respiratory: Bilateral crackles, left greater than right. No wheezing.  Abdomen: Soft/+BS, non tender, non distended, no guarding; no hepatosplenomegaly  Extremities: No edema, No lymphangitis, No petechiae, No rashes, no synovitis; no cyanosis or clubbing  Data Reviewed: Basic Metabolic Panel:  Recent Labs Lab 05/27/15 1500 05/28/15 0612  NA 137 138  K 4.3 4.4  CL 104 107  CO2 25 22  GLUCOSE 130* 121*  BUN 16 10  CREATININE 1.26* 1.07  CALCIUM 9.8 8.5*   Liver Function Tests: No results for input(s): AST, ALT, ALKPHOS, BILITOT, PROT, ALBUMIN in the last 168 hours. No results for input(s): LIPASE, AMYLASE in the last 168 hours. No results for input(s): AMMONIA in the last 168 hours. CBC:  Recent Labs Lab 05/27/15 1500 05/28/15 0612  WBC 10.3 9.2  NEUTROABS 8.3*  --   HGB 13.7 11.1*  HCT 40.2 32.5*  MCV 100.8* 102.2*  PLT 178 138*   Cardiac Enzymes: No results for input(s): CKTOTAL, CKMB, CKMBINDEX, TROPONINI in the last 168 hours. BNP: Invalid input(s): POCBNP CBG: No results for input(s): GLUCAP in the last 168 hours.  No results found for this or any previous visit (from the past 240 hour(s)).   Scheduled Meds: . sodium chloride   Intravenous STAT  . albuterol  2.5 mg Nebulization Q6H  . azithromycin  500 mg Intravenous Q24H  . calcium-vitamin D  1 tablet Oral Q breakfast  . cefTRIAXone (ROCEPHIN)  IV  1 g Intravenous Q24H  . darifenacin  7.5 mg Oral Daily  . digoxin  0.125 mg Oral Daily  . donepezil  10 mg Oral QHS  . levothyroxine  75 mcg Oral QAC breakfast  . metoprolol succinate  25 mg Oral Daily  . pantoprazole  40 mg Oral Daily  . Rivaroxaban  15 mg Oral Q supper  .  sertraline  12.5 mg Oral Daily  . vitamin B-12  500 mcg Oral Daily   Continuous Infusions: . sodium chloride 0.9 % 1,000 mL with potassium chloride 20 mEq infusion       Shama Monfils, DO  [facility] Pager [phone]  If 7PM-7AM, please contact night-coverage [email or web address] Password [email or web address] 05/28/2015, 12:20 PM   LOS: 1 day

## 2015-05-28 NOTE — NC FL2 (Signed)
[place] LEVEL OF CARE SCREENING TOOL     IDENTIFICATION  Patient Name: Cameron [name] Birthdate: 1926/02/05 Sex: male Admission Date (Current Location): 05/27/2015  [facility] and [place] Number: Herbalist and Address:  [facility] [place], [place] [address] [place], [place] [address]      Provider Number: [id number]  Attending Physician Name and Address:  Orson Eva, MD  Relative Name and Phone Number:  [place], [place], [phone]    Current Level of Care: Hospital Recommended Level of Care: [place] Prior Approval Number:    Date Approved/Denied:   PASRR Number:   [id number]   Discharge Plan: SNF    Current Diagnoses: Patient Active Problem List   Diagnosis Date Noted  . Influenza-like illness 05/27/2015  . Atrial fibrillation ([place]) 08/23/2013  . CAP (community acquired pneumonia) 07/17/2013  . Dementia 07/12/2013  . Weakness generalized 07/12/2013  . Diarrhea 07/12/2013  . Protein-calorie malnutrition, severe ([place]) 03/30/2013  . PNA (pneumonia) 03/28/2013  . Fever 03/27/2013  . Hypotension 03/27/2013  . Sepsis ([place]) 03/26/2013  . FUO (fever of unknown origin) 03/26/2013  . NICM (nonischemic cardiomyopathy) EF 30% 03/26/2013  . Chronic diastolic HF (heart failure) ([place]) 03/26/2013  . Persistent atrial fibrillation ([place]) 10/04/2012  . Acute systolic heart failure ([place]) 10/04/2012  . ALLERGIC RHINITIS 03/15/2007  . BRONCHIECTASIS, W/O ACUTE EXACERBATION 03/15/2007  . PULMONARY FIBROSIS, POSTINFLAMMATORY 03/15/2007    Orientation ACTIVITIES/SOCIAL BLADDER RESPIRATION       Family supportive External catheter ([place]om Catheter) Normal  BEHAVIORAL SYMPTOMS/MOOD NEUROLOGICAL BOWEL NUTRITION STATUS      Continent  (See DC Summary)  PHYSICIAN VISITS COMMUNICATION OF NEEDS Height & Weight Skin    Verbally 5\' 9"  (175.3 cm) 150 lbs. Normal          AMBULATORY STATUS RESPIRATION    Assist extensive  Normal      Personal Care Assistance Level of Assistance  Bathing, Feeding, Dressing Bathing Assistance: Limited assistance Feeding assistance: Limited assistance Dressing Assistance: Maximum assistance      Functional Limitations Info                SPECIAL CARE FACTORS FREQUENCY  PT (By licensed PT)     PT Frequency: 5x/week             Additional Factors Info  Code Status, Allergies Code Status Info: Full Allergies Info: NKA           Current Medications (05/28/2015):  This is the current hospital active medication list Current Facility-Administered Medications  Medication Dose Route Frequency Provider Last Rate Last Dose  . 0.9 %  sodium chloride infusion   Intravenous STAT Daleen Bo, MD      . acetaminophen (TYLENOL) tablet 650 mg  650 mg Oral Q6H PRN Orson Eva, MD       Or  . acetaminophen (TYLENOL) suppository 650 mg  650 mg Rectal Q6H PRN Orson Eva, MD      . albuterol (PROVENTIL) (2.5 MG/3ML) 0.083% nebulizer solution 2.5 mg  2.5 mg Nebulization Q6H Orson Eva, MD   2.5 mg at 05/28/15 0801  . azithromycin (ZITHROMAX) 500 mg in dextrose 5 % 250 mL IVPB  500 mg Intravenous Q24H Orson Eva, MD      . calcium-vitamin D (OSCAL WITH D) 500-200 MG-UNIT per tablet 1 tablet  1 tablet Oral Q breakfast Orson Eva, MD   1 tablet at 05/28/15 [phone]  . cefTRIAXone (ROCEPHIN) 1 g in dextrose  5 % 50 mL IVPB  1 g Intravenous Q24H Orson Eva, MD      . darifenacin (ENABLEX) 24 hr tablet 7.5 mg  7.5 mg Oral Daily Shanon Brow Tat, MD   7.5 mg at 05/28/15 1116  . digoxin (LANOXIN) tablet 0.125 mg  0.125 mg Oral Daily Orson Eva, MD   0.125 mg at 05/28/15 1118  . donepezil (ARICEPT) tablet 10 mg  10 mg Oral QHS Orson Eva, MD      . levothyroxine (SYNTHROID, LEVOTHROID) tablet 75 mcg  75 mcg Oral QAC breakfast Orson Eva, MD   75 mcg at 05/28/15 0808  . metoprolol succinate (TOPROL-XL) 24 hr tablet 25 mg  25 mg Oral Daily Orson Eva, MD   25 mg at 05/28/15 1117  . ondansetron (ZOFRAN)  tablet 4 mg  4 mg Oral Q6H PRN Orson Eva, MD       Or  . ondansetron [facility]) injection 4 mg  4 mg Intravenous Q6H PRN Orson Eva, MD      . pantoprazole (PROTONIX) EC tablet 40 mg  40 mg Oral Daily Myrene Galas, RPH   40 mg at 05/28/15 1118  . Rivaroxaban (XARELTO) tablet 15 mg  15 mg Oral Q supper Orson Eva, MD      . sertraline (ZOLOFT) tablet 12.5 mg  12.5 mg Oral Daily Orson Eva, MD   12.5 mg at 05/28/15 1117  . sodium chloride 0.9 % 1,000 mL with potassium chloride 20 mEq infusion   Intravenous Continuous Orson Eva, MD      . vitamin B-12 (CYANOCOBALAMIN) tablet 500 mcg  500 mcg Oral Daily Orson Eva, MD   500 mcg at 05/28/15 1117     Discharge Medications: Please see discharge summary for a list of discharge medications.  Relevant Imaging Results:  Relevant Lab Results:  Recent Labs    Additional [place], LCSW

## 2015-05-28 NOTE — Evaluation (Signed)
Physical Therapy Evaluation Patient Details Name: Cameron [name] MRN: [id number] DOB: Dec 07, 1925 Today's Date: 05/28/2015   History of Present Illness  Pt adm with Influenza-like illness/acquired pneumonia. PMH - dementia, CHF, afib  Clinical Impression  Pt admitted with above diagnosis and presents to PT with functional limitations due to deficits listed below (See PT problem list). Pt needs skilled PT to maximize independence and safety to allow discharge to ST-SNF since wife is unable to provide physical assistance at home and pt fall risk.      Follow Up Recommendations SNF    Equipment Recommendations  None recommended by PT    Recommendations for Other Services       Precautions / Restrictions Precautions Precautions: Fall Restrictions Weight Bearing Restrictions: No      Mobility  Bed Mobility Overal bed mobility: Needs Assistance Bed Mobility: Supine to Sit     Supine to sit: Min assist     General bed mobility comments: Assist to bring hips to EOB  Transfers Overall transfer level: Needs assistance Equipment used: None Transfers: Sit to/from Stand Sit to Stand: Min assist         General transfer comment: Assist for balance  Ambulation/Gait Ambulation/Gait assistance: Min assist Ambulation Distance (Feet): 250 Feet Assistive device: 1 person hand held assist;None Gait Pattern/deviations: Step-through pattern;Decreased stride length;Drifts right/left   Gait velocity interpretation: <1.8 ft/sec, indicative of risk for recurrent falls General Gait Details: Assist for balance  Stairs            Wheelchair Mobility    Modified Rankin (Stroke Patients Only)       Balance Overall balance assessment: Needs assistance Sitting-balance support: No upper extremity supported;Feet supported Sitting balance-Leahy Scale: Good     Standing balance support: No upper extremity supported;During functional activity Standing balance-Leahy Scale:  Fair                               Pertinent Vitals/Pain      Home Living Family/patient expects to be discharged to:: Private residence Living Arrangements: Spouse/significant other Available Help at Discharge:  (spouse unable to assist pt) Type of Home: Apartment Home Access: Stairs to enter Entrance Stairs-Rails: Psychiatric nurse of Steps: 3 Home Layout: One level Home Equipment: Cane - single point Additional Comments: from old medical record    Prior Function Level of Independence: Needs assistance   Gait / Transfers Assistance Needed: Amb without assistive device per pt           Hand Dominance   Dominant Hand: Right    Extremity/Trunk Assessment   Upper Extremity Assessment: Overall WFL for tasks assessed           Lower Extremity Assessment: Generalized weakness         Communication   Communication: HOH  Cognition Arousal/Alertness: Awake/alert Behavior During Therapy: WFL for tasks assessed/performed Overall Cognitive Status: No family/caregiver present to determine baseline cognitive functioning                 General Comments: Pt with dementia at baseline    General Comments      Exercises        Assessment/Plan    PT Assessment Patient needs continued PT services  PT Diagnosis Difficulty walking;Generalized weakness   PT Problem List Decreased strength;Decreased balance;Decreased mobility  PT Treatment Interventions DME instruction;Gait training;Functional mobility training;Therapeutic activities;Therapeutic exercise;Balance training;Patient/family education   PT Goals (Current goals can be  found in the Care Plan section) Acute Rehab PT Goals Patient Stated Goal: Not stated PT Goal Formulation: With patient Time For Goal Achievement: 06/04/15 Potential to Achieve Goals: Good    Frequency Min 3X/week   Barriers to discharge        Co-evaluation               End of Session  Equipment Utilized During Treatment: Gait belt Activity Tolerance: Patient tolerated treatment well Patient left: in chair;with call bell/phone within reach;with chair alarm set Nurse Communication: Mobility status         Time: [id number] PT Time Calculation (min) (ACUTE ONLY): 17 min   Charges:   PT Evaluation $Initial PT Evaluation Tier I: 1 Procedure     PT G Codes:        Daleena Rotter 06-02-2015, 1:41 PM [facility] PT [phone]

## 2015-05-29 DIAGNOSIS — F0390 Unspecified dementia without behavioral disturbance: Secondary | ICD-10-CM

## 2015-05-29 DIAGNOSIS — R5381 Other malaise: Secondary | ICD-10-CM

## 2015-05-29 DIAGNOSIS — I5032 Chronic diastolic (congestive) heart failure: Secondary | ICD-10-CM | POA: Insufficient documentation

## 2015-05-29 DIAGNOSIS — J189 Pneumonia, unspecified organism: Principal | ICD-10-CM

## 2015-05-29 DIAGNOSIS — I482 Chronic atrial fibrillation: Secondary | ICD-10-CM

## 2015-05-29 LAB — BASIC METABOLIC PANEL
Anion gap: 9 (ref 5–15)
BUN: 9 mg/dL (ref 6–20)
CHLORIDE: 106 mmol/L (ref 101–111)
CO2: 22 mmol/L (ref 22–32)
CREATININE: 0.89 mg/dL (ref 0.61–1.24)
Calcium: 8.4 mg/dL — ABNORMAL LOW (ref 8.9–10.3)
GFR calc Af Amer: >60 mL/min (ref >60)
GFR calc non Af Amer: >60 mL/min (ref >60)
GLUCOSE: 116 mg/dL — AB (ref 65–99)
POTASSIUM: 3.8 mmol/L (ref 3.5–5.1)
SODIUM: 137 mmol/L (ref 135–145)

## 2015-05-29 LAB — CBC
HEMATOCRIT: 32.4 % — AB (ref 39.0–52.0)
Hemoglobin: 10.9 g/dL — ABNORMAL LOW (ref 13.0–17.0)
MCH: 34 pg (ref 26.0–34.0)
MCHC: 33.6 g/dL (ref 30.0–36.0)
MCV: 100.9 fL — AB (ref 78.0–100.0)
PLATELETS: 156 10*3/uL (ref 150–400)
RBC: 3.21 MIL/uL — ABNORMAL LOW (ref 4.22–5.81)
RDW: 13.3 % (ref 11.5–15.5)
WBC: 8.1 10*3/uL (ref 4.0–10.5)

## 2015-05-29 LAB — PROCALCITONIN: Procalcitonin: <0.1 ng/mL

## 2015-05-29 MED ORDER — ALBUTEROL SULFATE (2.5 MG/3ML) 0.083% IN NEBU: 2.5000 mg | INHALATION_SOLUTION | RESPIRATORY_TRACT | Status: DC | PRN

## 2015-05-29 MED ORDER — ALBUTEROL SULFATE (2.5 MG/3ML) 0.083% IN NEBU
2.5000 mg | INHALATION_SOLUTION | Freq: Three times a day (TID) | RESPIRATORY_TRACT | Status: DC
  Administered 2015-05-29: 2.5 mg via RESPIRATORY_TRACT
  Filled 2015-05-29: qty 3

## 2015-05-29 MED ORDER — FUROSEMIDE 20 MG PO TABS: 20.0000 mg | ORAL_TABLET | Freq: Every day | ORAL | Status: DC

## 2015-05-29 MED ORDER — AMOXICILLIN-POT CLAVULANATE 875-125 MG PO TABS: 1.0000 | ORAL_TABLET | Freq: Two times a day (BID) | ORAL | Status: AC

## 2015-05-29 MED ORDER — ALBUTEROL SULFATE (2.5 MG/3ML) 0.083% IN NEBU
2.5000 mg | INHALATION_SOLUTION | Freq: Four times a day (QID) | RESPIRATORY_TRACT | Status: DC
  Administered 2015-05-29: 2.5 mg via RESPIRATORY_TRACT
  Filled 2015-05-29: qty 3

## 2015-05-29 NOTE — Progress Notes (Signed)
After extensive discussion between medical team, patient, and family plan is currently for patient to return home with home health services.  CSW signing off at this time  Domenica Reamer, [place] Social Worker [phone]

## 2015-05-29 NOTE — Care Management Note (Addendum)
Case Management Note  Patient Details  Name: [name] MRN: [id number] Date of Birth: [birth year]  Subjective/Objective:                 Patient admitted from home with wife for CAP. Febrile on admission, received IV abx.   Action/Plan:  Family discussing HH vs. SNF with MD.  Family agreeable to [facility] if patient is ambulating better. PT re-evaluated patient and per PT, patient ambulating better today, and discharge to home with [facility] is appropriate. Referral made to [facility] for [facility] services.  Expected Discharge Date:                  Expected Discharge Plan:  Skilled Nursing Facility  In-House Referral:  Clinical Social Work  Discharge planning Services  CM Consult  Post Acute Care Choice:    Choice offered to:  Patient, [facility] POA / Guardian  DME Arranged:    DME Agency:     HH Arranged:    [place] Agency:     Status of Service:  In process, will continue to follow  Medicare Important Message Given:    Date Medicare IM Given:    Medicare IM give by:    Date Additional Medicare IM Given:    Additional Medicare Important Message give by:     If discussed at [place] of Stay Meetings, dates discussed:    Additional Comments:  Carles Collet, RN 05/29/2015, 12:02 PM

## 2015-05-29 NOTE — Discharge Summary (Signed)
Physician Discharge Summary  Cameron [name] [id number] DOB: [birth year] DOA: 05/27/2015  PCP: Cameron Hams, MD  Admit date: 05/27/2015 Discharge date: 05/29/2015  Time spent: 35 minutes  Recommendations for Outpatient Follow-up:  Repeat CBC to follow electrolytes and renal function Repeat CXR in 4-6 weeks to follow resolution of filtrates  Check BMET to follow renal function  Reassess BP and adjust antihypertensive regimen as needed    Discharge Diagnoses:  CAP (community acquired pneumonia) PULMONARY FIBROSIS, POSTINFLAMMATORY NICM (nonischemic cardiomyopathy) EF 30% Fever Dementia Atrial fibrillation (Cameron [name]) Influenza-like illness   Discharge Condition: stable and improved. Patient with minimal assistance needed and intermittent supervision according to PT evaluation. On my exam he has competency, even poor insight into how much assistance he really needs and how much help his wife/family can provide at home (due to his underlying dementia). Patient was discharge with Cameron [name] services including PT, OT, Cameron [name] and SW.   Diet recommendation: heart healthy diet   Filed Weights   05/27/15 1420 05/27/15 1804  Weight: 68.04 kg (150 lb) 68.04 kg (150 lb)    History of present illness:  81 year old male with a history of dementia, nonischemic cardiomyopathy with EF 30-35%, pulmonary fibrosis, chronic atrial fibrillation, hypothyroidism presents with three-day history of cough and shortness of breath. Unfortunately, the patient is a poor historian secondary to his dementia. Much of this history is obtained from review of medical record and partly from speaking with the patient's wife and daughter in law Cameron [name]). Unfortunately, the wife also is a poor historian. Apparently, the patient began "feeling sick" on 05/23/2015 with symptoms of lethargy and malaise. On the subsequent day, the patient was more somnolent and "tired". He had been having some shortness of breath and coughing  with yellow-green sputum. The patient complains of some "aches and pains and just feeling tired" . He has had some dyspnea on exertion without any chest discomfort. He has had poor oral intake for the past week. The patient currently denies any headache, abdominal pain, nausea, vomiting, diarrhea, dysuria, hematuria. There is no neck pain or unusual rashes. The patient went to see his primary care provider on the morning of 05/27/2015. A chest x-ray was obtained in the office which was negative. After the patient went back home, he was so weak he was unable to get out of his car. His caregiver had to lower leg drag the patient into the house. As result, the patient was brought to the [name] for further evaluation.  [name] Course:  Influenza-like illness/acquired pneumonia -Influenza PCR--neg -Blood cultures 2 sets-neg to date -Urinalysis negative for pyuria -Repeat chest x-ray 05/28/2015 after fluid hydration--positive for LUL infiltrate -elevated lactic acid on admission back to normal at discharge -Cheked procalcitonin--<0.10 -after 3 days of IV antibiotics and excellent response to supportive care; patient was transitioned to PO regimen and the plan is to treat for another 6 days after discharge. -will continue PRN albuterol and outpatient follow up with PCP -no fever, WBC's WNL and no acute distress or toxic appearance at discharge  Atrial fibrillation -CHADSVASc = 3 -Continue rivaroxaban -Rate controlled; Continue metoprolol succinate, digoxin and cardizem   Nonischemic cardiomyopathy/chronic systolic CHF -[id number] EF 30-35% -Appears clinically compensated at this time -Continue metoprolol succinate -lasix to be resume on 06/03/15 -Continue digoxin  -Checked digoxin level--0.7 -daily weights and low sodium diet encourage  Hypothyroidism  -Continue Synthroid   Dementia  -Continue Aricept  -Continue Zoloft  -patient slowly progressing and coursing denial phase  currently   Deconditioning -PT  evaluation- demonstrated minimal physical assistance needed and just intermittent supervision -patient refuses SNF and would be a high risk of developing acute health care associated delirium due to his underlying dementia, with environmental changes   Procedures: See below for x-ray reports   Consultations: None   Discharge Exam: Filed Vitals:   05/29/15 0844 05/29/15 1330  BP: 133/48 125/68  Pulse: 82 70  Temp: 98.6 F (37 C) 97.9 F (36.6 C)  Resp: 18 18    General: afebrile, oriented X 3 and able to answer competency questions appropriately. Reports breathing and strength are improved as well. Patient decline SNF and Cameron [name] services and after discussing with him and make him understand needs, he agree with Cameron [name] services.  Cardiovascular: irregular, rate controlled. No gallops or rubs Respiratory:scattered rhonchi, no wheezing and improved air movement  Abd: soft, NT, ND, positive BS Neuro: no focal weakness. No nystagmus. Patient overall with poor insight, but oriented X 3 and able to speak in full sentences. Appropriate answering to competencies.   Discharge Instructions   Discharge Instructions    Diet - low sodium heart healthy    Complete by:  As directed      Discharge instructions    Complete by:  As directed   Follow heart healthy diet Take medications as prescribed Please follow up with PCP in 10 days Hold lasix until Monday 06/03/15 Maintain adequate hydration          Current Discharge Medication List    START taking these medications   Details  amoxicillin-clavulanate (AUGMENTIN) 875-125 MG tablet Take 1 tablet by mouth 2 (two) times daily. Qty: 12 tablet, Refills: 0      CONTINUE these medications which have CHANGED   Details  furosemide (LASIX) 20 MG tablet Take 1 tablet (20 mg total) by mouth daily.      CONTINUE these medications which have NOT CHANGED   Details  albuterol (PROVENTIL HFA;VENTOLIN HFA) 108 (90  BASE) MCG/ACT inhaler Inhale 1-2 puffs into the lungs every 6 (six) hours as needed for wheezing or shortness of breath.    calcium-vitamin D (OSCAL WITH D) 500-200 MG-UNIT tablet Take 1 tablet by mouth.    digoxin (LANOXIN) 0.125 MG tablet Take 0.125 mg by mouth daily.    diltiazem (CARDIZEM CD) 120 MG 24 hr capsule Take 120 mg by mouth daily.    donepezil (ARICEPT) 10 MG tablet Take 1 tablet (10 mg total) by mouth at bedtime. Qty: 30 tablet, Refills: 5    levothyroxine (SYNTHROID, LEVOTHROID) 75 MCG tablet ONE TABLET DAILY BEFORE BREAKFAST Qty: 30 tablet, Refills: 5    metoprolol succinate (TOPROL-XL) 25 MG 24 hr tablet Take 25 mg by mouth daily.    omeprazole (PRILOSEC OTC) 20 MG tablet Take 20 mg by mouth daily.    potassium chloride (K-DUR) 10 MEQ tablet Take 10 mEq by mouth daily.    Rivaroxaban (XARELTO) 15 MG TABS tablet Take 1 tablet (15 mg total) by mouth daily with supper. Qty: 90 tablet, Refills: 3    sertraline (ZOLOFT) 25 MG tablet Take 12.5 mg by mouth daily.     solifenacin (VESICARE) 5 MG tablet Take 5 mg by mouth daily.    vitamin B-12 (CYANOCOBALAMIN) 500 MCG tablet Take 500 mcg by mouth daily.       No Known Allergies Follow-up Information    Follow up with POLITE,RONALD D, MD. Schedule an appointment as soon as possible for a visit in 10 days.   Specialty:  Internal Medicine  Contact information:   [address] [place] [address] [phone]       The results of significant diagnostics from this hospitalization (including imaging, microbiology, ancillary and laboratory) are listed below for reference.    Significant Diagnostic Studies: Dg Chest 2 View  05/27/2015  CLINICAL DATA:  Cough, congestion and fever several days. EXAM: CHEST  2 VIEW COMPARISON:  07/13/2013 FINDINGS: Lungs are adequately inflated with mild bilateral peripheral interstitial disease unchanged. No focal consolidation or effusion. Mild stable cardiomegaly.  Mild calcified plaque over the thoracic aorta. Mild degenerate change of the spine. IMPRESSION: No acute cardiopulmonary disease. Chronic stable interstitial disease compatible with history of pulmonary fibrosis. Electronically Signed   By: Marin Olp M.D.   On: 05/27/2015 14:10   Dg Chest Port 1 View  05/28/2015  CLINICAL DATA:  Shortness of breath.  Cough. EXAM: PORTABLE CHEST 1 VIEW COMPARISON:  05/27/2015. FINDINGS: Mediastinum and hilar structures normal. Cardiomegaly. Pulmonary vascularity is normal. Mild left upper lobe infiltrate cannot be excluded. Interstitial changes are noted consistent chronic interstitial lung disease. Bilateral pleural thickening noted consistent with scarring. No pneumothorax. IMPRESSION: 1. Stable cardiomegaly. 2. Mild left upper lobe infiltrate cannot be excluded. Diffuse pulmonary interstitial prominence consistent with chronic interstitial lung disease. Bilateral pleural thickening noted consistent with scarring. Electronically Signed   By: Marcello Moores  Register   On: 05/28/2015 07:59    Microbiology: Recent Results (from the past 240 hour(s))  Blood culture (routine x 2)     Status: None (Preliminary result)   Collection Time: 05/27/15  5:11 PM  Result Value Ref Range Status   Specimen Description BLOOD RIGHT FOREARM  Final   Special Requests BOTTLES DRAWN AEROBIC AND ANAEROBIC 5ML  Final   Culture NO GROWTH 2 DAYS  Final   Report Status PENDING  Incomplete  Blood culture (routine x 2)     Status: None (Preliminary result)   Collection Time: 05/27/15  5:14 PM  Result Value Ref Range Status   Specimen Description BLOOD LEFT ARM  Final   Special Requests BOTTLES DRAWN AEROBIC AND ANAEROBIC 5ML  Final   Culture NO GROWTH 2 DAYS  Final   Report Status PENDING  Incomplete     Labs: Basic Metabolic Panel:  Recent Labs Lab 05/27/15 1500 05/28/15 0612 05/29/15 0529  NA 137 138 137  K 4.3 4.4 3.8  CL 104 107 106  CO2 25 22 22   GLUCOSE 130* 121* 116*   BUN 16 10 9   CREATININE 1.26* 1.07 0.89  CALCIUM 9.8 8.5* 8.4*   CBC:  Recent Labs Lab 05/27/15 1500 05/28/15 0612 05/29/15 0529  WBC 10.3 9.2 8.1  NEUTROABS 8.3*  --   --   HGB 13.7 11.1* 10.9*  HCT 40.2 32.5* 32.4*  MCV 100.8* 102.2* 100.9*  PLT 178 138* 156    Signed:  Barton Dubois  [facility] 05/29/2015, 3:26 PM

## 2015-05-29 NOTE — Progress Notes (Signed)
Cameron [name] discharged Home with H/H PT/OT/RN/SW per MD order.  Discharge instructions reviewed and discussed with the patient's son, Inocente Salles, all questions and concerns answered. Copy of instructions, care notes for new diagnosis & medications and scripts given to patient's son.    Medication List    TAKE these medications        albuterol 108 (90 BASE) MCG/ACT inhaler  Commonly known as:  PROVENTIL HFA;VENTOLIN HFA  Inhale 1-2 puffs into the lungs every 6 (six) hours as needed for wheezing or shortness of breath.     amoxicillin-clavulanate 875-125 MG tablet  Commonly known as:  AUGMENTIN  Take 1 tablet by mouth 2 (two) times daily.     calcium-vitamin D 500-200 MG-UNIT tablet  Commonly known as:  OSCAL WITH D  Take 1 tablet by mouth.     digoxin 0.125 MG tablet  Commonly known as:  LANOXIN  Take 0.125 mg by mouth daily.     diltiazem 120 MG 24 hr capsule  Commonly known as:  CARDIZEM CD  Take 120 mg by mouth daily.     donepezil 10 MG tablet  Commonly known as:  ARICEPT  Take 1 tablet (10 mg total) by mouth at bedtime.     furosemide 20 MG tablet  Commonly known as:  LASIX  Take 1 tablet (20 mg total) by mouth daily.  Start taking on:  06/03/2015     levothyroxine 75 MCG tablet  Commonly known as:  SYNTHROID, LEVOTHROID  ONE TABLET DAILY BEFORE BREAKFAST     metoprolol succinate 25 MG 24 hr tablet  Commonly known as:  TOPROL-XL  Take 25 mg by mouth daily.     omeprazole 20 MG tablet  Commonly known as:  PRILOSEC OTC  Take 20 mg by mouth daily.     potassium chloride 10 MEQ tablet  Commonly known as:  K-DUR  Take 10 mEq by mouth daily.     Rivaroxaban 15 MG Tabs tablet  Commonly known as:  XARELTO  Take 1 tablet (15 mg total) by mouth daily with supper.     sertraline 25 MG tablet  Commonly known as:  ZOLOFT  Take 12.5 mg by mouth daily.     solifenacin 5 MG tablet  Commonly known as:  VESICARE  Take 5 mg by mouth daily.     vitamin B-12 500 MCG tablet   Commonly known as:  CYANOCOBALAMIN  Take 500 mcg by mouth daily.        Patients skin is clean, dry and intact, no evidence of skin break down. IV site discontinued and catheter remains intact. Site without signs and symptoms of complications. Dressing and pressure applied.  Patient escorted to car by NT in a wheelchair,  no distress noted upon discharge.  Wynetta Emery, Azucena Dart C 05/29/2015 6:22 PM

## 2015-05-29 NOTE — Consult Note (Signed)
Cameron Cadenhead EdD 

## 2015-05-29 NOTE — Plan of Care (Signed)
Problem: Safety: Goal: Ability to remain free from injury will improve Outcome: Progressing Attempts to get up by hisself at times without calling for assistance. RN and nurse tech reinforce using the call bell and phone when needing to get up out of the bed.

## 2015-05-29 NOTE — Progress Notes (Signed)
Call received from pt. Son, [name] who stated he was coming to pick up his father this evening.  Wanted RN to go over d/c paperwork when he arrives.  Alphonzo Lemmings, RN

## 2015-05-29 NOTE — Progress Notes (Signed)
Physical Therapy Treatment Patient Details Name: Cameron [name] MRN: [id number] DOB: 01-09-1926 Today's Date: 05/29/2015    History of Present Illness Pt adm with Influenza-like illness/acquired pneumonia. PMH - dementia, CHF, afib    PT Comments    Pt progressing towards physical therapy goals. Pt was able to mobilize without an AD and negotiated stairs well. Feel this pt is safe to return home with supervision of family initially (24 hour not necessary). Discussed benefits of continued therapy at the home health level, and pt reluctant to accept. Will continue to follow and progress as able per POC.   Follow Up Recommendations  Home health PT;Supervision for mobility/OOB     Equipment Recommendations  None recommended by PT    Recommendations for Other Services       Precautions / Restrictions Precautions Precautions: Fall Restrictions Weight Bearing Restrictions: No    Mobility  Bed Mobility               General bed mobility comments: Pt sitting up in recliner upon PT arrival.   Transfers Overall transfer level: Needs assistance Equipment used: None Transfers: Sit to/from Stand Sit to Stand: Supervision         General transfer comment: Close supervision for safety.   Ambulation/Gait Ambulation/Gait assistance: Supervision;Min guard Ambulation Distance (Feet): 300 Feet Assistive device: None Gait Pattern/deviations: Step-through pattern;Decreased stride length;Drifts right/left Gait velocity: Decreased Gait velocity interpretation: Below normal speed for age/gender General Gait Details: Pt grossly ambulating without an AD or assist from therapist. Occasionally pt appeared slightly unsteady and therapist provided close guard, but pt was able to recover independently.    Stairs Stairs: Yes Stairs assistance: Min guard Stair Management: One rail Right;Alternating pattern;Forwards Number of Stairs: 5 General stair comments: Close guard for safety,  but hands-on assist was not required. Pt was able to negotiate the stairs without any noted unsteadiness.   Wheelchair Mobility    Modified Rankin (Stroke Patients Only)       Balance Overall balance assessment: Needs assistance Sitting-balance support: Feet supported;No upper extremity supported Sitting balance-Leahy Scale: Good     Standing balance support: No upper extremity supported;During functional activity Standing balance-Leahy Scale: Fair                      Cognition Arousal/Alertness: Awake/alert Behavior During Therapy: WFL for tasks assessed/performed Overall Cognitive Status: No family/caregiver present to determine baseline cognitive functioning                 General Comments: Pt with dementia at baseline    Exercises      General Comments        Pertinent Vitals/Pain Pain Assessment: No/denies pain    Home Living                      Prior Function            PT Goals (current goals can now be found in the care plan section) Acute Rehab PT Goals Patient Stated Goal: Not stated PT Goal Formulation: With patient Time For Goal Achievement: 06/04/15 Potential to Achieve Goals: Good Progress towards PT goals: Progressing toward goals    Frequency  Min 3X/week    PT Plan Discharge plan needs to be updated    Co-evaluation             End of Session Equipment Utilized During Treatment: Gait belt Activity Tolerance: Patient tolerated treatment well Patient left: in chair;with  call bell/phone within reach;with chair alarm set     Time: [phone] PT Time Calculation (min) (ACUTE ONLY): 22 min  Charges:  $Gait Training: 8-22 mins                    G Codes:      Rolinda Roan 06-23-15, 2:59 PM

## 2015-05-29 NOTE — Progress Notes (Signed)
Pt. And RN attempted to get in touch with family for him to go home. Left message on wife cell phone and at son's home.  Pt. Got in touch with wife and when RN returned call to wife, she stated,  "she can't take care of him and wanted to speak with the doctor.   RN text paged MD with number to contact pt. Wife.  Will continue follow and assist with d/c planning.  Alphonzo Lemmings, RN

## 2015-05-30 ENCOUNTER — Other Ambulatory Visit: Payer: Self-pay

## 2015-05-30 DIAGNOSIS — J189 Pneumonia, unspecified organism: Secondary | ICD-10-CM

## 2015-05-30 LAB — RESPIRATORY VIRUS PANEL
ADENOVIRUS: NEGATIVE
Influenza A: NEGATIVE
Influenza B: NEGATIVE
METAPNEUMOVIRUS: NEGATIVE
PARAINFLUENZA 2 A: NEGATIVE
Parainfluenza 1: NEGATIVE
Parainfluenza 3: NEGATIVE
RESPIRATORY SYNCYTIAL VIRUS B: NEGATIVE
RHINOVIRUS: POSITIVE — AB
Respiratory Syncytial Virus A: NEGATIVE

## 2015-05-31 ENCOUNTER — Other Ambulatory Visit: Payer: Self-pay

## 2015-05-31 ENCOUNTER — Other Ambulatory Visit: Payer: Self-pay | Admitting: *Deleted

## 2015-05-31 NOTE — Patient Outreach (Signed)
Received telephone call from Echo at [facility] to request [place]. She informed me that this pt is Dr. Linus Orn Romas's father-in-law. He discharged yesterday to home as he refused rehab, admitting diagnosis was CAP.  He is unable to stay at home by himself and this is a difficult issue.  In reviewing the chart I see my co-worker, Erenest Rasher, RN, Care Manager is assigned to him. I have called and left her a message and will call our main office to see which social worker would be assigned to this area and give them a call.  I told Kennyth Lose I would call her by the end of the day to give her an update.  Deloria Lair [facility] [phone]

## 2015-05-31 NOTE — Progress Notes (Signed)
CSW received a call from [place] who stated that patient has changed his mind now that he is home and is agreeable to SNF placement.  She requested that this CSW complete and Fl2 and arrange for placement.  CSW stated that since he is now at home with [facility] that their SW would need to be contacted; however, per the [facility] nurse- they have not "admitted" patient and will decline to do so since he now wants SNF placement. CSW received a call from patient's son Cameron [name] and spoke to patient (also named Cameron [name]) via telephone.  He provided verbal permission for information to be provided to [facility] (facility of choice).  During hospital stay- patient had given unit CSW permission to send out his initial SNF referral and thus bed offers were in place.  With his permission- CSW sent d/c summary and Fl2 to facility.  CSW received call from [place] at [facility]. She will admit the patient today.  CSW signing off. Lorie Phenix. Pauline Good, [place]

## 2015-05-31 NOTE — Patient Outreach (Addendum)
  This RN CM met with patient and his wife during this initial home visit to assess his needs for community care coordination.  Patient reports having been discharged from hospital on yesterday, November 30 after a diagnosis of pneumonia.  Patient and this RNCM collaborated for create a care plan to address his care management needs. Patient expressed he and his wife are having a hard time affording their medications.    Patient and wife are both members of [employer]. Patient and wife live in a 2 bed room spacious apartment. Patient is currently receiving services from [facility] RN/PT, patient refused inpatient or SNF placement post acute care.  Patient stated he wanted to be home.  Patients wife stated she wanted him home.  Patient and his wife reports having very good support in the home.  They report privately paying a former pharmaceutical representative to fill their medication box weekly.  This privately hired person is reported to take them on trips and to run errands, purchase groceries.    Patient reports he likes to go to every page football game and goes to the center for swimming twice weekly, however, he understands he must recuperate before he heads to another game.  Patient agreed to [facility] and benefits.  Referral made to [facility] to assist with lower cost medication options, LCSW has consulted with patient's wife, provided them with information on local assisted living as they have indicated that is the direction they plan to go in the beginning of the year   Call made to primary care physician office, Dr. Lavone Orn, to update Nurse Kennyth Lose regarding [facility] involvement. Message left for Kennyth Lose to call this RNCM if further assistance is needed after reading faxed information which includes, Care Plan, Fall/Depression Screen, Medication List and Barrier/Involvement letter.    Plan: Transition of care call week #2 on December 8.

## 2015-06-01 LAB — CULTURE, BLOOD (ROUTINE X 2)
CULTURE: NO GROWTH
Culture: NO GROWTH

## 2015-06-06 ENCOUNTER — Other Ambulatory Visit: Payer: Self-pay

## 2015-06-06 NOTE — Patient Outreach (Signed)
   Arrived at patient's home to be greeted by wife.  Wife informs this RNCM patient was admitted To [facility] on Tuesday, December 6 for short term rehabilitation.  Call made to [facility], spoke with Kelli Hope, [facility] who states the plan is to discharge patient on 06/07/2015. Patient's wife stated he is doing much better, however, she does not feel she can care for him  At home. This RNCM explained to wife patient has to agree to move to assisted living before any plans can be made.  This RNCM asked patient to give her son, [name]. Her telephone number so we can all meet and discuss plan. Wife stated she would rather give her son my number rather than have me call him.  Plan: Transition of care call on Monday 12/12 Request information on whether son has been contacted with this RNCM's contact information.

## 2015-06-10 ENCOUNTER — Other Ambulatory Visit: Payer: Self-pay

## 2015-06-10 NOTE — Patient Outreach (Signed)
This RNCM was able to make contact with patient for transition of care call week #1. Patient was admitted to [facility] on December 1, discharged on December 9 for short term rehabilitation.    Patient and this RNCM reviewed his care management care plan. Patient indicated he and his wife are in need of moving to assisted living next year, need assistance in finding one in their price range.  Care plan updated to reflect patient's most pressing need.  This RNCM called patient's primary care physician for post discharge appointment. Patient has appointment with Dr. Laurann Montana for  12/16.   Plan: Home visit on 12/20 for community care coordination assessment of needs.

## 2015-06-13 ENCOUNTER — Ambulatory Visit: Payer: Medicare Other

## 2015-06-17 ENCOUNTER — Other Ambulatory Visit: Payer: Self-pay

## 2015-06-17 NOTE — Patient Outreach (Signed)
  This RNCM was successful in making contact with patient for assessment of community care coordination. Patient identified himself by providing date of birth and address. Patient states he has been doing fine, was at his exercise program when I called earlier. Patient denies symptoms of COPD exacerbation as he denies increased shortness of breath, cough or increased phylem production.  This RNCM and patient agrees to home visit on December 27.  Plan; Home visit on 12/27 on at 10 am for community care coordination.

## 2015-06-20 ENCOUNTER — Ambulatory Visit: Payer: Medicare Other

## 2015-06-25 ENCOUNTER — Ambulatory Visit: Payer: Self-pay

## 2015-07-02 ENCOUNTER — Ambulatory Visit: Payer: Medicare Other

## 2015-07-15 ENCOUNTER — Other Ambulatory Visit: Payer: Self-pay

## 2015-07-15 NOTE — Patient Outreach (Signed)
Call made to follow up with patient's progress in selection of an assisted living. Call answered by Daine Floras, patient's private pay caregiver.   Susie stated patient and his wife are visiting different assisted living facilities.   Message left with susie with this RNCM's contact information.    Plan: Follow up with patient on 1/23

## 2015-07-30 ENCOUNTER — Other Ambulatory Visit: Payer: Self-pay

## 2015-11-20 ENCOUNTER — Encounter: Payer: Self-pay | Admitting: Interventional Cardiology

## 2015-11-26 ENCOUNTER — Telehealth: Payer: Self-pay | Admitting: Interventional Cardiology

## 2015-11-26 NOTE — Telephone Encounter (Signed)
Please advise about anticoagulaton

## 2015-11-26 NOTE — Telephone Encounter (Signed)
This is Dr.Varanasi's pt. Will route message to Dr.V and his nurse Jeani Hawking to f/u

## 2015-11-26 NOTE — Telephone Encounter (Signed)
New message    1. What dental office are you calling from? Dr. Candis Musa  2. What is your office phone and fax number?[phone] / [phone]  3. What type of procedure is the patient having performed? Extraction   4. What date is procedure scheduled? Pending   5. What is your question (ex. Antibiotics prior to procedure, holding medication-we need to know how long dentist wants pt to hold med)? xarelto how many days

## 2015-11-26 NOTE — Telephone Encounter (Signed)
**Note De-identified  Obfuscation** Please advise 

## 2015-11-27 NOTE — Telephone Encounter (Signed)
Called dentist office to confirm # of extractions. Pt is having 3 teeth pulled. Pt takes Xarelto for afib with CHADS2 score of 2 (CHF, age) and no history of stroke. Ok to hold Xarelto for 24 hours prior to dental work. Clearance faxed to dental office.

## 2015-12-30 NOTE — Telephone Encounter (Signed)
This encounter was created in error - please disregard.

## 2016-11-09 DIAGNOSIS — F0390 Unspecified dementia without behavioral disturbance: Secondary | ICD-10-CM | POA: Diagnosis not present

## 2016-11-09 DIAGNOSIS — L989 Disorder of the skin and subcutaneous tissue, unspecified: Secondary | ICD-10-CM | POA: Diagnosis not present

## 2016-11-09 DIAGNOSIS — E039 Hypothyroidism, unspecified: Secondary | ICD-10-CM | POA: Diagnosis not present

## 2016-11-09 DIAGNOSIS — R159 Full incontinence of feces: Secondary | ICD-10-CM | POA: Diagnosis not present

## 2016-11-09 DIAGNOSIS — I481 Persistent atrial fibrillation: Secondary | ICD-10-CM | POA: Diagnosis not present

## 2016-11-09 DIAGNOSIS — Z79899 Other long term (current) drug therapy: Secondary | ICD-10-CM | POA: Diagnosis not present

## 2016-11-09 DIAGNOSIS — M25552 Pain in left hip: Secondary | ICD-10-CM | POA: Diagnosis not present

## 2016-11-10 DIAGNOSIS — D225 Melanocytic nevi of trunk: Secondary | ICD-10-CM | POA: Diagnosis not present

## 2016-11-10 DIAGNOSIS — Z85828 Personal history of other malignant neoplasm of skin: Secondary | ICD-10-CM | POA: Diagnosis not present

## 2016-11-10 DIAGNOSIS — C44529 Squamous cell carcinoma of skin of other part of trunk: Secondary | ICD-10-CM | POA: Diagnosis not present

## 2016-11-10 DIAGNOSIS — D485 Neoplasm of uncertain behavior of skin: Secondary | ICD-10-CM | POA: Diagnosis not present

## 2016-11-10 DIAGNOSIS — L821 Other seborrheic keratosis: Secondary | ICD-10-CM | POA: Diagnosis not present

## 2016-12-02 DIAGNOSIS — L988 Other specified disorders of the skin and subcutaneous tissue: Secondary | ICD-10-CM | POA: Diagnosis not present

## 2016-12-02 DIAGNOSIS — C44529 Squamous cell carcinoma of skin of other part of trunk: Secondary | ICD-10-CM | POA: Diagnosis not present

## 2016-12-02 DIAGNOSIS — Z85828 Personal history of other malignant neoplasm of skin: Secondary | ICD-10-CM | POA: Diagnosis not present

## 2017-01-18 DIAGNOSIS — N3941 Urge incontinence: Secondary | ICD-10-CM | POA: Diagnosis not present

## 2017-01-18 DIAGNOSIS — N400 Enlarged prostate without lower urinary tract symptoms: Secondary | ICD-10-CM | POA: Diagnosis not present

## 2017-02-14 ENCOUNTER — Emergency Department (HOSPITAL_COMMUNITY): Payer: PPO

## 2017-02-14 ENCOUNTER — Encounter (HOSPITAL_COMMUNITY): Payer: Self-pay

## 2017-02-14 ENCOUNTER — Emergency Department (HOSPITAL_COMMUNITY)
Admission: EM | Admit: 2017-02-14 | Discharge: 2017-02-14 | Disposition: A | Discharge status: Home / Self Care | Payer: PPO | Attending: Emergency Medicine | Admitting: Emergency Medicine

## 2017-02-14 DIAGNOSIS — I959 Hypotension, unspecified: Secondary | ICD-10-CM | POA: Insufficient documentation

## 2017-02-14 DIAGNOSIS — Z79899 Other long term (current) drug therapy: Secondary | ICD-10-CM | POA: Insufficient documentation

## 2017-02-14 DIAGNOSIS — I5032 Chronic diastolic (congestive) heart failure: Secondary | ICD-10-CM | POA: Diagnosis not present

## 2017-02-14 DIAGNOSIS — F0390 Unspecified dementia without behavioral disturbance: Secondary | ICD-10-CM | POA: Diagnosis not present

## 2017-02-14 DIAGNOSIS — J449 Chronic obstructive pulmonary disease, unspecified: Secondary | ICD-10-CM | POA: Diagnosis not present

## 2017-02-14 DIAGNOSIS — M25552 Pain in left hip: Secondary | ICD-10-CM | POA: Diagnosis not present

## 2017-02-14 DIAGNOSIS — S79912A Unspecified injury of left hip, initial encounter: Secondary | ICD-10-CM | POA: Diagnosis not present

## 2017-02-14 DIAGNOSIS — M25559 Pain in unspecified hip: Secondary | ICD-10-CM | POA: Diagnosis not present

## 2017-02-14 DIAGNOSIS — Z87891 Personal history of nicotine dependence: Secondary | ICD-10-CM | POA: Diagnosis not present

## 2017-02-14 DIAGNOSIS — T148XXA Other injury of unspecified body region, initial encounter: Secondary | ICD-10-CM | POA: Diagnosis not present

## 2017-02-14 NOTE — ED Notes (Signed)
ED Provider at bedside. 

## 2017-02-14 NOTE — ED Provider Notes (Signed)
[facility] DEPT Provider Note   CSN: [id number] Arrival date & time: 02/14/17  1227   History   Chief Complaint Chief Complaint  Patient presents with  . Hip Pain  . Fall    HPI Cameron [name] is a 83 y.o. male.  HPI   83 year old male presents today with complaints of hip pain.  Patient notes a 6 month history of left hip pain.  Patient notes the symptoms are worse in the morning, improved after exercising.  Patient notes that he goes to exercise class twice a week and has no significant difficulty.  Patient does have dementia, but reports that he had a fall today.  Uncertain if he actually fell on the hip.  He reports he was able to get back to his feet on his own.  Wife notes that she was called from nursing staff reporting patient had an unwitnessed fall.  Patient denies any significant hip pain at rest, reports pain worse with ambulation.  He denies any dizziness, chest pain, or infectious etiology.  She denies any other drains from the fall.  Past Medical History:  Diagnosis Date  . Arthritis   . CHF (congestive heart failure) ([place])   . COPD (chronic obstructive pulmonary disease) ([place])   . Dysrhythmia    atrial fibrilation  . GERD (gastroesophageal reflux disease)     Patient Active Problem List   Diagnosis Date Noted  . Community acquired pneumonia   . Chronic diastolic heart failure ([place])   . Physical deconditioning   . Influenza-like illness 05/27/2015  . Atrial fibrillation ([place]) 08/23/2013  . CAP (community acquired pneumonia) 07/17/2013  . Dementia 07/12/2013  . Weakness generalized 07/12/2013  . Diarrhea 07/12/2013  . Protein-calorie malnutrition, severe ([place]) 03/30/2013  . PNA (pneumonia) 03/28/2013  . Fever 03/27/2013  . Hypotension 03/27/2013  . Sepsis ([place]) 03/26/2013  . FUO (fever of unknown origin) 03/26/2013  . NICM (nonischemic cardiomyopathy) EF 30% 03/26/2013  . Chronic diastolic HF (heart failure) ([place]) 03/26/2013  . Persistent atrial  fibrillation ([place]) 10/04/2012  . Acute systolic heart failure ([place]) 10/04/2012  . ALLERGIC RHINITIS 03/15/2007  . BRONCHIECTASIS, W/O ACUTE EXACERBATION 03/15/2007  . PULMONARY FIBROSIS, POSTINFLAMMATORY 03/15/2007    Past Surgical History:  Procedure Laterality Date  . hemmroidectmy    . HERNIA REPAIR    . LEFT HEART CATHETERIZATION WITH CORONARY ANGIOGRAM N/A 10/07/2012   Procedure: LEFT HEART CATHETERIZATION WITH CORONARY ANGIOGRAM, possible PCI;  Surgeon: Jettie Booze, MD;  Location: [facility] CATH LAB;  Service: Cardiovascular;  Laterality: N/A;  . TONSILLECTOMY         Home Medications    Prior to Admission medications   Medication Sig Start Date End Date Taking? Authorizing Provider  calcium-vitamin D (OSCAL WITH D) 500-200 MG-UNIT tablet Take 1 tablet by mouth.   Yes [provider]  digoxin (LANOXIN) 0.125 MG tablet Take 0.125 mg by mouth daily.   Yes [provider]  diltiazem (CARDIZEM CD) 120 MG 24 hr capsule Take 120 mg by mouth daily.   Yes [provider]  donepezil (ARICEPT) 10 MG tablet Take 1 tablet (10 mg total) by mouth at bedtime. Patient taking differently: Take 10 mg by mouth every morning.  10/09/12  Yes Norins, Heinz Knuckles, MD  furosemide (LASIX) 20 MG tablet Take 1 tablet (20 mg total) by mouth daily. 06/03/15  Yes Barton Dubois, MD  levothyroxine (SYNTHROID, LEVOTHROID) 75 MCG tablet ONE TABLET DAILY BEFORE BREAKFAST Patient taking differently: Take 77mcg by mouth once  DAILY BEFORE BREAKFAST   Yes Norins, Heinz Knuckles, MD  metoprolol succinate (TOPROL-XL) 25 MG 24 hr tablet Take 25 mg by mouth daily.   Yes [provider]  omeprazole (PRILOSEC OTC) 20 MG tablet Take 20 mg by mouth daily.   Yes [provider]  Potassium Chloride ER 20 MEQ TBCR Take 20 mEq by mouth every morning.    Yes [provider]  Rivaroxaban (XARELTO) 15 MG TABS tablet Take 1 tablet (15 mg total) by mouth daily with supper. Patient  taking differently: Take 15 mg by mouth every morning.  08/23/13  Yes Jettie Booze, MD  sertraline (ZOLOFT) 50 MG tablet Take 25 mg by mouth every morning.    Yes [provider]  solifenacin (VESICARE) 10 MG tablet Take 10 mg by mouth every morning.    Yes [provider]  vitamin B-12 (CYANOCOBALAMIN) 500 MCG tablet Take 500 mcg by mouth daily.   Yes [provider]  albuterol (PROVENTIL HFA;VENTOLIN HFA) 108 (90 BASE) MCG/ACT inhaler Inhale 1-2 puffs into the lungs every 6 (six) hours as needed for wheezing or shortness of breath.    [provider]    Family History Family History  Problem Relation Age of Onset  . Heart attack Mother   . Pulmonary embolism Father     Social History Social History  Substance Use Topics  . Smoking status: Former Smoker    Packs/day: 2.00    Years: 40.00    Types: Cigarettes    Quit date: 06/29/1961  . Smokeless tobacco: Never Used  . Alcohol use 1.2 oz/week    2 Glasses of wine per week     Comment: occasional     Allergies   Patient has no known allergies.   Review of Systems Review of Systems  All other systems reviewed and are negative.    Physical Exam Updated Vital Signs BP (!) 148/58 (BP Location: Right Arm)   Pulse (!) 55   Temp 97.9 F (36.6 C) (Oral)   Resp 16   Ht 5\' 10"  (1.778 m)   SpO2 96%   Physical Exam  Constitutional: He is oriented to person, place, and time. He appears well-developed and well-nourished.  HENT:  Head: Normocephalic and atraumatic.  Eyes: Pupils are equal, round, and reactive to light. Conjunctivae are normal. Right eye exhibits no discharge. Left eye exhibits no discharge. No scleral icterus.  Neck: Normal range of motion. No JVD present. No tracheal deviation present.  Pulmonary/Chest: Effort normal. No stridor.  Musculoskeletal:  No CT or L-spine tenderness palpation.  Hips stable bilateral nontender and atraumatic.  Distal sensation strength and  motor function intact.  Patient ambulates without significant difficulty or antalgic gait.  Neurological: He is alert and oriented to person, place, and time. Coordination normal.  Psychiatric: He has a normal mood and affect. His behavior is normal. Judgment and thought content normal.  Nursing note and vitals reviewed.    ED Treatments / Results  Labs (all labs ordered are listed, but only abnormal results are displayed) Labs Reviewed - No data to display  EKG  EKG Interpretation None       Radiology Dg Hip Unilat With Pelvis 2-3 Views Left  Result Date: 02/14/2017 CLINICAL DATA:  83 year old male with a history of left hip pain for months. Recent fall EXAM: DG HIP (WITH OR WITHOUT PELVIS) 2-3V LEFT COMPARISON:  None. FINDINGS: Bony pelvic ring intact with no acute fracture identified. Bilateral hips projects normally over  the acetabula. Unremarkable appearance of the proximal left femur. Mild degenerative changes of the lumbar spine. Vascular calcifications. Unremarkable appearance of the visualized bowel gas. IMPRESSION: Negative for acute bony abnormality. Vascular calcifications. Electronically Signed   By: Corrie Mckusick D.O.   On: 02/14/2017 13:00    Procedures Procedures (including critical care time)  Medications Ordered in ED Medications - No data to display   Initial Impression / Assessment and Plan / ED Course  I have reviewed the triage vital signs and the nursing notes.  Pertinent labs & imaging results that were available during my care of the patient were reviewed by me and considered in my medical decision making (see chart for details).     Final Clinical Impressions(s) / ED Diagnoses   Final diagnoses:  Pain of left hip joint    Imaging: DG hip unilateral left  Assessment/Plan: 83 year old male presents today status post fall.  Patient reports 6 month history of left hip pain.  He is in no acute distress, no signs of trauma.  Patient has negative  plain films.  He was ambulated without significant difficulty.  I have very low suspicion for occult fracture in this patient.  Patient has no systemic symptoms that indicate systemic cause of patient's fall.  I discussed patient care with his friend who is at bedside, I also spoke with his wife via phone.  Patient will be given a walker, he will follow-up with his primary care provider tomorrow, he will return immediately with any new or worsening signs or symptoms.    New Prescriptions New Prescriptions   No medications on file     Francee Gentile 02/14/17 1433    Charlesetta Shanks, MD 02/23/17 [phone]

## 2017-02-14 NOTE — Discharge Instructions (Signed)
Please read attached information. If you experience any new or worsening signs or symptoms please return to the emergency room for evaluation. Please follow-up with your primary care provider or specialist as discussed.  Please refrain from excessive activity until symptoms improve.

## 2017-02-14 NOTE — ED Triage Notes (Signed)
Pt from home with ems c.o chronic left hip pain, Pain is 1/10 at this time. Pt did fall this morning, pt states his leg gave out on him, no injury reported, no loc. Pt states the fall did not increase his hip pain, he "just want to get it checked out" Pt ambulatory upon arrival, alert and oriented, nad. VSS

## 2017-02-14 NOTE — ED Notes (Signed)
Pt received his walker.

## 2017-02-14 NOTE — ED Provider Notes (Signed)
Medical screening examination/treatment/procedure(s) were conducted as a shared visit with non-physician practitioner(s) and myself.  I personally evaluated the patient during the encounter.   EKG Interpretation None     Patient reports he does have some problems with left hip pain but it does not impede his activities. He reports he does so her sneakers and as he exercises it "loosens up" and feels better. Today he was stepping out of the elevator and his left leg without causing him to fall. He reports he was immediately able to get right back up and ambulate. On exam patient is a next condition. He is ambulating with minimal appearance of antalgic gait. Coordination is good. Supine position he does not have reproducible pain to palpation. He can deeply flex and extend as well as internally Rotate the hip without pain. Strength of lower extremity is excellent. I agree with plan of management.   Charlesetta Shanks, MD 02/14/17 1421

## 2017-02-14 NOTE — ED Notes (Signed)
Patient transported to X-ray 

## 2017-02-14 NOTE — Care Management Note (Signed)
Case Management Note  Patient Details  Name: Cameron [name] MRN: [id number] Date of Birth: 07/09/1925  Subjective/Objective:  83 y.o. S/p fall. CM received call that PA-C was going to order RW. CM notified DME rep.                   Action/Plan:CM will sign off for now but will be available should additional discharge needs arise or disposition change.    Expected Discharge Date:                  Expected Discharge Plan:     In-House Referral:     Discharge planning Services  CM Consult  Post Acute Care Choice:  Durable Medical Equipment Choice offered to:     DME Arranged:  Walker rolling DME Agency:  [place]:    [facility] Agency:     Status of Service:  Completed, signed off  If discussed at [place] of Stay Meetings, dates discussed:    Additional Comments:  Delrae Sawyers, RN 02/14/2017, 2:02 PM

## 2017-02-16 DIAGNOSIS — R6889 Other general symptoms and signs: Secondary | ICD-10-CM | POA: Diagnosis not present

## 2017-02-16 DIAGNOSIS — R159 Full incontinence of feces: Secondary | ICD-10-CM | POA: Diagnosis not present

## 2017-02-16 DIAGNOSIS — R197 Diarrhea, unspecified: Secondary | ICD-10-CM | POA: Diagnosis not present

## 2017-02-16 DIAGNOSIS — W19XXXA Unspecified fall, initial encounter: Secondary | ICD-10-CM | POA: Diagnosis not present

## 2017-02-24 DIAGNOSIS — H02105 Unspecified ectropion of left lower eyelid: Secondary | ICD-10-CM | POA: Diagnosis not present

## 2017-02-24 DIAGNOSIS — Z961 Presence of intraocular lens: Secondary | ICD-10-CM | POA: Diagnosis not present

## 2017-02-24 DIAGNOSIS — H02101 Unspecified ectropion of right upper eyelid: Secondary | ICD-10-CM | POA: Diagnosis not present

## 2017-02-24 DIAGNOSIS — H40013 Open angle with borderline findings, low risk, bilateral: Secondary | ICD-10-CM | POA: Diagnosis not present

## 2017-03-22 DIAGNOSIS — R350 Frequency of micturition: Secondary | ICD-10-CM | POA: Diagnosis not present

## 2017-03-22 DIAGNOSIS — R3911 Hesitancy of micturition: Secondary | ICD-10-CM | POA: Diagnosis not present

## 2017-03-22 DIAGNOSIS — R351 Nocturia: Secondary | ICD-10-CM | POA: Diagnosis not present

## 2017-03-25 ENCOUNTER — Other Ambulatory Visit: Payer: Self-pay | Admitting: Internal Medicine

## 2017-03-25 DIAGNOSIS — Z23 Encounter for immunization: Secondary | ICD-10-CM | POA: Diagnosis not present

## 2017-03-25 DIAGNOSIS — R197 Diarrhea, unspecified: Secondary | ICD-10-CM | POA: Diagnosis not present

## 2017-03-25 DIAGNOSIS — N400 Enlarged prostate without lower urinary tract symptoms: Secondary | ICD-10-CM | POA: Diagnosis not present

## 2017-03-25 DIAGNOSIS — R6889 Other general symptoms and signs: Secondary | ICD-10-CM

## 2017-03-25 DIAGNOSIS — F0390 Unspecified dementia without behavioral disturbance: Secondary | ICD-10-CM | POA: Diagnosis not present

## 2017-03-25 DIAGNOSIS — R159 Full incontinence of feces: Secondary | ICD-10-CM | POA: Diagnosis not present

## 2017-03-25 DIAGNOSIS — R269 Unspecified abnormalities of gait and mobility: Secondary | ICD-10-CM | POA: Diagnosis not present

## 2017-03-25 DIAGNOSIS — R404 Transient alteration of awareness: Secondary | ICD-10-CM | POA: Diagnosis not present

## 2017-04-01 DIAGNOSIS — R6889 Other general symptoms and signs: Secondary | ICD-10-CM | POA: Insufficient documentation

## 2017-04-05 DIAGNOSIS — Z111 Encounter for screening for respiratory tuberculosis: Secondary | ICD-10-CM | POA: Diagnosis not present

## 2017-04-07 ENCOUNTER — Ambulatory Visit
Admission: RE | Admit: 2017-04-07 | Discharge: 2017-04-07 | Disposition: A | Discharge status: Home / Self Care | Payer: PPO | Source: Ambulatory Visit | Attending: Internal Medicine | Admitting: Internal Medicine

## 2017-04-07 DIAGNOSIS — R6889 Other general symptoms and signs: Secondary | ICD-10-CM

## 2017-04-07 MED ORDER — GADOBENATE DIMEGLUMINE 529 MG/ML IV SOLN
14.0000 mL | Freq: Once | INTRAVENOUS | Status: AC | PRN
  Administered 2017-04-07: 14 mL via INTRAVENOUS

## 2017-04-14 ENCOUNTER — Ambulatory Visit (INDEPENDENT_AMBULATORY_CARE_PROVIDER_SITE_OTHER): Payer: PPO

## 2017-04-14 DIAGNOSIS — I481 Persistent atrial fibrillation: Secondary | ICD-10-CM

## 2017-04-14 DIAGNOSIS — R6889 Other general symptoms and signs: Secondary | ICD-10-CM

## 2017-04-14 DIAGNOSIS — I4819 Other persistent atrial fibrillation: Secondary | ICD-10-CM

## 2017-04-18 DIAGNOSIS — J449 Chronic obstructive pulmonary disease, unspecified: Secondary | ICD-10-CM | POA: Diagnosis not present

## 2017-04-18 DIAGNOSIS — Z7901 Long term (current) use of anticoagulants: Secondary | ICD-10-CM | POA: Diagnosis not present

## 2017-04-18 DIAGNOSIS — K219 Gastro-esophageal reflux disease without esophagitis: Secondary | ICD-10-CM | POA: Diagnosis not present

## 2017-04-18 DIAGNOSIS — Z9181 History of falling: Secondary | ICD-10-CM | POA: Diagnosis not present

## 2017-04-18 DIAGNOSIS — N400 Enlarged prostate without lower urinary tract symptoms: Secondary | ICD-10-CM | POA: Diagnosis not present

## 2017-04-18 DIAGNOSIS — F0280 Dementia in other diseases classified elsewhere without behavioral disturbance: Secondary | ICD-10-CM | POA: Diagnosis not present

## 2017-04-18 DIAGNOSIS — E039 Hypothyroidism, unspecified: Secondary | ICD-10-CM | POA: Diagnosis not present

## 2017-04-18 DIAGNOSIS — R2681 Unsteadiness on feet: Secondary | ICD-10-CM | POA: Diagnosis not present

## 2017-04-18 DIAGNOSIS — I4891 Unspecified atrial fibrillation: Secondary | ICD-10-CM | POA: Diagnosis not present

## 2017-04-19 DIAGNOSIS — R350 Frequency of micturition: Secondary | ICD-10-CM | POA: Diagnosis not present

## 2017-04-19 DIAGNOSIS — N139 Obstructive and reflux uropathy, unspecified: Secondary | ICD-10-CM | POA: Diagnosis not present

## 2017-04-19 DIAGNOSIS — R3914 Feeling of incomplete bladder emptying: Secondary | ICD-10-CM | POA: Diagnosis not present

## 2017-04-19 DIAGNOSIS — R3915 Urgency of urination: Secondary | ICD-10-CM | POA: Diagnosis not present

## 2017-04-21 DIAGNOSIS — I4891 Unspecified atrial fibrillation: Secondary | ICD-10-CM | POA: Diagnosis not present

## 2017-04-21 DIAGNOSIS — J449 Chronic obstructive pulmonary disease, unspecified: Secondary | ICD-10-CM | POA: Diagnosis not present

## 2017-04-21 DIAGNOSIS — Z7901 Long term (current) use of anticoagulants: Secondary | ICD-10-CM | POA: Diagnosis not present

## 2017-04-21 DIAGNOSIS — E039 Hypothyroidism, unspecified: Secondary | ICD-10-CM | POA: Diagnosis not present

## 2017-04-21 DIAGNOSIS — F0280 Dementia in other diseases classified elsewhere without behavioral disturbance: Secondary | ICD-10-CM | POA: Diagnosis not present

## 2017-04-21 DIAGNOSIS — R2681 Unsteadiness on feet: Secondary | ICD-10-CM | POA: Diagnosis not present

## 2017-04-21 DIAGNOSIS — K219 Gastro-esophageal reflux disease without esophagitis: Secondary | ICD-10-CM | POA: Diagnosis not present

## 2017-04-21 DIAGNOSIS — Z9181 History of falling: Secondary | ICD-10-CM | POA: Diagnosis not present

## 2017-04-21 DIAGNOSIS — N400 Enlarged prostate without lower urinary tract symptoms: Secondary | ICD-10-CM | POA: Diagnosis not present

## 2017-04-26 DIAGNOSIS — Z7901 Long term (current) use of anticoagulants: Secondary | ICD-10-CM | POA: Diagnosis not present

## 2017-04-26 DIAGNOSIS — K219 Gastro-esophageal reflux disease without esophagitis: Secondary | ICD-10-CM | POA: Diagnosis not present

## 2017-04-26 DIAGNOSIS — N400 Enlarged prostate without lower urinary tract symptoms: Secondary | ICD-10-CM | POA: Diagnosis not present

## 2017-04-26 DIAGNOSIS — J449 Chronic obstructive pulmonary disease, unspecified: Secondary | ICD-10-CM | POA: Diagnosis not present

## 2017-04-26 DIAGNOSIS — R2681 Unsteadiness on feet: Secondary | ICD-10-CM | POA: Diagnosis not present

## 2017-04-26 DIAGNOSIS — I4891 Unspecified atrial fibrillation: Secondary | ICD-10-CM | POA: Diagnosis not present

## 2017-04-26 DIAGNOSIS — E039 Hypothyroidism, unspecified: Secondary | ICD-10-CM | POA: Diagnosis not present

## 2017-04-26 DIAGNOSIS — F0280 Dementia in other diseases classified elsewhere without behavioral disturbance: Secondary | ICD-10-CM | POA: Diagnosis not present

## 2017-04-26 DIAGNOSIS — Z9181 History of falling: Secondary | ICD-10-CM | POA: Diagnosis not present

## 2017-04-28 ENCOUNTER — Telehealth: Payer: Self-pay | Admitting: Interventional Cardiology

## 2017-04-28 NOTE — Telephone Encounter (Signed)
Dr. Laurann Montana calling to make Dr. Irish Lack aware that the patient was having decreased LOC and presyncopal episodes so he ordered 48 hour holter. He states that he received the results back stating:   Minimum HR: 31 BPM at 1:33:30 PM Maximum HR: 93 BPM at 9:49:26 PM(2) Average HR: 62 BPM <1% PVCs, zero APCs Atrial fibrillation noted  Dr. Laurann Montana would like Dr. Irish Lack to review monitor results. He states that they normally get a more detailed report. His concern was minimum HR at 31. He states that he has faxed over his OV note (awaiting fax). Will forward to Dr. Irish Lack for review.

## 2017-04-28 NOTE — Telephone Encounter (Signed)
New Message      Dr Laurann Montana wanted to speak with Dr Irish Lack or nurse

## 2017-04-29 NOTE — Telephone Encounter (Signed)
Attempted to call the home number but there was no answer.

## 2017-04-29 NOTE — Telephone Encounter (Signed)
Called and left message to call back.

## 2017-04-29 NOTE — Telephone Encounter (Signed)
Stop metoprolol and digoxin.  He will need f/u of his lightheadedness and f/u ECG in a week.

## 2017-04-30 ENCOUNTER — Telehealth: Payer: Self-pay | Admitting: Interventional Cardiology

## 2017-04-30 NOTE — Telephone Encounter (Signed)
I spoke with Cameron [name]

## 2017-04-30 NOTE — Telephone Encounter (Signed)
I spoke with Greggory Keen at [facility] [phone], she is aware Dr Irish Lack recommended pt stop metoprolol and digoxin, follow-up appt for EKG scheduled with Dr Beau Fanny 05/06/17 at 1:40PM. I will fax this order to [phone], attn: [place].

## 2017-04-30 NOTE — Telephone Encounter (Signed)
I spoke with Cameron [name] with pt's verbal permission. She is aware that Dr Irish Lack recommended pt stop metoprolol and digoxin, follow-up appt scheduled with Dr Beau Fanny for 05/06/17 at 1:40 PM.

## 2017-04-30 NOTE — Telephone Encounter (Signed)
New Message ° ° pt daughter verbalized that she is returning call for rn  °

## 2017-05-04 DIAGNOSIS — K219 Gastro-esophageal reflux disease without esophagitis: Secondary | ICD-10-CM | POA: Diagnosis not present

## 2017-05-04 DIAGNOSIS — R636 Underweight: Secondary | ICD-10-CM | POA: Diagnosis not present

## 2017-05-04 DIAGNOSIS — F0280 Dementia in other diseases classified elsewhere without behavioral disturbance: Secondary | ICD-10-CM | POA: Diagnosis not present

## 2017-05-04 DIAGNOSIS — R2681 Unsteadiness on feet: Secondary | ICD-10-CM | POA: Diagnosis not present

## 2017-05-04 DIAGNOSIS — F0390 Unspecified dementia without behavioral disturbance: Secondary | ICD-10-CM | POA: Diagnosis not present

## 2017-05-04 DIAGNOSIS — I4891 Unspecified atrial fibrillation: Secondary | ICD-10-CM | POA: Diagnosis not present

## 2017-05-04 DIAGNOSIS — Z1389 Encounter for screening for other disorder: Secondary | ICD-10-CM | POA: Diagnosis not present

## 2017-05-04 DIAGNOSIS — J449 Chronic obstructive pulmonary disease, unspecified: Secondary | ICD-10-CM | POA: Diagnosis not present

## 2017-05-04 DIAGNOSIS — Z5181 Encounter for therapeutic drug level monitoring: Secondary | ICD-10-CM | POA: Diagnosis not present

## 2017-05-04 DIAGNOSIS — Z0000 Encounter for general adult medical examination without abnormal findings: Secondary | ICD-10-CM | POA: Diagnosis not present

## 2017-05-04 DIAGNOSIS — R159 Full incontinence of feces: Secondary | ICD-10-CM | POA: Diagnosis not present

## 2017-05-04 DIAGNOSIS — E039 Hypothyroidism, unspecified: Secondary | ICD-10-CM | POA: Diagnosis not present

## 2017-05-04 DIAGNOSIS — I481 Persistent atrial fibrillation: Secondary | ICD-10-CM | POA: Diagnosis not present

## 2017-05-04 DIAGNOSIS — N400 Enlarged prostate without lower urinary tract symptoms: Secondary | ICD-10-CM | POA: Diagnosis not present

## 2017-05-04 DIAGNOSIS — R269 Unspecified abnormalities of gait and mobility: Secondary | ICD-10-CM | POA: Diagnosis not present

## 2017-05-04 DIAGNOSIS — N401 Enlarged prostate with lower urinary tract symptoms: Secondary | ICD-10-CM | POA: Diagnosis not present

## 2017-05-04 DIAGNOSIS — Z9181 History of falling: Secondary | ICD-10-CM | POA: Diagnosis not present

## 2017-05-04 DIAGNOSIS — Z7901 Long term (current) use of anticoagulants: Secondary | ICD-10-CM | POA: Diagnosis not present

## 2017-05-06 ENCOUNTER — Ambulatory Visit: Payer: PPO | Admitting: Interventional Cardiology

## 2017-05-06 ENCOUNTER — Ambulatory Visit (INDEPENDENT_AMBULATORY_CARE_PROVIDER_SITE_OTHER): Payer: PPO | Admitting: Interventional Cardiology

## 2017-05-06 ENCOUNTER — Encounter: Payer: Self-pay | Admitting: Interventional Cardiology

## 2017-05-06 VITALS — BP 118/72 | HR 62 | Ht 70.0 in | Wt 158.6 lb

## 2017-05-06 DIAGNOSIS — R42 Dizziness and giddiness: Secondary | ICD-10-CM | POA: Diagnosis not present

## 2017-05-06 DIAGNOSIS — I5032 Chronic diastolic (congestive) heart failure: Secondary | ICD-10-CM | POA: Diagnosis not present

## 2017-05-06 DIAGNOSIS — I482 Chronic atrial fibrillation, unspecified: Secondary | ICD-10-CM

## 2017-05-06 DIAGNOSIS — Z7901 Long term (current) use of anticoagulants: Secondary | ICD-10-CM | POA: Diagnosis not present

## 2017-05-06 NOTE — Progress Notes (Signed)
Cardiology Office Note   Date:  05/06/2017   ID:  WYETH HOFFER, DOB [birth year], MRN [id number]  PCP:  Lavone Orn, MD    No chief complaint on file.  Bradycardia, lightheadedness, AFib  Wt Readings from Last 3 Encounters:  05/27/15 150 lb (68 kg)  08/23/13 145 lb (65.8 kg)  07/19/13 143 lb (64.9 kg)       History of Present Illness: Cameron [name] is a 83 y.o. male  who had a nonischemic cardiomyopathy diagnosed a few years ago. He was also diagnosed with AFib. His LV function improved in May 2014 echo.  Cath showed nonobstructive CAD.    He has dementia and now lives in an assisted living facility.  He was evaluated for lightheadedness episodes in 10/18.  Initially, Lasix was stopped as it was thought he may have been dehydrated.  He had a monitor showing significant bradycardia.  Minimum HR: 31 BPM at 1:33:30 PM Maximum HR: 93 BPM at 9:49:26 PM(2) Average HR: 62 BPM <1% PVCs, zero APCs Atrial fibrillation noted   After thie above monitor, his digoxin and metoprolol were stopped.  He was continued on diltiazem.  I spoke to Dr. Laurann Montana at that time, and we talked about restarting him on at least some low-dose Lasix to help with lower extremity edema  He saw his primary care doctor, Dr. Laurann Montana, a few days ago and still had a heart rate in the low 40s.  He has not had any further lightheadedness episodes.  He presents today here for further follow-up.  Anticoagulated for his AFib.   Denies : Chest pain. Dizziness. Leg edema. Nitroglycerin use. Orthopnea. Palpitations. Paroxysmal nocturnal dyspnea. Shortness of breath. Syncope.   No lightheadedness or syncope.  He is accompanied by an aide.  She is unaware of any symptoms.    Past Medical History:  Diagnosis Date  . Arthritis   . CHF (congestive heart failure) ([place])   . COPD (chronic obstructive pulmonary disease) ([place])   . Dysrhythmia    atrial fibrilation  . GERD (gastroesophageal reflux disease)      Past Surgical History:  Procedure Laterality Date  . hemmroidectmy    . HERNIA REPAIR    . TONSILLECTOMY       Current Outpatient Medications  Medication Sig Dispense Refill  . albuterol (PROVENTIL HFA;VENTOLIN HFA) 108 (90 BASE) MCG/ACT inhaler Inhale 1-2 puffs into the lungs every 6 (six) hours as needed for wheezing or shortness of breath.    . calcium-vitamin D (OSCAL WITH D) 500-200 MG-UNIT tablet Take 1 tablet by mouth.    . digoxin (LANOXIN) 0.125 MG tablet Take 0.125 mg by mouth daily.    Marland Kitchen diltiazem (CARDIZEM CD) 120 MG 24 hr capsule Take 120 mg by mouth daily.    Marland Kitchen donepezil (ARICEPT) 10 MG tablet Take 1 tablet (10 mg total) by mouth at bedtime. (Patient taking differently: Take 10 mg by mouth every morning. ) 30 tablet 5  . furosemide (LASIX) 20 MG tablet Take 1 tablet (20 mg total) by mouth daily.    Marland Kitchen levothyroxine (SYNTHROID, LEVOTHROID) 75 MCG tablet ONE TABLET DAILY BEFORE BREAKFAST (Patient taking differently: Take 36mcg by mouth once DAILY BEFORE BREAKFAST) 30 tablet 5  . metoprolol succinate (TOPROL-XL) 25 MG 24 hr tablet Take 25 mg by mouth daily.    Marland Kitchen omeprazole (PRILOSEC OTC) 20 MG tablet Take 20 mg by mouth daily.    . Potassium Chloride ER 20 MEQ TBCR Take 20 mEq by  mouth every morning.     . Rivaroxaban (XARELTO) 15 MG TABS tablet Take 1 tablet (15 mg total) by mouth daily with supper. (Patient taking differently: Take 15 mg by mouth every morning. ) 90 tablet 3  . sertraline (ZOLOFT) 50 MG tablet Take 25 mg by mouth every morning.     . solifenacin (VESICARE) 10 MG tablet Take 10 mg by mouth every morning.     . vitamin B-12 (CYANOCOBALAMIN) 500 MCG tablet Take 500 mcg by mouth daily.     No current facility-administered medications for this visit.     Allergies:   Patient has no known allergies.    Social History:  The patient  reports that he quit smoking about 55 years ago. His smoking use included cigarettes. He has a 80.00 pack-year smoking history.  he has never used smokeless tobacco. He reports that he drinks about 1.2 oz of alcohol per week. He reports that he does not use drugs.   Family History:  The patient's family history includes Heart attack in his mother; Pulmonary embolism in his father.    ROS:  Please see the history of present illness.   Otherwise, review of systems are positive for memory issues.   All other systems are reviewed and negative.    PHYSICAL EXAM: VS:  Ht 5\' 10"  (1.778 m)   BMI 21.52 kg/m  , BMI Body mass index is 21.52 kg/m. GEN: Well nourished, well developed, in no acute distress  HEENT: normal  Neck: no JVD, carotid bruits, or masses Cardiac: irregularly irregular, normal rate; no murmurs, rubs, or gallops,; bilateral pitting pretibial edema  Respiratory:  clear to auscultation bilaterally, normal work of breathing GI: soft, nontender, nondistended, + BS MS: no deformity or atrophy  Skin: warm and dry, no rash Neuro:  Strength and sensation are intact Psych: euthymic mood, flat affect   EKG:   The ekg ordered today demonstrates AFib with controlled ventricular response, HR 62, no significant ST segment changes   Recent Labs: No results found for requested labs within last 8760 hours.   Lipid Panel No results found for: CHOL, TRIG, HDL, CHOLHDL, VLDL, LDLCALC, LDLDIRECT   Other studies Reviewed: Additional studies/ records that were reviewed today with results demonstrating: 2014 cath showed nonobstructive CAD.  Monitor from 2018 reviewed as noted above.   ASSESSMENT AND PLAN:  1. Bradycardia/AFib: Heart rate appears to be well controlled.  Continue diltiazem.  He has been off of his digoxin and metoprolol.  We will continue to hold these medications. 2. Lightheadedness: Resolved.  No further episodes per the patient.  If he did have any lightheadedness, would stop diltiazem. 3. Anticoagulated: No recent falls.  I stressed the importance of avoiding falling, especially since he is on a  blood thinner. 4. Chronic diastolic heart failure: Mild lower extremity edema.  Consider starting back low-dose furosemide.  Overall, he looks euvolemic.  Elevate legs.   Current medicines are reviewed at length with the patient today.  The patient concerns regarding his medicines were addressed.  The following changes have been made:  No change  Labs/ tests ordered today include:  No orders of the defined types were placed in this encounter.   Recommend 150 minutes/week of aerobic exercise Low fat, low carb, high fiber diet recommended  Disposition:   FU in 6 months   Signed, Larae Grooms, MD  05/06/2017 2:09 PM    [facility] [address], [place], [place]  [address] Phone: [phone]; Fax: [phone]

## 2017-05-06 NOTE — Patient Instructions (Signed)

## 2017-05-11 DIAGNOSIS — K219 Gastro-esophageal reflux disease without esophagitis: Secondary | ICD-10-CM | POA: Diagnosis not present

## 2017-05-11 DIAGNOSIS — Z9181 History of falling: Secondary | ICD-10-CM | POA: Diagnosis not present

## 2017-05-11 DIAGNOSIS — N400 Enlarged prostate without lower urinary tract symptoms: Secondary | ICD-10-CM | POA: Diagnosis not present

## 2017-05-11 DIAGNOSIS — E039 Hypothyroidism, unspecified: Secondary | ICD-10-CM | POA: Diagnosis not present

## 2017-05-11 DIAGNOSIS — J449 Chronic obstructive pulmonary disease, unspecified: Secondary | ICD-10-CM | POA: Diagnosis not present

## 2017-05-11 DIAGNOSIS — I4891 Unspecified atrial fibrillation: Secondary | ICD-10-CM | POA: Diagnosis not present

## 2017-05-11 DIAGNOSIS — F0280 Dementia in other diseases classified elsewhere without behavioral disturbance: Secondary | ICD-10-CM | POA: Diagnosis not present

## 2017-05-11 DIAGNOSIS — R2681 Unsteadiness on feet: Secondary | ICD-10-CM | POA: Diagnosis not present

## 2017-05-11 DIAGNOSIS — Z7901 Long term (current) use of anticoagulants: Secondary | ICD-10-CM | POA: Diagnosis not present

## 2017-05-14 DIAGNOSIS — J449 Chronic obstructive pulmonary disease, unspecified: Secondary | ICD-10-CM | POA: Diagnosis not present

## 2017-05-14 DIAGNOSIS — F0280 Dementia in other diseases classified elsewhere without behavioral disturbance: Secondary | ICD-10-CM | POA: Diagnosis not present

## 2017-05-14 DIAGNOSIS — I4891 Unspecified atrial fibrillation: Secondary | ICD-10-CM | POA: Diagnosis not present

## 2017-05-14 DIAGNOSIS — Z7901 Long term (current) use of anticoagulants: Secondary | ICD-10-CM | POA: Diagnosis not present

## 2017-05-14 DIAGNOSIS — Z9181 History of falling: Secondary | ICD-10-CM | POA: Diagnosis not present

## 2017-05-14 DIAGNOSIS — E039 Hypothyroidism, unspecified: Secondary | ICD-10-CM | POA: Diagnosis not present

## 2017-05-14 DIAGNOSIS — N400 Enlarged prostate without lower urinary tract symptoms: Secondary | ICD-10-CM | POA: Diagnosis not present

## 2017-05-14 DIAGNOSIS — K219 Gastro-esophageal reflux disease without esophagitis: Secondary | ICD-10-CM | POA: Diagnosis not present

## 2017-05-14 DIAGNOSIS — R2681 Unsteadiness on feet: Secondary | ICD-10-CM | POA: Diagnosis not present

## 2017-05-17 DIAGNOSIS — Z7901 Long term (current) use of anticoagulants: Secondary | ICD-10-CM | POA: Diagnosis not present

## 2017-05-17 DIAGNOSIS — E039 Hypothyroidism, unspecified: Secondary | ICD-10-CM | POA: Diagnosis not present

## 2017-05-17 DIAGNOSIS — K219 Gastro-esophageal reflux disease without esophagitis: Secondary | ICD-10-CM | POA: Diagnosis not present

## 2017-05-17 DIAGNOSIS — I4891 Unspecified atrial fibrillation: Secondary | ICD-10-CM | POA: Diagnosis not present

## 2017-05-17 DIAGNOSIS — N400 Enlarged prostate without lower urinary tract symptoms: Secondary | ICD-10-CM | POA: Diagnosis not present

## 2017-05-17 DIAGNOSIS — F0280 Dementia in other diseases classified elsewhere without behavioral disturbance: Secondary | ICD-10-CM | POA: Diagnosis not present

## 2017-05-17 DIAGNOSIS — Z9181 History of falling: Secondary | ICD-10-CM | POA: Diagnosis not present

## 2017-05-17 DIAGNOSIS — R2681 Unsteadiness on feet: Secondary | ICD-10-CM | POA: Diagnosis not present

## 2017-05-17 DIAGNOSIS — J449 Chronic obstructive pulmonary disease, unspecified: Secondary | ICD-10-CM | POA: Diagnosis not present

## 2017-05-24 DIAGNOSIS — N400 Enlarged prostate without lower urinary tract symptoms: Secondary | ICD-10-CM | POA: Diagnosis not present

## 2017-05-24 DIAGNOSIS — K219 Gastro-esophageal reflux disease without esophagitis: Secondary | ICD-10-CM | POA: Diagnosis not present

## 2017-05-24 DIAGNOSIS — R2681 Unsteadiness on feet: Secondary | ICD-10-CM | POA: Diagnosis not present

## 2017-05-24 DIAGNOSIS — Z7901 Long term (current) use of anticoagulants: Secondary | ICD-10-CM | POA: Diagnosis not present

## 2017-05-24 DIAGNOSIS — E039 Hypothyroidism, unspecified: Secondary | ICD-10-CM | POA: Diagnosis not present

## 2017-05-24 DIAGNOSIS — I4891 Unspecified atrial fibrillation: Secondary | ICD-10-CM | POA: Diagnosis not present

## 2017-05-24 DIAGNOSIS — F0280 Dementia in other diseases classified elsewhere without behavioral disturbance: Secondary | ICD-10-CM | POA: Diagnosis not present

## 2017-05-24 DIAGNOSIS — J449 Chronic obstructive pulmonary disease, unspecified: Secondary | ICD-10-CM | POA: Diagnosis not present

## 2017-05-24 DIAGNOSIS — Z9181 History of falling: Secondary | ICD-10-CM | POA: Diagnosis not present

## 2017-06-01 DIAGNOSIS — R2681 Unsteadiness on feet: Secondary | ICD-10-CM | POA: Diagnosis not present

## 2017-06-01 DIAGNOSIS — K219 Gastro-esophageal reflux disease without esophagitis: Secondary | ICD-10-CM | POA: Diagnosis not present

## 2017-06-01 DIAGNOSIS — E039 Hypothyroidism, unspecified: Secondary | ICD-10-CM | POA: Diagnosis not present

## 2017-06-01 DIAGNOSIS — N400 Enlarged prostate without lower urinary tract symptoms: Secondary | ICD-10-CM | POA: Diagnosis not present

## 2017-06-01 DIAGNOSIS — Z9181 History of falling: Secondary | ICD-10-CM | POA: Diagnosis not present

## 2017-06-01 DIAGNOSIS — Z7901 Long term (current) use of anticoagulants: Secondary | ICD-10-CM | POA: Diagnosis not present

## 2017-06-01 DIAGNOSIS — F0280 Dementia in other diseases classified elsewhere without behavioral disturbance: Secondary | ICD-10-CM | POA: Diagnosis not present

## 2017-06-01 DIAGNOSIS — J449 Chronic obstructive pulmonary disease, unspecified: Secondary | ICD-10-CM | POA: Diagnosis not present

## 2017-06-01 DIAGNOSIS — I4891 Unspecified atrial fibrillation: Secondary | ICD-10-CM | POA: Diagnosis not present

## 2017-08-05 DIAGNOSIS — F0390 Unspecified dementia without behavioral disturbance: Secondary | ICD-10-CM | POA: Diagnosis not present

## 2017-08-05 DIAGNOSIS — I481 Persistent atrial fibrillation: Secondary | ICD-10-CM | POA: Diagnosis not present

## 2017-08-05 DIAGNOSIS — N400 Enlarged prostate without lower urinary tract symptoms: Secondary | ICD-10-CM | POA: Diagnosis not present

## 2017-08-05 DIAGNOSIS — H9192 Unspecified hearing loss, left ear: Secondary | ICD-10-CM | POA: Diagnosis not present

## 2017-08-05 DIAGNOSIS — R159 Full incontinence of feces: Secondary | ICD-10-CM | POA: Diagnosis not present

## 2017-12-04 IMAGING — DX DG HIP (WITH OR WITHOUT PELVIS) 2-3V*L*
3 series · 3 of 3 positions shown · non-contrast
Comparison: None.

CLINICAL DATA: 82-year-old male with a history of left hip pain for
months. Recent fall

EXAM:
DG HIP (WITH OR WITHOUT PELVIS) 2-3V LEFT

[t pelvis ap]
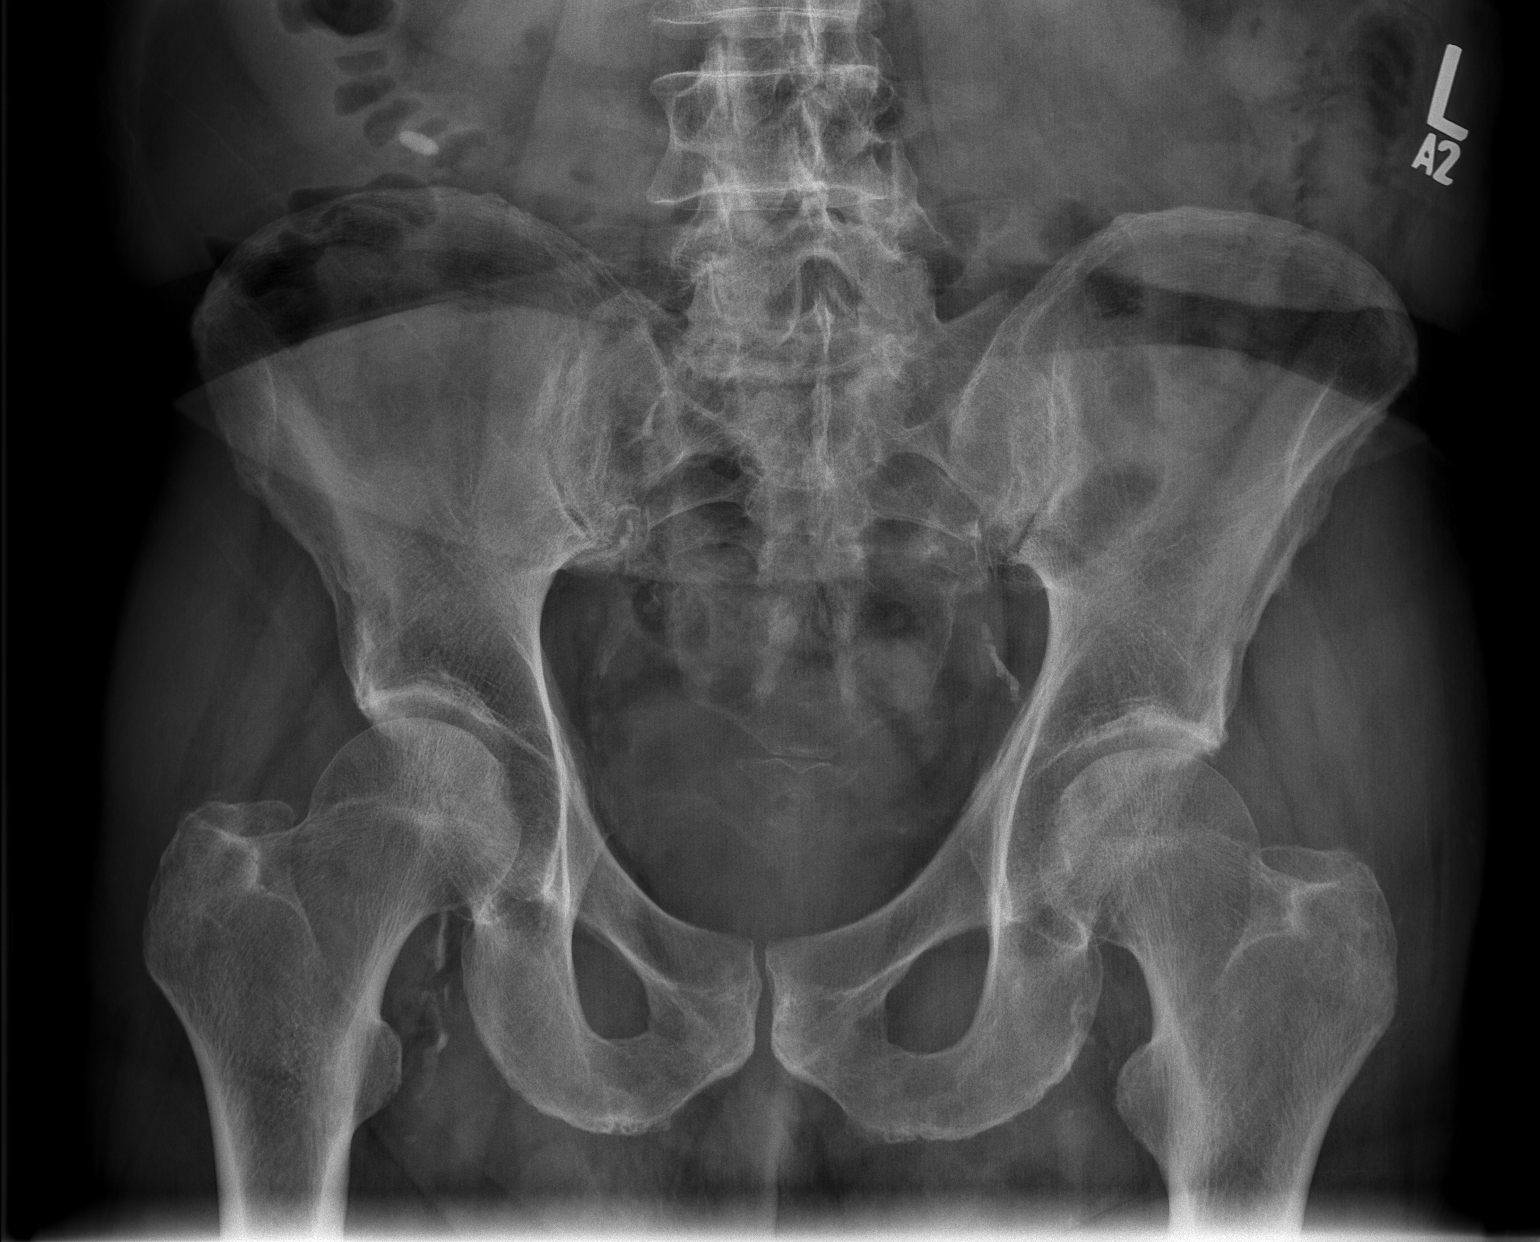

[t hip ap left]
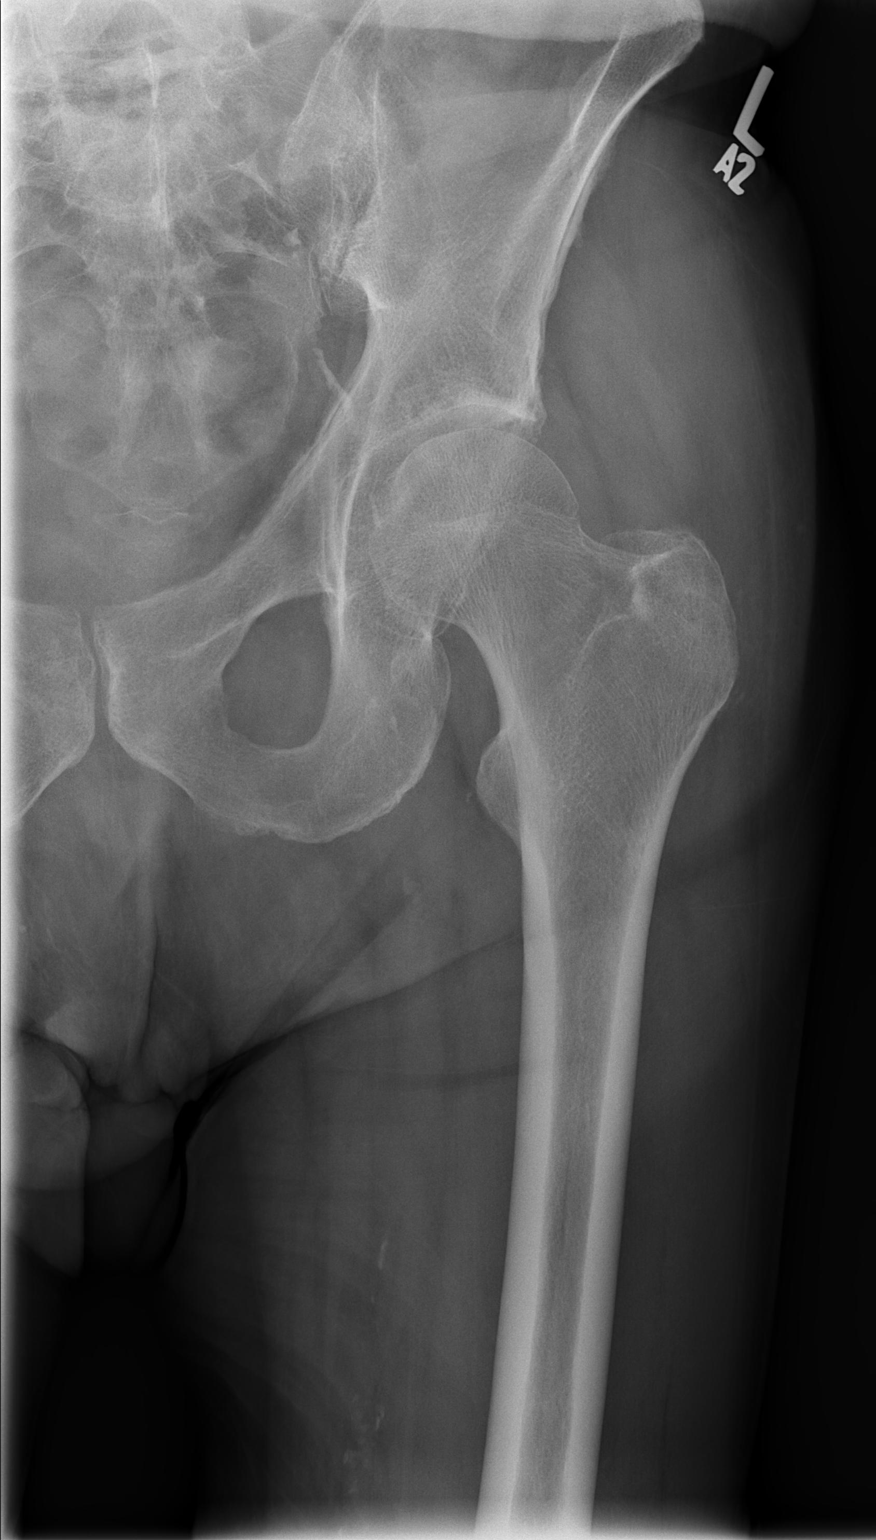

[t hip frog leg left]
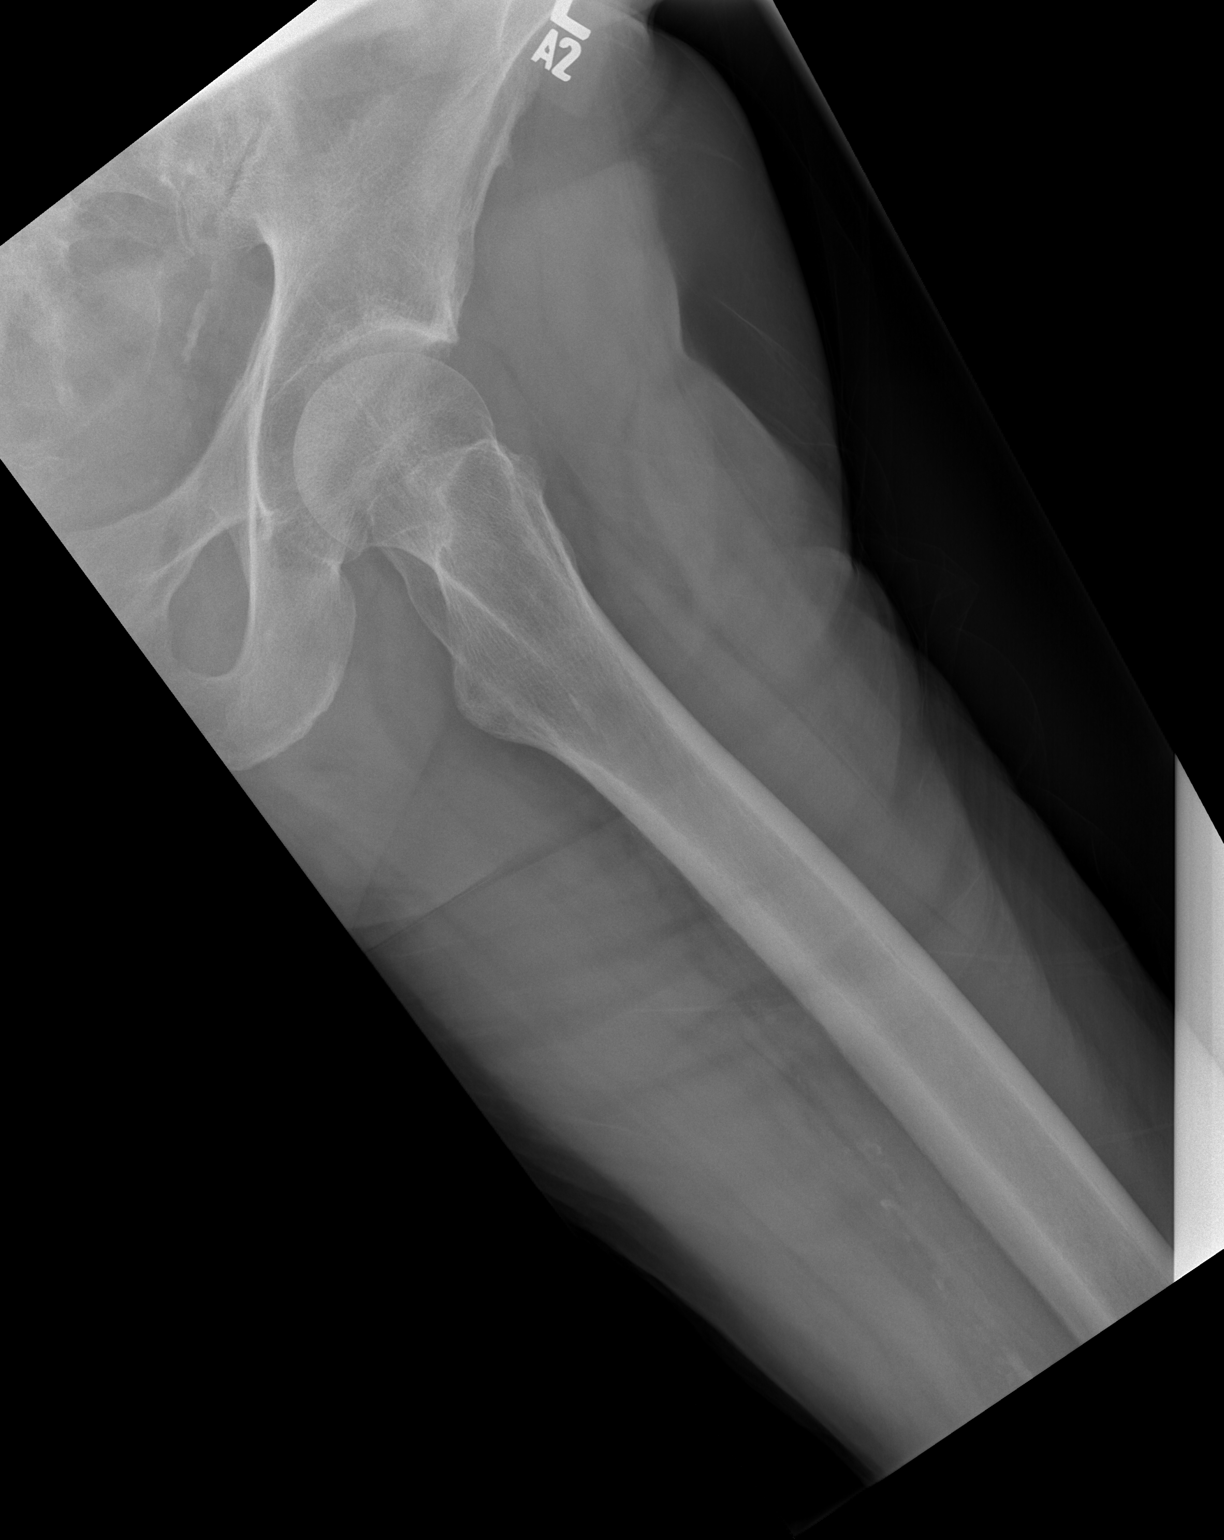

[3 of 3 positions shown; findings below may reference images not displayed]

FINDINGS: Bony pelvic ring intact with no acute fracture identified. Bilateral
hips projects normally over the acetabula. Unremarkable appearance
of the proximal left femur.

Mild degenerative changes of the lumbar spine.

Vascular calcifications.

Unremarkable appearance of the visualized bowel gas.
IMPRESSION: Negative for acute bony abnormality.

Vascular calcifications.

## 2017-12-21 DIAGNOSIS — Z85828 Personal history of other malignant neoplasm of skin: Secondary | ICD-10-CM | POA: Diagnosis not present

## 2017-12-21 DIAGNOSIS — L738 Other specified follicular disorders: Secondary | ICD-10-CM | POA: Diagnosis not present

## 2017-12-21 DIAGNOSIS — L812 Freckles: Secondary | ICD-10-CM | POA: Diagnosis not present

## 2017-12-21 DIAGNOSIS — D485 Neoplasm of uncertain behavior of skin: Secondary | ICD-10-CM | POA: Diagnosis not present

## 2017-12-21 DIAGNOSIS — L821 Other seborrheic keratosis: Secondary | ICD-10-CM | POA: Diagnosis not present

## 2017-12-21 DIAGNOSIS — D225 Melanocytic nevi of trunk: Secondary | ICD-10-CM | POA: Diagnosis not present

## 2018-02-17 ENCOUNTER — Encounter: Payer: Self-pay | Admitting: Interventional Cardiology

## 2018-03-11 NOTE — Progress Notes (Signed)
Cardiology Office Note   Date:  03/14/2018   ID:  Cameron [name], DOB 11/04/1925, MRN [id number]  PCP:  Lavone Orn, MD    No chief complaint on file.  AFib  Wt Readings from Last 3 Encounters:  03/14/18 160 lb (72.6 kg)  05/06/17 158 lb 9.6 oz (71.9 kg)  05/27/15 150 lb (68 kg)       History of Present Illness: Cameron [name] is a 84 y.o. male  who had a nonischemic cardiomyopathy diagnosed a few years ago. He was also diagnosed with AFib. His LV function improved in May 2014 echo.  Cath showed nonobstructive CAD.    He has dementia and now lives in an assisted living facility.  He was evaluated for lightheadedness episodes in 10/18.  Initially, Lasix was stopped as it was thought he may have been dehydrated.  He had a monitor showing significant bradycardia.  Minimum HR: 31 BPM at 1:33:30 PM Maximum HR: 93 BPM at 9:49:26 PM(2) Average HR: 62 BPM <1% PVCs, zero APCs Atrial fibrillation noted   After thie above monitor, his digoxin and metoprolol were stopped.  He was continued on diltiazem.  I spoke to Dr. Laurann Montana at that time, and we talked about restarting him on at least some low-dose Lasix to help with lower extremity edema  Anticoagulated for his AFib.   Since the last visit, No falls.  Overall, he has been feeling well.  Denies : Chest pain. Dizziness. Leg edema. Nitroglycerin use. Orthopnea. Palpitations. Paroxysmal nocturnal dyspnea. Shortness of breath. Syncope.   He walks a little for exercise.  No access to exercise equipment per his report.  Past Medical History:  Diagnosis Date  . Arthritis   . CHF (congestive heart failure) ([place])   . COPD (chronic obstructive pulmonary disease) ([place])   . Dysrhythmia    atrial fibrilation  . GERD (gastroesophageal reflux disease)     Past Surgical History:  Procedure Laterality Date  . hemmroidectmy    . HERNIA REPAIR    . LEFT HEART CATHETERIZATION WITH CORONARY ANGIOGRAM N/A 10/07/2012   Procedure:  LEFT HEART CATHETERIZATION WITH CORONARY ANGIOGRAM, possible PCI;  Surgeon: Jettie Booze, MD;  Location: [facility] CATH LAB;  Service: Cardiovascular;  Laterality: N/A;  . TONSILLECTOMY       Current Outpatient Medications  Medication Sig Dispense Refill  . albuterol (PROVENTIL HFA;VENTOLIN HFA) 108 (90 BASE) MCG/ACT inhaler Inhale 1-2 puffs into the lungs every 6 (six) hours as needed for wheezing or shortness of breath.    . calcium-vitamin D (OSCAL WITH D) 500-200 MG-UNIT tablet Take 1 tablet by mouth.    . digoxin (LANOXIN) 0.125 MG tablet Take 0.125 mg by mouth daily.    Marland Kitchen diltiazem (CARDIZEM CD) 120 MG 24 hr capsule Take 120 mg by mouth daily.    Marland Kitchen donepezil (ARICEPT) 10 MG tablet Take 1 tablet (10 mg total) by mouth at bedtime. (Patient taking differently: Take 10 mg by mouth every morning. ) 30 tablet 5  . furosemide (LASIX) 20 MG tablet Take 20 mg by mouth 3 (three) times a week. Take one tablet by mouth every Monday, Wednesday and Friday.    . levothyroxine (SYNTHROID, LEVOTHROID) 75 MCG tablet ONE TABLET DAILY BEFORE BREAKFAST (Patient taking differently: Take 59mcg by mouth once DAILY BEFORE BREAKFAST) 30 tablet 5  . loperamide (ANTI-DIARRHEAL) 2 MG capsule Take 2 mg by mouth as needed for diarrhea or loose stools.    Marland Kitchen omeprazole (PRILOSEC OTC) 20 MG  tablet Take 20 mg by mouth daily.    . Rivaroxaban (XARELTO) 15 MG TABS tablet Take 1 tablet (15 mg total) by mouth daily with supper. (Patient taking differently: Take 15 mg by mouth every morning. ) 90 tablet 3  . sertraline (ZOLOFT) 50 MG tablet Take 25 mg by mouth every morning.     . solifenacin (VESICARE) 10 MG tablet Take 10 mg by mouth every morning.     . tamsulosin (FLOMAX) 0.4 MG CAPS capsule Take 1 capsule daily by mouth.    . vitamin B-12 (CYANOCOBALAMIN) 500 MCG tablet Take 500 mcg by mouth daily.     No current facility-administered medications for this visit.     Allergies:   Patient has no known allergies.     Social History:  The patient  reports that he quit smoking about 56 years ago. His smoking use included cigarettes. He has a 80.00 pack-year smoking history. He has never used smokeless tobacco. He reports that he drinks about 2.0 standard drinks of alcohol per week. He reports that he does not use drugs.   Family History:  The patient's family history includes Heart attack in his mother; Pulmonary embolism in his father.    ROS:  Please see the history of present illness.   Otherwise, review of systems are positive for memory issues.   All other systems are reviewed and negative.    PHYSICAL EXAM: VS:  BP 140/88   Pulse 87   Ht 5\' 9"  (1.753 m)   Wt 160 lb (72.6 kg)   SpO2 97%   BMI 23.63 kg/m  , BMI Body mass index is 23.63 kg/m. GEN: Well nourished, well developed, in no acute distress  HEENT: normal  Neck: no JVD, carotid bruits, or masses Cardiac: irregularly irregular; no murmurs, rubs, or gallops,no edema  Respiratory:  clear to auscultation bilaterally, normal work of breathing GI: soft, nontender, nondistended, + BS MS: no deformity or atrophy  Skin: warm and dry, no rash Neuro:  Strength and sensation are intact Psych: euthymic mood, full affect   EKG:   The ekg ordered today demonstrates AFib with controlled ventricular response   Recent Labs: No results found for requested labs within last 8760 hours.   Lipid Panel No results found for: CHOL, TRIG, HDL, CHOLHDL, VLDL, LDLCALC, LDLDIRECT   Other studies Reviewed: Additional studies/ records that were reviewed today with results demonstrating: EF 30-35% in 2014.   ASSESSMENT AND PLAN:  1. Bradycardia/AFib: HR well controlled.  No sx of AFib.  Continue current rate control meds.  2. Lightheadedness: Resolved. BP and HR are stable.  3. Anticoagulated: Check CBC and BMet today.  COntinue Xarelto.  4. Chronic diastolic heart failure:  Appears euvolemic.  No significant LE edema.    Current medicines are  reviewed at length with the patient today.  The patient concerns regarding his medicines were addressed.  The following changes have been made:  No change  Labs/ tests ordered today include:  No orders of the defined types were placed in this encounter.   Recommend 150 minutes/week of aerobic exercise Low fat, low carb, high fiber diet recommended  Disposition:   FU in 9 months   Signed, Larae Grooms, MD  03/14/2018 9:53 AM    [facility] [place], [place], [place]  [address] Phone: [phone]; Fax: [phone]

## 2018-03-14 ENCOUNTER — Ambulatory Visit: Payer: PPO | Admitting: Interventional Cardiology

## 2018-03-14 ENCOUNTER — Encounter: Payer: Self-pay | Admitting: Interventional Cardiology

## 2018-03-14 VITALS — BP 140/88 | HR 87 | Ht 69.0 in | Wt 160.0 lb

## 2018-03-14 DIAGNOSIS — I482 Chronic atrial fibrillation, unspecified: Secondary | ICD-10-CM

## 2018-03-14 DIAGNOSIS — I5032 Chronic diastolic (congestive) heart failure: Secondary | ICD-10-CM

## 2018-03-14 DIAGNOSIS — Z7901 Long term (current) use of anticoagulants: Secondary | ICD-10-CM

## 2018-03-14 LAB — CBC
HEMOGLOBIN: 13.7 g/dL (ref 13.0–17.7)
Hematocrit: 38.7 % (ref 37.5–51.0)
MCH: 34.3 pg — ABNORMAL HIGH (ref 26.6–33.0)
MCHC: 35.4 g/dL (ref 31.5–35.7)
MCV: 97 fL (ref 79–97)
PLATELETS: 243 10*3/uL (ref 150–450)
RBC: 4 x10E6/uL — AB (ref 4.14–5.80)
RDW: 13.4 % (ref 12.3–15.4)
WBC: 7.4 10*3/uL (ref 3.4–10.8)

## 2018-03-14 LAB — BASIC METABOLIC PANEL
BUN/Creatinine Ratio: 11 (ref 10–24)
BUN: 13 mg/dL (ref 8–27)
CO2: 24 mmol/L (ref 20–29)
CREATININE: 1.18 mg/dL (ref 0.76–1.27)
Calcium: 9.5 mg/dL (ref 8.6–10.2)
Chloride: 101 mmol/L (ref 96–106)
GFR calc non Af Amer: 54 mL/min/{1.73_m2} — ABNORMAL LOW (ref >59)
GFR, EST AFRICAN AMERICAN: 63 mL/min/{1.73_m2} (ref >59)
Glucose: 106 mg/dL — ABNORMAL HIGH (ref 65–99)
Potassium: 3.7 mmol/L (ref 3.5–5.2)
SODIUM: 139 mmol/L (ref 134–144)

## 2018-03-14 NOTE — Patient Instructions (Signed)
Medication Instructions:  Your physician recommends that you continue on your current medications as directed. Please refer to the Current Medication list given to you today.   Labwork: TODAY: CBC, BMET  Testing/Procedures: None ordered  Follow-Up: Your physician wants you to follow-up in: 9 months with Dr. Irish Lack. You will receive a reminder letter in the mail two months in advance. If you don't receive a letter, please call our office to schedule the follow-up appointment.   Any Other Special Instructions Will Be Listed Below (If Applicable).     If you need a refill on your cardiac medications before your next appointment, please call your pharmacy.

## 2018-05-12 DIAGNOSIS — K219 Gastro-esophageal reflux disease without esophagitis: Secondary | ICD-10-CM | POA: Diagnosis not present

## 2018-05-12 DIAGNOSIS — F0390 Unspecified dementia without behavioral disturbance: Secondary | ICD-10-CM | POA: Diagnosis not present

## 2018-05-12 DIAGNOSIS — R159 Full incontinence of feces: Secondary | ICD-10-CM | POA: Diagnosis not present

## 2018-05-12 DIAGNOSIS — Z23 Encounter for immunization: Secondary | ICD-10-CM | POA: Diagnosis not present

## 2018-05-12 DIAGNOSIS — Z0000 Encounter for general adult medical examination without abnormal findings: Secondary | ICD-10-CM | POA: Diagnosis not present

## 2018-05-12 DIAGNOSIS — E039 Hypothyroidism, unspecified: Secondary | ICD-10-CM | POA: Diagnosis not present

## 2018-05-12 DIAGNOSIS — N401 Enlarged prostate with lower urinary tract symptoms: Secondary | ICD-10-CM | POA: Diagnosis not present

## 2018-05-12 DIAGNOSIS — Z1389 Encounter for screening for other disorder: Secondary | ICD-10-CM | POA: Diagnosis not present

## 2018-05-12 DIAGNOSIS — I482 Chronic atrial fibrillation, unspecified: Secondary | ICD-10-CM | POA: Diagnosis not present

## 2018-11-11 DIAGNOSIS — K219 Gastro-esophageal reflux disease without esophagitis: Secondary | ICD-10-CM | POA: Diagnosis not present

## 2018-11-11 DIAGNOSIS — I482 Chronic atrial fibrillation, unspecified: Secondary | ICD-10-CM | POA: Diagnosis not present

## 2018-11-11 DIAGNOSIS — F0390 Unspecified dementia without behavioral disturbance: Secondary | ICD-10-CM | POA: Diagnosis not present

## 2018-11-11 DIAGNOSIS — N400 Enlarged prostate without lower urinary tract symptoms: Secondary | ICD-10-CM | POA: Diagnosis not present

## 2018-12-02 ENCOUNTER — Telehealth: Payer: Self-pay

## 2018-12-02 NOTE — Telephone Encounter (Signed)

## 2018-12-09 NOTE — Progress Notes (Signed)
Virtual Visit via Video Note   This visit type was conducted due to national recommendations for restrictions regarding the COVID-19 Pandemic (e.g. social distancing) in an effort to limit this patient's exposure and mitigate transmission in our community.  Due to his co-morbid illnesses, this patient is at least at moderate risk for complications without adequate follow up.  This format is felt to be most appropriate for this patient at this time.  All issues noted in this document were discussed and addressed.  A limited physical exam was performed with this format.  Please refer to the patient's chart for his consent to telehealth for [facility].   Date:  12/12/2018   ID:  [place] [name], DOB 06-02-1926, MRN [id number]  Patient Location: Home Provider Location: Home  PCP:  Lavone Orn, MD  Cardiologist:  No primary care provider on file. [place] Electrophysiologist:  None   Evaluation Performed:  Follow-Up Visit  Chief Complaint:  AFib  History of Present Illness:    Cameron [name] is a 85 y.o. male  who had a nonischemic cardiomyopathy diagnosed a fewyears ago. He was also diagnosed with AFib. His LV function improved in May 2014 echo.Cath showed nonobstructive CAD.   He has dementia and now lives in an assisted living facility.  He wasevaluated for lightheadedness episodesin 10/18. Initially, Lasix was stopped as it was thought he may have been dehydrated. He had a monitor showing significant bradycardia.  Minimum HR: 31 BPM at 1:33:30 PM Maximum HR: 93 BPM at 9:49:26 PM(2) Average HR: 62 BPM <1% PVCs, zero APCs Atrial fibrillation noted  After thie above monitor, his digoxin and metoprolol were stopped. He was continued on diltiazem. I spoke to Dr. Laurann Montana at that time,and we talked about restarting him on at least some low-dose Lasix to help with lower extremity edema  Anticoagulated for his AFib.No falls.  Staying in a facility and states, they  are on "lockdown."  Denies : Chest pain. Dizziness. Leg edema. Nitroglycerin use. Orthopnea. Palpitations. Paroxysmal nocturnal dyspnea. Shortness of breath. Syncope.   No bleeding issues  The patient does not have symptoms concerning for COVID-19 infection (fever, chills, cough, or new shortness of breath).    Past Medical History:  Diagnosis Date  . Arthritis   . CHF (congestive heart failure) ([place])   . COPD (chronic obstructive pulmonary disease) ([place])   . Dysrhythmia    atrial fibrilation  . GERD (gastroesophageal reflux disease)    Past Surgical History:  Procedure Laterality Date  . hemmroidectmy    . HERNIA REPAIR    . LEFT HEART CATHETERIZATION WITH CORONARY ANGIOGRAM N/A 10/07/2012   Procedure: LEFT HEART CATHETERIZATION WITH CORONARY ANGIOGRAM, possible PCI;  Surgeon: Jettie Booze, MD;  Location: [facility] CATH LAB;  Service: Cardiovascular;  Laterality: N/A;  . TONSILLECTOMY       Current Meds  Medication Sig  . albuterol (PROVENTIL HFA;VENTOLIN HFA) 108 (90 BASE) MCG/ACT inhaler Inhale 1-2 puffs into the lungs every 6 (six) hours as needed for wheezing or shortness of breath.  . calcium-vitamin D (OSCAL WITH D) 500-200 MG-UNIT tablet Take 1 tablet by mouth.  . diltiazem (CARDIZEM CD) 120 MG 24 hr capsule Take 120 mg by mouth daily.  Marland Kitchen donepezil (ARICEPT) 10 MG tablet Take 1 tablet (10 mg total) by mouth at bedtime. (Patient taking differently: Take 10 mg by mouth every morning. )  . furosemide (LASIX) 20 MG tablet Take 20 mg by mouth 3 (three) times a week. Take one  tablet by mouth every Monday, Wednesday and Friday.  . levothyroxine (SYNTHROID, LEVOTHROID) 75 MCG tablet ONE TABLET DAILY BEFORE BREAKFAST (Patient taking differently: Take 73mcg by mouth once DAILY BEFORE BREAKFAST)  . loperamide (ANTI-DIARRHEAL) 2 MG capsule Take 2 mg by mouth as needed for diarrhea or loose stools.  Marland Kitchen omeprazole (PRILOSEC OTC) 20 MG tablet Take 20 mg by mouth daily.  . Rivaroxaban  (XARELTO) 15 MG TABS tablet Take 1 tablet (15 mg total) by mouth daily with supper. (Patient taking differently: Take 15 mg by mouth every morning. )  . sertraline (ZOLOFT) 50 MG tablet Take 25 mg by mouth every morning.   . solifenacin (VESICARE) 10 MG tablet Take 10 mg by mouth every morning.   . tamsulosin (FLOMAX) 0.4 MG CAPS capsule Take 1 capsule daily by mouth.  . vitamin B-12 (CYANOCOBALAMIN) 500 MCG tablet Take 500 mcg by mouth daily.     Allergies:   Patient has no known allergies.   Social History   Tobacco Use  . Smoking status: Former Smoker    Packs/day: 2.00    Years: 40.00    Pack years: 80.00    Types: Cigarettes    Quit date: 06/29/1961    Years since quitting: 57.4  . Smokeless tobacco: Never Used  Substance Use Topics  . Alcohol use: Yes    Alcohol/week: 2.0 standard drinks    Types: 2 Glasses of wine per week    Comment: occasional  . Drug use: No     Family Hx: The patient's family history includes Heart attack in his mother; Pulmonary embolism in his father.  ROS:   Please see the history of present illness.    Less exercise All other systems reviewed and are negative.   Prior CV studies:   The following studies were reviewed today:  EF 30-35% in 2014  Labs/Other Tests and Data Reviewed:    EKG:  An ECG dated 9/19 was personally reviewed today and demonstrated:  AFib, rate controlled  Recent Labs: 03/14/2018: BUN 13; Creatinine, Ser 1.18; Hemoglobin 13.7; Platelets 243; Potassium 3.7; Sodium 139   Recent Lipid Panel No results found for: CHOL, TRIG, HDL, CHOLHDL, LDLCALC, LDLDIRECT  Wt Readings from Last 3 Encounters:  03/14/18 160 lb (72.6 kg)  05/06/17 158 lb 9.6 oz (71.9 kg)  05/27/15 150 lb (68 kg)     Objective:    Vital Signs:  Ht 5\' 9"  (1.753 m)   BMI 23.63 kg/m    VITAL SIGNS:  reviewed GEN:  no acute distress RESPIRATORY:  no Shortness of breath PSYCH:  normal affect limited exam by phione format  ASSESSMENT & PLAN:     1. Bradycardia/AFib: NO bradycardia when he checks at home.  2. Lightheadedness: Resolved 3. Anticoagulated: No bleeding issues.  No falls recently.  4. Chronic systolic diastolic heart failure: No swelling.  He checks his legs regularly and has not had any problems.  He avoids adding salt to his food.  EF 30-35% in the past.  WOuld recheck when virus fears decrease. 5. Not checking lipids at this point.  6. I will update his son and daughter-in-law, Inocente Salles and Rishaan Gunner.   COVID-19 Education: The signs and symptoms of COVID-19 were discussed with the patient and how to seek care for testing (follow up with PCP or arrange E-visit).  The importance of social distancing was discussed today.  Time:   Today, I have spent 15 minutes with the patient with telehealth technology discussing the above problems.  Medication Adjustments/Labs and Tests Ordered: Current medicines are reviewed at length with the patient today.  Concerns regarding medicines are outlined above.   Tests Ordered: No orders of the defined types were placed in this encounter.   Medication Changes: No orders of the defined types were placed in this encounter.   Follow Up:  Virtual Visit in 9 month(s)  Signed, Larae Grooms, MD  12/12/2018 10:18 AM    [place]

## 2018-12-12 ENCOUNTER — Encounter: Payer: Self-pay | Admitting: Interventional Cardiology

## 2018-12-12 ENCOUNTER — Telehealth (INDEPENDENT_AMBULATORY_CARE_PROVIDER_SITE_OTHER): Payer: PPO | Admitting: Interventional Cardiology

## 2018-12-12 ENCOUNTER — Other Ambulatory Visit: Payer: Self-pay

## 2018-12-12 VITALS — Ht 69.0 in

## 2018-12-12 DIAGNOSIS — R42 Dizziness and giddiness: Secondary | ICD-10-CM

## 2018-12-12 DIAGNOSIS — I482 Chronic atrial fibrillation, unspecified: Secondary | ICD-10-CM | POA: Diagnosis not present

## 2018-12-12 DIAGNOSIS — Z7901 Long term (current) use of anticoagulants: Secondary | ICD-10-CM

## 2018-12-12 DIAGNOSIS — I5032 Chronic diastolic (congestive) heart failure: Secondary | ICD-10-CM

## 2018-12-12 NOTE — Patient Instructions (Signed)
Medication Instructions:  Your physician recommends that you continue on your current medications as directed. Please refer to the Current Medication list given to you today.  If you need a refill on your cardiac medications before your next appointment, please call your pharmacy.   Lab work: None Ordered  If you have labs (blood work) drawn today and your tests are completely normal, you will receive your results only by: . MyChart Message (if you have MyChart) OR . A paper copy in the mail If you have any lab test that is abnormal or we need to change your treatment, we will call you to review the results.  Testing/Procedures: None ordered  Follow-Up: At [facility], you and your health needs are our priority.  As part of our continuing mission to provide you with exceptional heart care, we have created designated Provider Care Teams.  These Care Teams include your primary Cardiologist (physician) and Advanced Practice Providers (APPs -  Physician Assistants and Nurse Practitioners) who all work together to provide you with the care you need, when you need it. . You will need a follow up appointment in 9 months.  Please call our office 2 months in advance to schedule this appointment.  You may see Jay Varanasi, MD or one of the following Advanced Practice Providers on your designated Care Team:   . Brittainy Simmons, PA-C . Dayna Dunn, PA-C . Michele Lenze, PA-C  Any Other Special Instructions Will Be Listed Below (If Applicable).    

## 2019-03-22 ENCOUNTER — Ambulatory Visit
Admission: RE | Admit: 2019-03-22 | Discharge: 2019-03-22 | Disposition: A | Discharge status: Home / Self Care | Payer: PPO | Source: Ambulatory Visit | Attending: Internal Medicine | Admitting: Internal Medicine

## 2019-03-22 ENCOUNTER — Other Ambulatory Visit: Payer: Self-pay | Admitting: Internal Medicine

## 2019-03-22 DIAGNOSIS — S0990XA Unspecified injury of head, initial encounter: Secondary | ICD-10-CM | POA: Diagnosis not present

## 2019-03-23 ENCOUNTER — Emergency Department (HOSPITAL_COMMUNITY)
Admission: EM | Admit: 2019-03-23 | Discharge: 2019-03-23 | Disposition: A | Discharge status: Home / Self Care | Payer: PPO | Attending: Emergency Medicine | Admitting: Emergency Medicine

## 2019-03-23 ENCOUNTER — Other Ambulatory Visit: Payer: Self-pay

## 2019-03-23 ENCOUNTER — Encounter (HOSPITAL_COMMUNITY): Payer: Self-pay | Admitting: Emergency Medicine

## 2019-03-23 DIAGNOSIS — J449 Chronic obstructive pulmonary disease, unspecified: Secondary | ICD-10-CM | POA: Insufficient documentation

## 2019-03-23 DIAGNOSIS — Z79899 Other long term (current) drug therapy: Secondary | ICD-10-CM | POA: Insufficient documentation

## 2019-03-23 DIAGNOSIS — R21 Rash and other nonspecific skin eruption: Secondary | ICD-10-CM | POA: Insufficient documentation

## 2019-03-23 DIAGNOSIS — Z7901 Long term (current) use of anticoagulants: Secondary | ICD-10-CM | POA: Diagnosis not present

## 2019-03-23 DIAGNOSIS — R531 Weakness: Secondary | ICD-10-CM | POA: Diagnosis not present

## 2019-03-23 DIAGNOSIS — R269 Unspecified abnormalities of gait and mobility: Secondary | ICD-10-CM | POA: Diagnosis not present

## 2019-03-23 DIAGNOSIS — I4891 Unspecified atrial fibrillation: Secondary | ICD-10-CM | POA: Insufficient documentation

## 2019-03-23 DIAGNOSIS — I5032 Chronic diastolic (congestive) heart failure: Secondary | ICD-10-CM | POA: Diagnosis not present

## 2019-03-23 LAB — DIFFERENTIAL
Abs Immature Granulocytes: 0.03 10*3/uL (ref 0.00–0.07)
Basophils Absolute: 0 10*3/uL (ref 0.0–0.1)
Basophils Relative: 0 %
Eosinophils Absolute: 0 10*3/uL (ref 0.0–0.5)
Eosinophils Relative: 0 %
Immature Granulocytes: 0 %
Lymphocytes Relative: 26 %
Lymphs Abs: 2.1 10*3/uL (ref 0.7–4.0)
Monocytes Absolute: 0.8 10*3/uL (ref 0.1–1.0)
Monocytes Relative: 10 %
Neutro Abs: 5 10*3/uL (ref 1.7–7.7)
Neutrophils Relative %: 64 %

## 2019-03-23 LAB — COMPREHENSIVE METABOLIC PANEL
ALT: 10 U/L (ref 0–44)
AST: 18 U/L (ref 15–41)
Albumin: 3.5 g/dL (ref 3.5–5.0)
Alkaline Phosphatase: 60 U/L (ref 38–126)
Anion gap: 11 (ref 5–15)
BUN: 11 mg/dL (ref 8–23)
CO2: 25 mmol/L (ref 22–32)
Calcium: 9.1 mg/dL (ref 8.9–10.3)
Chloride: 100 mmol/L (ref 98–111)
Creatinine, Ser: 1.44 mg/dL — ABNORMAL HIGH (ref 0.61–1.24)
GFR calc Af Amer: 49 mL/min — ABNORMAL LOW (ref >60)
GFR calc non Af Amer: 42 mL/min — ABNORMAL LOW (ref >60)
Glucose, Bld: 136 mg/dL — ABNORMAL HIGH (ref 70–99)
Potassium: 3.6 mmol/L (ref 3.5–5.1)
Sodium: 136 mmol/L (ref 135–145)
Total Bilirubin: 0.9 mg/dL (ref 0.3–1.2)
Total Protein: 7.2 g/dL (ref 6.5–8.1)

## 2019-03-23 LAB — I-STAT CHEM 8, ED
BUN: 13 mg/dL (ref 8–23)
Calcium, Ion: 1.14 mmol/L — ABNORMAL LOW (ref 1.15–1.40)
Chloride: 101 mmol/L (ref 98–111)
Creatinine, Ser: 1.3 mg/dL — ABNORMAL HIGH (ref 0.61–1.24)
Glucose, Bld: 128 mg/dL — ABNORMAL HIGH (ref 70–99)
HCT: 40 % (ref 39.0–52.0)
Hemoglobin: 13.6 g/dL (ref 13.0–17.0)
Potassium: 3.5 mmol/L (ref 3.5–5.1)
Sodium: 136 mmol/L (ref 135–145)
TCO2: 22 mmol/L (ref 22–32)

## 2019-03-23 LAB — CBC
HCT: 39.2 % (ref 39.0–52.0)
Hemoglobin: 13.8 g/dL (ref 13.0–17.0)
MCH: 34.8 pg — ABNORMAL HIGH (ref 26.0–34.0)
MCHC: 35.2 g/dL (ref 30.0–36.0)
MCV: 99 fL (ref 80.0–100.0)
Platelets: 181 10*3/uL (ref 150–400)
RBC: 3.96 MIL/uL — ABNORMAL LOW (ref 4.22–5.81)
RDW: 12.6 % (ref 11.5–15.5)
WBC: 8 10*3/uL (ref 4.0–10.5)
nRBC: 0 % (ref 0.0–0.2)

## 2019-03-23 LAB — URINALYSIS, ROUTINE W REFLEX MICROSCOPIC
Bilirubin Urine: NEGATIVE
Glucose, UA: NEGATIVE mg/dL
Hgb urine dipstick: NEGATIVE
Ketones, ur: NEGATIVE mg/dL
Leukocytes,Ua: NEGATIVE
Nitrite: NEGATIVE
Protein, ur: 30 mg/dL — AB
Specific Gravity, Urine: 1.02 (ref 1.005–1.030)
pH: 6 (ref 5.0–8.0)

## 2019-03-23 LAB — CBG MONITORING, ED: Glucose-Capillary: 111 mg/dL — ABNORMAL HIGH (ref 70–99)

## 2019-03-23 LAB — PROTIME-INR
INR: 1.8 — ABNORMAL HIGH (ref 0.8–1.2)
Prothrombin Time: 20.3 seconds — ABNORMAL HIGH (ref 11.4–15.2)

## 2019-03-23 LAB — PHOSPHORUS: Phosphorus: 3.3 mg/dL (ref 2.5–4.6)

## 2019-03-23 LAB — APTT: aPTT: 43 seconds — ABNORMAL HIGH (ref 24–36)

## 2019-03-23 LAB — MAGNESIUM: Magnesium: 2.1 mg/dL (ref 1.7–2.4)

## 2019-03-23 MED ORDER — SODIUM CHLORIDE 0.9% FLUSH
3.0000 mL | Freq: Once | INTRAVENOUS | Status: AC
  Administered 2019-03-23: 3 mL via INTRAVENOUS

## 2019-03-23 MED ORDER — SODIUM CHLORIDE 0.9 % IV BOLUS: 1000.0000 mL | Freq: Once | INTRAVENOUS | Status: DC

## 2019-03-23 MED ORDER — FLUORESCEIN SODIUM 1 MG OP STRP
1.0000 | ORAL_STRIP | Freq: Once | OPHTHALMIC | Status: AC
  Administered 2019-03-23: 1 via OPHTHALMIC
  Filled 2019-03-23: qty 1

## 2019-03-23 MED ORDER — VALACYCLOVIR HCL 1 G PO TABS: 1000.0000 mg | ORAL_TABLET | Freq: Two times a day (BID) | ORAL | 0 refills | Status: AC

## 2019-03-23 MED ORDER — SODIUM CHLORIDE 0.9 % IV BOLUS
500.0000 mL | Freq: Once | INTRAVENOUS | Status: AC
  Administered 2019-03-23: 20:00:00 500 mL via INTRAVENOUS

## 2019-03-23 NOTE — ED Triage Notes (Signed)
Pt arrives with family from [place] with c/o of after a fall on Sunday he has been having a decline in health - pt has been not eating well, weakness unable to ambulate , and also decrease urine output.

## 2019-03-23 NOTE — Discharge Instructions (Signed)
Make sure you are staying well-hydrated with water.  Your urine should be clear to pale yellow. Take valacyclovir for the next week. Follow-up with your primary care doctor tomorrow to schedule appointment. You should discuss generalized weakness/deconditioning and possible need for physical therapy. Follow-up with the eye doctor tomorrow at 145 for evaluation of the rash on your forehead. Follow-up with the neurologist, Dr. Carles Collet, listed below for further evaluation of your gait. Return to the emergency room with any new, worsening, concerning symptoms.

## 2019-03-23 NOTE — ED Provider Notes (Signed)
[facility] EMERGENCY DEPARTMENT Provider Note   CSN: [id number] Arrival date & time: 03/23/19  1333     History   Chief Complaint Chief Complaint  Patient presents with  . Fall  . Weakness    HPI Cameron [name] is a 85 y.o. male presenting for evaluation of generalized weakness and abnormal gait.  Patient states he fell a few days ago.  He cannot state what caused him to fall.  He states he hit his head, but no injury elsewhere.  He had imaging done yesterday which was negative.  Patient states he is on blood thinners, has been taking them as prescribed. Patient son present during history.  Patient's son states in the past 2 weeks, patient has had significant physical decline and change in his gait.  Normally patient is ambulatory out difficulty, recently been noted to have a shuffling gait.  Patient states he is unaware of this, but states he is always had issues with his balance.  Pt reports decreased oral intake, and son reports patient is not good about drinking water.  Patient also has decreased urination, but no dysuria or hematuria.  Patient denies recent fevers, chills, chest pain, shortness of breath, nausea, vomiting, abdominal pain, abnormal bowel movements.  Additional history obtained from chart review.  CT scan from yesterday reviewed, no sign of intracranial bleed.  Additional history of CHF, COPD, A. fib on anticoagulation, dementia.     HPI  Past Medical History:  Diagnosis Date  . Arthritis   . CHF (congestive heart failure) ([place])   . COPD (chronic obstructive pulmonary disease) ([place])   . Dysrhythmia    atrial fibrilation  . GERD (gastroesophageal reflux disease)     Patient Active Problem List   Diagnosis Date Noted  . Anticoagulated 05/06/2017  . Spells of decreased attentiveness 04/01/2017  . Community acquired pneumonia   . Chronic diastolic heart failure ([place])   . Physical deconditioning   . Influenza-like illness 05/27/2015  .  Atrial fibrillation ([place]) 08/23/2013  . CAP (community acquired pneumonia) 07/17/2013  . Dementia ([place]) 07/12/2013  . Weakness generalized 07/12/2013  . Diarrhea 07/12/2013  . Protein-calorie malnutrition, severe ([place]) 03/30/2013  . PNA (pneumonia) 03/28/2013  . Fever 03/27/2013  . Hypotension 03/27/2013  . Sepsis ([place]) 03/26/2013  . FUO (fever of unknown origin) 03/26/2013  . NICM (nonischemic cardiomyopathy) EF 30% 03/26/2013  . Chronic diastolic HF (heart failure) ([place]) 03/26/2013  . Persistent atrial fibrillation 10/04/2012  . Acute systolic heart failure ([place]) 10/04/2012  . ALLERGIC RHINITIS 03/15/2007  . BRONCHIECTASIS, W/O ACUTE EXACERBATION 03/15/2007  . PULMONARY FIBROSIS, POSTINFLAMMATORY 03/15/2007    Past Surgical History:  Procedure Laterality Date  . hemmroidectmy    . HERNIA REPAIR    . LEFT HEART CATHETERIZATION WITH CORONARY ANGIOGRAM N/A 10/07/2012   Procedure: LEFT HEART CATHETERIZATION WITH CORONARY ANGIOGRAM, possible PCI;  Surgeon: Jettie Booze, MD;  Location: [facility] CATH LAB;  Service: Cardiovascular;  Laterality: N/A;  . TONSILLECTOMY          Home Medications    Prior to Admission medications   Medication Sig Start Date End Date Taking? Authorizing Provider  albuterol (PROVENTIL HFA;VENTOLIN HFA) 108 (90 BASE) MCG/ACT inhaler Inhale 1-2 puffs into the lungs every 6 (six) hours as needed for wheezing or shortness of breath.    [provider]  calcium-vitamin D (OSCAL WITH D) 500-200 MG-UNIT tablet Take 1 tablet by mouth.    [provider]  diltiazem (CARDIZEM CD) 120  MG 24 hr capsule Take 120 mg by mouth daily.    [provider]  donepezil (ARICEPT) 10 MG tablet Take 1 tablet (10 mg total) by mouth at bedtime. Patient taking differently: Take 10 mg by mouth every morning.  10/09/12   Norins, Heinz Knuckles, MD  furosemide (LASIX) 20 MG tablet Take 20 mg by mouth 3 (three) times a week. Take one tablet by mouth every Monday,  Wednesday and Friday.    [provider]  levothyroxine (SYNTHROID, LEVOTHROID) 75 MCG tablet ONE TABLET DAILY BEFORE BREAKFAST Patient taking differently: Take 3mcg by mouth once DAILY BEFORE BREAKFAST    Norins, Heinz Knuckles, MD  loperamide (ANTI-DIARRHEAL) 2 MG capsule Take 2 mg by mouth as needed for diarrhea or loose stools.    [provider]  omeprazole (PRILOSEC OTC) 20 MG tablet Take 20 mg by mouth daily.    [provider]  Rivaroxaban (XARELTO) 15 MG TABS tablet Take 1 tablet (15 mg total) by mouth daily with supper. Patient taking differently: Take 15 mg by mouth every morning.  08/23/13   Jettie Booze, MD  sertraline (ZOLOFT) 50 MG tablet Take 25 mg by mouth every morning.     [provider]  solifenacin (VESICARE) 10 MG tablet Take 10 mg by mouth every morning.     [provider]  tamsulosin (FLOMAX) 0.4 MG CAPS capsule Take 1 capsule daily by mouth. 04/15/17   [provider]  valACYclovir (VALTREX) 1000 MG tablet Take 1 tablet (1,000 mg total) by mouth 2 (two) times daily for 7 days. 03/23/19 03/30/19  Micahel Omlor, PA-C  vitamin B-12 (CYANOCOBALAMIN) 500 MCG tablet Take 500 mcg by mouth daily.    [provider]    Family History Family History  Problem Relation Age of Onset  . Heart attack Mother   . Pulmonary embolism Father     Social History Social History   Tobacco Use  . Smoking status: Former Smoker    Packs/day: 2.00    Years: 40.00    Pack years: 80.00    Types: Cigarettes    Quit date: 06/29/1961    Years since quitting: 57.7  . Smokeless tobacco: Never Used  Substance Use Topics  . Alcohol use: Yes    Alcohol/week: 2.0 standard drinks    Types: 2 Glasses of wine per week    Comment: occasional  . Drug use: No     Allergies   Patient has no known allergies.   Review of Systems Review of Systems  Constitutional: Positive for appetite change.  Genitourinary: Positive for  decreased urine volume.  Neurological: Positive for weakness.       Gait change     Physical Exam Updated Vital Signs BP (!) 144/71   Pulse 83   Temp 98 F (36.7 C) (Oral)   Resp 16   SpO2 100%   Physical Exam Vitals signs and nursing note reviewed.  Constitutional:      General: He is not in acute distress.    Appearance: He is well-developed.     Comments: Elderly male resting comfortably in the bed in no acute distress  HENT:     Head: Normocephalic and atraumatic.     Comments: Rash noted over left eye into the forehead.  Patient states this is from the fall, however on my exam there are scabbed over lesions (?abrasion) however other lesions maybe vesicular Eyes:     Conjunctiva/sclera: Conjunctivae normal.     Pupils:  Pupils are equal, round, and reactive to light.     Comments: No dendritic lesions noted on fluorescein stain.  Neck:     Musculoskeletal: Normal range of motion and neck supple.  Cardiovascular:     Rate and Rhythm: Normal rate and regular rhythm.     Pulses: Normal pulses.  Pulmonary:     Effort: Pulmonary effort is normal. No respiratory distress.     Breath sounds: Normal breath sounds. No wheezing.  Abdominal:     General: There is no distension.     Palpations: Abdomen is soft. There is no mass.     Tenderness: There is no abdominal tenderness. There is no guarding or rebound.     Comments: No tenderness palpation the abdomen.  Musculoskeletal: Normal range of motion.     Comments: Strength and sensation intact x4.  Patient is ambulatory with a mild shuffling gait, but otherwise without difficulty ambulating.  Skin:    General: Skin is warm and dry.     Capillary Refill: Capillary refill takes less than 2 seconds.  Neurological:     Mental Status: He is alert and oriented to person, place, and time.     GCS: GCS eye subscore is 4. GCS verbal subscore is 5. GCS motor subscore is 6.     Sensory: Sensation is intact.     Motor: Motor function is  intact.     Coordination: Coordination is intact.     Gait: Gait abnormal.      ED Treatments / Results  Labs (all labs ordered are listed, but only abnormal results are displayed) Labs Reviewed  PROTIME-INR - Abnormal; Notable for the following components:      Result Value   Prothrombin Time 20.3 (*)    INR 1.8 (*)    All other components within normal limits  APTT - Abnormal; Notable for the following components:   aPTT 43 (*)    All other components within normal limits  CBC - Abnormal; Notable for the following components:   RBC 3.96 (*)    MCH 34.8 (*)    All other components within normal limits  COMPREHENSIVE METABOLIC PANEL - Abnormal; Notable for the following components:   Glucose, Bld 136 (*)    Creatinine, Ser 1.44 (*)    GFR calc non Af Amer 42 (*)    GFR calc Af Amer 49 (*)    All other components within normal limits  URINALYSIS, ROUTINE W REFLEX MICROSCOPIC - Abnormal; Notable for the following components:   APPearance HAZY (*)    Protein, ur 30 (*)    Bacteria, UA RARE (*)    All other components within normal limits  I-STAT CHEM 8, ED - Abnormal; Notable for the following components:   Creatinine, Ser 1.30 (*)    Glucose, Bld 128 (*)    Calcium, Ion 1.14 (*)    All other components within normal limits  CBG MONITORING, ED - Abnormal; Notable for the following components:   Glucose-Capillary 111 (*)    All other components within normal limits  URINE CULTURE  DIFFERENTIAL  MAGNESIUM  PHOSPHORUS    EKG None  Radiology Ct Head Wo Contrast  Result Date: 03/22/2019 CLINICAL DATA:  Blunt trauma post fall, on blood thinners EXAM: CT HEAD WITHOUT CONTRAST TECHNIQUE: Contiguous axial images were obtained from the base of the skull through the vertex without intravenous contrast. COMPARISON:  MR 04/07/2017 and previous FINDINGS: Brain: Diffuse parenchymal atrophy. Patchy areas of hypoattenuation in deep  and periventricular white matter bilaterally.  Negative for acute intracranial hemorrhage, mass lesion, acute infarction, midline shift, or mass-effect. Acute infarct may be inapparent on noncontrast CT. Ventricles and sulci symmetric. Vascular: Atherosclerotic and physiologic intracranial calcifications. Skull: Normal. Negative for fracture or focal lesion. Sinuses/Orbits: No acute finding. Other: Left frontal scalp soft tissue swelling. IMPRESSION: 1. Negative for bleed or other acute intracranial process. 2. Atrophy and nonspecific white matter changes. Electronically Signed   By: Lucrezia Europe M.D.   On: 03/22/2019 16:48    Procedures Procedures (including critical care time)  Medications Ordered in ED Medications  sodium chloride flush (NS) 0.9 % injection 3 mL (3 mLs Intravenous Given 03/23/19 1939)  sodium chloride 0.9 % bolus 500 mL (0 mLs Intravenous Stopped 03/23/19 2115)  fluorescein ophthalmic strip 1 strip (1 strip Left Eye Given by Other 03/23/19 2115)     Initial Impression / Assessment and Plan / ED Course  I have reviewed the triage vital signs and the nursing notes.  Pertinent labs & imaging results that were available during my care of the patient were reviewed by me and considered in my medical decision making (see chart for details).        Pt presenting for evaluation of gait changes and generalized weakness.  Physical exam shows patient appears nontoxic.  Besides some mild withdrawal from his gait, no other neurologic deficits.  Patient is ambulatory had difficulty.  Patient has skin abrasion versus rash of the left forehead, consider shingles versus injury.  I see the CT head yesterday, I do not believe he needs repeat.  Labs obtained from triage overall reassuring.  Show slight increase in creatinine, baseline 1.1 and patient is 1.4.  500 cc of fluid given.  Urine without obvious infection.  EKG shows A. fib.  Discussed with attending, Dr. Roslynn Amble evaluated the patient. Will consult with ophthalmology for concern for  possible shingles around the eye.  No lesions on the tip of the nose.  Discussed with Dr. Coralyn Pear from ophthalmology who recommends fluorescein stain.  If noted treated lesions, start on acyclovir until pt can f/u in office tomorrow.  On exam, patient without dendritic lesions.  As patient is already on a blood thinner, and there are no other focal neuro deficits, I do not believe he needs an emergent MRI.  Instead, patient would benefit from outpatient f/u with neurology. Ambulatory referral made.  Dist[place], patient has appointment with his PCP tomorrow.  Discussed importance of management by PCP and encouraged discussion about physical therapy versus other substance at his facility.  Results and plan discussed with patient and family, who are agreeable. At this time, pt appears safe for d/c. Return precautions given. Pt and son state they understand and agree to plan.   Final Clinical Impressions(s) / ED Diagnoses   Final diagnoses:  Abnormal gait  General weakness      Franchot Heidelberg, PA-C 03/23/19 2359    Lucrezia Starch, MD 03/25/19 [phone]

## 2019-03-23 NOTE — ED Notes (Signed)
[place]s lamp at bedside.  

## 2019-03-24 ENCOUNTER — Encounter (INDEPENDENT_AMBULATORY_CARE_PROVIDER_SITE_OTHER): Payer: Self-pay | Admitting: Ophthalmology

## 2019-03-24 ENCOUNTER — Ambulatory Visit (INDEPENDENT_AMBULATORY_CARE_PROVIDER_SITE_OTHER): Payer: PPO | Admitting: Ophthalmology

## 2019-03-24 ENCOUNTER — Other Ambulatory Visit: Payer: Self-pay

## 2019-03-24 DIAGNOSIS — Z961 Presence of intraocular lens: Secondary | ICD-10-CM | POA: Diagnosis not present

## 2019-03-24 DIAGNOSIS — B029 Zoster without complications: Secondary | ICD-10-CM

## 2019-03-24 DIAGNOSIS — R3914 Feeling of incomplete bladder emptying: Secondary | ICD-10-CM | POA: Diagnosis not present

## 2019-03-24 DIAGNOSIS — H3581 Retinal edema: Secondary | ICD-10-CM | POA: Diagnosis not present

## 2019-03-24 DIAGNOSIS — N400 Enlarged prostate without lower urinary tract symptoms: Secondary | ICD-10-CM | POA: Diagnosis not present

## 2019-03-24 DIAGNOSIS — R39198 Other difficulties with micturition: Secondary | ICD-10-CM | POA: Diagnosis not present

## 2019-03-24 DIAGNOSIS — R269 Unspecified abnormalities of gait and mobility: Secondary | ICD-10-CM | POA: Diagnosis not present

## 2019-03-24 LAB — URINE CULTURE

## 2019-03-24 MED ORDER — ERYTHROMYCIN 5 MG/GM OP OINT: 1.0000 "application " | TOPICAL_OINTMENT | Freq: Four times a day (QID) | OPHTHALMIC | 3 refills | Status: AC

## 2019-03-24 NOTE — Progress Notes (Addendum)
[facility] Note  03/24/2019     CHIEF COMPLAINT Patient presents for Retina Evaluation   HISTORY OF PRESENT ILLNESS: Cameron [name] is a 85 y.o. male who presents to the clinic today for:   HPI    Retina Evaluation    In left eye.  This started days ago.  Duration of days.  Context:  distance vision.  I, the attending physician,  performed the HPI with the patient and updated documentation appropriately.          Comments    New patient retina eval (ED follow up) Patient lives in [facility] assisted [facility] facility and recently fell in his room and hit his head.  Patient states his vision stayed the same.  Patients son, who is present today states a CT scan was done and did not show any abnormalities but states patient has had shingles break out in and around left eye.  Patient denies eye pain or discomfort.       Last edited by Bernarda Caffey, MD on 03/24/2019 12:14 PM. (History)    forehead vesicles first noted Monday, pt has had cataract sx with Dr. Wallis Mart  Referring physician: Lavone Orn, MD [place] [address] [place],  [place] [address]  HISTORICAL INFORMATION:   Selected notes from the MEDICAL RECORD NUMBER    CURRENT MEDICATIONS: Current Outpatient Medications (Ophthalmic Drugs)  Medication Sig  . erythromycin ophthalmic ointment Place 1 application into the left eye 4 (four) times daily. Apply to skin vesicles on eyelid and forehead   No current facility-administered medications for this visit.  (Ophthalmic Drugs)   Current Outpatient Medications (Other)  Medication Sig  . albuterol (PROVENTIL HFA;VENTOLIN HFA) 108 (90 BASE) MCG/ACT inhaler Inhale 1-2 puffs into the lungs every 6 (six) hours as needed for wheezing or shortness of breath.  . calcium-vitamin D (OSCAL WITH D) 500-200 MG-UNIT tablet Take 1 tablet by mouth.  . diltiazem (CARDIZEM CD) 120 MG 24 hr capsule Take 120 mg by mouth daily.  Marland Kitchen donepezil (ARICEPT)  10 MG tablet Take 1 tablet (10 mg total) by mouth at bedtime. (Patient taking differently: Take 10 mg by mouth every morning. )  . furosemide (LASIX) 20 MG tablet Take 20 mg by mouth 3 (three) times a week. Take one tablet by mouth every Monday, Wednesday and Friday.  . levothyroxine (SYNTHROID, LEVOTHROID) 75 MCG tablet ONE TABLET DAILY BEFORE BREAKFAST (Patient taking differently: Take 91mcg by mouth once DAILY BEFORE BREAKFAST)  . loperamide (ANTI-DIARRHEAL) 2 MG capsule Take 2 mg by mouth as needed for diarrhea or loose stools.  Marland Kitchen omeprazole (PRILOSEC OTC) 20 MG tablet Take 20 mg by mouth daily.  . Rivaroxaban (XARELTO) 15 MG TABS tablet Take 1 tablet (15 mg total) by mouth daily with supper. (Patient taking differently: Take 15 mg by mouth every morning. )  . sertraline (ZOLOFT) 50 MG tablet Take 25 mg by mouth every morning.   . solifenacin (VESICARE) 10 MG tablet Take 10 mg by mouth every morning.   . tamsulosin (FLOMAX) 0.4 MG CAPS capsule Take 1 capsule daily by mouth.  . valACYclovir (VALTREX) 1000 MG tablet Take 1 tablet (1,000 mg total) by mouth 2 (two) times daily for 7 days.  . vitamin B-12 (CYANOCOBALAMIN) 500 MCG tablet Take 500 mcg by mouth daily.   No current facility-administered medications for this visit.  (Other)      REVIEW OF SYSTEMS: ROS    Positive for: Eyes  Negative for: Constitutional, Gastrointestinal, Neurological, Skin, Genitourinary, Musculoskeletal, HENT, Endocrine, Cardiovascular, Respiratory, Psychiatric, Allergic/Imm, Heme/Lymph   Last edited by Doneen Poisson on 03/24/2019 11:43 AM. (History)       ALLERGIES No Known Allergies  PAST MEDICAL HISTORY Past Medical History:  Diagnosis Date  . Arthritis   . CHF (congestive heart failure) ([place])   . COPD (chronic obstructive pulmonary disease) ([place])   . Dysrhythmia    atrial fibrilation  . GERD (gastroesophageal reflux disease)    Past Surgical History:  Procedure Laterality Date  .  hemmroidectmy    . HERNIA REPAIR    . LEFT HEART CATHETERIZATION WITH CORONARY ANGIOGRAM N/A 10/07/2012   Procedure: LEFT HEART CATHETERIZATION WITH CORONARY ANGIOGRAM, possible PCI;  Surgeon: Jettie Booze, MD;  Location: [facility] CATH LAB;  Service: Cardiovascular;  Laterality: N/A;  . TONSILLECTOMY      FAMILY HISTORY Family History  Problem Relation Age of Onset  . Heart attack Mother   . Pulmonary embolism Father     SOCIAL HISTORY Social History   Tobacco Use  . Smoking status: Former Smoker    Packs/day: 2.00    Years: 40.00    Pack years: 80.00    Types: Cigarettes    Quit date: 06/29/1961    Years since quitting: 57.7  . Smokeless tobacco: Never Used  Substance Use Topics  . Alcohol use: Yes    Alcohol/week: 2.0 standard drinks    Types: 2 Glasses of wine per week    Comment: occasional  . Drug use: No         OPHTHALMIC EXAM:  Base Eye Exam    Visual Acuity (Snellen - Linear)      Right Left   Dist [place] 20/30 +1 20/30 -3   Dist ph [place] 20/25 -1 20/25 -2   Correction: Glasses       Tonometry (Tonopen, 12:04 PM)      Right Left   Pressure 13 13       Pupils      Dark Light Shape React APD   Right 3 2 Round Minimal 0   Left 3 2 Round Minimal 0       Visual Fields      Left Right    Full Full       Extraocular Movement      Right Left    Full Full       Neuro/Psych    Oriented x3: Yes   Mood/Affect: Normal       Dilation    Both eyes: 1.0% Mydriacyl, 2.5% Phenylephrine @ 12:04 PM        Slit Lamp and Fundus Exam    Slit Lamp Exam      Right Left   Lids/Lashes Dermatochalasis - upper lid, Meibomian gland dysfunction, Ptosis, +laxity LL Dermatochalasis - upper lid, Meibomian gland dysfunction, Ptosis, +laxity of LL   Conjunctiva/Sclera White and quiet White and quiet   Cornea Arcus, Debris in tear film, trace Punctate epithelial erosions 1+ Punctate epithelial erosions, no dendrites of florescein staining   Anterior Chamber Deep and  quiet Deep and quiet   Iris Round and poorly dilated to 3.71mm Round and poorly dilated to 37mm   Lens Posterior chamber intraocular lens Posterior chamber intraocular lens   Vitreous Vitreous syneresis, no cell Vitreous syneresis, no cell       Fundus Exam      Right Left   Disc Mild Pallor, Sharp rim, mild, temporal Peripapillary atrophy  Mild Pallor, Sharp rim, mild, temporal Peripapillary atrophy   C/D Ratio 0.4 0.3   Macula Flat, Blunted foveal reflex Flat, Blunted foveal reflex, trace Epiretinal membrane, No heme or edema   Vessels Vascular attenuation Vascular attenuation, Tortuous   Periphery Attached, limited view due to poor dilation, temporal drusen Attached, limited view due to poor dilation, temporal drusen          IMAGING AND PROCEDURES  Imaging and Procedures for @TODAY @  OCT, Retina - OU - Both Eyes       Right Eye Quality was good. Central Foveal Thickness: 255. Progression has no prior data. Findings include normal foveal contour, no IRF, no SRF.   Left Eye Quality was good. Central Foveal Thickness: 298. Progression has no prior data. Findings include normal foveal contour, no IRF, no SRF, epiretinal membrane.   Notes *Images captured and stored on drive  Diagnosis / Impression:  NFP; no IRF/SRF OU +ERM OS  Clinical management:  See below  Abbreviations: NFP - Normal foveal profile. CME - cystoid macular edema. PED - pigment epithelial detachment. IRF - intraretinal fluid. SRF - subretinal fluid. EZ - ellipsoid zone. ERM - epiretinal membrane. ORA - outer retinal atrophy. ORT - outer retinal tubulation. SRHM - subretinal hyper-reflective material                 ASSESSMENT/PLAN:    ICD-10-CM   1. Retinal edema  H35.81 OCT, Retina - OU - Both Eyes  2. Herpes zoster without complication  [id number]   3. Pseudophakia of both eyes  Z96.1     1. Herpes zoster without ocular invovlement - crusted and hemorrhagic skin vesicles in left V1 dermatome -  negative Hutchinson's sign - no conjunctival staining or corneal dendrites or pseudodendrites on fluorescein staining - started on po valtrex -- 1g TID -- by Urgent Care -- agree - recommend erythromycin ointment to skin vesicles - f/u here prn  2. No retinal edema on exam or OCT  3. Pseudophakia OU  - s/p CE/IOL (Dr. Katy Fitch)  - IOLs in good position, doing well  - monitor   Ophthalmic Meds Ordered this visit:  Meds ordered this encounter  Medications  . erythromycin ophthalmic ointment    Sig: Place 1 application into the left eye 4 (four) times daily. Apply to skin vesicles on eyelid and forehead    Dispense:  3.5 g    Refill:  3       Return if symptoms worsen or fail to improve.  There are no Patient Instructions on file for this visit.   Explained the diagnoses, plan, and follow up with the patient and they expressed understanding.  Patient expressed understanding of the importance of proper follow up care.   This document serves as a record of services personally performed by Gardiner Sleeper, MD, PhD. It was created on their behalf by Ernest Mallick, OA, an ophthalmic assistant. The creation of this record is the provider's dictation and/or activities during the visit.    Electronically signed by: Ernest Mallick, OA 09.25.2020 8:41 PM    Gardiner Sleeper, M.D., Ph.D. Diseases & Surgery of the Retina and Vitreous [facility]  I have reviewed the above documentation for accuracy and completeness, and I agree with the above. Gardiner Sleeper, M.D., Ph.D. 03/24/19 8:41 PM   Abbreviations: M myopia (nearsighted); A astigmatism; H hyperopia (farsighted); P presbyopia; Mrx spectacle prescription;  CTL contact lenses; OD right eye; OS left eye; OU  both eyes  XT exotropia; ET esotropia; PEK punctate epithelial keratitis; PEE punctate epithelial erosions; DES dry eye syndrome; MGD meibomian gland dysfunction; ATs artificial tears; PFAT's preservative free  artificial tears; [place] nuclear sclerotic cataract; PSC posterior subcapsular cataract; ERM epi-retinal membrane; PVD posterior vitreous detachment; RD retinal detachment; DM diabetes mellitus; DR diabetic retinopathy; NPDR non-proliferative diabetic retinopathy; PDR proliferative diabetic retinopathy; CSME clinically significant macular edema; DME diabetic macular edema; dbh dot blot hemorrhages; CWS cotton wool spot; POAG primary open angle glaucoma; C/D cup-to-disc ratio; HVF humphrey visual field; GVF goldmann visual field; OCT optical coherence tomography; IOP intraocular pressure; BRVO Branch retinal vein occlusion; CRVO central retinal vein occlusion; CRAO central retinal artery occlusion; BRAO branch retinal artery occlusion; RT retinal tear; SB scleral buckle; PPV pars plana vitrectomy; VH Vitreous hemorrhage; PRP panretinal laser photocoagulation; IVK intravitreal kenalog; VMT vitreomacular traction; MH Macular hole;  NVD neovascularization of the disc; NVE neovascularization elsewhere; AREDS age related eye disease study; ARMD age related macular degeneration; POAG primary open angle glaucoma; EBMD epithelial/anterior basement membrane dystrophy; ACIOL anterior chamber intraocular lens; IOL intraocular lens; PCIOL posterior chamber intraocular lens; Phaco/IOL phacoemulsification with intraocular lens placement; [place] photorefractive keratectomy; LASIK laser assisted in situ keratomileusis; HTN hypertension; DM diabetes mellitus; COPD chronic obstructive pulmonary disease

## 2019-04-17 NOTE — Progress Notes (Signed)
NEUROLOGY CONSULTATION NOTE  [name] MRN: [id number] DOB: Dec 16, 1925  Referring provider: Franchot Heidelberg, PA-C Primary care provider: Lavone Orn, MD  Reason for consult:  Unsteady gait.  HISTORY OF PRESENT ILLNESS: Cameron [name] is a 85 year old right-handed male with atrial fibrillation, CHF, COPD and arthritis who presents for generalized weakness and worsening gait abnormality.  History supplemented by ED note.  CT head from 03/22/2019 and MRI brain from 04/07/2017 personally reviewed.  He resides at [facility].  Two weeks ago, he was found to have fallen.  On face time, his daughter noted a bruising over his left eye.  CT head without contrast on 03/22/2019 personally reviewed and showed atrophy and chronic small vessel ischemic changes in the periventricular white matter but no acute intracranial abnormality.  It was found that the "bruise" was actually shingles.  However, he did endorse that he fell once.  He got tangled in the sheets while getting out of bed.  He denies further falls.  His son actually saw him two weeks prior to the ED visit and he appeared to walk well.  After this event, he seemed to be shuffling more.  No tremors or difficulty writing or using utensils.  He does admit to not drinking enough water and lately had some decrease in oral intake.  Over the past month, since the ED visit, he has improved.  His family history is noted for tremor in his father.  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  PAST MEDICAL HISTORY: Past Medical History:  Diagnosis Date  . Arthritis   . CHF (congestive heart failure) ([place])   . COPD (chronic obstructive pulmonary disease) ([place])   . Dysrhythmia    atrial fibrilation   . GERD (gastroesophageal reflux disease)     PAST SURGICAL HISTORY: Past Surgical History:  Procedure Laterality Date  . hemmroidectmy    . HERNIA REPAIR    . LEFT HEART CATHETERIZATION WITH CORONARY ANGIOGRAM N/A 10/07/2012   Procedure: LEFT HEART CATHETERIZATION WITH CORONARY ANGIOGRAM, possible PCI;  Surgeon: Jettie Booze, MD;  Location: [facility] CATH LAB;  Service: Cardiovascular;  Laterality: N/A;  . TONSILLECTOMY      MEDICATIONS: Current Outpatient Medications on File Prior to Visit  Medication Sig Dispense Refill  . albuterol (PROVENTIL HFA;VENTOLIN HFA) 108 (90 BASE) MCG/ACT inhaler Inhale 1-2 puffs into the lungs every 6 (six) hours as needed for wheezing or shortness of breath.    . calcium-vitamin D (OSCAL WITH D) 500-200 MG-UNIT tablet Take 1 tablet by mouth.    . diltiazem (CARDIZEM CD) 120 MG 24 hr capsule Take 120 mg by mouth daily.    Marland Kitchen donepezil (ARICEPT) 10 MG tablet Take 1 tablet (10 mg total) by mouth at bedtime. (Patient taking differently: Take 10 mg by mouth every morning. ) 30 tablet 5  . erythromycin ophthalmic ointment Place 1 application into the left eye 4 (four) times daily. Apply to skin vesicles on eyelid and forehead 3.5 g 3  . furosemide (LASIX) 20 MG tablet Take 20 mg by mouth 3 (three) times a week. Take one tablet by mouth every Monday, Wednesday and Friday.    . levothyroxine (SYNTHROID, LEVOTHROID) 75 MCG tablet ONE TABLET DAILY BEFORE BREAKFAST (Patient taking differently: Take 12mcg by mouth once DAILY  BEFORE BREAKFAST) 30 tablet 5  . loperamide (ANTI-DIARRHEAL) 2 MG capsule Take 2 mg by mouth as needed for diarrhea or loose stools.    Marland Kitchen omeprazole (PRILOSEC OTC) 20 MG tablet Take 20 mg by mouth daily.    . Rivaroxaban (XARELTO) 15 MG TABS tablet Take 1 tablet (15 mg total) by mouth daily with supper. (Patient taking differently: Take 15 mg by mouth every morning. ) 90 tablet 3  . sertraline (ZOLOFT) 50 MG tablet Take 25 mg by mouth every morning.      . solifenacin (VESICARE) 10 MG tablet Take 10 mg by mouth every morning.     . tamsulosin (FLOMAX) 0.4 MG CAPS capsule Take 1 capsule daily by mouth.    . vitamin B-12 (CYANOCOBALAMIN) 500 MCG tablet Take 500 mcg by mouth daily.     No current facility-administered medications on file prior to visit.     ALLERGIES: No Known Allergies  FAMILY HISTORY: Family History  Problem Relation Age of Onset  . Heart attack Mother   . Pulmonary embolism Father     SOCIAL HISTORY: Social History   Socioeconomic History  . Marital status: Widowed    Spouse name: Not on file  . Number of children: 4  . Years of education: Not on file  . Highest education level: Bachelor's degree (e.g., BA, AB, BS)  Occupational History  . Not on file  Social Needs  . Financial resource strain: Not on file  . Food insecurity    Worry: Not on file    Inability: Not on file  . Transportation needs    Medical: Not on file    Non-medical: Not on file  Tobacco Use  . Smoking status: Former Smoker    Packs/day: 2.00    Years: 40.00    Pack years: 80.00    Types: Cigarettes    Quit date: 06/29/1961    Years since quitting: 57.8  . Smokeless tobacco: Never Used  Substance and Sexual Activity  . Alcohol use: Yes    Alcohol/week: 2.0 standard drinks    Types: 2 Glasses of wine per week    Comment: occasional  . Drug use: No  . Sexual activity: Never  Lifestyle  . Physical activity    Days per week: Not on file    Minutes per session: Not on file  . Stress: Not on file  Relationships  . Social Herbalist on phone: Not on file    Gets together: Not on file    Attends religious service: Not on file    Active member of club or organization: Not on file    Attends meetings of clubs or organizations: Not on file    Relationship status: Not on file  . Intimate partner violence    Fear of current or ex partner: Not on file    Emotionally abused: Not on file    Physically abused: Not on  file    Forced sexual activity: Not on file  Other Topics Concern  . Not on file  Social History Narrative   Pt lives at [facility], pt is a widower, he has 4 children   Right handed, BS degree   Pt drinks coffee, tea, and soda when he can   He does not exercise regularly   Sometime walk       REVIEW OF SYSTEMS: Constitutional: No fevers, chills, or sweats, no generalized fatigue, change in appetite Eyes: No visual changes, double vision, eye  pain Ear, nose and throat: No hearing loss, ear pain, nasal congestion, sore throat Cardiovascular: No chest pain, palpitations Respiratory:  No shortness of breath at rest or with exertion, wheezes GastrointestinaI: No nausea, vomiting, diarrhea, abdominal pain, fecal incontinence Genitourinary:  No dysuria, urinary retention or frequency Musculoskeletal:  No neck pain, back pain Integumentary: No rash, pruritus, skin lesions Neurological: as above Psychiatric: No depression, insomnia, anxiety Endocrine: No palpitations, fatigue, diaphoresis, mood swings, change in appetite, change in weight, increased thirst Hematologic/Lymphatic:  No purpura, petechiae. Allergic/Immunologic: no itchy/runny eyes, nasal congestion, recent allergic reactions, rashes  PHYSICAL EXAM: Blood pressure 126/81, pulse 82, height 5\' 11"  (1.803 m), weight 154 lb 3.2 oz (69.9 kg), SpO2 98 %. General: No acute distress.  Patient appears well-groomed.   Head:  Normocephalic/atraumatic Eyes:  fundi examined but not visualized Neck: supple, no paraspinal tenderness, full range of motion Back: No paraspinal tenderness Heart: irregular irrregular rate and rhythm Lungs: Clear to auscultation bilaterally. Vascular: No carotid bruits. Neurological Exam: Mental status: alert and oriented to person, place, and time, delayed recall reduced, remote memory intact, fund of knowledge intact, attention and concentration intact, speech fluent and not dysarthric, language intact.   Cranial nerves: CN I: not tested CN II: pupils equal, round and reactive to light, visual fields intact CN III, IV, VI:  full range of motion, no nystagmus, no ptosis CN V: facial sensation intact CN VII: upper and lower face symmetric CN VIII: hearing intact CN IX, X: gag intact, uvula midline CN XI: sternocleidomastoid and trapezius muscles intact CN XII: tongue midline Bulk & Tone: normal, no fasciculations, no rigidity. Motor:  5/5 throughout. No tremor. Sensation:  Pinprick and vibration sensation intact.  Deep Tendon Reflexes:  3+ in upper extremities, 2+ lower extremities,  toes downgoing.   Finger to nose testing:  Without dysmetria.   Heel to shin:  Without dysmetria.   Gait:  Wide based gait, short stride, mildly stooped posture.  Romberg with sway.  IMPRESSION: Unsteady gait.  May be multifactorial, related to recent viral infection in conjunction with dehydration and decreased physical activity.  He does not exhibit any lateralizing symptoms to suggest stroke.  I do not think his exam supports Parkinson's disease.  He does have some increased reflexes in the upper extremities, raising possibility of cervical spinal stenosis.  However, he doesn't have a spastic gait, weakness or significant altered gait that would warrant MRI of cervical spine, especially since he told me he would not consider surgery anyway.  Overall, he has been improving,  PLAN: 1.  Physical therapy 2.  Follow up as needed/status changes.  Thank you for allowing me to take part in the care of this patient.  Metta Clines, DO  CC: Lavone Orn, MD

## 2019-04-20 ENCOUNTER — Other Ambulatory Visit: Payer: Self-pay

## 2019-04-20 ENCOUNTER — Encounter: Payer: Self-pay | Admitting: Neurology

## 2019-04-20 ENCOUNTER — Ambulatory Visit (INDEPENDENT_AMBULATORY_CARE_PROVIDER_SITE_OTHER): Payer: PPO | Admitting: Neurology

## 2019-04-20 VITALS — BP 126/81 | HR 82 | Ht 71.0 in | Wt 154.2 lb

## 2019-04-20 DIAGNOSIS — R2681 Unsteadiness on feet: Secondary | ICD-10-CM

## 2019-04-20 NOTE — Patient Instructions (Signed)
We will send you for physical therapy Follow up as needed

## 2019-04-26 DIAGNOSIS — R3912 Poor urinary stream: Secondary | ICD-10-CM | POA: Diagnosis not present

## 2019-04-26 DIAGNOSIS — R350 Frequency of micturition: Secondary | ICD-10-CM | POA: Diagnosis not present

## 2019-04-26 DIAGNOSIS — N401 Enlarged prostate with lower urinary tract symptoms: Secondary | ICD-10-CM | POA: Diagnosis not present

## 2019-04-27 DIAGNOSIS — R2681 Unsteadiness on feet: Secondary | ICD-10-CM | POA: Diagnosis not present

## 2019-04-27 DIAGNOSIS — Z9181 History of falling: Secondary | ICD-10-CM | POA: Diagnosis not present

## 2019-04-28 DIAGNOSIS — R2681 Unsteadiness on feet: Secondary | ICD-10-CM | POA: Diagnosis not present

## 2019-04-28 DIAGNOSIS — Z9181 History of falling: Secondary | ICD-10-CM | POA: Diagnosis not present

## 2019-05-02 DIAGNOSIS — M6281 Muscle weakness (generalized): Secondary | ICD-10-CM | POA: Diagnosis not present

## 2019-05-02 DIAGNOSIS — R2681 Unsteadiness on feet: Secondary | ICD-10-CM | POA: Diagnosis not present

## 2019-05-02 DIAGNOSIS — Z9181 History of falling: Secondary | ICD-10-CM | POA: Diagnosis not present

## 2019-05-04 DIAGNOSIS — M6281 Muscle weakness (generalized): Secondary | ICD-10-CM | POA: Diagnosis not present

## 2019-05-04 DIAGNOSIS — Z9181 History of falling: Secondary | ICD-10-CM | POA: Diagnosis not present

## 2019-05-04 DIAGNOSIS — R2681 Unsteadiness on feet: Secondary | ICD-10-CM | POA: Diagnosis not present

## 2019-05-05 DIAGNOSIS — R2681 Unsteadiness on feet: Secondary | ICD-10-CM | POA: Diagnosis not present

## 2019-05-05 DIAGNOSIS — Z9181 History of falling: Secondary | ICD-10-CM | POA: Diagnosis not present

## 2019-05-05 DIAGNOSIS — M6281 Muscle weakness (generalized): Secondary | ICD-10-CM | POA: Diagnosis not present

## 2019-05-08 DIAGNOSIS — Z9181 History of falling: Secondary | ICD-10-CM | POA: Diagnosis not present

## 2019-05-08 DIAGNOSIS — R2681 Unsteadiness on feet: Secondary | ICD-10-CM | POA: Diagnosis not present

## 2019-05-08 DIAGNOSIS — M6281 Muscle weakness (generalized): Secondary | ICD-10-CM | POA: Diagnosis not present

## 2019-05-09 DIAGNOSIS — Z9181 History of falling: Secondary | ICD-10-CM | POA: Diagnosis not present

## 2019-05-09 DIAGNOSIS — R2681 Unsteadiness on feet: Secondary | ICD-10-CM | POA: Diagnosis not present

## 2019-05-09 DIAGNOSIS — M6281 Muscle weakness (generalized): Secondary | ICD-10-CM | POA: Diagnosis not present

## 2019-05-11 DIAGNOSIS — R2681 Unsteadiness on feet: Secondary | ICD-10-CM | POA: Diagnosis not present

## 2019-05-11 DIAGNOSIS — Z9181 History of falling: Secondary | ICD-10-CM | POA: Diagnosis not present

## 2019-05-11 DIAGNOSIS — M6281 Muscle weakness (generalized): Secondary | ICD-10-CM | POA: Diagnosis not present

## 2019-05-15 DIAGNOSIS — M6281 Muscle weakness (generalized): Secondary | ICD-10-CM | POA: Diagnosis not present

## 2019-05-15 DIAGNOSIS — Z9181 History of falling: Secondary | ICD-10-CM | POA: Diagnosis not present

## 2019-05-15 DIAGNOSIS — R2681 Unsteadiness on feet: Secondary | ICD-10-CM | POA: Diagnosis not present

## 2019-05-16 DIAGNOSIS — R2681 Unsteadiness on feet: Secondary | ICD-10-CM | POA: Diagnosis not present

## 2019-05-16 DIAGNOSIS — M6281 Muscle weakness (generalized): Secondary | ICD-10-CM | POA: Diagnosis not present

## 2019-05-16 DIAGNOSIS — Z9181 History of falling: Secondary | ICD-10-CM | POA: Diagnosis not present

## 2019-05-18 DIAGNOSIS — R2681 Unsteadiness on feet: Secondary | ICD-10-CM | POA: Diagnosis not present

## 2019-05-18 DIAGNOSIS — Z9181 History of falling: Secondary | ICD-10-CM | POA: Diagnosis not present

## 2019-05-18 DIAGNOSIS — M6281 Muscle weakness (generalized): Secondary | ICD-10-CM | POA: Diagnosis not present

## 2019-05-21 DIAGNOSIS — R2681 Unsteadiness on feet: Secondary | ICD-10-CM | POA: Diagnosis not present

## 2019-05-21 DIAGNOSIS — M6281 Muscle weakness (generalized): Secondary | ICD-10-CM | POA: Diagnosis not present

## 2019-05-21 DIAGNOSIS — Z9181 History of falling: Secondary | ICD-10-CM | POA: Diagnosis not present

## 2019-05-23 DIAGNOSIS — R2681 Unsteadiness on feet: Secondary | ICD-10-CM | POA: Diagnosis not present

## 2019-05-23 DIAGNOSIS — Z9181 History of falling: Secondary | ICD-10-CM | POA: Diagnosis not present

## 2019-05-23 DIAGNOSIS — M6281 Muscle weakness (generalized): Secondary | ICD-10-CM | POA: Diagnosis not present

## 2019-05-30 DIAGNOSIS — Z9181 History of falling: Secondary | ICD-10-CM | POA: Diagnosis not present

## 2019-05-30 DIAGNOSIS — M6281 Muscle weakness (generalized): Secondary | ICD-10-CM | POA: Diagnosis not present

## 2019-05-30 DIAGNOSIS — R2681 Unsteadiness on feet: Secondary | ICD-10-CM | POA: Diagnosis not present

## 2019-06-01 DIAGNOSIS — M6281 Muscle weakness (generalized): Secondary | ICD-10-CM | POA: Diagnosis not present

## 2019-06-01 DIAGNOSIS — Z9181 History of falling: Secondary | ICD-10-CM | POA: Diagnosis not present

## 2019-06-01 DIAGNOSIS — R2681 Unsteadiness on feet: Secondary | ICD-10-CM | POA: Diagnosis not present

## 2019-06-13 DIAGNOSIS — E039 Hypothyroidism, unspecified: Secondary | ICD-10-CM | POA: Diagnosis not present

## 2019-06-13 DIAGNOSIS — J449 Chronic obstructive pulmonary disease, unspecified: Secondary | ICD-10-CM | POA: Diagnosis not present

## 2019-06-13 DIAGNOSIS — Z0000 Encounter for general adult medical examination without abnormal findings: Secondary | ICD-10-CM | POA: Diagnosis not present

## 2019-06-13 DIAGNOSIS — R269 Unspecified abnormalities of gait and mobility: Secondary | ICD-10-CM | POA: Diagnosis not present

## 2019-06-13 DIAGNOSIS — N401 Enlarged prostate with lower urinary tract symptoms: Secondary | ICD-10-CM | POA: Diagnosis not present

## 2019-06-13 DIAGNOSIS — I482 Chronic atrial fibrillation, unspecified: Secondary | ICD-10-CM | POA: Diagnosis not present

## 2019-06-13 DIAGNOSIS — K219 Gastro-esophageal reflux disease without esophagitis: Secondary | ICD-10-CM | POA: Diagnosis not present

## 2019-06-13 DIAGNOSIS — Z1389 Encounter for screening for other disorder: Secondary | ICD-10-CM | POA: Diagnosis not present

## 2019-06-13 DIAGNOSIS — F0390 Unspecified dementia without behavioral disturbance: Secondary | ICD-10-CM | POA: Diagnosis not present

## 2019-06-26 DIAGNOSIS — Z1159 Encounter for screening for other viral diseases: Secondary | ICD-10-CM | POA: Diagnosis not present

## 2019-06-26 DIAGNOSIS — Z20828 Contact with and (suspected) exposure to other viral communicable diseases: Secondary | ICD-10-CM | POA: Diagnosis not present

## 2019-07-17 DIAGNOSIS — Z1159 Encounter for screening for other viral diseases: Secondary | ICD-10-CM | POA: Diagnosis not present

## 2019-07-17 DIAGNOSIS — Z20828 Contact with and (suspected) exposure to other viral communicable diseases: Secondary | ICD-10-CM | POA: Diagnosis not present

## 2019-07-20 DIAGNOSIS — Z20828 Contact with and (suspected) exposure to other viral communicable diseases: Secondary | ICD-10-CM | POA: Diagnosis not present

## 2019-07-20 DIAGNOSIS — Z1159 Encounter for screening for other viral diseases: Secondary | ICD-10-CM | POA: Diagnosis not present

## 2019-08-10 DIAGNOSIS — Z1159 Encounter for screening for other viral diseases: Secondary | ICD-10-CM | POA: Diagnosis not present

## 2019-08-10 DIAGNOSIS — Z20828 Contact with and (suspected) exposure to other viral communicable diseases: Secondary | ICD-10-CM | POA: Diagnosis not present

## 2019-08-14 DIAGNOSIS — Z1159 Encounter for screening for other viral diseases: Secondary | ICD-10-CM | POA: Diagnosis not present

## 2019-08-14 DIAGNOSIS — Z20828 Contact with and (suspected) exposure to other viral communicable diseases: Secondary | ICD-10-CM | POA: Diagnosis not present

## 2019-09-06 ENCOUNTER — Other Ambulatory Visit: Payer: Self-pay

## 2019-09-06 ENCOUNTER — Encounter (HOSPITAL_COMMUNITY): Payer: Self-pay

## 2019-09-06 ENCOUNTER — Inpatient Hospital Stay (HOSPITAL_COMMUNITY)
Admission: EM | Admit: 2019-09-06 | Discharge: 2019-09-14 | DRG: 603 | Disposition: A | Discharge status: Skilled Nursing Facility | Payer: PPO | Attending: Internal Medicine | Admitting: Internal Medicine

## 2019-09-06 ENCOUNTER — Emergency Department (HOSPITAL_COMMUNITY): Payer: PPO

## 2019-09-06 DIAGNOSIS — K219 Gastro-esophageal reflux disease without esophagitis: Secondary | ICD-10-CM | POA: Diagnosis present

## 2019-09-06 DIAGNOSIS — Z20822 Contact with and (suspected) exposure to covid-19: Secondary | ICD-10-CM | POA: Diagnosis present

## 2019-09-06 DIAGNOSIS — Y92009 Unspecified place in unspecified non-institutional (private) residence as the place of occurrence of the external cause: Secondary | ICD-10-CM

## 2019-09-06 DIAGNOSIS — L03211 Cellulitis of face: Secondary | ICD-10-CM | POA: Diagnosis not present

## 2019-09-06 DIAGNOSIS — R220 Localized swelling, mass and lump, head: Secondary | ICD-10-CM | POA: Diagnosis not present

## 2019-09-06 DIAGNOSIS — E871 Hypo-osmolality and hyponatremia: Secondary | ICD-10-CM

## 2019-09-06 DIAGNOSIS — L0291 Cutaneous abscess, unspecified: Secondary | ICD-10-CM | POA: Diagnosis present

## 2019-09-06 DIAGNOSIS — I4891 Unspecified atrial fibrillation: Secondary | ICD-10-CM | POA: Diagnosis not present

## 2019-09-06 DIAGNOSIS — Z79890 Hormone replacement therapy (postmenopausal): Secondary | ICD-10-CM | POA: Diagnosis not present

## 2019-09-06 DIAGNOSIS — T502X5A Adverse effect of carbonic-anhydrase inhibitors, benzothiadiazides and other diuretics, initial encounter: Secondary | ICD-10-CM | POA: Diagnosis present

## 2019-09-06 DIAGNOSIS — R410 Disorientation, unspecified: Secondary | ICD-10-CM | POA: Diagnosis not present

## 2019-09-06 DIAGNOSIS — D649 Anemia, unspecified: Secondary | ICD-10-CM | POA: Diagnosis present

## 2019-09-06 DIAGNOSIS — I428 Other cardiomyopathies: Secondary | ICD-10-CM | POA: Diagnosis present

## 2019-09-06 DIAGNOSIS — J449 Chronic obstructive pulmonary disease, unspecified: Secondary | ICD-10-CM | POA: Diagnosis not present

## 2019-09-06 DIAGNOSIS — L0201 Cutaneous abscess of face: Secondary | ICD-10-CM | POA: Diagnosis present

## 2019-09-06 DIAGNOSIS — Z79899 Other long term (current) drug therapy: Secondary | ICD-10-CM

## 2019-09-06 DIAGNOSIS — K029 Dental caries, unspecified: Secondary | ICD-10-CM | POA: Diagnosis present

## 2019-09-06 DIAGNOSIS — E039 Hypothyroidism, unspecified: Secondary | ICD-10-CM | POA: Diagnosis present

## 2019-09-06 DIAGNOSIS — F0390 Unspecified dementia without behavioral disturbance: Secondary | ICD-10-CM | POA: Diagnosis present

## 2019-09-06 DIAGNOSIS — Z7901 Long term (current) use of anticoagulants: Secondary | ICD-10-CM

## 2019-09-06 DIAGNOSIS — R41841 Cognitive communication deficit: Secondary | ICD-10-CM | POA: Diagnosis not present

## 2019-09-06 DIAGNOSIS — Z87891 Personal history of nicotine dependence: Secondary | ICD-10-CM

## 2019-09-06 DIAGNOSIS — K0889 Other specified disorders of teeth and supporting structures: Secondary | ICD-10-CM | POA: Diagnosis not present

## 2019-09-06 DIAGNOSIS — L0390 Cellulitis, unspecified: Secondary | ICD-10-CM | POA: Diagnosis not present

## 2019-09-06 DIAGNOSIS — R2689 Other abnormalities of gait and mobility: Secondary | ICD-10-CM | POA: Diagnosis not present

## 2019-09-06 DIAGNOSIS — I5032 Chronic diastolic (congestive) heart failure: Secondary | ICD-10-CM | POA: Diagnosis present

## 2019-09-06 DIAGNOSIS — Z8701 Personal history of pneumonia (recurrent): Secondary | ICD-10-CM | POA: Diagnosis not present

## 2019-09-06 DIAGNOSIS — E222 Syndrome of inappropriate secretion of antidiuretic hormone: Secondary | ICD-10-CM | POA: Diagnosis not present

## 2019-09-06 DIAGNOSIS — M6281 Muscle weakness (generalized): Secondary | ICD-10-CM | POA: Diagnosis not present

## 2019-09-06 DIAGNOSIS — R278 Other lack of coordination: Secondary | ICD-10-CM | POA: Diagnosis not present

## 2019-09-06 DIAGNOSIS — M2550 Pain in unspecified joint: Secondary | ICD-10-CM | POA: Diagnosis not present

## 2019-09-06 DIAGNOSIS — Z8249 Family history of ischemic heart disease and other diseases of the circulatory system: Secondary | ICD-10-CM

## 2019-09-06 DIAGNOSIS — R531 Weakness: Secondary | ICD-10-CM | POA: Diagnosis not present

## 2019-09-06 DIAGNOSIS — Z7401 Bed confinement status: Secondary | ICD-10-CM | POA: Diagnosis not present

## 2019-09-06 DIAGNOSIS — I4821 Permanent atrial fibrillation: Secondary | ICD-10-CM | POA: Diagnosis not present

## 2019-09-06 DIAGNOSIS — R2681 Unsteadiness on feet: Secondary | ICD-10-CM | POA: Diagnosis not present

## 2019-09-06 DIAGNOSIS — M1990 Unspecified osteoarthritis, unspecified site: Secondary | ICD-10-CM | POA: Diagnosis present

## 2019-09-06 DIAGNOSIS — R4182 Altered mental status, unspecified: Secondary | ICD-10-CM | POA: Diagnosis not present

## 2019-09-06 DIAGNOSIS — R5381 Other malaise: Secondary | ICD-10-CM | POA: Diagnosis not present

## 2019-09-06 LAB — CBC WITH DIFFERENTIAL/PLATELET
Abs Immature Granulocytes: 0.04 10*3/uL (ref 0.00–0.07)
Basophils Absolute: 0 10*3/uL (ref 0.0–0.1)
Basophils Relative: 0 %
Eosinophils Absolute: 0.1 10*3/uL (ref 0.0–0.5)
Eosinophils Relative: 1 %
HCT: 37.2 % — ABNORMAL LOW (ref 39.0–52.0)
Hemoglobin: 12.4 g/dL — ABNORMAL LOW (ref 13.0–17.0)
Immature Granulocytes: 1 %
Lymphocytes Relative: 16 %
Lymphs Abs: 1.4 10*3/uL (ref 0.7–4.0)
MCH: 33.2 pg (ref 26.0–34.0)
MCHC: 33.3 g/dL (ref 30.0–36.0)
MCV: 99.7 fL (ref 80.0–100.0)
Monocytes Absolute: 0.9 10*3/uL (ref 0.1–1.0)
Monocytes Relative: 11 %
Neutro Abs: 6.1 10*3/uL (ref 1.7–7.7)
Neutrophils Relative %: 71 %
Platelets: 207 10*3/uL (ref 150–400)
RBC: 3.73 MIL/uL — ABNORMAL LOW (ref 4.22–5.81)
RDW: 12.8 % (ref 11.5–15.5)
WBC: 8.5 10*3/uL (ref 4.0–10.5)
nRBC: 0 % (ref 0.0–0.2)

## 2019-09-06 LAB — RAPID URINE DRUG SCREEN, HOSP PERFORMED
Amphetamines: NOT DETECTED
Barbiturates: NOT DETECTED
Benzodiazepines: NOT DETECTED
Cocaine: NOT DETECTED
Opiates: NOT DETECTED
Tetrahydrocannabinol: NOT DETECTED

## 2019-09-06 LAB — COMPREHENSIVE METABOLIC PANEL
ALT: 9 U/L (ref 0–44)
AST: 12 U/L — ABNORMAL LOW (ref 15–41)
Albumin: 3.2 g/dL — ABNORMAL LOW (ref 3.5–5.0)
Alkaline Phosphatase: 60 U/L (ref 38–126)
Anion gap: 11 (ref 5–15)
BUN: 12 mg/dL (ref 8–23)
CO2: 24 mmol/L (ref 22–32)
Calcium: 8.9 mg/dL (ref 8.9–10.3)
Chloride: 98 mmol/L (ref 98–111)
Creatinine, Ser: 1.19 mg/dL (ref 0.61–1.24)
GFR calc Af Amer: >60 mL/min (ref >60)
GFR calc non Af Amer: 53 mL/min — ABNORMAL LOW (ref >60)
Glucose, Bld: 114 mg/dL — ABNORMAL HIGH (ref 70–99)
Potassium: 4 mmol/L (ref 3.5–5.1)
Sodium: 133 mmol/L — ABNORMAL LOW (ref 135–145)
Total Bilirubin: 0.8 mg/dL (ref 0.3–1.2)
Total Protein: 6.9 g/dL (ref 6.5–8.1)

## 2019-09-06 LAB — URINALYSIS, ROUTINE W REFLEX MICROSCOPIC
Bacteria, UA: NONE SEEN
Bilirubin Urine: NEGATIVE
Glucose, UA: NEGATIVE mg/dL
Hgb urine dipstick: NEGATIVE
Ketones, ur: NEGATIVE mg/dL
Leukocytes,Ua: NEGATIVE
Nitrite: NEGATIVE
Protein, ur: 30 mg/dL — AB
Specific Gravity, Urine: 1.019 (ref 1.005–1.030)
pH: 6 (ref 5.0–8.0)

## 2019-09-06 LAB — BRAIN NATRIURETIC PEPTIDE: B Natriuretic Peptide: 311.8 pg/mL — ABNORMAL HIGH (ref 0.0–100.0)

## 2019-09-06 LAB — LACTIC ACID, PLASMA: Lactic Acid, Venous: 1.6 mmol/L (ref 0.5–1.9)

## 2019-09-06 LAB — POC SARS CORONAVIRUS 2 AG -  ED: SARS Coronavirus 2 Ag: NEGATIVE

## 2019-09-06 MED ORDER — DILTIAZEM HCL ER COATED BEADS 120 MG PO CP24
120.0000 mg | ORAL_CAPSULE | Freq: Every day | ORAL | Status: DC
  Administered 2019-09-07 – 2019-09-14 (×8): 120 mg via ORAL
  Filled 2019-09-06 (×8): qty 1

## 2019-09-06 MED ORDER — ACETAMINOPHEN 325 MG PO TABS
650.0000 mg | ORAL_TABLET | Freq: Four times a day (QID) | ORAL | Status: DC | PRN
  Administered 2019-09-07 – 2019-09-11 (×3): 650 mg via ORAL
  Filled 2019-09-06 (×3): qty 2

## 2019-09-06 MED ORDER — PIPERACILLIN-TAZOBACTAM 3.375 G IVPB 30 MIN
3.3750 g | Freq: Once | INTRAVENOUS | Status: AC
  Administered 2019-09-07: 3.375 g via INTRAVENOUS
  Filled 2019-09-06: qty 50

## 2019-09-06 MED ORDER — RIVAROXABAN 15 MG PO TABS: 15.0000 mg | ORAL_TABLET | Freq: Every day | ORAL | Status: DC

## 2019-09-06 MED ORDER — TAMSULOSIN HCL 0.4 MG PO CAPS
0.4000 mg | ORAL_CAPSULE | Freq: Every day | ORAL | Status: DC
  Administered 2019-09-07 – 2019-09-14 (×8): 0.4 mg via ORAL
  Filled 2019-09-06 (×8): qty 1

## 2019-09-06 MED ORDER — LORATADINE 10 MG PO TABS: 10.0000 mg | ORAL_TABLET | Freq: Every day | ORAL | Status: DC | PRN

## 2019-09-06 MED ORDER — ACETAMINOPHEN 650 MG RE SUPP: 650.0000 mg | Freq: Four times a day (QID) | RECTAL | Status: DC | PRN

## 2019-09-06 MED ORDER — CALCIUM CARBONATE-VITAMIN D 500-200 MG-UNIT PO TABS
1.0000 | ORAL_TABLET | Freq: Every day | ORAL | Status: DC
  Administered 2019-09-07 – 2019-09-14 (×8): 1 via ORAL
  Filled 2019-09-06 (×9): qty 1

## 2019-09-06 MED ORDER — VITAMIN B-12 1000 MCG PO TABS
500.0000 ug | ORAL_TABLET | ORAL | Status: DC
  Administered 2019-09-07: 500 ug via ORAL
  Filled 2019-09-06: qty 1

## 2019-09-06 MED ORDER — ESCITALOPRAM OXALATE 10 MG PO TABS
5.0000 mg | ORAL_TABLET | Freq: Every day | ORAL | Status: DC
  Administered 2019-09-07 – 2019-09-14 (×8): 5 mg via ORAL
  Filled 2019-09-06 (×9): qty 1

## 2019-09-06 MED ORDER — DARIFENACIN HYDROBROMIDE ER 15 MG PO TB24
15.0000 mg | ORAL_TABLET | Freq: Every day | ORAL | Status: DC
  Administered 2019-09-07 – 2019-09-14 (×8): 15 mg via ORAL
  Filled 2019-09-06 (×8): qty 1

## 2019-09-06 MED ORDER — ALBUTEROL SULFATE HFA 108 (90 BASE) MCG/ACT IN AERS: 1.0000 | INHALATION_SPRAY | Freq: Four times a day (QID) | RESPIRATORY_TRACT | Status: DC | PRN

## 2019-09-06 MED ORDER — LEVOTHYROXINE SODIUM 75 MCG PO TABS
75.0000 ug | ORAL_TABLET | Freq: Every day | ORAL | Status: DC
  Administered 2019-09-07 – 2019-09-14 (×8): 75 ug via ORAL
  Filled 2019-09-06 (×8): qty 1

## 2019-09-06 MED ORDER — PANTOPRAZOLE SODIUM 40 MG PO TBEC
40.0000 mg | DELAYED_RELEASE_TABLET | Freq: Every day | ORAL | Status: DC
  Administered 2019-09-07 – 2019-09-14 (×8): 40 mg via ORAL
  Filled 2019-09-06 (×8): qty 1

## 2019-09-06 MED ORDER — DONEPEZIL HCL 10 MG PO TABS
10.0000 mg | ORAL_TABLET | Freq: Every day | ORAL | Status: DC
  Administered 2019-09-07 – 2019-09-13 (×7): 10 mg via ORAL
  Filled 2019-09-06 (×8): qty 1

## 2019-09-06 NOTE — ED Provider Notes (Signed)
[facility] EMERGENCY DEPARTMENT Provider Note   CSN: [id number] Arrival date & time: 09/06/19  2034     History Chief Complaint  Patient presents with  . Dental Pain    Cameron [name] is a 86 y.o. male.  HPI    Patient presents from his nursing facility with staff and family concern of fatigue, and new facial redness.  Patient himself has dementia, but does seem to answer questions about his current situation appropriately, albeit briefly.  Patient acknowledges redness, pain in his left cheek.  Is unclear if he has had anything else such as fever, fall, syncope, nausea, vomiting recently. Level 5 caveat secondary to dementia.  After the initial evaluation I discussed the patient's recent condition with his daughter-in-law, one of our cardiology colleagues.  She notes that staff at the nursing facility has been recently concerned with the patient's decline from baseline, and then with today's noticeable change in his face he was sent here for evaluation. Past Medical History:  Diagnosis Date  . Arthritis   . CHF (congestive heart failure) ([place])   . COPD (chronic obstructive pulmonary disease) ([place])   . Dysrhythmia    atrial fibrilation  . GERD (gastroesophageal reflux disease)     Patient Active Problem List   Diagnosis Date Noted  . Anticoagulated 05/06/2017  . Spells of decreased attentiveness 04/01/2017  . Community acquired pneumonia   . Chronic diastolic heart failure ([place])   . Physical deconditioning   . Influenza-like illness 05/27/2015  . Atrial fibrillation ([place]) 08/23/2013  . CAP (community acquired pneumonia) 07/17/2013  . Dementia ([place]) 07/12/2013  . Weakness generalized 07/12/2013  . Diarrhea 07/12/2013  . Protein-calorie malnutrition, severe ([place]) 03/30/2013  . PNA (pneumonia) 03/28/2013  . Fever 03/27/2013  . Hypotension 03/27/2013  . Sepsis ([place]) 03/26/2013  . FUO (fever of unknown origin) 03/26/2013  . NICM (nonischemic  cardiomyopathy) EF 30% 03/26/2013  . Chronic diastolic HF (heart failure) ([place]) 03/26/2013  . Persistent atrial fibrillation ([place]) 10/04/2012  . Acute systolic heart failure ([place]) 10/04/2012  . ALLERGIC RHINITIS 03/15/2007  . BRONCHIECTASIS, W/O ACUTE EXACERBATION 03/15/2007  . PULMONARY FIBROSIS, POSTINFLAMMATORY 03/15/2007    Past Surgical History:  Procedure Laterality Date  . hemmroidectmy    . HERNIA REPAIR    . LEFT HEART CATHETERIZATION WITH CORONARY ANGIOGRAM N/A 10/07/2012   Procedure: LEFT HEART CATHETERIZATION WITH CORONARY ANGIOGRAM, possible PCI;  Surgeon: Jettie Booze, MD;  Location: [facility] CATH LAB;  Service: Cardiovascular;  Laterality: N/A;  . TONSILLECTOMY         Family History  Problem Relation Age of Onset  . Heart attack Mother   . Pulmonary embolism Father     Social History   Tobacco Use  . Smoking status: Former Smoker    Packs/day: 2.00    Years: 40.00    Pack years: 80.00    Types: Cigarettes    Quit date: 06/29/1961    Years since quitting: 58.2  . Smokeless tobacco: Never Used  Substance Use Topics  . Alcohol use: Yes    Alcohol/week: 2.0 standard drinks    Types: 2 Glasses of wine per week    Comment: occasional  . Drug use: No    Home Medications Prior to Admission medications   Medication Sig Start Date End Date Taking? Authorizing Provider  acetaminophen (TYLENOL) 325 MG tablet Take 650 mg by mouth every 6 (six) hours as needed.   Yes [provider]  albuterol (PROVENTIL HFA;VENTOLIN HFA) 108 (  90 BASE) MCG/ACT inhaler Inhale 1-2 puffs into the lungs every 6 (six) hours as needed for wheezing or shortness of breath.   Yes [provider]  calcium-vitamin D (OSCAL WITH D) 500-200 MG-UNIT tablet Take 1 tablet by mouth daily.    Yes [provider]  diltiazem (CARDIZEM CD) 120 MG 24 hr capsule Take 120 mg by mouth daily.   Yes [provider]  donepezil (ARICEPT) 10 MG tablet Take 1 tablet (10 mg  total) by mouth at bedtime. 10/09/12  Yes Norins, Heinz Knuckles, MD  escitalopram (LEXAPRO) 10 MG tablet Take 5 mg by mouth daily.   Yes [provider]  fexofenadine (ALLEGRA) 180 MG tablet Take 180 mg by mouth daily as needed for allergies or rhinitis.   Yes [provider]  furosemide (LASIX) 20 MG tablet Take 20 mg by mouth 3 (three) times a week. Take one tablet by mouth every Monday, Wednesday and Friday.   Yes [provider]  levothyroxine (SYNTHROID, LEVOTHROID) 75 MCG tablet ONE TABLET DAILY BEFORE BREAKFAST Patient taking differently: Take 75 mcg by mouth daily before breakfast.    Yes Norins, Heinz Knuckles, MD  loperamide (ANTI-DIARRHEAL) 2 MG capsule Take 2 mg by mouth as needed for diarrhea or loose stools.   Yes [provider]  omeprazole (PRILOSEC OTC) 20 MG tablet Take 20 mg by mouth daily.   Yes [provider]  PRESCRIPTION MEDICATION 10 mLs by Mouth Rinse route in the morning and at bedtime. CLOSYS Rinse 0.25% ; use after brushing teeth every morning, and evening. Do not swallow.   Yes [provider]  Rivaroxaban (XARELTO) 15 MG TABS tablet Take 1 tablet (15 mg total) by mouth daily with supper. 08/23/13  Yes Jettie Booze, MD  solifenacin (VESICARE) 10 MG tablet Take 10 mg by mouth every morning.    Yes [provider]  tamsulosin (FLOMAX) 0.4 MG CAPS capsule Take 1 capsule daily by mouth. 04/15/17  Yes [provider]  vitamin B-12 (CYANOCOBALAMIN) 500 MCG tablet Take 500 mcg by mouth 2 (two) times a week. Mon and Thurs   Yes [provider]  erythromycin ophthalmic ointment Place 1 application into the left eye 4 (four) times daily. Apply to skin vesicles on eyelid and forehead Patient not taking: Reported on 09/06/2019 03/24/19   Bernarda Caffey, MD    Allergies    Patient has no known allergies.  Review of Systems   Review of Systems  Unable to perform ROS: Dementia    Physical Exam Updated  Vital Signs BP (!) 145/60   Pulse 83   Temp 98 F (36.7 C) (Oral)   Ht 5' 10.5" (1.791 m)   Wt 74.8 kg   SpO2 100%   BMI 23.34 kg/m   Physical Exam Vitals and nursing note reviewed.  Constitutional:      General: He is not in acute distress.    Appearance: He is well-developed.  HENT:     Head: Normocephalic and atraumatic.      Mouth/Throat:   Eyes:     Conjunctiva/sclera: Conjunctivae normal.  Cardiovascular:     Rate and Rhythm: Normal rate and regular rhythm.  Pulmonary:     Effort: Pulmonary effort is normal. No respiratory distress.     Breath sounds: No stridor.  Abdominal:     General: There is no distension.  Skin:    General: Skin is warm and dry.       Neurological:  Mental Status: He is alert.     Motor: Atrophy present.  Psychiatric:        Behavior: Behavior is slowed and withdrawn.        Cognition and Memory: Cognition is impaired.     ED Results / Procedures / Treatments   Labs (all labs ordered are listed, but only abnormal results are displayed) Labs Reviewed  COMPREHENSIVE METABOLIC PANEL - Abnormal; Notable for the following components:      Result Value   Sodium 133 (*)    Glucose, Bld 114 (*)    Albumin 3.2 (*)    AST 12 (*)    GFR calc non Af Amer 53 (*)    All other components within normal limits  CBC WITH DIFFERENTIAL/PLATELET - Abnormal; Notable for the following components:   RBC 3.73 (*)    Hemoglobin 12.4 (*)    HCT 37.2 (*)    All other components within normal limits  URINALYSIS, ROUTINE W REFLEX MICROSCOPIC - Abnormal; Notable for the following components:   APPearance HAZY (*)    Protein, ur 30 (*)    All other components within normal limits  BRAIN NATRIURETIC PEPTIDE - Abnormal; Notable for the following components:   B Natriuretic Peptide 311.8 (*)    All other components within normal limits  LACTIC ACID, PLASMA  RAPID URINE DRUG SCREEN, HOSP PERFORMED  POC SARS CORONAVIRUS 2 AG -  ED    Radiology CT  Head Wo Contrast  Result Date: 09/06/2019 CLINICAL DATA:  86 year old male with ataxia. Left facial swelling. EXAM: CT HEAD WITHOUT CONTRAST TECHNIQUE: Contiguous axial images were obtained from the base of the skull through the vertex without intravenous contrast. COMPARISON:  Brain MRI 04/07/2017. Head CT 03/22/2019. FINDINGS: Brain: Stable cerebral volume. No ventriculomegaly. No midline shift, mass effect, or evidence of intracranial mass lesion. No acute intracranial hemorrhage identified. Confluent bilateral white matter hypodensity appears stable. But hypodensity in the bilateral basal ganglia appears mildly progressed since September. No superimposed acute cortically based infarct identified. Vascular: Calcified atherosclerosis at the skull base. No suspicious intracranial vascular hyperdensity. Skull: Carious dentition, cortical breakthrough at the anterior left maxillary molar. See additional details below. No acute fracture identified. Sinuses/Orbits: Visualized paranasal sinuses and mastoids are stable and well pneumatized. There is mild left maxillary alveolar recess mucosal thickening. Other: Partially visible asymmetric left anterior face, premalar soft tissue swelling and stranding (series 4, image 29). Furthermore, better demonstrated on coronal images there is confluent phlegmon like opacity with trace soft tissue gas lateral to a carious left upper maxillary tooth with evidence of cortical breakthrough (series 5, image 7). Orbit and scalp soft tissues appear negative. IMPRESSION: 1. Evidence of Odontogenic Left Facial Cellulitis. Carious left maxillary molar with associated maxillary alveolus cortical breakthrough and regional left face phlegmon with trace soft tissue gas. 2. Evidence of cerebral small vessel disease which appears mildly progressed in the bilateral basal ganglia since September. 3. Otherwise no acute intracranial abnormality. Electronically Signed   By: Genevie Ann M.D.   On:  09/06/2019 22:44    Procedures Procedures (including critical care time)  Medications Ordered in ED Medications  piperacillin-tazobactam (ZOSYN) IVPB 3.375 g (has no administration in time range)    ED Course  I have reviewed the triage vital signs and the nursing notes.  Pertinent labs & imaging results that were available during my care of the patient were reviewed by me and considered in my medical decision making (see chart for details).  On  repeat exam patient is in similar condition.  I discussed need for admission with him, and subsequently with his daughter-in-law via telephone. This elderly male presents with recent decline in condition, is found to have evidence for facial cellulitis with likely odontologic etiology.  Patient is awake and alert, with no leukocytosis, no lactic acidosis, low suspicion for bacteremia, sepsis but, but given the location of his infection, concern for progression given his comorbidities, he was admitted for further monitoring, management.  Final Clinical Impression(s) / ED Diagnoses Final diagnoses:  Facial cellulitis     Carmin Muskrat, MD 09/06/19 [id number]

## 2019-09-06 NOTE — ED Triage Notes (Signed)
Pt bib [facility] reports left sided upper tooth pain x days. VS stable

## 2019-09-06 NOTE — H&P (Signed)
History and Physical    Cameron [name] [id number] DOB: [birth year] DOA: 09/06/2019  PCP: Lavone Orn, MD  Chief Complaint: Tooth pain  HPI: Cameron [name] is a 86 y.o. male with medical history significant of dementia, nonischemic cardiomyopathy, COPD, A. fib on Xarelto, GERD, arthritis, hypothyroidism presented to the ED via EMS for evaluation of left-sided upper tooth pain.  Patient reports having slight discomfort in his tooth.  He is not sure when this problem started.  Denies history of any recent dental procedures.  Denies fevers, chills, throat pain, difficulty swallowing, or shortness of breath.  No other complaints.  ED Course: Vital signs stable.  Labs showing no leukocytosis.  Lactic acid normal.  Hemoglobin 12.4, previously in the 13 range.  Sodium mildly low at 133.  SARS-CoV-2 rapid antigen test negative.  BNP 311. Head CT negative for acute intracranial abnormality.  Showing evidence of odontogenic left facial cellulitis.  Carious left maxillary molar with associated maxillary alveolus cortical breakthrough and regional left face phlegmon with trace soft tissue gas.  Patient received Zosyn.  Review of Systems:  All systems reviewed and apart from history of presenting illness, are negative.  Past Medical History:  Diagnosis Date  . Arthritis   . CHF (congestive heart failure) ([place])   . COPD (chronic obstructive pulmonary disease) ([place])   . Dysrhythmia    atrial fibrilation  . GERD (gastroesophageal reflux disease)     Past Surgical History:  Procedure Laterality Date  . hemmroidectmy    . HERNIA REPAIR    . LEFT HEART CATHETERIZATION WITH CORONARY ANGIOGRAM N/A 10/07/2012   Procedure: LEFT HEART CATHETERIZATION WITH CORONARY ANGIOGRAM, possible PCI;  Surgeon: Jettie Booze, MD;  Location: [facility] CATH LAB;  Service: Cardiovascular;  Laterality: N/A;  . TONSILLECTOMY       reports that he quit smoking about 58 years ago. His smoking use included cigarettes.  He has a 80.00 pack-year smoking history. He has never used smokeless tobacco. He reports current alcohol use of about 2.0 standard drinks of alcohol per week. He reports that he does not use drugs.  No Known Allergies  Family History  Problem Relation Age of Onset  . Heart attack Mother   . Pulmonary embolism Father     Prior to Admission medications   Medication Sig Start Date End Date Taking? Authorizing Provider  acetaminophen (TYLENOL) 325 MG tablet Take 650 mg by mouth every 6 (six) hours as needed.   Yes [provider]  albuterol (PROVENTIL HFA;VENTOLIN HFA) 108 (90 BASE) MCG/ACT inhaler Inhale 1-2 puffs into the lungs every 6 (six) hours as needed for wheezing or shortness of breath.   Yes [provider]  calcium-vitamin D (OSCAL WITH D) 500-200 MG-UNIT tablet Take 1 tablet by mouth daily.    Yes [provider]  diltiazem (CARDIZEM CD) 120 MG 24 hr capsule Take 120 mg by mouth daily.   Yes [provider]  donepezil (ARICEPT) 10 MG tablet Take 1 tablet (10 mg total) by mouth at bedtime. 10/09/12  Yes Norins, Heinz Knuckles, MD  escitalopram (LEXAPRO) 10 MG tablet Take 5 mg by mouth daily.   Yes [provider]  fexofenadine (ALLEGRA) 180 MG tablet Take 180 mg by mouth daily as needed for allergies or rhinitis.   Yes [provider]  furosemide (LASIX) 20 MG tablet Take 20 mg by mouth 3 (three) times a week. Take one tablet by mouth every Monday, Wednesday and Friday.   Yes [provider]  levothyroxine (SYNTHROID, LEVOTHROID) 75 MCG tablet ONE TABLET DAILY BEFORE BREAKFAST Patient taking differently: Take 75 mcg by mouth daily before breakfast.    Yes Norins, Heinz Knuckles, MD  loperamide (ANTI-DIARRHEAL) 2 MG capsule Take 2 mg by mouth as needed for diarrhea or loose stools.   Yes [provider]  omeprazole (PRILOSEC OTC) 20 MG tablet Take 20 mg by mouth daily.   Yes [provider]  PRESCRIPTION MEDICATION  10 mLs by Mouth Rinse route in the morning and at bedtime. CLOSYS Rinse 0.25% ; use after brushing teeth every morning, and evening. Do not swallow.   Yes [provider]  Rivaroxaban (XARELTO) 15 MG TABS tablet Take 1 tablet (15 mg total) by mouth daily with supper. 08/23/13  Yes Jettie Booze, MD  solifenacin (VESICARE) 10 MG tablet Take 10 mg by mouth every morning.    Yes [provider]  tamsulosin (FLOMAX) 0.4 MG CAPS capsule Take 1 capsule daily by mouth. 04/15/17  Yes [provider]  vitamin B-12 (CYANOCOBALAMIN) 500 MCG tablet Take 500 mcg by mouth 2 (two) times a week. Mon and Thurs   Yes [provider]  erythromycin ophthalmic ointment Place 1 application into the left eye 4 (four) times daily. Apply to skin vesicles on eyelid and forehead Patient not taking: Reported on 09/06/2019 03/24/19   Bernarda Caffey, MD    Physical Exam: Vitals:   09/06/19 2130 09/06/19 2200 09/06/19 2330 09/07/19 0013  BP: (!) 142/72 (!) 145/60 (!) 142/75 (!) 150/61  Pulse: 69 83 81 70  Resp:    17  Temp:      TempSrc:      SpO2: 98% 100% 97% 96%  Weight:      Height:        Physical Exam  Constitutional: He is oriented to person, place, and time. He appears well-developed and well-nourished. No distress.  HENT:  Head: Normocephalic.  Left upper molar gum swelling Left-sided facial erythema and swelling  Eyes: Right eye exhibits no discharge. Left eye exhibits no discharge.  Cardiovascular: Normal rate, regular rhythm and intact distal pulses.  Pulmonary/Chest: Effort normal and breath sounds normal. No respiratory distress. He has no wheezes. He has no rales.  Abdominal: Soft. Bowel sounds are normal. He exhibits no distension. There is no abdominal tenderness. There is no guarding.  Musculoskeletal:     Cervical back: Neck supple.     Comments: +1 pitting edema bilateral lower extremities  Neurological: He is alert and oriented to person, place, and  time.  Skin: Skin is warm and dry. He is not diaphoretic.   Labs on Admission: I have personally reviewed following labs and imaging studies  CBC: Recent Labs  Lab 09/06/19 2210  WBC 8.5  NEUTROABS 6.1  HGB 12.4*  HCT 37.2*  MCV 99.7  PLT [id number]   Basic Metabolic Panel: Recent Labs  Lab 09/06/19 2210  NA 133*  K 4.0  CL 98  CO2 24  GLUCOSE 114*  BUN 12  CREATININE 1.19  CALCIUM 8.9   GFR: Estimated Creatinine Clearance: 42.4 mL/min (by C-G formula based on SCr of 1.19 mg/dL). Liver Function Tests: Recent Labs  Lab 09/06/19 2210  AST 12*  ALT 9  ALKPHOS 60  BILITOT 0.8  PROT 6.9  ALBUMIN 3.2*   No results for input(s): LIPASE, AMYLASE in the last 168 hours. No results for input(s): AMMONIA in the last 168 hours. Coagulation Profile: No results for input(s): INR, PROTIME in  the last 168 hours. Cardiac Enzymes: No results for input(s): CKTOTAL, CKMB, CKMBINDEX, TROPONINI in the last 168 hours. BNP (last 3 results) No results for input(s): PROBNP in the last 8760 hours. HbA1C: No results for input(s): HGBA1C in the last 72 hours. CBG: No results for input(s): GLUCAP in the last 168 hours. Lipid Profile: No results for input(s): CHOL, HDL, LDLCALC, TRIG, CHOLHDL, LDLDIRECT in the last 72 hours. Thyroid Function Tests: No results for input(s): TSH, T4TOTAL, FREET4, T3FREE, THYROIDAB in the last 72 hours. Anemia Panel: No results for input(s): VITAMINB12, FOLATE, FERRITIN, TIBC, IRON, RETICCTPCT in the last 72 hours. Urine analysis:    Component Value Date/Time   COLORURINE YELLOW 09/06/2019 2149   APPEARANCEUR HAZY (A) 09/06/2019 2149   LABSPEC 1.019 09/06/2019 2149   PHURINE 6.0 09/06/2019 2149   GLUCOSEU NEGATIVE 09/06/2019 2149   HGBUR NEGATIVE 09/06/2019 2149   BILIRUBINUR NEGATIVE 09/06/2019 2149   [place] NEGATIVE 09/06/2019 2149   PROTEINUR 30 (A) 09/06/2019 2149   UROBILINOGEN 0.2 07/12/2013 2230   NITRITE NEGATIVE 09/06/2019 2149    LEUKOCYTESUR NEGATIVE 09/06/2019 2149    Radiological Exams on Admission: CT Head Wo Contrast  Result Date: 09/06/2019 CLINICAL DATA:  86 year old male with ataxia. Left facial swelling. EXAM: CT HEAD WITHOUT CONTRAST TECHNIQUE: Contiguous axial images were obtained from the base of the skull through the vertex without intravenous contrast. COMPARISON:  Brain MRI 04/07/2017. Head CT 03/22/2019. FINDINGS: Brain: Stable cerebral volume. No ventriculomegaly. No midline shift, mass effect, or evidence of intracranial mass lesion. No acute intracranial hemorrhage identified. Confluent bilateral white matter hypodensity appears stable. But hypodensity in the bilateral basal ganglia appears mildly progressed since September. No superimposed acute cortically based infarct identified. Vascular: Calcified atherosclerosis at the skull base. No suspicious intracranial vascular hyperdensity. Skull: Carious dentition, cortical breakthrough at the anterior left maxillary molar. See additional details below. No acute fracture identified. Sinuses/Orbits: Visualized paranasal sinuses and mastoids are stable and well pneumatized. There is mild left maxillary alveolar recess mucosal thickening. Other: Partially visible asymmetric left anterior face, premalar soft tissue swelling and stranding (series 4, image 29). Furthermore, better demonstrated on coronal images there is confluent phlegmon like opacity with trace soft tissue gas lateral to a carious left upper maxillary tooth with evidence of cortical breakthrough (series 5, image 7). Orbit and scalp soft tissues appear negative. IMPRESSION: 1. Evidence of Odontogenic Left Facial Cellulitis. Carious left maxillary molar with associated maxillary alveolus cortical breakthrough and regional left face phlegmon with trace soft tissue gas. 2. Evidence of cerebral small vessel disease which appears mildly progressed in the bilateral basal ganglia since September. 3. Otherwise no  acute intracranial abnormality. Electronically Signed   By: Genevie Ann M.D.   On: 09/06/2019 22:44    Assessment/Plan Principal Problem:   Facial cellulitis Active Problems:   Dementia (HCC)   Atrial fibrillation (HCC)   Anemia   Hyponatremia   Odontogenic left facial cellulitis: No signs of airway compromise.  No fever, leukocytosis, or signs of sepsis.  Lactic acid normal.  CT showing evidence of odontogenic left facial cellulitis.  Carious left maxillary molar with associated maxillary alveolus cortical breakthrough and regional left face phlegmon with trace soft tissue gas.  Plan is to continue antibiotic coverage with Zosyn.  Consult oral surgery in a.m.  Mild normocytic anemia: No signs of active bleeding.  Check anemia panel and FOBT.  Mild hyponatremia: Likely related to home diuretic use.  Hold diuretic at this time and repeat BMP in a.m.  Nonischemic cardiomyopathy: No significant elevation of BNP.  Has mild peripheral edema but lungs clear on exam and no signs of respiratory distress.  Hold diuretic at this time.   Dementia: Continue home Aricept  COPD: Stable.  No wheezing, cough, or dyspnea.  Order DuoNebs as needed.  A. Fib: Stable.  Continue Cardizem for rate control.  Last dose of Xarelto was 3/10, will hold at this time until patient is seen by an oral surgeon in the morning.  If no plan for surgery/dental procedure, resume anticoagulation.  Hypothyroidism: Continue home Synthroid  DVT prophylaxis: On chronic anticoagulation with Xarelto. Code Status: Patient wishes to be full code. Family Communication: No family at bedside. Disposition Plan: Anticipate discharge after clinical improvement. Admission status: It is my clinical opinion that admission to INPATIENT is reasonable and necessary because of the expectation that this patient will require hospital care that crosses at least 2 midnights to treat this condition based on the medical complexity of the problems  presented.  Given the aforementioned information, the predictability of an adverse outcome is felt to be significant.  The medical decision making on this patient was of high complexity and the patient is at high risk for clinical deterioration, therefore this is a level 3 visit.  Shela Leff MD [facility]  If 7PM-7AM, please contact night-coverage [email or web address]  09/07/2019, 12:16 AM

## 2019-09-06 NOTE — ED Notes (Signed)
Three unsuccessful IV attempts. Co-worker is attempting a line now.

## 2019-09-07 DIAGNOSIS — D649 Anemia, unspecified: Secondary | ICD-10-CM | POA: Insufficient documentation

## 2019-09-07 DIAGNOSIS — E871 Hypo-osmolality and hyponatremia: Secondary | ICD-10-CM

## 2019-09-07 LAB — CBG MONITORING, ED: Glucose-Capillary: 107 mg/dL — ABNORMAL HIGH (ref 70–99)

## 2019-09-07 LAB — RETICULOCYTES
Immature Retic Fract: 11.3 % (ref 2.3–15.9)
RBC.: 3.5 MIL/uL — ABNORMAL LOW (ref 4.22–5.81)
Retic Count, Absolute: 64.4 10*3/uL (ref 19.0–186.0)
Retic Ct Pct: 1.8 % (ref 0.4–3.1)

## 2019-09-07 LAB — FERRITIN: Ferritin: 218 ng/mL (ref 24–336)

## 2019-09-07 LAB — IRON AND TIBC
Iron: 42 ug/dL — ABNORMAL LOW (ref 45–182)
Saturation Ratios: 24 % (ref 17.9–39.5)
TIBC: 176 ug/dL — ABNORMAL LOW (ref 250–450)
UIBC: 134 ug/dL

## 2019-09-07 LAB — FOLATE: Folate: 10.2 ng/mL (ref >5.9)

## 2019-09-07 LAB — VITAMIN B12: Vitamin B-12: 181 pg/mL (ref 180–914)

## 2019-09-07 MED ORDER — IPRATROPIUM-ALBUTEROL 0.5-2.5 (3) MG/3ML IN SOLN: 3.0000 mL | Freq: Four times a day (QID) | RESPIRATORY_TRACT | Status: DC | PRN

## 2019-09-07 MED ORDER — VITAMIN B-12 1000 MCG PO TABS
1000.0000 ug | ORAL_TABLET | Freq: Every day | ORAL | Status: DC
  Administered 2019-09-08 – 2019-09-14 (×7): 1000 ug via ORAL
  Filled 2019-09-07 (×7): qty 1

## 2019-09-07 MED ORDER — RIVAROXABAN 15 MG PO TABS
15.0000 mg | ORAL_TABLET | Freq: Every day | ORAL | Status: DC
  Administered 2019-09-08 – 2019-09-13 (×6): 15 mg via ORAL
  Filled 2019-09-07 (×8): qty 1

## 2019-09-07 MED ORDER — PIPERACILLIN-TAZOBACTAM 3.375 G IVPB
3.3750 g | Freq: Three times a day (TID) | INTRAVENOUS | Status: AC
  Administered 2019-09-07 – 2019-09-10 (×12): 3.375 g via INTRAVENOUS
  Filled 2019-09-07 (×12): qty 50

## 2019-09-07 NOTE — Progress Notes (Signed)
Pharmacy Antibiotic Note  Cameron [name] is a 86 y.o. male admitted on 09/06/2019 with cellulitis.  Pharmacy has been consulted for Zosyn dosing. Facial cellulitis. WBC WNL. Renal function ok.   Plan: Zosyn 3.375G IV q8h to be infused over 4 hours Trend WBC, temp, renal function  F/U infectious work-up  Height: 5' 10.5" (179.1 cm) Weight: 165 lb (74.8 kg) IBW/kg (Calculated) : 74.15  Temp (24hrs), Avg:98 F (36.7 C), Min:98 F (36.7 C), Max:98 F (36.7 C)  Recent Labs  Lab 09/06/19 2210 09/06/19 2211  WBC 8.5  --   CREATININE 1.19  --   LATICACIDVEN  --  1.6    Estimated Creatinine Clearance: 42.4 mL/min (by C-G formula based on SCr of 1.19 mg/dL).    No Known Allergies  Narda Bonds, PharmD, BCPS Clinical Pharmacist Phone: [phone]

## 2019-09-07 NOTE — Progress Notes (Addendum)
Progress Note    Cameron [name]  [id number] DOB: Feb 27, 1926  DOA: 09/06/2019 PCP: Lavone Orn, MD    Brief Narrative:     Medical records reviewed and are as summarized below:  Cameron [name] is an 86 y.o. male from [place].  He presents with medical history significant of dementia, nonischemic cardiomyopathy, COPD, A. fib on Xarelto, GERD, arthritis, hypothyroidism presented to the ED via EMS for evaluation of left-sided upper tooth pain.  Patient reports having slight discomfort in his tooth.  He is not sure when this problem started.  Denies history of any recent dental procedures.  Assessment/Plan:   Principal Problem:   Facial cellulitis Active Problems:   Dementia (HCC)   Atrial fibrillation (HCC)   Hyponatremia    Odontogenic left facial cellulitis:  -No signs of airway compromise.   - CT showing evidence of odontogenic left facial cellulitis and Carious left maxillary molar with associated maxillary alveolus cortical breakthrough and regional left face phlegmon with trace soft tissue gas.  - Plan is to continue antibiotic coverage with Zosyn.   -Currently there is no oral surgeon on call so I have consulted Dr. Haig Prophet, dentist.  Spoke with him this a.m. he states he will review the CT scan and get back to me with recommendations. -Not sure patient would be able to follow-up in the office for extraction, I suspect he will need to be seen here in the hospital  Mild hyponatremia:  Repeat labs in the a.m.  Nonischemic cardiomyopathy:  -No significant elevation of BNP.  Has mild peripheral edema but lungs clear on exam and no signs of respiratory distress.   -Laying flat on side in bed   Dementia:  -Continue home Aricept  COPD:  -Stable.  No wheezing, cough, or dyspnea.  Order DuoNebs as needed.  A. Fib: Permanent -Stable.   -Continue Cardizem for rate control.   -Last dose of Xarelto was 3/10, will hold at this time until patient is seen by  an oral surgeon in the morning.  -Once I hear back from the dental services and have a time frame for procedure, may start heparin in place of Xarelto  Hypothyroidism:  Continue home Synthroid    Addendum: Discussed with dentist as well as oral surgeon.  No area that needs to be drained currently-- more acute on chronic issues.  Will plan to monitor on IV abx.  PT/OT consult and monitor for improvement.  Once d/c'd will need follow up for removal of tooth.   Family Communication/Anticipated D/C date and plan/Code Status   DVT prophylaxis: Takes Xarelto at home which is currently on hold Code Status: Full Code.  Family Communication: Spoke with Dr. Fransico Him Disposition Plan: Patient needs to remain in hospital for decision about intervention as well as continued IV antibiotics and monitoring of cellulitis   Medical Consultants:    Dentist, Dr. Haig Prophet   Subjective:   Complaining of jaw pain on the left  Objective:    Vitals:   09/07/19 0228 09/07/19 0329 09/07/19 0803 09/07/19 1034  BP: (!) 133/92 (!) 158/65 128/69 112/65  Pulse: 89 83 75   Resp: 15 14 (!) 21   Temp: 97.9 F (36.6 C) 98.1 F (36.7 C)    TempSrc:  Oral    SpO2: 95% 98% 95%   Weight:      Height:        Intake/Output Summary (Last 24 hours) at 09/07/2019 1207 Last data filed at 09/07/2019 0102  Gross per 24 hour  Intake 50 ml  Output --  Net 50 ml   Filed Weights   09/06/19 2048  Weight: 74.8 kg    Exam: Pleasant and cooperative laying flat on his side in the bed   (Patient granted permission for picture)  Irregular Rhythm but rate controlled Positive bowel sounds soft nontender   Data Reviewed:   I have personally reviewed following labs and imaging studies:  Labs: Labs show the following:   Basic Metabolic Panel: Recent Labs  Lab 09/06/19 2210  NA 133*  K 4.0  CL 98  CO2 24  GLUCOSE 114*  BUN 12  CREATININE 1.19  CALCIUM 8.9   GFR Estimated Creatinine  Clearance: 42.4 mL/min (by C-G formula based on SCr of 1.19 mg/dL). Liver Function Tests: Recent Labs  Lab 09/06/19 2210  AST 12*  ALT 9  ALKPHOS 60  BILITOT 0.8  PROT 6.9  ALBUMIN 3.2*   No results for input(s): LIPASE, AMYLASE in the last 168 hours. No results for input(s): AMMONIA in the last 168 hours. Coagulation profile No results for input(s): INR, PROTIME in the last 168 hours.  CBC: Recent Labs  Lab 09/06/19 2210  WBC 8.5  NEUTROABS 6.1  HGB 12.4*  HCT 37.2*  MCV 99.7  PLT 207   Cardiac Enzymes: No results for input(s): CKTOTAL, CKMB, CKMBINDEX, TROPONINI in the last 168 hours. BNP (last 3 results) No results for input(s): PROBNP in the last 8760 hours. CBG: No results for input(s): GLUCAP in the last 168 hours. D-Dimer: No results for input(s): DDIMER in the last 72 hours. Hgb A1c: No results for input(s): HGBA1C in the last 72 hours. Lipid Profile: No results for input(s): CHOL, HDL, LDLCALC, TRIG, CHOLHDL, LDLDIRECT in the last 72 hours. Thyroid function studies: No results for input(s): TSH, T4TOTAL, T3FREE, THYROIDAB in the last 72 hours.  Invalid input(s): FREET3 Anemia work up: Recent Labs    09/07/19 0340  VITAMINB12 181  FOLATE 10.2  FERRITIN 218  TIBC 176*  IRON 42*  RETICCTPCT 1.8   Sepsis Labs: Recent Labs  Lab 09/06/19 2210 09/06/19 2211  WBC 8.5  --   LATICACIDVEN  --  1.6    Microbiology No results found for this or any previous visit (from the past 240 hour(s)).  Procedures and diagnostic studies:  CT Head Wo Contrast  Result Date: 09/06/2019 CLINICAL DATA:  86 year old male with ataxia. Left facial swelling. EXAM: CT HEAD WITHOUT CONTRAST TECHNIQUE: Contiguous axial images were obtained from the base of the skull through the vertex without intravenous contrast. COMPARISON:  Brain MRI 04/07/2017. Head CT 03/22/2019. FINDINGS: Brain: Stable cerebral volume. No ventriculomegaly. No midline shift, mass effect, or evidence of  intracranial mass lesion. No acute intracranial hemorrhage identified. Confluent bilateral white matter hypodensity appears stable. But hypodensity in the bilateral basal ganglia appears mildly progressed since September. No superimposed acute cortically based infarct identified. Vascular: Calcified atherosclerosis at the skull base. No suspicious intracranial vascular hyperdensity. Skull: Carious dentition, cortical breakthrough at the anterior left maxillary molar. See additional details below. No acute fracture identified. Sinuses/Orbits: Visualized paranasal sinuses and mastoids are stable and well pneumatized. There is mild left maxillary alveolar recess mucosal thickening. Other: Partially visible asymmetric left anterior face, premalar soft tissue swelling and stranding (series 4, image 29). Furthermore, better demonstrated on coronal images there is confluent phlegmon like opacity with trace soft tissue gas lateral to a carious left upper maxillary tooth with evidence of cortical breakthrough (series 5,  image 7). Orbit and scalp soft tissues appear negative. IMPRESSION: 1. Evidence of Odontogenic Left Facial Cellulitis. Carious left maxillary molar with associated maxillary alveolus cortical breakthrough and regional left face phlegmon with trace soft tissue gas. 2. Evidence of cerebral small vessel disease which appears mildly progressed in the bilateral basal ganglia since September. 3. Otherwise no acute intracranial abnormality. Electronically Signed   By: Genevie Ann M.D.   On: 09/06/2019 22:44    Medications:   . calcium-vitamin D  1 tablet Oral Q breakfast  . darifenacin  15 mg Oral Daily  . diltiazem  120 mg Oral Daily  . donepezil  10 mg Oral QHS  . escitalopram  5 mg Oral Daily  . levothyroxine  75 mcg Oral QAC breakfast  . pantoprazole  40 mg Oral Daily  . tamsulosin  0.4 mg Oral Daily  . [START ON 09/08/2019] vitamin B-12  1,000 mcg Oral Daily   Continuous Infusions: .  piperacillin-tazobactam (ZOSYN)  IV 3.375 g (09/07/19 [id number])     LOS: 1 day   Geradine Girt  [facility]   How to contact the [facility] Attending or Consulting provider [place] or covering provider during after hours [place], for this patient?  1. Check the care team in [facility] and look for a) attending/consulting [facility] provider listed and b) the [facility] team listed 2. Log into [email or web address] and use [place] universal password to access. If you do not have the password, please contact the hospital operator. 3. Locate the [facility] provider you are looking for under [facility] and page to a number that you can be directly reached. 4. If you still have difficulty reaching the provider, please page the [facility] (Director on Call) for the Hospitalists listed on amion for assistance.  09/07/2019, 12:07 PM

## 2019-09-07 NOTE — Progress Notes (Signed)
Pt arrived to the unit, not in distress and tolerated well. Vital signs taken, antx still infusing.

## 2019-09-07 NOTE — ED Notes (Signed)
Notified family about transfer to [facility]

## 2019-09-08 LAB — CBC
HCT: 33.3 % — ABNORMAL LOW (ref 39.0–52.0)
Hemoglobin: 11.5 g/dL — ABNORMAL LOW (ref 13.0–17.0)
MCH: 33.8 pg (ref 26.0–34.0)
MCHC: 34.5 g/dL (ref 30.0–36.0)
MCV: 97.9 fL (ref 80.0–100.0)
Platelets: 183 10*3/uL (ref 150–400)
RBC: 3.4 MIL/uL — ABNORMAL LOW (ref 4.22–5.81)
RDW: 12.5 % (ref 11.5–15.5)
WBC: 8 10*3/uL (ref 4.0–10.5)
nRBC: 0 % (ref 0.0–0.2)

## 2019-09-08 LAB — BASIC METABOLIC PANEL
Anion gap: 12 (ref 5–15)
BUN: 9 mg/dL (ref 8–23)
CO2: 23 mmol/L (ref 22–32)
Calcium: 8.5 mg/dL — ABNORMAL LOW (ref 8.9–10.3)
Chloride: 96 mmol/L — ABNORMAL LOW (ref 98–111)
Creatinine, Ser: 1.11 mg/dL (ref 0.61–1.24)
GFR calc Af Amer: >60 mL/min (ref >60)
GFR calc non Af Amer: 58 mL/min — ABNORMAL LOW (ref >60)
Glucose, Bld: 82 mg/dL (ref 70–99)
Potassium: 3.9 mmol/L (ref 3.5–5.1)
Sodium: 131 mmol/L — ABNORMAL LOW (ref 135–145)

## 2019-09-08 MED ORDER — CHLORHEXIDINE GLUCONATE 0.12 % MT SOLN
15.0000 mL | Freq: Two times a day (BID) | OROMUCOSAL | Status: DC
  Administered 2019-09-08 – 2019-09-14 (×13): 15 mL via OROMUCOSAL
  Filled 2019-09-08 (×14): qty 15

## 2019-09-08 MED ORDER — FUROSEMIDE 20 MG PO TABS
20.0000 mg | ORAL_TABLET | ORAL | Status: DC
  Administered 2019-09-08: 20 mg via ORAL
  Filled 2019-09-08: qty 1

## 2019-09-08 MED ORDER — ORAL CARE MOUTH RINSE
15.0000 mL | Freq: Two times a day (BID) | OROMUCOSAL | Status: DC
  Administered 2019-09-08 – 2019-09-13 (×12): 15 mL via OROMUCOSAL

## 2019-09-08 NOTE — Progress Notes (Addendum)
Progress Note    Cameron [name]  [id number] DOB: [birth year]  DOA: 09/06/2019 PCP: Lavone Orn, MD    Brief Narrative:     Medical records reviewed and are as summarized below:  Cameron [name] is an 86 y.o. male from [place].  He presents with medical history significant of dementia, nonischemic cardiomyopathy, COPD, A. fib on Xarelto, GERD, arthritis, hypothyroidism presented to the ED via EMS for evaluation of left-sided upper tooth pain.  Patient reports having slight discomfort in his tooth.  He is not sure when this problem started.  Denies history of any recent dental procedures.  Assessment/Plan:   Principal Problem:   Facial cellulitis Active Problems:   Dementia (HCC)   Atrial fibrillation (HCC)   Hyponatremia    Odontogenic left facial cellulitis:  -No signs of airway compromise.   - CT showing evidence of odontogenic left facial cellulitis and Carious left maxillary molar with associated maxillary alveolus cortical breakthrough and regional left face phlegmon with trace soft tissue gas.  - Plan is to continue antibiotic coverage with Zosyn.   -Edema and redness improved this morning (see lower picture) -Will need outpatient dental follow-up for extraction of tooth -Discussed with on-call dentist, no current need for extraction immediately but will need follow-up with dentist as early as next week  Mild hyponatremia:  Continue to monitor  Nonischemic cardiomyopathy:  -No significant elevation of BNP.  Has mild peripheral edema but lungs clear on exam and no signs of respiratory distress.   -Laying flat on side in bed   Dementia:  -Continue home Aricept  COPD:  -Stable.  No wheezing, cough, or dyspnea.  Order DuoNebs as needed.  A. Fib: Permanent -Stable.   -Continue Cardizem for rate control.   -Resume Xarelto as no intervention currently planned  Hypothyroidism:  Continue home Synthroid   Family Communication/Anticipated D/C date  and plan/Code Status   DVT prophylaxis: Xarelto Code Status: Full Code.  Family Communication: messaged Dr. Lorrin Goodell with son Cameron [name] Disposition Plan: Continue IV antibiotics and monitor p.o. intake and improvement in cellulitis.  PT/ OT consults pending for safe discharge plan.  I suspect patient will remain in the hospital until Monday when he can schedule follow-up with oral surgeon.  I have also ordered home health.  We will asked nursing to get patient out of bed for meals and ambulate every shift  Medical Consultants:    Dentist, Dr. Haig Prophet   Subjective:   Says he does not usually eat breakfast but is interested in what we are having for lunch  Objective:    Vitals:   09/07/19 1818 09/07/19 2022 09/08/19 0047 09/08/19 0417  BP: 132/61 (!) 142/74 (!) 122/58 (!) 114/55  Pulse: 70 76 68 (!) 57  Resp: 18  14 17   Temp: 98.5 F (36.9 C) 98.2 F (36.8 C) 98.3 F (36.8 C) 98.1 F (36.7 C)  TempSrc: Oral Oral Oral Oral  SpO2: 96% 99% 99% 98%  Weight:      Height:        Intake/Output Summary (Last 24 hours) at 09/08/2019 1143 Last data filed at 09/08/2019 [id number] Gross per 24 hour  Intake 141.3 ml  Output 600 ml  Net -458.7 ml   Filed Weights   09/06/19 2048  Weight: 74.8 kg    Exam: Pleasant and cooperative laying flat  in the bed    Pleasant cooperative and engaging Irregular rate and rhythm No increased work of breathing   Data Reviewed:  I have personally reviewed following labs and imaging studies:  Labs: Labs show the following:   Basic Metabolic Panel: Recent Labs  Lab 09/06/19 2210 09/08/19 0408  NA 133* 131*  K 4.0 3.9  CL 98 96*  CO2 24 23  GLUCOSE 114* 82  BUN 12 9  CREATININE 1.19 1.11  CALCIUM 8.9 8.5*   GFR Estimated Creatinine Clearance: 45.5 mL/min (by C-G formula based on SCr of 1.11 mg/dL). Liver Function Tests: Recent Labs  Lab 09/06/19 2210  AST 12*  ALT 9  ALKPHOS 60  BILITOT 0.8  PROT 6.9  ALBUMIN 3.2*     No results for input(s): LIPASE, AMYLASE in the last 168 hours. No results for input(s): AMMONIA in the last 168 hours. Coagulation profile No results for input(s): INR, PROTIME in the last 168 hours.  CBC: Recent Labs  Lab 09/06/19 2210 09/08/19 0408  WBC 8.5 8.0  NEUTROABS 6.1  --   HGB 12.4* 11.5*  HCT 37.2* 33.3*  MCV 99.7 97.9  PLT 207 183   Cardiac Enzymes: No results for input(s): CKTOTAL, CKMB, CKMBINDEX, TROPONINI in the last 168 hours. BNP (last 3 results) No results for input(s): PROBNP in the last 8760 hours. CBG: Recent Labs  Lab 09/07/19 1317  GLUCAP 107*   D-Dimer: No results for input(s): DDIMER in the last 72 hours. Hgb A1c: No results for input(s): HGBA1C in the last 72 hours. Lipid Profile: No results for input(s): CHOL, HDL, LDLCALC, TRIG, CHOLHDL, LDLDIRECT in the last 72 hours. Thyroid function studies: No results for input(s): TSH, T4TOTAL, T3FREE, THYROIDAB in the last 72 hours.  Invalid input(s): FREET3 Anemia work up: Recent Labs    09/07/19 0340  VITAMINB12 181  FOLATE 10.2  FERRITIN 218  TIBC 176*  IRON 42*  RETICCTPCT 1.8   Sepsis Labs: Recent Labs  Lab 09/06/19 2210 09/06/19 2211 09/08/19 0408  WBC 8.5  --  8.0  LATICACIDVEN  --  1.6  --     Microbiology No results found for this or any previous visit (from the past 240 hour(s)).  Procedures and diagnostic studies:  CT Head Wo Contrast  Result Date: 09/06/2019 CLINICAL DATA:  86 year old male with ataxia. Left facial swelling. EXAM: CT HEAD WITHOUT CONTRAST TECHNIQUE: Contiguous axial images were obtained from the base of the skull through the vertex without intravenous contrast. COMPARISON:  Brain MRI 04/07/2017. Head CT 03/22/2019. FINDINGS: Brain: Stable cerebral volume. No ventriculomegaly. No midline shift, mass effect, or evidence of intracranial mass lesion. No acute intracranial hemorrhage identified. Confluent bilateral white matter hypodensity appears stable.  But hypodensity in the bilateral basal ganglia appears mildly progressed since September. No superimposed acute cortically based infarct identified. Vascular: Calcified atherosclerosis at the skull base. No suspicious intracranial vascular hyperdensity. Skull: Carious dentition, cortical breakthrough at the anterior left maxillary molar. See additional details below. No acute fracture identified. Sinuses/Orbits: Visualized paranasal sinuses and mastoids are stable and well pneumatized. There is mild left maxillary alveolar recess mucosal thickening. Other: Partially visible asymmetric left anterior face, premalar soft tissue swelling and stranding (series 4, image 29). Furthermore, better demonstrated on coronal images there is confluent phlegmon like opacity with trace soft tissue gas lateral to a carious left upper maxillary tooth with evidence of cortical breakthrough (series 5, image 7). Orbit and scalp soft tissues appear negative. IMPRESSION: 1. Evidence of Odontogenic Left Facial Cellulitis. Carious left maxillary molar with associated maxillary alveolus cortical breakthrough and regional left face phlegmon with trace soft tissue gas. 2.  Evidence of cerebral small vessel disease which appears mildly progressed in the bilateral basal ganglia since September. 3. Otherwise no acute intracranial abnormality. Electronically Signed   By: Genevie Ann M.D.   On: 09/06/2019 22:44    Medications:   . calcium-vitamin D  1 tablet Oral Q breakfast  . chlorhexidine  15 mL Mouth Rinse BID  . darifenacin  15 mg Oral Daily  . diltiazem  120 mg Oral Daily  . donepezil  10 mg Oral QHS  . escitalopram  5 mg Oral Daily  . furosemide  20 mg Oral Once per day on Mon Wed Fri  . levothyroxine  75 mcg Oral QAC breakfast  . mouth rinse  15 mL Mouth Rinse q12n4p  . pantoprazole  40 mg Oral Daily  . Rivaroxaban  15 mg Oral Q supper  . tamsulosin  0.4 mg Oral Daily  . vitamin B-12  1,000 mcg Oral Daily   Continuous  Infusions: . piperacillin-tazobactam (ZOSYN)  IV 3.375 g (09/08/19 0601)     LOS: 2 days   Geradine Girt  [facility]   How to contact the [facility] Attending or Consulting provider [place] or covering provider during after hours [place], for this patient?  1. Check the care team in [facility] and look for a) attending/consulting [facility] provider listed and b) the [facility] team listed 2. Log into [email or web address] and use [place]'s universal password to access. If you do not have the password, please contact the hospital operator. 3. Locate the [facility] provider you are looking for under [facility] and page to a number that you can be directly reached. 4. If you still have difficulty reaching the provider, please page the [facility] (Director on Call) for the Hospitalists listed on amion for assistance.  09/08/2019, 11:43 AM

## 2019-09-08 NOTE — Plan of Care (Signed)
  Problem: Education: Goal: Knowledge of General Education information will improve Description: Including pain rating scale, medication(s)/side effects and non-pharmacologic comfort measures Outcome: Progressing   Problem: Clinical Measurements: Goal: Will remain free from infection Outcome: Progressing   

## 2019-09-08 NOTE — Evaluation (Signed)
Physical Therapy Evaluation Patient Details Name: Cameron [name] MRN: [id number] DOB: 1925/09/15 Today's Date: 09/08/2019   History of Present Illness  86 yo admitted with left upper tooth pain with odontogenic facial cellulitis. PMHx: dementia, CHF, Afib, NICM, COPD, GERD, arthritis  Clinical Impression  Pt pleasant in supine on arrival unable to state how long he has been at [facility] or exactly what level of care he receives there. Pt with decreased safety, balance and activity tolerance who will benefit from acute therapy to maximize independence and function. If pt from ALF level recommend increased supervision with HHPT and if in SNF level therapy may be resumed by their staff.     Follow Up Recommendations Home health PT;Supervision for mobility/OOB    Equipment Recommendations  Rolling walker with 5" wheels    Recommendations for Other Services       Precautions / Restrictions Precautions Precautions: Fall      Mobility  Bed Mobility Overal bed mobility: Needs Assistance Bed Mobility: Supine to Sit     Supine to sit: Supervision     General bed mobility comments: supervision for lines, increased time and struggle  Transfers Overall transfer level: Needs assistance   Transfers: Sit to/from Stand Sit to Stand: Min guard         General transfer comment: supervision for lines and safety  Ambulation/Gait Ambulation/Gait assistance: Min guard Gait Distance (Feet): 120 Feet Assistive device: None;Rolling walker (2 wheeled) Gait Pattern/deviations: Step-through pattern;Decreased stride length;Narrow base of support   Gait velocity interpretation: <1.8 ft/sec, indicate of risk for recurrent falls General Gait Details: pt initiating gait without rW with unsteady gait and reaching out for environmental support. PRovided RW with improved stability with cues for use and proximity. Pt walked 40' then 120'  Stairs            Wheelchair Mobility     Modified Rankin (Stroke Patients Only)       Balance Overall balance assessment: Needs assistance   Sitting balance-Leahy Scale: Good       Standing balance-Leahy Scale: Fair Standing balance comment: pt able to stand without assist but benefits from UE support for movement                             Pertinent Vitals/Pain Pain Assessment: No/denies pain    Home Living Family/patient expects to be discharged to:: Assisted living               Home Equipment: Kasandra Knudsen - single point      Prior Function Level of Independence: Needs assistance   Gait / Transfers Assistance Needed: pt reports he walks without AD to dining hall  ADL's / Homemaking Assistance Needed: pt reports he performs ADLs without assist and facility does IADL        Hand Dominance        Extremity/Trunk Assessment   Upper Extremity Assessment Upper Extremity Assessment: Generalized weakness    Lower Extremity Assessment Lower Extremity Assessment: Generalized weakness    Cervical / Trunk Assessment Cervical / Trunk Assessment: Kyphotic  Communication   Communication: HOH  Cognition Arousal/Alertness: Awake/alert Behavior During Therapy: WFL for tasks assessed/performed Overall Cognitive Status: Impaired/Different from baseline Area of Impairment: Memory;Orientation                 Orientation Level: Time;Place   Memory: Decreased short-term memory  General Comments      Exercises     Assessment/Plan    PT Assessment Patient needs continued PT services  PT Problem List Decreased strength;Decreased mobility;Decreased safety awareness;Decreased activity tolerance;Decreased balance;Decreased knowledge of use of DME       PT Treatment Interventions DME instruction;Therapeutic exercise;Gait training;Balance training;Functional mobility training;Therapeutic activities;Patient/family education    PT Goals (Current goals can be found in the  Care Plan section)  Acute Rehab PT Goals Patient Stated Goal: return home PT Goal Formulation: With patient Time For Goal Achievement: 09/22/19 Potential to Achieve Goals: Fair    Frequency Min 2X/week   Barriers to discharge        Co-evaluation               AM-PAC PT "6 Clicks" Mobility  Outcome Measure Help needed turning from your back to your side while in a flat bed without using bedrails?: A Little Help needed moving from lying on your back to sitting on the side of a flat bed without using bedrails?: A Little Help needed moving to and from a bed to a chair (including a wheelchair)?: A Little Help needed standing up from a chair using your arms (e.g., wheelchair or bedside chair)?: A Little Help needed to walk in hospital room?: A Little Help needed climbing 3-5 steps with a railing? : A Little 6 Click Score: 18    End of Session Equipment Utilized During Treatment: Gait belt Activity Tolerance: Patient tolerated treatment well Patient left: in chair;with bed alarm set;with chair alarm set Nurse Communication: Mobility status PT Visit Diagnosis: Other abnormalities of gait and mobility (R26.89);Difficulty in walking, not elsewhere classified (R26.2)    Time: [id number] PT Time Calculation (min) (ACUTE ONLY): 24 min   Charges:   PT Evaluation $PT Eval Moderate Complexity: 1 Mod PT Treatments $Gait Training: 8-22 mins        Doha Boling P, PT Acute Rehabilitation Services Pager: [phone] Office: [place] 09/08/2019, 9:35 AM

## 2019-09-08 NOTE — Evaluation (Signed)
Occupational Therapy Evaluation Patient Details Name: Cameron [name] MRN: [id number] DOB: 09-Feb-1926 Today's Date: 09/08/2019    History of Present Illness 86 yo admitted with left upper tooth pain with odontogenic facial cellulitis. PMHx: dementia, CHF, Afib, NICM, COPD, GERD, arthritis   Clinical Impression   Pt reports that he is independent in ADL and mobility without DME. Today he is min A for toilet transfers (boost and vc for safety) mod A for LB dressing, min guard for standing grooming tasks at sink. Pt will benefit from skilled OT in the acute setting and afterwards at the [facility] level if Pt is living in ALF, or continue services if Pt lives in SNF. Next session to focus on LB ADL as well as standing grooming tasks at sink.     Follow Up Recommendations  Home health OT    Equipment Recommendations    Defer to next venue of care   Recommendations for Other Services   N/A - but ensure discussion with Pt and son about code status? Full code?     Precautions / Restrictions Precautions Precautions: Fall      Mobility Bed Mobility Overal bed mobility: Needs Assistance Bed Mobility: Supine to Sit     Supine to sit: Supervision     General bed mobility comments: OOB in recliner at beginning and end of session  Transfers Overall transfer level: Needs assistance Equipment used: Rolling walker (2 wheeled) Transfers: Sit to/from Stand Sit to Stand: Min assist         General transfer comment: min A for boost, vc for safety    Balance Overall balance assessment: Needs assistance Sitting-balance support: No upper extremity supported;Feet supported Sitting balance-Leahy Scale: Good       Standing balance-Leahy Scale: Fair Standing balance comment: pt able to stand without assist but benefits from UE support for movement                           ADL either performed or assessed with clinical judgement   ADL Overall ADL's : Needs  assistance/impaired Eating/Feeding: Set up;Sitting   Grooming: Min guard;Standing;Wash/dry hands Grooming Details (indicate cue type and reason): fatigues quickly and requires support of the sink surface or RW for balance Upper Body Bathing: Min guard;Sitting   Lower Body Bathing: Minimal assistance;Sitting/lateral leans   Upper Body Dressing : Min guard;Sitting   Lower Body Dressing: Moderate assistance;Sitting/lateral leans Lower Body Dressing Details (indicate cue type and reason): to don socks, able to perform figure 4 with L but not right foot Toilet Transfer: Minimal assistance;Cueing for safety;Ambulation;RW Toilet Transfer Details (indicate cue type and reason): cues for safety with RW, min A to steer around objects in room Toileting- Clothing Manipulation and Hygiene: Minimal assistance       Functional mobility during ADLs: Minimal assistance;Rolling walker;Cueing for safety General ADL Comments: decreased balance, decreased safety awareness,     Vision Baseline Vision/History: Wears glasses Wears Glasses: At all times Patient Visual Report: No change from baseline       Perception     Praxis      Pertinent Vitals/Pain Pain Assessment: No/denies pain     Hand Dominance Right   Extremity/Trunk Assessment Upper Extremity Assessment Upper Extremity Assessment: Generalized weakness   Lower Extremity Assessment Lower Extremity Assessment: Generalized weakness   Cervical / Trunk Assessment Cervical / Trunk Assessment: Kyphotic   Communication Communication Communication: HOH   Cognition Arousal/Alertness: Awake/alert Behavior During Therapy: WFL for tasks  assessed/performed Overall Cognitive Status: Impaired/Different from baseline Area of Impairment: Memory;Orientation                 Orientation Level: Time;Place   Memory: Decreased short-term memory             General Comments       Exercises     Shoulder Instructions      Home  Living Family/patient expects to be discharged to:: Assisted living                             Home Equipment: Cane - single point          Prior Functioning/Environment Level of Independence: Needs assistance  Gait / Transfers Assistance Needed: pt reports he walks without AD to dining hall ADL's / Homemaking Assistance Needed: pt reports he performs ADLs without assist and facility does IADL   Comments: pt unreliable historian?        OT Problem List: Decreased activity tolerance;Impaired balance (sitting and/or standing);Decreased safety awareness;Decreased knowledge of use of DME or AE      OT Treatment/Interventions: Self-care/ADL training;Therapeutic exercise;DME and/or AE instruction;Therapeutic activities;Patient/family education;Balance training    OT Goals(Current goals can be found in the care plan section) Acute Rehab OT Goals Patient Stated Goal: return home OT Goal Formulation: With patient Time For Goal Achievement: 09/22/19 Potential to Achieve Goals: Good ADL Goals Pt Will Perform Grooming: with supervision;standing Pt Will Perform Upper Body Dressing: with supervision;sitting Pt Will Perform Lower Body Dressing: with supervision;sit to/from stand Pt Will Transfer to Toilet: with supervision;ambulating Pt Will Perform Toileting - Clothing Manipulation and hygiene: with modified independence;sit to/from stand  OT Frequency: Min 2X/week   Barriers to D/C:    unsure of level of support at baseline       Co-evaluation              AM-PAC OT "6 Clicks" Daily Activity     Outcome Measure Help from another person eating meals?: None Help from another person taking care of personal grooming?: A Little Help from another person toileting, which includes using toliet, bedpan, or urinal?: A Little Help from another person bathing (including washing, rinsing, drying)?: A Little Help from another person to put on and taking off regular upper body  clothing?: A Little Help from another person to put on and taking off regular lower body clothing?: A Lot 6 Click Score: 18   End of Session Equipment Utilized During Treatment: Gait belt;Rolling walker Nurse Communication: Mobility status  Activity Tolerance: Patient tolerated treatment well Patient left: in chair;with call bell/phone within reach;with chair alarm set;with nursing/sitter in room  OT Visit Diagnosis: Unsteadiness on feet (R26.81);Other abnormalities of gait and mobility (R26.89);Muscle weakness (generalized) (M62.81);Other symptoms and signs involving cognitive function                Time: [id number] OT Time Calculation (min): 18 min Charges:  OT General Charges $OT Visit: 1 Visit OT Evaluation $OT Eval Moderate Complexity: [place] OTR/L Acute Rehabilitation Services Pager: [phone] Office: [place] 09/08/2019, 1:05 PM

## 2019-09-09 LAB — BASIC METABOLIC PANEL
Anion gap: 10 (ref 5–15)
BUN: 14 mg/dL (ref 8–23)
CO2: 24 mmol/L (ref 22–32)
Calcium: 8.3 mg/dL — ABNORMAL LOW (ref 8.9–10.3)
Chloride: 96 mmol/L — ABNORMAL LOW (ref 98–111)
Creatinine, Ser: 1.11 mg/dL (ref 0.61–1.24)
GFR calc Af Amer: >60 mL/min (ref >60)
GFR calc non Af Amer: 58 mL/min — ABNORMAL LOW (ref >60)
Glucose, Bld: 104 mg/dL — ABNORMAL HIGH (ref 70–99)
Potassium: 3.6 mmol/L (ref 3.5–5.1)
Sodium: 130 mmol/L — ABNORMAL LOW (ref 135–145)

## 2019-09-09 LAB — CBC
HCT: 34 % — ABNORMAL LOW (ref 39.0–52.0)
Hemoglobin: 12 g/dL — ABNORMAL LOW (ref 13.0–17.0)
MCH: 33.5 pg (ref 26.0–34.0)
MCHC: 35.3 g/dL (ref 30.0–36.0)
MCV: 95 fL (ref 80.0–100.0)
Platelets: 185 10*3/uL (ref 150–400)
RBC: 3.58 MIL/uL — ABNORMAL LOW (ref 4.22–5.81)
RDW: 12.2 % (ref 11.5–15.5)
WBC: 7.3 10*3/uL (ref 4.0–10.5)
nRBC: 0 % (ref 0.0–0.2)

## 2019-09-09 NOTE — Plan of Care (Signed)

## 2019-09-09 NOTE — Progress Notes (Signed)
Progress Note    Cameron [name]  [id number] DOB: Aug 25, 1925  DOA: 09/06/2019 PCP: Lavone Orn, MD    Brief Narrative:     Medical records reviewed and are as summarized below:  Cameron [name] is an 86 y.o. male from [place].  He presents with medical history significant of dementia, nonischemic cardiomyopathy, COPD, A. fib on Xarelto, GERD, arthritis, hypothyroidism presented to the ED via EMS for evaluation of left-sided upper tooth pain.  Patient reports having slight discomfort in his tooth.  He is not sure when this problem started.  Denies history of any recent dental procedures.  Assessment/Plan:   Principal Problem:   Facial cellulitis Active Problems:   Dementia (HCC)   Atrial fibrillation (HCC)   Hyponatremia    Odontogenic left facial cellulitis:  -No signs of airway compromise.   - CT showing evidence of odontogenic left facial cellulitis and Carious left maxillary molar with associated maxillary alveolus cortical breakthrough and regional left face phlegmon with trace soft tissue gas.  - Plan is to continue antibiotic coverage with Zosyn.   -Edema and redness continues to improve -Will need outpatient dental/oral surgery follow-up for extraction of tooth -Discussed with on-call dentist, no current need for extraction immediately but will need follow-up with dentist as early as next week  Mild hyponatremia:  Trending down -check urine studies and osmos in the AM  Nonischemic cardiomyopathy:  -No significant elevation of BNP.  Has mild peripheral edema but lungs clear on exam and no signs of respiratory distress.   -Laying flat on side in bed   Dementia:  -Continue home Aricept  COPD:  -Stable.  No wheezing, cough, or dyspnea.  Order DuoNebs as needed.  A. Fib: Permanent -Stable.   -Continue Cardizem for rate control.   -Resume Xarelto as no intervention currently planned  Hypothyroidism:  Continue home Synthroid  I have asked  nursing to get patient out of bed for meals and to ambulate every shift to hopefully maintain his muscle strength prior to discharge  Family Communication/Anticipated D/C date and plan/Code Status   DVT prophylaxis: Xarelto Code Status: Full Code.  Family Communication: call son [name]- NA Disposition Plan: Continue IV antibiotics and monitor p.o. intake and improvement in cellulitis.  PT/ OT consults pending for safe discharge plan.  I suspect patient will remain in the hospital until Monday when he can schedule follow-up with oral surgeon.  I have also ordered home health.  We will asked nursing to get patient out of bed for meals and ambulate every shift  Medical Consultants:    Dentist, Dr. Haig Prophet   Subjective:   Says his pain is better controlled  Objective:    Vitals:   09/08/19 1700 09/08/19 1943 09/09/19 0446 09/09/19 0754  BP: (!) 122/55 126/61 (!) 110/55 (!) 130/57  Pulse: 60 77 76 63  Resp: 18   16  Temp: 98.2 F (36.8 C) (!) 97.4 F (36.3 C) 97.8 F (36.6 C) (!) 97.3 F (36.3 C)  TempSrc: Oral Oral Oral Oral  SpO2: 98% 99% 99% 99%  Weight:      Height:        Intake/Output Summary (Last 24 hours) at 09/09/2019 1418 Last data filed at 09/09/2019 0900 Gross per 24 hour  Intake 120 ml  Output --  Net 120 ml   Filed Weights   09/06/19 2048  Weight: 74.8 kg    Exam: Pleasant and cooperative, engaging Irregular rhythm but rate controlled Positive bowel sounds,  soft nontender Minimal lower extremity edema Skin with multiple chronic changes of aging Swelling on left side of face and eye much improved this a.m.   Data Reviewed:   I have personally reviewed following labs and imaging studies:  Labs: Labs show the following:   Basic Metabolic Panel: Recent Labs  Lab 09/06/19 2210 09/06/19 2210 09/08/19 0408 09/09/19 0301  NA 133*  --  131* 130*  K 4.0   < > 3.9 3.6  CL 98  --  96* 96*  CO2 24  --  23 24  GLUCOSE 114*  --  82 104*  BUN 12  --   9 14  CREATININE 1.19  --  1.11 1.11  CALCIUM 8.9  --  8.5* 8.3*   < > = values in this interval not displayed.   GFR Estimated Creatinine Clearance: 45.5 mL/min (by C-G formula based on SCr of 1.11 mg/dL). Liver Function Tests: Recent Labs  Lab 09/06/19 2210  AST 12*  ALT 9  ALKPHOS 60  BILITOT 0.8  PROT 6.9  ALBUMIN 3.2*   No results for input(s): LIPASE, AMYLASE in the last 168 hours. No results for input(s): AMMONIA in the last 168 hours. Coagulation profile No results for input(s): INR, PROTIME in the last 168 hours.  CBC: Recent Labs  Lab 09/06/19 2210 09/08/19 0408 09/09/19 0301  WBC 8.5 8.0 7.3  NEUTROABS 6.1  --   --   HGB 12.4* 11.5* 12.0*  HCT 37.2* 33.3* 34.0*  MCV 99.7 97.9 95.0  PLT 207 183 185   Cardiac Enzymes: No results for input(s): CKTOTAL, CKMB, CKMBINDEX, TROPONINI in the last 168 hours. BNP (last 3 results) No results for input(s): PROBNP in the last 8760 hours. CBG: Recent Labs  Lab 09/07/19 1317  GLUCAP 107*   D-Dimer: No results for input(s): DDIMER in the last 72 hours. Hgb A1c: No results for input(s): HGBA1C in the last 72 hours. Lipid Profile: No results for input(s): CHOL, HDL, LDLCALC, TRIG, CHOLHDL, LDLDIRECT in the last 72 hours. Thyroid function studies: No results for input(s): TSH, T4TOTAL, T3FREE, THYROIDAB in the last 72 hours.  Invalid input(s): FREET3 Anemia work up: Recent Labs    09/07/19 0340  VITAMINB12 181  FOLATE 10.2  FERRITIN 218  TIBC 176*  IRON 42*  RETICCTPCT 1.8   Sepsis Labs: Recent Labs  Lab 09/06/19 2210 09/06/19 2211 09/08/19 0408 09/09/19 0301  WBC 8.5  --  8.0 7.3  LATICACIDVEN  --  1.6  --   --     Microbiology No results found for this or any previous visit (from the past 240 hour(s)).  Procedures and diagnostic studies:  No results found.  Medications:   . calcium-vitamin D  1 tablet Oral Q breakfast  . chlorhexidine  15 mL Mouth Rinse BID  . darifenacin  15 mg Oral  Daily  . diltiazem  120 mg Oral Daily  . donepezil  10 mg Oral QHS  . escitalopram  5 mg Oral Daily  . furosemide  20 mg Oral Once per day on Mon Wed Fri  . levothyroxine  75 mcg Oral QAC breakfast  . mouth rinse  15 mL Mouth Rinse q12n4p  . pantoprazole  40 mg Oral Daily  . Rivaroxaban  15 mg Oral Q supper  . tamsulosin  0.4 mg Oral Daily  . vitamin B-12  1,000 mcg Oral Daily   Continuous Infusions: . piperacillin-tazobactam (ZOSYN)  IV 3.375 g (09/09/19 0549)     LOS:  3 days   [place] Hospitalists   How to contact the [facility] Attending or Consulting provider [place] or covering provider during after hours [place], for this patient?  1. Check the care team in [facility] and look for a) attending/consulting TRH provider listed and b) the [facility] team listed 2. Log into [email or web address] and use [place]'s universal password to access. If you do not have the password, please contact the hospital operator. 3. Locate the [facility] provider you are looking for under [facility] and page to a number that you can be directly reached. 4. If you still have difficulty reaching the provider, please page the [facility] (Director on Call) for the Hospitalists listed on amion for assistance.  09/09/2019, 2:18 PM

## 2019-09-10 LAB — BASIC METABOLIC PANEL
Anion gap: 10 (ref 5–15)
Anion gap: 10 (ref 5–15)
BUN: 9 mg/dL (ref 8–23)
BUN: 7 mg/dL — ABNORMAL LOW (ref 8–23)
CO2: 24 mmol/L (ref 22–32)
CO2: 20 mmol/L — ABNORMAL LOW (ref 22–32)
Calcium: 8.4 mg/dL — ABNORMAL LOW (ref 8.9–10.3)
Calcium: 8.4 mg/dL — ABNORMAL LOW (ref 8.9–10.3)
Chloride: 92 mmol/L — ABNORMAL LOW (ref 98–111)
Chloride: 94 mmol/L — ABNORMAL LOW (ref 98–111)
Creatinine, Ser: 1.09 mg/dL (ref 0.61–1.24)
Creatinine, Ser: 1.12 mg/dL (ref 0.61–1.24)
GFR calc Af Amer: >60 mL/min (ref >60)
GFR calc Af Amer: >60 mL/min (ref >60)
GFR calc non Af Amer: 59 mL/min — ABNORMAL LOW (ref >60)
GFR calc non Af Amer: 57 mL/min — ABNORMAL LOW (ref >60)
Glucose, Bld: 101 mg/dL — ABNORMAL HIGH (ref 70–99)
Glucose, Bld: 145 mg/dL — ABNORMAL HIGH (ref 70–99)
Potassium: 3.9 mmol/L (ref 3.5–5.1)
Potassium: 4.1 mmol/L (ref 3.5–5.1)
Sodium: 126 mmol/L — ABNORMAL LOW (ref 135–145)
Sodium: 124 mmol/L — ABNORMAL LOW (ref 135–145)

## 2019-09-10 LAB — OSMOLALITY, URINE: Osmolality, Ur: 581 mOsm/kg (ref 300–900)

## 2019-09-10 LAB — NA AND K (SODIUM & POTASSIUM), RAND UR
Potassium Urine: 46 mmol/L
Sodium, Ur: 110 mmol/L

## 2019-09-10 LAB — OSMOLALITY: Osmolality: 266 mOsm/kg — ABNORMAL LOW (ref 275–295)

## 2019-09-10 MED ORDER — AMOXICILLIN-POT CLAVULANATE 875-125 MG PO TABS
1.0000 | ORAL_TABLET | Freq: Two times a day (BID) | ORAL | Status: DC
  Administered 2019-09-11 – 2019-09-14 (×7): 1 via ORAL
  Filled 2019-09-10 (×7): qty 1

## 2019-09-10 MED ORDER — SODIUM CHLORIDE 0.9 % IV SOLN: INTRAVENOUS | Status: DC

## 2019-09-10 NOTE — Plan of Care (Signed)
  Problem: Health Behavior/Discharge Planning: Goal: Ability to manage health-related needs will improve Outcome: Progressing   Problem: Clinical Measurements: Goal: Ability to maintain clinical measurements within normal limits will improve Outcome: Progressing   Problem: Activity: Goal: Risk for activity intolerance will decrease Outcome: Progressing   Problem: Nutrition: Goal: Adequate nutrition will be maintained Outcome: Progressing   Problem: Coping: Goal: Level of anxiety will decrease Outcome: Progressing   Problem: Elimination: Goal: Will not experience complications related to bowel motility Outcome: Progressing   Problem: Pain Managment: Goal: General experience of comfort will improve Outcome: Progressing   Problem: Skin Integrity: Goal: Risk for impaired skin integrity will decrease Outcome: Progressing

## 2019-09-10 NOTE — Progress Notes (Signed)
Progress Note    Cameron [name]  [id number] DOB: [birth year]  DOA: 09/06/2019 PCP: Lavone Orn, MD    Brief Narrative:     Medical records reviewed and are as summarized below:  Cameron [name] is an 86 y.o. male from [place].  He presents with medical history significant of dementia, nonischemic cardiomyopathy, COPD, A. fib on Xarelto, GERD, arthritis, hypothyroidism presented to the ED via EMS for evaluation of left-sided upper tooth pain.  Patient reports having slight discomfort in his tooth.  He has been improving with IV antibiotics but his hospital stay has been complicated by hyponatremia. Assessment/Plan:   Principal Problem:   Facial cellulitis Active Problems:   Dementia (HCC)   Atrial fibrillation (HCC)   Hyponatremia    Odontogenic left facial cellulitis:  - CT showing evidence of odontogenic left facial cellulitis and Carious left maxillary molar with associated maxillary alveolus cortical breakthrough and regional left face phlegmon with trace soft tissue gas.  - Plan is to continue antibiotic coverage with Zosyn-we will change to p.o. antibiotics starting in the a.m. 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  Has mild peripheral edema but lungs clear on exam and no signs of respiratory distress.   -Laying flat on side in bed   Dementia:  -Continue home Aricept  COPD:   -Stable.  No wheezing, cough, or dyspnea.  Order DuoNebs as needed.  A. Fib: Permanent -Stable.   -Continue Cardizem for rate control.   -Resume Xarelto as no intervention currently planned  Hypothyroidism:  Continue home Synthroid  I have asked nursing to get patient out of bed for meals and to ambulate every shift to hopefully maintain his muscle strength prior to discharge  Family Communication/Anticipated D/C date and plan/Code Status   DVT prophylaxis: Xarelto Code Status: Full Code.  Family Communication: call son Inocente Salles- [place] 3/13 Disposition Plan: Maxie Better for IV antibiotics to change to p.o. antibiotics starting in the a.m.  Patient needs continued close monitoring of his sodium as it continues to drop.  Hope patient can return to assisted living facility in the next 24 to 48 hours  Medical Consultants:    Dentist, Dr. Haig Prophet   Subjective:   No complaints this a.m., able to eat without pain  Objective:    Vitals:   09/09/19 1500 09/09/19 1926 09/10/19 0352 09/10/19 0816  BP: 128/64 (!) 141/59 115/61 126/68  Pulse: 72 70 78 68  Resp: 17   17  Temp: (!) 97.5 F (36.4 C) (!) 97.3 F (36.3 C) (!) 97.5 F (36.4 C) 98.3 F (36.8 C)  TempSrc: Oral Oral Oral Oral  SpO2: 100% 99% 98% 97%  Weight:      Height:        Intake/Output Summary (Last 24 hours) at 09/10/2019 1311 Last data filed at 09/10/2019 1000 Gross per 24 hour  Intake 1061.28 ml  Output 700 ml  Net 361.28 ml   [employer]  09/06/19 2048  Weight: 74.8 kg    Exam: Swelling and redness in face are much improved and almost completely resolved Regular rate and rhythm No increased work of breathing, no wheezing, no lower extremity edema Alert and pleasantly cooperative   Data Reviewed:   I have personally reviewed following labs and imaging studies:  Labs: Labs show the following:   Basic Metabolic Panel: Recent Labs  Lab 09/06/19 2210 09/06/19 2210 09/08/19 0408 09/08/19 0408  09/09/19 0301 09/10/19 0333  NA 133*  --  131*  --  130* 126*  K 4.0   < > 3.9   < > 3.6 3.9  CL 98  --  96*  --  96* 92*  CO2 24  --  23  --  24 24  GLUCOSE 114*  --  82  --  104* 101*  BUN 12  --  9  --  14 9  CREATININE 1.19  --  1.11  --  1.11 1.09  CALCIUM 8.9  --  8.5*  --  8.3* 8.4*   < > = values in this interval not displayed.   GFR Estimated Creatinine Clearance: 46.3 mL/min (by C-G formula based on SCr of 1.09 mg/dL). Liver Function Tests: Recent Labs  Lab 09/06/19 2210  AST 12*  ALT 9  ALKPHOS 60  BILITOT 0.8  PROT 6.9  ALBUMIN 3.2*   No results for input(s): LIPASE, AMYLASE in the last 168 hours. No results for input(s): AMMONIA in the last 168 hours. Coagulation profile No results for input(s): INR, PROTIME in the last 168 hours.  CBC: Recent Labs  Lab 09/06/19 2210 09/08/19 0408 09/09/19 0301  WBC 8.5 8.0 7.3  NEUTROABS 6.1  --   --   HGB 12.4* 11.5* 12.0*  HCT 37.2* 33.3* 34.0*  MCV 99.7 97.9 95.0  PLT 207 183 185   Cardiac Enzymes: No results for input(s): CKTOTAL, CKMB, CKMBINDEX, TROPONINI in the last 168 hours. BNP (last 3 results) No results for input(s): PROBNP in the last 8760 hours. CBG: Recent Labs  Lab 09/07/19 1317  GLUCAP 107*   D-Dimer: No results for input(s): DDIMER in the last 72 hours. Hgb A1c: No results for input(s): HGBA1C in the last 72 hours. Lipid Profile: No results for input(s): CHOL, HDL, LDLCALC, TRIG, CHOLHDL, LDLDIRECT in the last 72 hours. Thyroid function studies: No results for input(s): TSH, T4TOTAL, T3FREE, THYROIDAB in the last 72 hours.  Invalid input(s): FREET3 Anemia work up: No results for input(s): VITAMINB12, FOLATE, FERRITIN, TIBC, IRON, RETICCTPCT in the last 72 hours. Sepsis Labs: Recent Labs  Lab 09/06/19 2210 09/06/19 2211 09/08/19 0408 09/09/19 0301  WBC 8.5  --  8.0 7.3  LATICACIDVEN  --  1.6  --   --     Microbiology No results found for this or any previous visit (from the  past 240 hour(s)).  Procedures and diagnostic studies:  No results found.  Medications:   . calcium-vitamin D  1 tablet Oral Q breakfast  . chlorhexidine  15 mL Mouth Rinse BID  . darifenacin  15 mg Oral Daily  . diltiazem  120 mg Oral Daily  . donepezil  10 mg Oral QHS  . escitalopram  5 mg Oral Daily  . furosemide  20 mg Oral Once per day on Mon Wed Fri  . levothyroxine  75 mcg Oral QAC breakfast  . mouth rinse  15 mL Mouth Rinse q12n4p  . pantoprazole  40 mg Oral Daily  . Rivaroxaban  15 mg Oral Q supper  . tamsulosin  0.4 mg Oral Daily  . vitamin B-12  1,000 mcg Oral Daily   Continuous Infusions: . sodium chloride    . piperacillin-tazobactam (ZOSYN)  IV 3.375 g (09/10/19 0641)     LOS: 4 days   Geradine Girt  [facility]   How to contact the [facility] Attending or Consulting provider [place] or covering provider during after hours [place], for this patient?  1. Check the care team in [facility] and look for a) attending/consulting TRH provider listed and b) the [facility] team listed 2. Log into [email or web address] and use [place]'s universal password to access. If you do not have the password, please contact the hospital operator. 3. Locate the [facility] provider you are looking for under [facility] and page to a number that you can be directly reached. 4. If you still have difficulty reaching the provider, please page the [facility] (Director on Call) for the Hospitalists listed on amion for assistance.  09/10/2019, 1:11 PM

## 2019-09-10 NOTE — Progress Notes (Signed)
At shift change, Pt found sitting on the floor, vital signs taken and recorded, Pt assisted to bed. Pt denies any pain 0/10, denies hitting head, states he landed on his buttocks. X. Blount notified, no new orders received at this time, [name](son) notified over the phone, endorsed accordingly to oncoming RN.

## 2019-09-10 NOTE — Progress Notes (Addendum)
Pharmacy Antibiotic Note  Cameron [name] is a 86 y.o. male admitted on 09/06/2019 with odontogenic left facial cellulitis. Pharmacy has been consulted for Zosyn dosing.  CT showing evidence of odontogenic left facial cellulitis, edema and redness improving, looks like outpatient dental / oral surgery planned for extraction of tooth. WBC wnl at 7.3, crcl 46.3 and stable. Patient is afebrile.  Plan: Continue Zosyn 3.375G IV q8h Extended infusion duration Trend WBC, temp, renal function, length of therapy  Height: 5' 10.5" (179.1 cm) Weight: 165 lb (74.8 kg) IBW/kg (Calculated) : 74.15  Temp (24hrs), Avg:97.4 F (36.3 C), Min:97.3 F (36.3 C), Max:97.5 F (36.4 C)  Recent Labs  Lab 09/06/19 2210 09/06/19 2211 09/08/19 0408 09/09/19 0301 09/10/19 0333  WBC 8.5  --  8.0 7.3  --   CREATININE 1.19  --  1.11 1.11 1.09  LATICACIDVEN  --  1.6  --   --   --     Estimated Creatinine Clearance: 46.3 mL/min (by C-G formula based on SCr of 1.09 mg/dL).    No Known Allergies  Thank you for the interesting consult and for involving pharmacy in this patient's care.  Tamela Gammon, PharmD, BCPS 09/10/2019 7:49 AM PGY-2 Pharmacy Administration Resident Please check [email or web address] for unit-specific pharmacist phone numbers

## 2019-09-11 LAB — BASIC METABOLIC PANEL
Anion gap: 11 (ref 5–15)
Anion gap: 9 (ref 5–15)
BUN: 8 mg/dL (ref 8–23)
BUN: 7 mg/dL — ABNORMAL LOW (ref 8–23)
CO2: 21 mmol/L — ABNORMAL LOW (ref 22–32)
CO2: 23 mmol/L (ref 22–32)
Calcium: 8.2 mg/dL — ABNORMAL LOW (ref 8.9–10.3)
Calcium: 8.2 mg/dL — ABNORMAL LOW (ref 8.9–10.3)
Chloride: 96 mmol/L — ABNORMAL LOW (ref 98–111)
Chloride: 96 mmol/L — ABNORMAL LOW (ref 98–111)
Creatinine, Ser: 1.05 mg/dL (ref 0.61–1.24)
Creatinine, Ser: 1.1 mg/dL (ref 0.61–1.24)
GFR calc Af Amer: >60 mL/min (ref >60)
GFR calc Af Amer: >60 mL/min (ref >60)
GFR calc non Af Amer: >60 mL/min (ref >60)
GFR calc non Af Amer: 58 mL/min — ABNORMAL LOW (ref >60)
Glucose, Bld: 89 mg/dL (ref 70–99)
Glucose, Bld: 103 mg/dL — ABNORMAL HIGH (ref 70–99)
Potassium: 3.7 mmol/L (ref 3.5–5.1)
Potassium: 4.1 mmol/L (ref 3.5–5.1)
Sodium: 128 mmol/L — ABNORMAL LOW (ref 135–145)
Sodium: 128 mmol/L — ABNORMAL LOW (ref 135–145)

## 2019-09-11 NOTE — Progress Notes (Signed)
Physical Therapy Treatment Patient Details Name: Cameron [name] MRN: [id number] DOB: 29-Mar-1926 Today's Date: 09/11/2019    History of Present Illness 86 yo admitted with left upper tooth pain with odontogenic facial cellulitis. PMHx: dementia, CHF, Afib, NICM, COPD, GERD, arthritis    PT Comments    Pt pleasant and willing to mobilize stating no pain. Pt with increased posterior right lean this session not present on eval with increased assist required for transfers and gait. Pt now requires SNF and not ALF upon D/C. Will continue to follow and progress mobility and cognition.     Follow Up Recommendations  SNF;Supervision/Assistance - 24 hour     Equipment Recommendations  Rolling walker with 5" wheels    Recommendations for Other Services       Precautions / Restrictions Precautions Precautions: Fall    Mobility  Bed Mobility Overal bed mobility: Needs Assistance Bed Mobility: Supine to Sit     Supine to sit: Min assist     General bed mobility comments: min assist to elevate trunk from surface with increased time and cues for initiation  Transfers Overall transfer level: Needs assistance   Transfers: Sit to/from Stand Sit to Stand: Min assist         General transfer comment: min assist to rise from bed with cues for hand placement and safety. Pt with noted increased posterior right LOB with rising and standing needing assist to stabilize and control balance in standing  Ambulation/Gait Ambulation/Gait assistance: Min assist Gait Distance (Feet): 80 Feet Assistive device: Rolling walker (2 wheeled) Gait Pattern/deviations: Step-through pattern;Decreased stride length;Trunk flexed   Gait velocity interpretation: <1.8 ft/sec, indicate of risk for recurrent falls General Gait Details: posterior right lean with physical assist for balance and progression of gait with cues for proximity to RW and assist to control and direct RW. Increased fatigue from last  session   Stairs             Wheelchair Mobility    Modified Rankin (Stroke Patients Only)       Balance Overall balance assessment: Needs assistance Sitting-balance support: Bilateral upper extremity supported;Feet supported Sitting balance-Leahy Scale: Fair     Standing balance support: Bilateral upper extremity supported Standing balance-Leahy Scale: Poor Standing balance comment: physical assist and bil UE support for standing today                            Cognition Arousal/Alertness: Awake/alert Behavior During Therapy: Flat affect Overall Cognitive Status: Impaired/Different from baseline Area of Impairment: Memory;Orientation;Safety/judgement                 Orientation Level: Time;Place   Memory: Decreased short-term memory   Safety/Judgement: Decreased awareness of deficits;Decreased awareness of safety            Exercises Total Joint Exercises Hip ABduction/ADduction: AAROM;Both;Seated;20 reps Long Arc Quad: AROM;Both;Seated;20 reps Marching in Standing: AROM;Both;Seated;20 reps    General Comments        Pertinent Vitals/Pain Pain Assessment: No/denies pain    Home Living                      Prior Function            PT Goals (current goals can now be found in the care plan section) Progress towards PT goals: Progressing toward goals    Frequency    Min 2X/week      PT Plan Current  plan remains appropriate    Co-evaluation              AM-PAC PT "6 Clicks" Mobility   Outcome Measure  Help needed turning from your back to your side while in a flat bed without using bedrails?: A Little Help needed moving from lying on your back to sitting on the side of a flat bed without using bedrails?: A Little Help needed moving to and from a bed to a chair (including a wheelchair)?: A Little Help needed standing up from a chair using your arms (e.g., wheelchair or bedside chair)?: A Little Help  needed to walk in hospital room?: A Little Help needed climbing 3-5 steps with a railing? : A Lot 6 Click Score: 17    End of Session Equipment Utilized During Treatment: Gait belt Activity Tolerance: Patient tolerated treatment well Patient left: in chair;with chair alarm set;with call bell/phone within reach Nurse Communication: Mobility status PT Visit Diagnosis: Other abnormalities of gait and mobility (R26.89);Difficulty in walking, not elsewhere classified (R26.2)     Time: [id number] PT Time Calculation (min) (ACUTE ONLY): 19 min  Charges:  $Gait Training: 8-22 mins                     Bayard Males, PT Acute Rehabilitation Services Pager: [phone] Office: [place] 09/11/2019, 12:53 PM

## 2019-09-11 NOTE — Plan of Care (Signed)
  Problem: Nutrition: Goal: Adequate nutrition will be maintained Outcome: Progressing   Problem: Elimination: Goal: Will not experience complications related to bowel motility Outcome: Progressing Goal: Will not experience complications related to urinary retention Outcome: Progressing   

## 2019-09-11 NOTE — Progress Notes (Signed)
Progress Note    Cameron [name]  [id number] DOB: 05-Jun-1926  DOA: 09/06/2019 PCP: Lavone Orn, MD    Brief Narrative:     Medical records reviewed and are as summarized below:  Cameron [name] is an 86 y.o. male from [place].  He presents with medical history significant of dementia, nonischemic cardiomyopathy, COPD, A. fib on Xarelto, GERD, arthritis, hypothyroidism presented to the ED via EMS for evaluation of left-sided upper tooth pain.  Patient reports having slight discomfort in his tooth.  He has been improving with IV antibiotics but his hospital stay has been complicated by hyponatremia.   Assessment/Plan:   Principal Problem:   Facial cellulitis Active Problems:   Dementia (HCC)   Atrial fibrillation (HCC)   Hyponatremia    Odontogenic left facial cellulitis:  - CT showing evidence of odontogenic left facial cellulitis and Carious left maxillary molar with associated maxillary alveolus cortical breakthrough and regional left face phlegmon with trace soft tissue gas.  - s/p Zosyn-changed to p.o. Augmentin this a.m. 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 from 124--->128 -Urine sodium greater than 100 -I have implemented fluid restriction  Nonischemic cardiomyopathy:  -No significant elevation of BNP -Daily weights not being done -Laying flat on side in bed   Dementia:  -Continue home Aricept  COPD:  -Stable.  No wheezing, cough, or dyspnea.  Order DuoNebs as needed.  A. Fib: Permanent -Stable.   -Continue Cardizem for rate control.   -Resume Xarelto as no intervention currently planned  Hypothyroidism:  Continue home Synthroid  Currently need for discharge planning:  Patient from ALF but PT recommending SNF or supervision 24/7.  Will need to decide on the safest discharge plan for the patient as he is nearing discharge in the next 24 to 48 hours   Family Communication/Anticipated D/C date and plan/Code Status   DVT prophylaxis: Xarelto Code Status: Full Code.  Family Communication: called son Inocente Salles- [place] 3/15- left message Disposition Plan: Working on safe discharge plan: ALF with 24/7 supervision or skilled nursing facility  Medical Consultants:    Dentist, Dr. Haig Prophet   Subjective:   States his appetite is good, no shortness of breath  Objective:    Vitals:   09/10/19 1922 09/11/19 0309 09/11/19 0758 09/11/19 1220  BP: (!) 130/50 129/62 (!) 123/55 (!) 125/57  Pulse: 66 70 66 68  Resp: 17  18 18   Temp: (!) 97.4 F (36.3 C) 97.6 F (36.4 C) (!) 97.4 F (36.3 C) (!) 97.5 F (36.4 C)  TempSrc: Oral Oral Oral Oral  SpO2: 100% 97% 97% 97%  Weight:      Height:        Intake/Output Summary (Last 24 hours) at 09/11/2019 1628 Last data filed at 09/11/2019 1500 Gross per 24 hour  Intake 1049.83 ml  Output 1200 ml  Net -150.17 ml   Filed Weights   09/06/19 2048 09/10/19 1347  Weight: 74.8 kg 69.2 kg    Exam: In bed, no acute distress Alert and pleasant No increased work of breathing, no wheezing No redness or swelling on the left side of his face   Data Reviewed:   I have personally reviewed following labs and imaging studies:  Labs: Labs show the following:  Basic Metabolic Panel: Recent Labs  Lab 09/08/19 0408 09/08/19 0408 09/09/19 0301 09/09/19 0301 09/10/19 0333 09/10/19 0333 09/10/19 1806 09/11/19 0255  NA 131*  --  130*  --  126*  --  124* 128*  K 3.9   < > 3.6   < > 3.9   < > 4.1 3.7  CL 96*  --  96*  --  92*  --  94* 96*  CO2 23  --  24  --  24  --  20* 21*  GLUCOSE 82  --  104*  --  101*  --  145* 89  BUN 9  --  14  --  9  --  7* 8  CREATININE 1.11  --  1.11  --  1.09  --  1.12 1.05  CALCIUM 8.5*  --   8.3*  --  8.4*  --  8.4* 8.2*   < > = values in this interval not displayed.   GFR Estimated Creatinine Clearance: 44.9 mL/min (by C-G formula based on SCr of 1.05 mg/dL). Liver Function Tests: Recent Labs  Lab 09/06/19 2210  AST 12*  ALT 9  ALKPHOS 60  BILITOT 0.8  PROT 6.9  ALBUMIN 3.2*   No results for input(s): LIPASE, AMYLASE in the last 168 hours. No results for input(s): AMMONIA in the last 168 hours. Coagulation profile No results for input(s): INR, PROTIME in the last 168 hours.  CBC: Recent Labs  Lab 09/06/19 2210 09/08/19 0408 09/09/19 0301  WBC 8.5 8.0 7.3  NEUTROABS 6.1  --   --   HGB 12.4* 11.5* 12.0*  HCT 37.2* 33.3* 34.0*  MCV 99.7 97.9 95.0  PLT 207 183 185   Cardiac Enzymes: No results for input(s): CKTOTAL, CKMB, CKMBINDEX, TROPONINI in the last 168 hours. BNP (last 3 results) No results for input(s): PROBNP in the last 8760 hours. CBG: Recent Labs  Lab 09/07/19 1317  GLUCAP 107*   D-Dimer: No results for input(s): DDIMER in the last 72 hours. Hgb A1c: No results for input(s): HGBA1C in the last 72 hours. Lipid Profile: No results for input(s): CHOL, HDL, LDLCALC, TRIG, CHOLHDL, LDLDIRECT in the last 72 hours. Thyroid function studies: No results for input(s): TSH, T4TOTAL, T3FREE, THYROIDAB in the last 72 hours.  Invalid input(s): FREET3 Anemia work up: No results for input(s): VITAMINB12, FOLATE, FERRITIN, TIBC, IRON, RETICCTPCT in the last 72 hours. Sepsis Labs: Recent Labs  Lab 09/06/19 2210 09/06/19 2211 09/08/19 0408 09/09/19 0301  WBC 8.5  --  8.0 7.3  LATICACIDVEN  --  1.6  --   --     Microbiology No results found for this or any previous visit (from the past 240 hour(s)).  Procedures and diagnostic studies:  No results found.  Medications:   . amoxicillin-clavulanate  1 tablet Oral Q12H  . calcium-vitamin D  1 tablet Oral Q breakfast  . chlorhexidine  15 mL Mouth Rinse BID  . darifenacin  15 mg Oral Daily  .  diltiazem  120 mg Oral Daily  . donepezil  10 mg Oral QHS  . escitalopram  5 mg Oral Daily  . levothyroxine  75 mcg Oral QAC breakfast  . mouth rinse  15 mL Mouth Rinse q12n4p  . pantoprazole  40 mg Oral Daily  . Rivaroxaban  15 mg Oral Q supper  . tamsulosin  0.4 mg Oral Daily  . vitamin B-12  1,000 mcg Oral Daily   Continuous Infusions:    LOS: 5 days  Geradine Girt  [facility]   How to contact the [facility] Attending or Consulting provider [place] or covering provider during after hours [place], for this patient?  1. Check the care team in [facility] and look for a) attending/consulting TRH provider listed and b) the [facility] team listed 2. Log into [email or web address] and use [place]'s universal password to access. If you do not have the password, please contact the hospital operator. 3. Locate the [facility] provider you are looking for under [facility] and page to a number that you can be directly reached. 4. If you still have difficulty reaching the provider, please page the [facility] (Director on Call) for the Hospitalists listed on amion for assistance.  09/11/2019, 4:28 PM

## 2019-09-11 NOTE — Discharge Instructions (Signed)

## 2019-09-12 LAB — BASIC METABOLIC PANEL
Anion gap: 10 (ref 5–15)
BUN: 7 mg/dL — ABNORMAL LOW (ref 8–23)
CO2: 22 mmol/L (ref 22–32)
Calcium: 8.5 mg/dL — ABNORMAL LOW (ref 8.9–10.3)
Chloride: 96 mmol/L — ABNORMAL LOW (ref 98–111)
Creatinine, Ser: 0.98 mg/dL (ref 0.61–1.24)
GFR calc Af Amer: >60 mL/min (ref >60)
GFR calc non Af Amer: >60 mL/min (ref >60)
Glucose, Bld: 100 mg/dL — ABNORMAL HIGH (ref 70–99)
Potassium: 4.1 mmol/L (ref 3.5–5.1)
Sodium: 128 mmol/L — ABNORMAL LOW (ref 135–145)

## 2019-09-12 NOTE — NC FL2 (Signed)
[place] LEVEL OF CARE SCREENING TOOL     IDENTIFICATION  Patient Name: Cameron [name] Birthdate: 03/15/1926 Sex: male Admission Date (Current Location): 09/06/2019  [facility] and [place] Number:  Herbalist and Address:  [facility], [place] [address] [place], [place] [address]      Provider Number: [id number]  Attending Physician Name and Address:  Geradine Girt, DO  Relative Name and Phone Number:  SamTurner [phone]    Current Level of Care: Hospital Recommended Level of Care: [place] Prior Approval Number:    Date Approved/Denied: 07/17/13 PASRR Number: [id number]  Discharge Plan: SNF    Current Diagnoses: Patient Active Problem List   Diagnosis Date Noted  . Anemia 09/07/2019  . Hyponatremia 09/07/2019  . Facial cellulitis 09/06/2019  . Anticoagulated 05/06/2017  . Spells of decreased attentiveness 04/01/2017  . Community acquired pneumonia   . Chronic diastolic heart failure ([place])   . Physical deconditioning   . Influenza-like illness 05/27/2015  . Atrial fibrillation ([place]) 08/23/2013  . CAP (community acquired pneumonia) 07/17/2013  . Dementia ([place]) 07/12/2013  . Weakness generalized 07/12/2013  . Diarrhea 07/12/2013  . Protein-calorie malnutrition, severe ([place]) 03/30/2013  . PNA (pneumonia) 03/28/2013  . Fever 03/27/2013  . Hypotension 03/27/2013  . Sepsis ([place]) 03/26/2013  . FUO (fever of unknown origin) 03/26/2013  . NICM (nonischemic cardiomyopathy) EF 30% 03/26/2013  . Chronic diastolic HF (heart failure) ([place]) 03/26/2013  . Persistent atrial fibrillation ([place]) 10/04/2012  . Acute systolic heart failure ([place]) 10/04/2012  . ALLERGIC RHINITIS 03/15/2007  . BRONCHIECTASIS, W/O ACUTE EXACERBATION 03/15/2007  . PULMONARY FIBROSIS, POSTINFLAMMATORY 03/15/2007    Orientation RESPIRATION BLADDER Height & Weight     Self, Time  Normal Incontinent Weight: 70.5 kg Height:  5'  10.5" (179.1 cm)  BEHAVIORAL SYMPTOMS/MOOD NEUROLOGICAL BOWEL NUTRITION STATUS      Continent Diet(refer to d/c summary)  AMBULATORY STATUS COMMUNICATION OF NEEDS Skin   Extensive Assist Verbally Other (Comment)(Facial cellulitis)                       Personal Care Assistance Level of Assistance  Bathing, Feeding, Dressing Bathing Assistance: Limited assistance Feeding assistance: Independent Dressing Assistance: Limited assistance     Functional Limitations Info  Sight, Hearing, Speech Sight Info: Adequate Hearing Info: Adequate Speech Info: Adequate    SPECIAL CARE FACTORS FREQUENCY  PT (By licensed PT), OT (By licensed OT)     PT Frequency: 5x/ week , evaluate and treat OT Frequency: 5x/week , evaluate and treat            Contractures Contractures Info: Not present    Additional Factors Info  Code Status, Allergies Code Status Info: Full code Allergies Info: No Known Allergies           Current Medications (09/12/2019):  This is the current hospital active medication list Current Facility-Administered Medications  Medication Dose Route Frequency Provider Last Rate Last Admin  . acetaminophen (TYLENOL) tablet 650 mg  650 mg Oral Q6H PRN Shela Leff, MD   650 mg at 09/11/19 2151   Or  . acetaminophen (TYLENOL) suppository 650 mg  650 mg Rectal Q6H PRN Shela Leff, MD      . amoxicillin-clavulanate (AUGMENTIN) 875-125 MG per tablet 1 tablet  1 tablet Oral Q12H Vann, Jessica U, DO   1 tablet at 09/12/19 0924  . calcium-vitamin D (OSCAL WITH D) 500-200 MG-UNIT per tablet 1 tablet  1 tablet Oral Q breakfast Shela Leff, MD   1 tablet at 09/12/19 0900  . chlorhexidine (PERIDEX) 0.12 % solution 15 mL  15 mL Mouth Rinse BID Eulogio Bear U, DO   15 mL at 09/12/19 0924  . darifenacin (ENABLEX) 24 hr tablet 15 mg  15 mg Oral Daily Shela Leff, MD   15 mg at 09/12/19 0923  . diltiazem (CARDIZEM CD) 24 hr capsule 120 mg  120 mg Oral Daily  Shela Leff, MD   120 mg at 09/12/19 0924  . donepezil (ARICEPT) tablet 10 mg  10 mg Oral QHS Shela Leff, MD   10 mg at 09/11/19 2153  . escitalopram (LEXAPRO) tablet 5 mg  5 mg Oral Daily Shela Leff, MD   5 mg at 09/12/19 0923  . ipratropium-albuterol (DUONEB) 0.5-2.5 (3) MG/3ML nebulizer solution 3 mL  3 mL Nebulization Q6H PRN Shela Leff, MD      . levothyroxine (SYNTHROID) tablet 75 mcg  75 mcg Oral QAC breakfast Shela Leff, MD   75 mcg at 09/12/19 0854  . loratadine (CLARITIN) tablet 10 mg  10 mg Oral Daily PRN Shela Leff, MD      . MEDLINE mouth rinse  15 mL Mouth Rinse q12n4p Vann, Jessica U, DO   15 mL at 09/12/19 1200  . pantoprazole (PROTONIX) EC tablet 40 mg  40 mg Oral Daily Shela Leff, MD   40 mg at 09/12/19 0924  . Rivaroxaban (XARELTO) tablet 15 mg  15 mg Oral Q supper Eulogio Bear U, DO   15 mg at 09/11/19 1710  . tamsulosin (FLOMAX) capsule 0.4 mg  0.4 mg Oral Daily Shela Leff, MD   0.4 mg at 09/12/19 0923  . vitamin B-12 (CYANOCOBALAMIN) tablet 1,000 mcg  1,000 mcg Oral Daily Eulogio Bear U, DO   1,000 mcg at 09/12/19 [id number]     Discharge Medications: Please see discharge summary for a list of discharge medications.  Relevant Imaging Results:  Relevant Lab Results:   Additional Information SS# [id number]  Sharin Mons, RN

## 2019-09-12 NOTE — Progress Notes (Signed)
Progress Note    Cameron [name]  [id number] DOB: Jun 17, 1926  DOA: 09/06/2019 PCP: Lavone Orn, MD    Brief Narrative:    Medical records reviewed and are as summarized below:  Cameron [name] is an 86 y.o. male from [place] with a past medical history of dementia, nonischemic cardiomyopathy, COPD, A. fib on Xarelto, GERD, arthritis, hypothyroidism presented 3/10 to the emergency department via EMS for evaluation of left-sided upper tooth pain.  He reported having slight discomfort in his tooth.  Provided with IV antibiotics with improvement but his hospital stay complicated by hyponatremia.  Assessment/Plan:   Principal Problem:   Facial cellulitis Active Problems:   Dementia ([name])   Atrial fibrillation (HCC)   Hyponatremia  #1.  Facial cellulitis.  CT reveals evidence of odontogenic left facial cellulitis with carious left maxillary molar with associated maxillary alveolus cortical breakthrough and regional left face phlegmon with trace of soft tissue gas.  Was provided with Zosyn that was changed to p.o. Augmentin on March 15.  Edema and erythema resolved.  Will need outpatient dental/oral surgery follow-up for extraction of the tooth.  As previously discussed with on-call dentist who opined no current need for extraction immediately but will need follow-up with dentist as early as next week. -continue augmentin to complete 10 day course (6/10 today)  2.  Hyponatremia.  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  Sodium stable today at 128.  Urine sodium greater than 100. -Continue fluid restriction -Monitor intake and output  #3.  Nonischemic cardiomyopathy.  Home meds include lasix -holding home  lasix -Monitor  #4.  A. fib.  Rate controlled.  Home medications include Cardizem and Xarelto -Continue home meds -Monitor  #5.  COPD.  Not on home oxygen.  Stable. -As needed nebs  #6.  Dementia.  Stable.  Home medications include Aricept -Continue home meds  Continues with the  need for discharge planning.  Patient from ALF but PT recommending SNF or supervision 24/7.  Transition of care team    Family Communication/Anticipated D/C date and plan/Code Status   DVT prophylaxis: Xarelto ordered. Code Status: Full Code.  Family Communication:  Disposition Plan: to be determined   Medical Consultants:    Dr. Haig Prophet dentist   Anti-Infectives:    Subjective:   Awake alert denies pain/discomfort  Objective:    Vitals:   09/11/19 1220 09/11/19 1951 09/12/19 0432 09/12/19 0801  BP: (!) 125/57 135/69 129/65 130/65  Pulse: 68 72 71 69  Resp: 18 16 16 16   Temp: (!) 97.5 F (36.4 C) 97.9 F (36.6 C) 98.1 F (36.7 C) 98.4 F (36.9 C)  TempSrc: Oral Oral Oral Oral  SpO2: 97% 100% 99% 100%  Weight:   70.5 kg   Height:        Intake/Output Summary (Last 24 hours) at 09/12/2019 1424 Last data filed at 09/12/2019 1300 Gross per 24 hour  Intake 480 ml  Output 2200 ml  Net -1720 ml   Filed Weights   09/06/19 2048 09/10/19 1347 09/12/19 0432  Weight: 74.8 kg 69.2 kg 70.5 kg    Exam: General: Awake alert calm cooperative no acute distress CV: Irregularly irregular no murmur gallop or rub no lower extremity edema Respiratory: No increased work of breathing breath sounds are clear bilaterally hear no wheeze no rhonchi Abdomen soft nondistended positive bowel sounds throughout nontender to palpation no guarding or rebounding Musculoskeletal: Joints without swelling/erythema full range of motion moves all extremities spontaneously HEENT: His membranes moist and  pink no swelling/erythema on his left eye or the left side of his face.  Data Reviewed:   I have personally reviewed following labs and imaging studies:  Labs: Labs show the following:   Basic Metabolic Panel: Recent Labs  Lab 09/10/19 0333 09/10/19 0333 09/10/19 1806 09/10/19 1806 09/11/19 0255 09/11/19 0255 09/11/19 1716 09/12/19 0428  NA 126*  --  124*  --  128*  --  128* 128*   K 3.9   < > 4.1   < > 3.7   < > 4.1 4.1  CL 92*  --  94*  --  96*  --  96* 96*  CO2 24  --  20*  --  21*  --  23 22  GLUCOSE 101*  --  145*  --  89  --  103* 100*  BUN 9  --  7*  --  8  --  7* 7*  CREATININE 1.09  --  1.12  --  1.05  --  1.10 0.98  CALCIUM 8.4*  --  8.4*  --  8.2*  --  8.2* 8.5*   < > = values in this interval not displayed.   GFR Estimated Creatinine Clearance: 49 mL/min (by C-G formula based on SCr of 0.98 mg/dL). Liver Function Tests: Recent Labs  Lab 09/06/19 2210  AST 12*  ALT 9  ALKPHOS 60  BILITOT 0.8  PROT 6.9  ALBUMIN 3.2*   No results for input(s): LIPASE, AMYLASE in the last 168 hours. No results for input(s): AMMONIA in the last 168 hours. Coagulation profile No results for input(s): INR, PROTIME in the last 168 hours.  CBC: Recent Labs  Lab 09/06/19 2210 09/08/19 0408 09/09/19 0301  WBC 8.5 8.0 7.3  NEUTROABS 6.1  --   --   HGB 12.4* 11.5* 12.0*  HCT 37.2* 33.3* 34.0*  MCV 99.7 97.9 95.0  PLT 207 183 185   Cardiac Enzymes: No results for input(s): CKTOTAL, CKMB, CKMBINDEX, TROPONINI in the last 168 hours. BNP (last 3 results) No results for input(s): PROBNP in the last 8760 hours. CBG: Recent Labs  Lab 09/07/19 1317  GLUCAP 107*   D-Dimer: No results for input(s): DDIMER in the last 72 hours. Hgb A1c: No results for input(s): HGBA1C in the last 72 hours. Lipid Profile: No results for input(s): CHOL, HDL, LDLCALC, TRIG, CHOLHDL, LDLDIRECT in the last 72 hours. Thyroid function studies: No results for input(s): TSH, T4TOTAL, T3FREE, THYROIDAB in the last 72 hours.  Invalid input(s): FREET3 Anemia work up: No results for input(s): VITAMINB12, FOLATE, FERRITIN, TIBC, IRON, RETICCTPCT in the last 72 hours. Sepsis Labs: Recent Labs  Lab 09/06/19 2210 09/06/19 2211 09/08/19 0408 09/09/19 0301  WBC 8.5  --  8.0 7.3  LATICACIDVEN  --  1.6  --   --     Microbiology No results found for this or any previous visit (from the  past 240 hour(s)).  Procedures and diagnostic studies:  No results found.  Medications:   . amoxicillin-clavulanate  1 tablet Oral Q12H  . calcium-vitamin D  1 tablet Oral Q breakfast  . chlorhexidine  15 mL Mouth Rinse BID  . darifenacin  15 mg Oral Daily  . diltiazem  120 mg Oral Daily  . donepezil  10 mg Oral QHS  . escitalopram  5 mg Oral Daily  . levothyroxine  75 mcg Oral QAC breakfast  . mouth rinse  15 mL Mouth Rinse q12n4p  . pantoprazole  40 mg Oral Daily  .  Rivaroxaban  15 mg Oral Q supper  . tamsulosin  0.4 mg Oral Daily  . vitamin B-12  1,000 mcg Oral Daily   Continuous Infusions:   LOS: 6 days   Radene Gunning NP  [facility]   How to contact the [facility] Attending or Consulting provider [place] or covering provider during after hours [place], for this patient?  1. Check the care team in [facility] and look for a) attending/consulting TRH provider listed and b) the [facility] team listed 2. Log into [email or web address] and use [place]'s universal password to access. If you do not have the password, please contact the hospital operator. 3. Locate the [facility] provider you are looking for under [facility] and page to a number that you can be directly reached. 4. If you still have difficulty reaching the provider, please page the [facility] (Director on Call) for the Hospitalists listed on amion for assistance.  09/12/2019, 2:24 PM

## 2019-09-12 NOTE — Progress Notes (Addendum)
NCM received call from HTA,  ambulance authorization: [phone] provided per  Liam Rogers, UM. Whitman Hero RN,BSN,CM [phone]

## 2019-09-12 NOTE — TOC Initial Note (Signed)
Transition of Care [facility]) - Initial/Assessment Note    Patient Details  Name: Cameron [name] MRN: [id number] Date of Birth: 03/11/1926  Transition of Care [facility]) CM/SW Contact:    Sharin Mons, RN Phone [phone] 09/12/2019, 4:34 PM  Clinical Narrative:                 RNCM received consult for possible SNF placement at time of discharge. RNCM spoke with patient's son Cameron [name] regarding PT recommendation of SNF placement at time of discharge. Cameron [name] expressed understanding of PT recommendation and is agreeable to SNF placement at time of discharge for dad. Cameron [name] without  preference for  SNF  . RNCM discussed insurance authorization process and provided Medicare SNF ratings list. Cameron [name] expressed being hopeful for rehab and dad to feel better soon. No further questions reported at this time. RNCM to continue to follow and assist with discharge planning needs.  Dalonte Quiceno [facility])    [phone]     Referral for SNF faxed out ... TOC  to f/u  Expected Discharge Plan: Skilled Nursing Facility(vs  ALF with [facility], 24/7 PCS) Barriers to Discharge: Continued Medical Work up   Patient Goals and CMS Choice   CMS Medicare.gov Compare Post Acute Care list provided to:: Patient Represenative (must comment)    Expected Discharge Plan and Services Expected Discharge Plan: Skilled Nursing Facility(vs  ALF with HH, 24/7 PCS)                                              Prior Living Arrangements/Services                       Activities of Daily Living Home Assistive Devices/Equipment: Eyeglasses, Environmental consultant (specify type), Shower chair without back ADL Screening (condition at time of admission) Patient's cognitive ability adequate to safely complete daily activities?: Yes Is the patient deaf or have difficulty hearing?: No Does the patient have difficulty seeing, even when wearing glasses/contacts?: Yes Does the patient have difficulty concentrating, remembering, or making  decisions?: Yes Patient able to express need for assistance with ADLs?: Yes Does the patient have difficulty dressing or bathing?: No Independently performs ADLs?: Yes (appropriate for developmental age) Does the patient have difficulty walking or climbing stairs?: No Weakness of Legs: None Weakness of Arms/Hands: None  Permission Sought/Granted                  Emotional Assessment              Admission diagnosis:  Abscess [L02.91] Facial cellulitis [L03.211] Patient Active Problem List   Diagnosis Date Noted  . Anemia 09/07/2019  . Hyponatremia 09/07/2019  . Facial cellulitis 09/06/2019  . Anticoagulated 05/06/2017  . Spells of decreased attentiveness 04/01/2017  . Community acquired pneumonia   . Chronic diastolic heart failure ([place])   . Physical deconditioning   . Influenza-like illness 05/27/2015  . Atrial fibrillation ([place]) 08/23/2013  . CAP (community acquired pneumonia) 07/17/2013  . Dementia ([place]) 07/12/2013  . Weakness generalized 07/12/2013  . Diarrhea 07/12/2013  . Protein-calorie malnutrition, severe ([place]) 03/30/2013  . PNA (pneumonia) 03/28/2013  . Fever 03/27/2013  . Hypotension 03/27/2013  . Sepsis ([place]) 03/26/2013  . FUO (fever of unknown origin) 03/26/2013  . NICM (nonischemic cardiomyopathy) EF 30% 03/26/2013  . Chronic diastolic HF (heart failure) ([place]) 03/26/2013  . Persistent atrial  fibrillation ([place]) 10/04/2012  . Acute systolic heart failure ([place]) 10/04/2012  . ALLERGIC RHINITIS 03/15/2007  . BRONCHIECTASIS, W/O ACUTE EXACERBATION 03/15/2007  . PULMONARY FIBROSIS, POSTINFLAMMATORY 03/15/2007   PCP:  Lavone Orn, MD Pharmacy:   [place], [place] - [address] [place] [address] Phone: [phone] Fax: [phone]  [facility] [place], [place] [address] Phone: [phone] Fax:  [phone]     Social Determinants of Health (SDOH) Interventions    Readmission Risk Interventions No flowsheet data found.

## 2019-09-12 NOTE — Progress Notes (Signed)
Occupational Therapy Treatment Patient Details Name: Cameron [name] MRN: [id number] DOB: 1926-03-09 Today's Date: 09/12/2019    History of present illness 86 yo admitted with left upper tooth pain with odontogenic facial cellulitis. PMHx: dementia, CHF, Afib, NICM, COPD, GERD, arthritis   OT comments  Pt progressing toward established OT goals, but due to instability and cognitive limitations, updated d/c recommendation to SNF to ensure pt has 24/7 physical assistance. Pt required minA for bed mobility and minA with functional mobility at RW level with consistent cues for safe hand placement and safe use of RW. Pt with posterior bias and takes very small shuffling steps during mobility. Pt's cognitive limitations and physical limitations put him at an increased risk for falling. Pt required setupA for lunch. Pt will continue to benefit from skilled OT services to maximize safety and independence with ADL/IADL and functional mobility. Will continue to follow acutely and progress as tolerated.    Follow Up Recommendations  SNF    Equipment Recommendations  None recommended by OT    Recommendations for Other Services      Precautions / Restrictions Precautions Precautions: Fall Restrictions Weight Bearing Restrictions: No       Mobility Bed Mobility Overal bed mobility: Needs Assistance Bed Mobility: Supine to Sit     Supine to sit: Min assist     General bed mobility comments: min assist to elevate trunk from surface with increased time and cues for initiation  Transfers Overall transfer level: Needs assistance Equipment used: Rolling walker (2 wheeled) Transfers: Sit to/from Stand Sit to Stand: Min assist         General transfer comment: minA for powerup and for stability in stnading;pt with posterior bias    Balance Overall balance assessment: Needs assistance Sitting-balance support: Bilateral upper extremity supported;Feet supported Sitting balance-Leahy  Scale: Fair     Standing balance support: Bilateral upper extremity supported Standing balance-Leahy Scale: Poor Standing balance comment: physical assist and bil UE support for standing today                           ADL either performed or assessed with clinical judgement   ADL Overall ADL's : Needs assistance/impaired Eating/Feeding: Set up;Sitting Eating/Feeding Details (indicate cue type and reason): set pt up with lunch in chair Grooming: Minimal assistance;Standing Grooming Details (indicate cue type and reason): fatigues quickly, requires external support                 Toilet Transfer: Minimal assistance;Cueing for safety;Ambulation;RW Toilet Transfer Details (indicate cue type and reason): cues for safe hand placement and safe use of RW Toileting- Clothing Manipulation and Hygiene: Minimal assistance       Functional mobility during ADLs: Minimal assistance;Rolling walker;Cueing for safety General ADL Comments: decreased balance, decreased safety awareness,     Vision       Perception     Praxis      Cognition Arousal/Alertness: Awake/alert Behavior During Therapy: Flat affect Overall Cognitive Status: No family/caregiver present to determine baseline cognitive functioning Area of Impairment: Memory;Safety/judgement                     Memory: Decreased short-term memory   Safety/Judgement: Decreased awareness of deficits;Decreased awareness of safety              Exercises     Shoulder Instructions       General Comments vss;provided pt with chair alarm belt, educated pt  on how to get out of belt    Pertinent Vitals/ Pain       Pain Assessment: No/denies pain  Home Living                                          Prior Functioning/Environment              Frequency  Min 2X/week        Progress Toward Goals  OT Goals(current goals can now be found in the care plan section)  Progress  towards OT goals: Progressing toward goals  Acute Rehab OT Goals Patient Stated Goal: return home OT Goal Formulation: With patient Time For Goal Achievement: 09/22/19 Potential to Achieve Goals: Good ADL Goals Pt Will Perform Grooming: with supervision;standing Pt Will Perform Upper Body Dressing: with supervision;sitting Pt Will Perform Lower Body Dressing: with supervision;sit to/from stand Pt Will Transfer to Toilet: with supervision;ambulating Pt Will Perform Toileting - Clothing Manipulation and hygiene: with modified independence;sit to/from stand  Plan Discharge plan needs to be updated    Co-evaluation                 AM-PAC OT "6 Clicks" Daily Activity     Outcome Measure   Help from another person eating meals?: None Help from another person taking care of personal grooming?: A Little Help from another person toileting, which includes using toliet, bedpan, or urinal?: A Little Help from another person bathing (including washing, rinsing, drying)?: A Little Help from another person to put on and taking off regular upper body clothing?: A Little Help from another person to put on and taking off regular lower body clothing?: A Lot 6 Click Score: 18    End of Session Equipment Utilized During Treatment: Gait belt;Rolling walker  OT Visit Diagnosis: Unsteadiness on feet (R26.81);Other abnormalities of gait and mobility (R26.89);Muscle weakness (generalized) (M62.81);Other symptoms and signs involving cognitive function   Activity Tolerance Patient tolerated treatment well   Patient Left in chair;with call bell/phone within reach;with chair alarm set   Nurse Communication Mobility status        Time: [id number] OT Time Calculation (min): 17 min  Charges: OT General Charges $OT Visit: 1 Visit OT Treatments $Self Care/Home Management : 8-22 mins  Helene Kelp OTR/L Acute Rehabilitation Services Office: [place] 09/12/2019, 12:57  PM

## 2019-09-13 LAB — BASIC METABOLIC PANEL
Anion gap: 9 (ref 5–15)
BUN: 9 mg/dL (ref 8–23)
CO2: 21 mmol/L — ABNORMAL LOW (ref 22–32)
Calcium: 8.5 mg/dL — ABNORMAL LOW (ref 8.9–10.3)
Chloride: 95 mmol/L — ABNORMAL LOW (ref 98–111)
Creatinine, Ser: 0.96 mg/dL (ref 0.61–1.24)
GFR calc Af Amer: >60 mL/min (ref >60)
GFR calc non Af Amer: >60 mL/min (ref >60)
Glucose, Bld: 104 mg/dL — ABNORMAL HIGH (ref 70–99)
Potassium: 4.1 mmol/L (ref 3.5–5.1)
Sodium: 125 mmol/L — ABNORMAL LOW (ref 135–145)

## 2019-09-13 LAB — SARS CORONAVIRUS 2 (TAT 6-24 HRS): SARS Coronavirus 2: NEGATIVE

## 2019-09-13 NOTE — Progress Notes (Signed)
[facility]  PROGRESS NOTE    Cameron [name]  [id number] DOB: 07-13-1925 DOA: 09/06/2019 PCP: Lavone Orn, MD   Brief Narrative:   Cameron [name] is an 86 y.o. male from [place].  He presents with medical history significant ofdementia,nonischemic cardiomyopathy, COPD, A. fib on Xarelto, GERD, arthritis, hypothyroidism presented to the ED via EMS for evaluation of left-sided upper tooth pain.Patient reports having slight discomfort in his tooth.  He has been improving with IV antibiotics but his hospital stay has been complicated by hyponatremia.   Assessment & Plan:   Principal Problem:   Facial cellulitis Active Problems:   Dementia ([place])   Atrial fibrillation ([facility])   Hyponatremia  Odontogenic left facial cellulitis:      -CT showing evidence of odontogenic left facial cellulitis and Carious left maxillary molar with associated maxillary alveolus cortical breakthrough and regional left face phlegmon with trace soft tissue gas.     -s/p Zosyn-changed to p.o. 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      - No wheezing, cough, or dyspnea.     - PRN duonebs, denies complaints  A. Fib (Permanent)     - Stable.      - Continue Cardizem for rate control.      - Xarelto  Hypothyroidism      - Continue home synthroid  DVT prophylaxis: xarelto Code Status: FULL Family Communication: Called son Isaiah Brasuell [phone]) to update.  Only received VM.  Disposition Plan: Discharge to SNF when available.  Antimicrobials:  . Augmentin   ROS:  Denies CP, N, V, dyspnea. Remainder 10-pt ROS is negative for all not previously mentioned.  Subjective: "It's better."  Objective: Vitals:   09/13/19 0430 09/13/19 0613 09/13/19 0859 09/13/19 1355  BP: 130/70  (!) 141/64 (!) 123/51  Pulse: 76  72 64  Resp: 18  16 16   Temp: 97.8 F (36.6 C)  98.6 F (37 C) 98.4 F (36.9 C)  TempSrc: Oral  Oral Oral  SpO2: 99%  98% 99%  Weight:  70.6 kg    Height:        Intake/Output Summary (Last 24 hours) at 09/13/2019 1446 Last data filed at 09/13/2019 1238 Gross per 24 hour  Intake 720 ml  Output 1200 ml  Net -480 ml   Filed Weights   09/10/19 1347 09/12/19 0432 09/13/19 0613  Weight: 69.2 kg 70.5 kg 70.6 kg    Examination:  General: 86 y.o. male resting in bed in NAD Cardiovascular: RRR, +S1, S2, no m/g/r, equal pulses throughout Respiratory: CTABL, no w/r/r, normal WOB GI: BS+, NDNT, no masses noted, no organomegaly noted MSK: No e/c/c Neuro: Alert to name, follows commands Psyc: Appropriate interaction and affect, calm/cooperative   Data Reviewed: I have personally reviewed following labs and imaging  studies.  CBC: Recent Labs  Lab 09/06/19 2210 09/08/19 0408 09/09/19 0301  WBC 8.5 8.0 7.3  NEUTROABS 6.1  --   --   HGB 12.4* 11.5* 12.0*  HCT 37.2* 33.3* 34.0*  MCV 99.7 97.9 95.0  PLT 207 183 [id number]   Basic Metabolic Panel: Recent Labs  Lab 09/10/19 1806 09/11/19 0255 09/11/19 1716 09/12/19 0428 09/13/19 0227  NA 124* 128* 128* 128* 125*  K 4.1 3.7 4.1 4.1 4.1  CL 94* 96* 96* 96* 95*  CO2 20* 21* 23 22 21*  GLUCOSE 145* 89 103* 100* 104*  BUN 7* 8 7* 7* 9  CREATININE 1.12 1.05 1.10 0.98 0.96  CALCIUM 8.4* 8.2* 8.2* 8.5* 8.5*   GFR: Estimated Creatinine Clearance: 50 mL/min (by C-G formula based on SCr of 0.96 mg/dL). Liver Function Tests: Recent Labs  Lab 09/06/19 2210  AST 12*  ALT 9    ALKPHOS 60  BILITOT 0.8  PROT 6.9  ALBUMIN 3.2*   No results for input(s): LIPASE, AMYLASE in the last 168 hours. No results for input(s): AMMONIA in the last 168 hours. Coagulation Profile: No results for input(s): INR, PROTIME in the last 168 hours. Cardiac Enzymes: No results for input(s): CKTOTAL, CKMB, CKMBINDEX, TROPONINI in the last 168 hours. BNP (last 3 results) No results for input(s): PROBNP in the last 8760 hours. HbA1C: No results for input(s): HGBA1C in the last 72 hours. CBG: Recent Labs  Lab 09/07/19 1317  GLUCAP 107*   Lipid Profile: No results for input(s): CHOL, HDL, LDLCALC, TRIG, CHOLHDL, LDLDIRECT in the last 72 hours. Thyroid Function Tests: No results for input(s): TSH, T4TOTAL, FREET4, T3FREE, THYROIDAB in the last 72 hours. Anemia Panel: No results for input(s): VITAMINB12, FOLATE, FERRITIN, TIBC, IRON, RETICCTPCT in the last 72 hours. Sepsis Labs: Recent Labs  Lab 09/06/19 2211  LATICACIDVEN 1.6    No results found for this or any previous visit (from the past 240 hour(s)).    Radiology Studies: No results found.   Scheduled Meds: . amoxicillin-clavulanate  1 tablet Oral Q12H  . calcium-vitamin D  1 tablet Oral Q breakfast  . chlorhexidine  15 mL Mouth Rinse BID  . darifenacin  15 mg Oral Daily  . diltiazem  120 mg Oral Daily  . donepezil  10 mg Oral QHS  . escitalopram  5 mg Oral Daily  . levothyroxine  75 mcg Oral QAC breakfast  . mouth rinse  15 mL Mouth Rinse q12n4p  . pantoprazole  40 mg Oral Daily  . Rivaroxaban  15 mg Oral Q supper  . tamsulosin  0.4 mg Oral Daily  . vitamin B-12  1,000 mcg Oral Daily   Continuous Infusions:   LOS: 7 days    Time spent: 25 minutes spent in the coordination of care today.    Jonnie Finner, DO [facility]  If 7PM-7AM, please contact night-coverage [email or web address] 09/13/2019, 2:46 PM

## 2019-09-13 NOTE — Progress Notes (Signed)
Hoping to d/c pt to SNF today. Awaiting d/c summary and son completing paperwork to sign pt into facility/ [facility]. [facility] ED CSW ( cell # [phone]) to fax d/c summary to facility once available. Janie /[facility] @ [phone], nurse to call to confirm readiness to receive pt this evening and then arrange PTAR services for pt's transportation to facility. Whitman Hero RN,BSN,CM [phone]

## 2019-09-13 NOTE — TOC Progression Note (Addendum)
Transition of Care [facility]) - Progression Note    Patient Details  Name: Cameron [name] MRN: [id number] Date of Birth: 1926/03/25  Transition [facility]) CM/SW Contact  Sharin Mons, RN Phone Number: [phone] 09/13/2019, 9:08 AM  Clinical Narrative:    SNF bed offers shared with son [name]. [name] to f/u with NCM with selection this am. Updated COVID needed. NCM made nurse aware.Ship broker pending. TOC team will continue to monitor...   09/13/2019 @0953  [name] 1st choice was [facility] , however, facility with no beds available, NCM made [name] aware. Per text sent to NCM, [name]'s next choic e would be [facility]. NCM reached out to [facility] for bed offer and offer extended by liaison. NCM made [name] aware and [name] responded that he's in meetings and can't respond until later.  09/12/2016 @ 1034 NCM received call from [facility] stating their medical director Daylene Posey, [phone]) would like a peer to peer discussion for SNF approval. NCM sent text page to hospital MD to make aware.   09/13/2019 @ 11:11 am  NCM received SNF authorization approval , [id number] per [facility]. NCM made SNF and son [name] aware.   Expected Discharge Plan: Skilled Nursing Facility Barriers to Discharge: Continued Medical Work up, [place] needed)  Expected Discharge Plan and Services Expected Discharge Plan: [place]      Social Determinants of Health (SDOH) Interventions    Readmission Risk Interventions No flowsheet data found.

## 2019-09-13 NOTE — Social Work (Addendum)
EDCSW received call from [facility] @ [facility]. Unable to receive Pt after this time. Will have to postpone until AM.  EDCSW called Pts son, [name] @ [phone] to update. Left HIPPA compliant voicemail.  6:30 PM EDCSW received callback from Pts son and updated him about situation. Son expressed appreciation and thanks expecially to Jeanmarie Hubert for all of her care and coordination on Pts behalf.  Vergie Living MSW LCSWA Transitions of Care  Clinical Social Worker  [facility]  [phone]

## 2019-09-14 DIAGNOSIS — I5032 Chronic diastolic (congestive) heart failure: Secondary | ICD-10-CM | POA: Diagnosis not present

## 2019-09-14 DIAGNOSIS — Z515 Encounter for palliative care: Secondary | ICD-10-CM | POA: Diagnosis not present

## 2019-09-14 DIAGNOSIS — J189 Pneumonia, unspecified organism: Secondary | ICD-10-CM | POA: Diagnosis not present

## 2019-09-14 DIAGNOSIS — I350 Nonrheumatic aortic (valve) stenosis: Secondary | ICD-10-CM | POA: Diagnosis not present

## 2019-09-14 DIAGNOSIS — Z955 Presence of coronary angioplasty implant and graft: Secondary | ICD-10-CM | POA: Diagnosis not present

## 2019-09-14 DIAGNOSIS — E871 Hypo-osmolality and hyponatremia: Secondary | ICD-10-CM

## 2019-09-14 DIAGNOSIS — Z681 Body mass index (BMI) 19 or less, adult: Secondary | ICD-10-CM | POA: Diagnosis not present

## 2019-09-14 DIAGNOSIS — Z66 Do not resuscitate: Secondary | ICD-10-CM | POA: Diagnosis not present

## 2019-09-14 DIAGNOSIS — L0390 Cellulitis, unspecified: Secondary | ICD-10-CM | POA: Diagnosis not present

## 2019-09-14 DIAGNOSIS — R627 Adult failure to thrive: Secondary | ICD-10-CM | POA: Diagnosis not present

## 2019-09-14 DIAGNOSIS — F0390 Unspecified dementia without behavioral disturbance: Secondary | ICD-10-CM | POA: Diagnosis not present

## 2019-09-14 DIAGNOSIS — R41841 Cognitive communication deficit: Secondary | ICD-10-CM | POA: Diagnosis not present

## 2019-09-14 DIAGNOSIS — G9341 Metabolic encephalopathy: Secondary | ICD-10-CM | POA: Diagnosis not present

## 2019-09-14 DIAGNOSIS — I214 Non-ST elevation (NSTEMI) myocardial infarction: Secondary | ICD-10-CM | POA: Diagnosis not present

## 2019-09-14 DIAGNOSIS — M2550 Pain in unspecified joint: Secondary | ICD-10-CM | POA: Diagnosis not present

## 2019-09-14 DIAGNOSIS — I340 Nonrheumatic mitral (valve) insufficiency: Secondary | ICD-10-CM | POA: Diagnosis not present

## 2019-09-14 DIAGNOSIS — I469 Cardiac arrest, cause unspecified: Secondary | ICD-10-CM | POA: Diagnosis not present

## 2019-09-14 DIAGNOSIS — R4182 Altered mental status, unspecified: Secondary | ICD-10-CM | POA: Diagnosis not present

## 2019-09-14 DIAGNOSIS — Y95 Nosocomial condition: Secondary | ICD-10-CM | POA: Diagnosis not present

## 2019-09-14 DIAGNOSIS — R278 Other lack of coordination: Secondary | ICD-10-CM | POA: Diagnosis not present

## 2019-09-14 DIAGNOSIS — I428 Other cardiomyopathies: Secondary | ICD-10-CM | POA: Diagnosis not present

## 2019-09-14 DIAGNOSIS — J449 Chronic obstructive pulmonary disease, unspecified: Secondary | ICD-10-CM | POA: Diagnosis not present

## 2019-09-14 DIAGNOSIS — I4819 Other persistent atrial fibrillation: Secondary | ICD-10-CM | POA: Diagnosis not present

## 2019-09-14 DIAGNOSIS — R2681 Unsteadiness on feet: Secondary | ICD-10-CM | POA: Diagnosis not present

## 2019-09-14 DIAGNOSIS — J690 Pneumonitis due to inhalation of food and vomit: Secondary | ICD-10-CM | POA: Diagnosis not present

## 2019-09-14 DIAGNOSIS — I255 Ischemic cardiomyopathy: Secondary | ICD-10-CM | POA: Diagnosis not present

## 2019-09-14 DIAGNOSIS — I6340 Cerebral infarction due to embolism of unspecified cerebral artery: Secondary | ICD-10-CM | POA: Diagnosis not present

## 2019-09-14 DIAGNOSIS — R531 Weakness: Secondary | ICD-10-CM | POA: Diagnosis not present

## 2019-09-14 DIAGNOSIS — J9601 Acute respiratory failure with hypoxia: Secondary | ICD-10-CM | POA: Diagnosis not present

## 2019-09-14 DIAGNOSIS — M6281 Muscle weakness (generalized): Secondary | ICD-10-CM | POA: Diagnosis not present

## 2019-09-14 DIAGNOSIS — J8410 Pulmonary fibrosis, unspecified: Secondary | ICD-10-CM | POA: Diagnosis not present

## 2019-09-14 DIAGNOSIS — I4891 Unspecified atrial fibrillation: Secondary | ICD-10-CM | POA: Diagnosis not present

## 2019-09-14 DIAGNOSIS — G309 Alzheimer's disease, unspecified: Secondary | ICD-10-CM | POA: Diagnosis not present

## 2019-09-14 DIAGNOSIS — L03211 Cellulitis of face: Secondary | ICD-10-CM | POA: Diagnosis not present

## 2019-09-14 DIAGNOSIS — I472 Ventricular tachycardia: Secondary | ICD-10-CM | POA: Diagnosis not present

## 2019-09-14 DIAGNOSIS — Z20828 Contact with and (suspected) exposure to other viral communicable diseases: Secondary | ICD-10-CM | POA: Diagnosis not present

## 2019-09-14 DIAGNOSIS — S0990XA Unspecified injury of head, initial encounter: Secondary | ICD-10-CM | POA: Diagnosis not present

## 2019-09-14 DIAGNOSIS — N179 Acute kidney failure, unspecified: Secondary | ICD-10-CM | POA: Diagnosis not present

## 2019-09-14 DIAGNOSIS — Z20822 Contact with and (suspected) exposure to covid-19: Secondary | ICD-10-CM | POA: Diagnosis not present

## 2019-09-14 DIAGNOSIS — D72829 Elevated white blood cell count, unspecified: Secondary | ICD-10-CM | POA: Diagnosis not present

## 2019-09-14 DIAGNOSIS — K219 Gastro-esophageal reflux disease without esophagitis: Secondary | ICD-10-CM | POA: Diagnosis not present

## 2019-09-14 DIAGNOSIS — E860 Dehydration: Secondary | ICD-10-CM | POA: Diagnosis not present

## 2019-09-14 DIAGNOSIS — J439 Emphysema, unspecified: Secondary | ICD-10-CM | POA: Diagnosis not present

## 2019-09-14 DIAGNOSIS — I4821 Permanent atrial fibrillation: Secondary | ICD-10-CM | POA: Diagnosis not present

## 2019-09-14 DIAGNOSIS — M1990 Unspecified osteoarthritis, unspecified site: Secondary | ICD-10-CM | POA: Diagnosis not present

## 2019-09-14 DIAGNOSIS — I2510 Atherosclerotic heart disease of native coronary artery without angina pectoris: Secondary | ICD-10-CM | POA: Diagnosis not present

## 2019-09-14 DIAGNOSIS — R2689 Other abnormalities of gait and mobility: Secondary | ICD-10-CM | POA: Diagnosis not present

## 2019-09-14 DIAGNOSIS — E872 Acidosis: Secondary | ICD-10-CM | POA: Diagnosis not present

## 2019-09-14 DIAGNOSIS — N390 Urinary tract infection, site not specified: Secondary | ICD-10-CM | POA: Diagnosis not present

## 2019-09-14 DIAGNOSIS — I462 Cardiac arrest due to underlying cardiac condition: Secondary | ICD-10-CM | POA: Diagnosis not present

## 2019-09-14 DIAGNOSIS — Z7401 Bed confinement status: Secondary | ICD-10-CM | POA: Diagnosis not present

## 2019-09-14 DIAGNOSIS — R410 Disorientation, unspecified: Secondary | ICD-10-CM | POA: Diagnosis not present

## 2019-09-14 DIAGNOSIS — Z7189 Other specified counseling: Secondary | ICD-10-CM | POA: Diagnosis not present

## 2019-09-14 DIAGNOSIS — I4901 Ventricular fibrillation: Secondary | ICD-10-CM | POA: Diagnosis not present

## 2019-09-14 DIAGNOSIS — E039 Hypothyroidism, unspecified: Secondary | ICD-10-CM | POA: Diagnosis not present

## 2019-09-14 DIAGNOSIS — E43 Unspecified severe protein-calorie malnutrition: Secondary | ICD-10-CM | POA: Diagnosis not present

## 2019-09-14 LAB — BASIC METABOLIC PANEL
Anion gap: 10 (ref 5–15)
BUN: 7 mg/dL — ABNORMAL LOW (ref 8–23)
CO2: 21 mmol/L — ABNORMAL LOW (ref 22–32)
Calcium: 8.6 mg/dL — ABNORMAL LOW (ref 8.9–10.3)
Chloride: 95 mmol/L — ABNORMAL LOW (ref 98–111)
Creatinine, Ser: 1.04 mg/dL (ref 0.61–1.24)
GFR calc Af Amer: >60 mL/min (ref >60)
GFR calc non Af Amer: >60 mL/min (ref >60)
Glucose, Bld: 86 mg/dL (ref 70–99)
Potassium: 3.9 mmol/L (ref 3.5–5.1)
Sodium: 126 mmol/L — ABNORMAL LOW (ref 135–145)

## 2019-09-14 MED ORDER — AMOXICILLIN-POT CLAVULANATE 875-125 MG PO TABS: 1.0000 | ORAL_TABLET | Freq: Two times a day (BID) | ORAL | 0 refills | Status: AC

## 2019-09-14 NOTE — Discharge Summary (Signed)
. Physician Discharge Summary  Cameron [name] [id number] DOB: [birth year] DOA: 09/06/2019  PCP: Lavone Orn, MD  Admit date: 09/06/2019 Discharge date: 09/14/2019  Admitted From: [facility] Disposition:  Discharged to [facility]  Recommendations for Outpatient Follow-up:  1. Follow up with PCP in 1-2 weeks 2. Please obtain BMP/CBC in one week  Discharge Condition: Stable  CODE STATUS: FULL   Brief/Interim Summary: Cameron A Turneris an 86 y.o[place]. He presents with medical history significant ofdementia,nonischemic cardiomyopathy, COPD, A. fib on Xarelto, GERD, arthritis, hypothyroidism presented to the ED via EMS for evaluation of left-sided upper tooth pain.Patient reports having slight discomfort in his tooth. He has been improving with IV antibiotics but his hospital stay has been complicated by hyponatremia.  09/14/19: Na+ is stable at 126. He is clear minded this AM. He has augmentin through 09/17/19. He is stable at this time. Would continue fluid restriction at this time and follow up labs w/ outpt doc/PCP. Needs to taper off SSRI as it is likely contributing.  [place] for discharge.   Discharge Diagnoses:  Principal Problem:   Facial cellulitis Active Problems:   Dementia ([place])   Atrial fibrillation ([facility])   Hyponatremia  Odontogenic left facial cellulitis:      -CT showing evidence of odontogenic left facial cellulitis and Carious left maxillary molar with associated maxillary alveolus cortical breakthrough and regional left face phlegmon with trace soft tissue gas.     -s/p Zosyn-changed to p.o. 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 Lexapro may be contributing to his issues. Need to taper and avoid SSRIs. He has been decreased to 5mg . Slow taper over next week or two under guidance of outpt PCP  Nonischemic cardiomyopathy:     - No significant elevation of BNP     - daily weights  Dementia:      - Continue home Aricept  COPD:      - Stable.      - No wheezing, cough, or dyspnea.     - PRN duonebs, denies complaints  A. Fib (Permanent)     - Stable.      - Continue Cardizem for rate control.      - Xarelto  Hypothyroidism      - Continue home synthroid  Discharge Instructions   Allergies as of 09/14/2019   No Known Allergies     Medication List    TAKE these medications   acetaminophen 325 MG tablet Commonly known as: TYLENOL Take 650 mg by mouth every 6 (six) hours as needed.   albuterol 108 (90 Base) MCG/ACT inhaler Commonly known as: VENTOLIN HFA Inhale 1-2 puffs into the lungs every 6 (six) hours as needed for wheezing or shortness of breath.   amoxicillin-clavulanate 875-125 MG tablet Commonly known as: AUGMENTIN Take 1 tablet by mouth every 12 (twelve) hours for 7 doses.   Anti-Diarrheal 2 MG capsule Generic drug: loperamide  Take 2 mg by mouth as needed for diarrhea or loose stools.   calcium-vitamin D 500-200 MG-UNIT tablet Commonly known as: OSCAL WITH D Take 1 tablet by mouth daily.   diltiazem 120 MG 24 hr capsule Commonly known as: CARDIZEM CD Take 120 mg by mouth daily.   donepezil 10 MG tablet Commonly known as: ARICEPT Take 1 tablet (10 mg total) by mouth at bedtime.   erythromycin ophthalmic ointment Place 1 application into the left eye 4 (four) times daily. Apply to skin vesicles on eyelid and forehead   escitalopram 10 MG tablet Commonly known as: LEXAPRO Take 5 mg by mouth daily.   fexofenadine 180 MG  tablet Commonly known as: ALLEGRA Take 180 mg by mouth daily as needed for allergies or rhinitis.   furosemide 20 MG tablet Commonly known as: LASIX Take 20 mg by mouth 3 (three) times a week. Take one tablet by mouth every Monday, Wednesday and Friday.   levothyroxine 75 MCG tablet Commonly known as: SYNTHROID ONE TABLET DAILY BEFORE BREAKFAST What changed: See the new instructions.   omeprazole 20 MG tablet Commonly known as: PRILOSEC OTC Take 20 mg by mouth daily.   PRESCRIPTION MEDICATION 10 mLs by Mouth Rinse route in the morning and at bedtime. CLOSYS Rinse 0.25% ; use after brushing teeth every morning, and evening. Do not swallow.   Rivaroxaban 15 MG Tabs tablet Commonly known as: Xarelto Take 1 tablet (15 mg total) by mouth daily with supper.   solifenacin 10 MG tablet Commonly known as: VESICARE Take 10 mg by mouth every morning.   tamsulosin 0.4 MG Caps capsule Commonly known as: FLOMAX Take 1 capsule daily by mouth.   vitamin B-12 500 MCG tablet Commonly known as: CYANOCOBALAMIN Take 500 mcg by mouth 2 (two) times a week. Mon and [facility] information for after-discharge care    Destination    [facility] .   Service: Skilled Nursing Contact information: [place] [phone]             No Known Allergies   Procedures/Studies: CT Head Wo Contrast  Result Date: 09/06/2019 CLINICAL DATA:  86 year old male with ataxia. Left facial swelling. EXAM: CT HEAD WITHOUT CONTRAST TECHNIQUE: Contiguous axial images were obtained from the base of the skull through the vertex without intravenous contrast. COMPARISON:  Brain MRI 04/07/2017. Head CT 03/22/2019. FINDINGS: Brain: Stable cerebral volume. No ventriculomegaly. No midline shift, mass effect, or evidence of intracranial mass lesion. No acute intracranial hemorrhage identified. Confluent bilateral white matter  hypodensity appears stable. But hypodensity in the bilateral basal ganglia appears mildly progressed since September. No superimposed acute cortically based infarct identified. Vascular: Calcified atherosclerosis at the skull base. No suspicious intracranial vascular hyperdensity. Skull: Carious dentition, cortical breakthrough at the anterior left maxillary molar. See additional details below. No acute fracture identified. Sinuses/Orbits: Visualized paranasal sinuses and mastoids are stable and well pneumatized. There is mild left maxillary alveolar recess mucosal thickening. Other: Partially visible asymmetric left anterior face, premalar soft tissue swelling and stranding (series 4, image 29). Furthermore, better demonstrated on coronal images there is confluent phlegmon like opacity with trace soft tissue gas lateral to a carious left upper maxillary tooth with evidence of cortical breakthrough (series 5, image 7). Orbit and scalp soft tissues appear negative. IMPRESSION: 1. Evidence of Odontogenic Left Facial Cellulitis. Carious left maxillary molar with associated maxillary alveolus cortical breakthrough and regional left face phlegmon with  trace soft tissue gas. 2. Evidence of cerebral small vessel disease which appears mildly progressed in the bilateral basal ganglia since September. 3. Otherwise no acute intracranial abnormality. Electronically Signed   By: Genevie Ann M.D.   On: 09/06/2019 22:44      Subjective: No acute events ON.   Discharge Exam: Vitals:   09/13/19 1935 09/14/19 0500  BP: 132/70 136/78  Pulse: 74 70  Resp: 16 16  Temp: 97.8 F (36.6 C) 97.8 F (36.6 C)  SpO2: 94% 100%   Vitals:   09/13/19 1355 09/13/19 1935 09/14/19 0500 09/14/19 0640  BP: (!) 123/51 132/70 136/78   Pulse: 64 74 70   Resp: 16 16 16    Temp: 98.4 F (36.9 C) 97.8 F (36.6 C) 97.8 F (36.6 C)   TempSrc: Oral Oral Oral   SpO2: 99% 94% 100%   Weight:    69.5 kg  Height:        General: 86 y.o.  male resting in bed in NAD Cardiovascular: RRR, +S1, S2, no m/g/r, equal pulses throughout Respiratory: CTABL, no w/r/r, normal WOB GI: BS+, NDNT, no masses noted, no organomegaly noted MSK: No e/c/c Neuro: A&O x 2, follows follows commands  Psyc: Appropriate interaction and affect, calm/cooperative     The results of significant diagnostics from this hospitalization (including imaging, microbiology, ancillary and laboratory) are listed below for reference.     Microbiology: Recent Results (from the past 240 hour(s))  SARS CORONAVIRUS 2 (TAT 6-24 HRS) Nasopharyngeal Nasopharyngeal Swab     Status: None   Collection Time: 09/13/19  9:46 AM   Specimen: Nasopharyngeal Swab  Result Value Ref Range Status   SARS Coronavirus 2 NEGATIVE NEGATIVE Final    Comment: (NOTE) SARS-CoV-2 target nucleic acids are NOT DETECTED. The SARS-CoV-2 RNA is generally detectable in upper and lower respiratory specimens during the acute phase of infection. Negative results do not preclude SARS-CoV-2 infection, do not rule out co-infections with other pathogens, and should not be used as the sole basis for treatment or other patient management decisions. Negative results must be combined with clinical observations, patient history, and epidemiological information. The expected result is Negative. Fact Sheet for Patients: SugarRoll.be Fact Sheet for Healthcare Providers: https://www.woods-mathews.com/ This test is not yet approved or cleared by the [place] FDA and  has been authorized for detection and/or diagnosis of SARS-CoV-2 by FDA under an Emergency Use Authorization (EUA). This EUA will remain  in effect (meaning this test can be used) for the duration of the COVID-19 declaration under Section 56 4(b)(1) of the Act, 21 U.S.C. section 360bbb-3(b)(1), unless the authorization is terminated or revoked sooner. Performed at [facility], [place]  [address] [place], [place] [address]      Labs: BNP (last 3 results) Recent Labs    09/06/19 2211  BNP [id number]*   Basic Metabolic Panel: Recent Labs  Lab 09/10/19 1806 09/11/19 0255 09/11/19 1716 09/12/19 0428 09/13/19 0227  NA 124* 128* 128* 128* 125*  K 4.1 3.7 4.1 4.1 4.1  CL 94* 96* 96* 96* 95*  CO2 20* 21* 23 22 21*  GLUCOSE 145* 89 103* 100* 104*  BUN 7* 8 7* 7* 9  CREATININE 1.12 1.05 1.10 0.98 0.96  CALCIUM 8.4* 8.2* 8.2* 8.5* 8.5*   Liver Function Tests: No results for input(s): AST, ALT, ALKPHOS, BILITOT, PROT, ALBUMIN in the last 168 hours. No results for input(s): LIPASE, AMYLASE in the last 168 hours. No results for input(s): AMMONIA in  the last 168 hours. CBC: Recent Labs  Lab 09/08/19 0408 09/09/19 0301  WBC 8.0 7.3  HGB 11.5* 12.0*  HCT 33.3* 34.0*  MCV 97.9 95.0  PLT 183 185   Cardiac Enzymes: No results for input(s): CKTOTAL, CKMB, CKMBINDEX, TROPONINI in the last 168 hours. BNP: Invalid input(s): POCBNP CBG: Recent Labs  Lab 09/07/19 1317  GLUCAP 107*   D-Dimer No results for input(s): DDIMER in the last 72 hours. Hgb A1c No results for input(s): HGBA1C in the last 72 hours. Lipid Profile No results for input(s): CHOL, HDL, LDLCALC, TRIG, CHOLHDL, LDLDIRECT in the last 72 hours. Thyroid function studies No results for input(s): TSH, T4TOTAL, T3FREE, THYROIDAB in the last 72 hours.  Invalid input(s): FREET3 Anemia work up No results for input(s): VITAMINB12, FOLATE, FERRITIN, TIBC, IRON, RETICCTPCT in the last 72 hours. Urinalysis    Component Value Date/Time   COLORURINE YELLOW 09/06/2019 2149   APPEARANCEUR HAZY (A) 09/06/2019 2149   LABSPEC 1.019 09/06/2019 2149   PHURINE 6.0 09/06/2019 2149   GLUCOSEU NEGATIVE 09/06/2019 2149   HGBUR NEGATIVE 09/06/2019 2149   BILIRUBINUR NEGATIVE 09/06/2019 2149   [place] NEGATIVE 09/06/2019 2149   PROTEINUR 30 (A) 09/06/2019 2149   UROBILINOGEN 0.2 07/12/2013 2230   NITRITE NEGATIVE  09/06/2019 2149   LEUKOCYTESUR NEGATIVE 09/06/2019 2149   Sepsis Labs Invalid input(s): PROCALCITONIN,  WBC,  LACTICIDVEN Microbiology Recent Results (from the past 240 hour(s))  SARS CORONAVIRUS 2 (TAT 6-24 HRS) Nasopharyngeal Nasopharyngeal Swab     Status: None   Collection Time: 09/13/19  9:46 AM   Specimen: Nasopharyngeal Swab  Result Value Ref Range Status   SARS Coronavirus 2 NEGATIVE NEGATIVE Final    Comment: (NOTE) SARS-CoV-2 target nucleic acids are NOT DETECTED. The SARS-CoV-2 RNA is generally detectable in upper and lower respiratory specimens during the acute phase of infection. Negative results do not preclude SARS-CoV-2 infection, do not rule out co-infections with other pathogens, and should not be used as the sole basis for treatment or other patient management decisions. Negative results must be combined with clinical observations, patient history, and epidemiological information. The expected result is Negative. Fact Sheet for Patients: SugarRoll.be Fact Sheet for Healthcare Providers: https://www.woods-mathews.com/ This test is not yet approved or cleared by the [place] FDA and  has been authorized for detection and/or diagnosis of SARS-CoV-2 by FDA under an Emergency Use Authorization (EUA). This EUA will remain  in effect (meaning this test can be used) for the duration of the COVID-19 declaration under Section 56 4(b)(1) of the Act, 21 U.S.C. section 360bbb-3(b)(1), unless the authorization is terminated or revoked sooner. Performed at [facility], [place] [address], [place], [place] [address]      Time coordinating discharge: 35 minutes  SIGNED:   Jonnie Finner, DO  [facility] 09/14/2019, 7:00 AM   If 7PM-7AM, please contact night-coverage [email or web address]

## 2019-09-14 NOTE — Progress Notes (Signed)
Report called to Nia, at [facility] facility. Awaiting PTAR for transport.

## 2019-09-14 NOTE — Progress Notes (Signed)
Physical Therapy Treatment Patient Details Name: Cameron [name] MRN: [id number] DOB: January 02, 1926 Today's Date: 09/14/2019    History of Present Illness 86 yo admitted with left upper tooth pain with odontogenic facial cellulitis. PMHx: dementia, CHF, Afib, NICM, COPD, GERD, arthritis    PT Comments    Pt pleasant and willing to mobilize with decreased activity tolerance this session and increased posterior right lean which has required progressively more assistance for balance over last 3 sessions. Pt alert and oriented but unable to recognize balance deficit or correct without assist. Educated for activity progression and HEP. Son present end of session and aware of activity.     Follow Up Recommendations  SNF;Supervision/Assistance - 24 hour     Equipment Recommendations  Rolling walker with 5" wheels    Recommendations for Other Services       Precautions / Restrictions Precautions Precautions: Fall Precaution Comments: posterior right bias Restrictions Weight Bearing Restrictions: No    Mobility  Bed Mobility Overal bed mobility: Needs Assistance Bed Mobility: Supine to Sit     Supine to sit: Min assist     General bed mobility comments: min assist to elevate trunk from surface with increased time and cues for initiation  Transfers Overall transfer level: Needs assistance Equipment used: Rolling walker (2 wheeled) Transfers: Sit to/from Stand Sit to Stand: Mod assist;Min assist         General transfer comment: mod assist for initial rise from bed with cues for hand placement and assist for anterior translation. 2nd trial from elevated bed min assist. mod assist to sit at chair after gait as pt sitting prematurely despite cues  Ambulation/Gait Ambulation/Gait assistance: Min assist Gait Distance (Feet): 65 Feet Assistive device: Rolling walker (2 wheeled) Gait Pattern/deviations: Step-through pattern;Decreased stride length;Trunk flexed;Shuffle   Gait  velocity interpretation: <1.8 ft/sec, indicate of risk for recurrent falls General Gait Details: pt with posterior right lean with physical assist for balance and pt with initiation of shuffling gait with mod cues for increased stride. pt then able to take a few larger steps then returns to shuffling gait. 4 standing pauses with decreased activity tolerance   Stairs             Wheelchair Mobility    Modified Rankin (Stroke Patients Only)       Balance Overall balance assessment: Needs assistance Sitting-balance support: Bilateral upper extremity supported;Feet supported Sitting balance-Leahy Scale: Fair Sitting balance - Comments: minguard for sitting EOB   Standing balance support: Bilateral upper extremity supported Standing balance-Leahy Scale: Poor Standing balance comment: physical assist and bil UE support for standing today                            Cognition Arousal/Alertness: Awake/alert Behavior During Therapy: Flat affect Overall Cognitive Status: Impaired/Different from baseline Area of Impairment: Safety/judgement;Orientation                 Orientation Level: Time   Memory: Decreased short-term memory   Safety/Judgement: Decreased awareness of deficits;Decreased awareness of safety            Exercises General Exercises - Lower Extremity Long Arc Quad: AROM;Both;Seated;15 reps Hip ABduction/ADduction: AROM;Both;10 reps;Seated Hip Flexion/Marching: AROM;Both;15 reps;Seated    General Comments        Pertinent Vitals/Pain Pain Assessment: No/denies pain    Home Living  Prior Function            PT Goals (current goals can now be found in the care plan section) Acute Rehab PT Goals Patient Stated Goal: return home Progress towards PT goals: Progressing toward goals    Frequency    Min 2X/week      PT Plan Current plan remains appropriate    Co-evaluation               AM-PAC PT "6 Clicks" Mobility   Outcome Measure  Help needed turning from your back to your side while in a flat bed without using bedrails?: A Little Help needed moving from lying on your back to sitting on the side of a flat bed without using bedrails?: A Little Help needed moving to and from a bed to a chair (including a wheelchair)?: A Lot Help needed standing up from a chair using your arms (e.g., wheelchair or bedside chair)?: A Lot Help needed to walk in hospital room?: A Little Help needed climbing 3-5 steps with a railing? : A Lot 6 Click Score: 15    End of Session Equipment Utilized During Treatment: Gait belt Activity Tolerance: Patient tolerated treatment well Patient left: in chair;with chair alarm set;with call bell/phone within reach;with family/visitor present Nurse Communication: Mobility status;Precautions PT Visit Diagnosis: Other abnormalities of gait and mobility (R26.89);Difficulty in walking, not elsewhere classified (R26.2)     Time: [id number] PT Time Calculation (min) (ACUTE ONLY): 31 min  Charges:  $Gait Training: 8-22 mins $Therapeutic Exercise: 8-22 mins                     Cameron [name] P, PT Acute Rehabilitation Services Pager: [phone] Office: [place] 09/14/2019, 12:17 PM

## 2019-09-14 NOTE — TOC Transition Note (Signed)
Transition of Care [facility]) - CM/SW Discharge Note   Patient Details  Name: Cameron [name] MRN: [id number] Date of Birth: 14-Mar-1926  [facility] CM/SW Contact:  Sharin Mons, RN Phone Number: 09/14/2019, 12:29 PM   Clinical Narrative:    Patient will DC [id number] SNF Anticipated DC date: 09/14/2019 Family notified: [name](son) Transport by: Corey Harold   Per MD patient ready for DC today [facility] . RN, patient, patient's family, and facility notified of DC. Discharge Summary and FL2 sent to facility. RN to call report prior to discharge [phone]). DC packet on chart. Ambulance transport requested for patient.   RNCM will sign off for now as intervention is no longer needed. Please consult [place] again if new needs arise.   Final next level of care: Skilled Nursing Facility Barriers to Discharge: No Barriers Identified   Patient Goals and CMS Choice   CMS Medicare.gov Compare Post Acute Care list provided to:: Patient Represenative (must comment)([name] ( son))    Discharge Placement                       Discharge Plan and Services                                     Social Determinants of Health (SDOH) Interventions     Readmission Risk Interventions No flowsheet data found.

## 2019-09-14 NOTE — Progress Notes (Addendum)
Occupational Therapy Treatment Patient Details Name: Cameron [name] MRN: [id number] DOB: 1925/12/04 Today's Date: 09/14/2019    History of present illness 86 yo admitted with left upper tooth pain with odontogenic facial cellulitis. PMHx: dementia, CHF, Afib, NICM, COPD, GERD, arthritis   OT comments  Pt making progress with functional goals. Pt in recliner upon arrival. Session focused on selfcare seated in recliner, grooming/hygiene standing, sit - stand transitions with RW for [facility] transfers and overall activity tolerance. OT will continue acutely  Follow Up Recommendations  SNF    Equipment Recommendations  None recommended by OT    Recommendations for Other Services      Precautions / Restrictions Precautions Precautions: Fall Restrictions Weight Bearing Restrictions: No       Mobility Bed Mobility Overal bed mobility: Needs Assistance Bed Mobility: Supine to Sit     Supine to sit: Min assist     General bed mobility comments: min assist to elevate trunk from surface with increased time and cues for initiation  Transfers Overall transfer level: Needs assistance Equipment used: Rolling walker (2 wheeled) Transfers: Sit to/from Stand Sit to Stand: Min assist              Balance Overall balance assessment: Needs assistance Sitting-balance support: Bilateral upper extremity supported;Feet supported Sitting balance-Leahy Scale: Fair     Standing balance support: Bilateral upper extremity supported Standing balance-Leahy Scale: Poor                             ADL either performed or assessed with clinical judgement   ADL Overall ADL's : Needs assistance/impaired Eating/Feeding: Set up;Independent;Sitting   Grooming: Minimal assistance;Standing Grooming Details (indicate cue type and reason): fatigues quickly, requires external support Upper Body Bathing: Min guard;Sitting Upper Body Bathing Details (indicate cue type and reason):  simulated Lower Body Bathing: Sitting/lateral leans;Minimal assistance   Upper Body Dressing : Min guard;Sitting       Toilet Transfer: Minimal assistance;Cueing for safety;Ambulation;RW   Toileting- Clothing Manipulation and Hygiene: Minimal assistance       Functional mobility during ADLs: Minimal assistance;Rolling walker;Cueing for safety General ADL Comments: decreased balance, decreased safety awareness,     Vision Baseline Vision/History: Wears glasses Wears Glasses: At all times Patient Visual Report: No change from baseline     Perception     Praxis      Cognition Arousal/Alertness: Awake/alert Behavior During Therapy: Flat affect Overall Cognitive Status: No family/caregiver present to determine baseline cognitive functioning Area of Impairment: Memory;Safety/judgement                 Orientation Level: Time;Place   Memory: Decreased short-term memory   Safety/Judgement: Decreased awareness of deficits;Decreased awareness of safety              Exercises     Shoulder Instructions       General Comments      Pertinent Vitals/ Pain       Pain Assessment: No/denies pain  Home Living                                          Prior Functioning/Environment              Frequency  Min 2X/week        Progress Toward Goals  OT Goals(current goals can now be found  in the care plan section)  Progress towards OT goals: Progressing toward goals  Acute Rehab OT Goals Patient Stated Goal: return home  Plan Discharge plan needs to be updated    Co-evaluation                 AM-PAC OT "6 Clicks" Daily Activity     Outcome Measure   Help from another person eating meals?: None Help from another person taking care of personal grooming?: A Little Help from another person toileting, which includes using toliet, bedpan, or urinal?: A Little Help from another person bathing (including washing, rinsing,  drying)?: A Little Help from another person to put on and taking off regular upper body clothing?: A Little Help from another person to put on and taking off regular lower body clothing?: A Lot 6 Click Score: 18    End of Session Equipment Utilized During Treatment: Gait belt;Rolling walker  OT Visit Diagnosis: Unsteadiness on feet (R26.81);Other abnormalities of gait and mobility (R26.89);Muscle weakness (generalized) (M62.81);Other symptoms and signs involving cognitive function   Activity Tolerance Patient tolerated treatment well   Patient Left in chair;with call bell/phone within reach;with chair alarm set   Nurse Communication          Time: [phone] OT Time Calculation (min): 24 min  Charges: OT General Charges $OT Visit: 1 Visit OT Treatments $Self Care/Home Management : 8-22 mins $Therapeutic Activity: 8-22 mins     Britt Bottom 09/14/2019, 10:29 AM

## 2019-09-15 DIAGNOSIS — E871 Hypo-osmolality and hyponatremia: Secondary | ICD-10-CM | POA: Diagnosis not present

## 2019-09-15 DIAGNOSIS — I4891 Unspecified atrial fibrillation: Secondary | ICD-10-CM | POA: Diagnosis not present

## 2019-09-15 DIAGNOSIS — G309 Alzheimer's disease, unspecified: Secondary | ICD-10-CM | POA: Diagnosis not present

## 2019-09-15 DIAGNOSIS — L0390 Cellulitis, unspecified: Secondary | ICD-10-CM | POA: Diagnosis not present

## 2019-09-23 ENCOUNTER — Emergency Department (HOSPITAL_COMMUNITY): Payer: PPO

## 2019-09-23 ENCOUNTER — Inpatient Hospital Stay (HOSPITAL_COMMUNITY)
Admission: EM | Admit: 2019-09-23 | Discharge: 2019-10-28 | DRG: 981 | Disposition: E | Payer: PPO | Source: Skilled Nursing Facility | Attending: Internal Medicine | Admitting: Internal Medicine

## 2019-09-23 ENCOUNTER — Encounter (HOSPITAL_COMMUNITY): Payer: Self-pay

## 2019-09-23 DIAGNOSIS — I4891 Unspecified atrial fibrillation: Secondary | ICD-10-CM | POA: Diagnosis not present

## 2019-09-23 DIAGNOSIS — I340 Nonrheumatic mitral (valve) insufficiency: Secondary | ICD-10-CM | POA: Diagnosis not present

## 2019-09-23 DIAGNOSIS — N390 Urinary tract infection, site not specified: Secondary | ICD-10-CM | POA: Diagnosis present

## 2019-09-23 DIAGNOSIS — J690 Pneumonitis due to inhalation of food and vomit: Secondary | ICD-10-CM | POA: Diagnosis not present

## 2019-09-23 DIAGNOSIS — K219 Gastro-esophageal reflux disease without esophagitis: Secondary | ICD-10-CM | POA: Diagnosis present

## 2019-09-23 DIAGNOSIS — I2510 Atherosclerotic heart disease of native coronary artery without angina pectoris: Secondary | ICD-10-CM | POA: Diagnosis present

## 2019-09-23 DIAGNOSIS — E871 Hypo-osmolality and hyponatremia: Secondary | ICD-10-CM | POA: Diagnosis present

## 2019-09-23 DIAGNOSIS — G9340 Encephalopathy, unspecified: Secondary | ICD-10-CM | POA: Diagnosis not present

## 2019-09-23 DIAGNOSIS — N179 Acute kidney failure, unspecified: Secondary | ICD-10-CM | POA: Diagnosis not present

## 2019-09-23 DIAGNOSIS — M1990 Unspecified osteoarthritis, unspecified site: Secondary | ICD-10-CM | POA: Diagnosis present

## 2019-09-23 DIAGNOSIS — I63232 Cerebral infarction due to unspecified occlusion or stenosis of left carotid arteries: Secondary | ICD-10-CM | POA: Diagnosis not present

## 2019-09-23 DIAGNOSIS — I428 Other cardiomyopathies: Secondary | ICD-10-CM | POA: Diagnosis present

## 2019-09-23 DIAGNOSIS — R0682 Tachypnea, not elsewhere classified: Secondary | ICD-10-CM

## 2019-09-23 DIAGNOSIS — Z66 Do not resuscitate: Secondary | ICD-10-CM

## 2019-09-23 DIAGNOSIS — Z7189 Other specified counseling: Secondary | ICD-10-CM | POA: Diagnosis not present

## 2019-09-23 DIAGNOSIS — J439 Emphysema, unspecified: Secondary | ICD-10-CM | POA: Diagnosis present

## 2019-09-23 DIAGNOSIS — I4510 Unspecified right bundle-branch block: Secondary | ICD-10-CM | POA: Diagnosis present

## 2019-09-23 DIAGNOSIS — J9601 Acute respiratory failure with hypoxia: Secondary | ICD-10-CM | POA: Diagnosis not present

## 2019-09-23 DIAGNOSIS — D638 Anemia in other chronic diseases classified elsewhere: Secondary | ICD-10-CM | POA: Diagnosis present

## 2019-09-23 DIAGNOSIS — Z7901 Long term (current) use of anticoagulants: Secondary | ICD-10-CM

## 2019-09-23 DIAGNOSIS — I469 Cardiac arrest, cause unspecified: Secondary | ICD-10-CM

## 2019-09-23 DIAGNOSIS — Z20822 Contact with and (suspected) exposure to covid-19: Secondary | ICD-10-CM | POA: Diagnosis present

## 2019-09-23 DIAGNOSIS — I5032 Chronic diastolic (congestive) heart failure: Secondary | ICD-10-CM | POA: Diagnosis present

## 2019-09-23 DIAGNOSIS — N39498 Other specified urinary incontinence: Secondary | ICD-10-CM | POA: Diagnosis present

## 2019-09-23 DIAGNOSIS — J8410 Pulmonary fibrosis, unspecified: Secondary | ICD-10-CM | POA: Diagnosis present

## 2019-09-23 DIAGNOSIS — Z955 Presence of coronary angioplasty implant and graft: Secondary | ICD-10-CM

## 2019-09-23 DIAGNOSIS — I472 Ventricular tachycardia: Secondary | ICD-10-CM | POA: Diagnosis not present

## 2019-09-23 DIAGNOSIS — Z0189 Encounter for other specified special examinations: Secondary | ICD-10-CM

## 2019-09-23 DIAGNOSIS — I4821 Permanent atrial fibrillation: Secondary | ICD-10-CM | POA: Diagnosis present

## 2019-09-23 DIAGNOSIS — D72829 Elevated white blood cell count, unspecified: Secondary | ICD-10-CM | POA: Diagnosis present

## 2019-09-23 DIAGNOSIS — R627 Adult failure to thrive: Secondary | ICD-10-CM

## 2019-09-23 DIAGNOSIS — I255 Ischemic cardiomyopathy: Secondary | ICD-10-CM | POA: Diagnosis present

## 2019-09-23 DIAGNOSIS — F0390 Unspecified dementia without behavioral disturbance: Secondary | ICD-10-CM | POA: Diagnosis present

## 2019-09-23 DIAGNOSIS — Z79899 Other long term (current) drug therapy: Secondary | ICD-10-CM

## 2019-09-23 DIAGNOSIS — I462 Cardiac arrest due to underlying cardiac condition: Secondary | ICD-10-CM | POA: Diagnosis not present

## 2019-09-23 DIAGNOSIS — E43 Unspecified severe protein-calorie malnutrition: Secondary | ICD-10-CM | POA: Diagnosis present

## 2019-09-23 DIAGNOSIS — F329 Major depressive disorder, single episode, unspecified: Secondary | ICD-10-CM | POA: Diagnosis present

## 2019-09-23 DIAGNOSIS — G9341 Metabolic encephalopathy: Secondary | ICD-10-CM | POA: Diagnosis present

## 2019-09-23 DIAGNOSIS — K449 Diaphragmatic hernia without obstruction or gangrene: Secondary | ICD-10-CM | POA: Diagnosis present

## 2019-09-23 DIAGNOSIS — I491 Atrial premature depolarization: Secondary | ICD-10-CM | POA: Diagnosis not present

## 2019-09-23 DIAGNOSIS — I350 Nonrheumatic aortic (valve) stenosis: Secondary | ICD-10-CM | POA: Diagnosis not present

## 2019-09-23 DIAGNOSIS — Z4682 Encounter for fitting and adjustment of non-vascular catheter: Secondary | ICD-10-CM | POA: Diagnosis not present

## 2019-09-23 DIAGNOSIS — Z515 Encounter for palliative care: Secondary | ICD-10-CM | POA: Diagnosis not present

## 2019-09-23 DIAGNOSIS — J189 Pneumonia, unspecified organism: Secondary | ICD-10-CM

## 2019-09-23 DIAGNOSIS — Z8249 Family history of ischemic heart disease and other diseases of the circulatory system: Secondary | ICD-10-CM

## 2019-09-23 DIAGNOSIS — J432 Centrilobular emphysema: Secondary | ICD-10-CM | POA: Diagnosis not present

## 2019-09-23 DIAGNOSIS — J984 Other disorders of lung: Secondary | ICD-10-CM | POA: Diagnosis not present

## 2019-09-23 DIAGNOSIS — I4901 Ventricular fibrillation: Secondary | ICD-10-CM | POA: Diagnosis not present

## 2019-09-23 DIAGNOSIS — I639 Cerebral infarction, unspecified: Secondary | ICD-10-CM | POA: Diagnosis not present

## 2019-09-23 DIAGNOSIS — I6340 Cerebral infarction due to embolism of unspecified cerebral artery: Secondary | ICD-10-CM | POA: Diagnosis not present

## 2019-09-23 DIAGNOSIS — K5900 Constipation, unspecified: Secondary | ICD-10-CM

## 2019-09-23 DIAGNOSIS — B9620 Unspecified Escherichia coli [E. coli] as the cause of diseases classified elsewhere: Secondary | ICD-10-CM | POA: Diagnosis present

## 2019-09-23 DIAGNOSIS — I4819 Other persistent atrial fibrillation: Secondary | ICD-10-CM | POA: Diagnosis not present

## 2019-09-23 DIAGNOSIS — I110 Hypertensive heart disease with heart failure: Secondary | ICD-10-CM | POA: Diagnosis present

## 2019-09-23 DIAGNOSIS — K8020 Calculus of gallbladder without cholecystitis without obstruction: Secondary | ICD-10-CM | POA: Diagnosis not present

## 2019-09-23 DIAGNOSIS — Z79890 Hormone replacement therapy (postmenopausal): Secondary | ICD-10-CM

## 2019-09-23 DIAGNOSIS — Z681 Body mass index (BMI) 19 or less, adult: Secondary | ICD-10-CM

## 2019-09-23 DIAGNOSIS — I63411 Cerebral infarction due to embolism of right middle cerebral artery: Secondary | ICD-10-CM | POA: Diagnosis not present

## 2019-09-23 DIAGNOSIS — E039 Hypothyroidism, unspecified: Secondary | ICD-10-CM | POA: Diagnosis present

## 2019-09-23 DIAGNOSIS — E860 Dehydration: Secondary | ICD-10-CM | POA: Diagnosis not present

## 2019-09-23 DIAGNOSIS — R4182 Altered mental status, unspecified: Secondary | ICD-10-CM | POA: Diagnosis not present

## 2019-09-23 DIAGNOSIS — R7401 Elevation of levels of liver transaminase levels: Secondary | ICD-10-CM | POA: Diagnosis present

## 2019-09-23 DIAGNOSIS — I4581 Long QT syndrome: Secondary | ICD-10-CM | POA: Diagnosis not present

## 2019-09-23 DIAGNOSIS — Z9181 History of falling: Secondary | ICD-10-CM

## 2019-09-23 DIAGNOSIS — E872 Acidosis: Secondary | ICD-10-CM | POA: Diagnosis not present

## 2019-09-23 DIAGNOSIS — Y95 Nosocomial condition: Secondary | ICD-10-CM | POA: Diagnosis not present

## 2019-09-23 DIAGNOSIS — E785 Hyperlipidemia, unspecified: Secondary | ICD-10-CM | POA: Diagnosis present

## 2019-09-23 DIAGNOSIS — I214 Non-ST elevation (NSTEMI) myocardial infarction: Secondary | ICD-10-CM | POA: Diagnosis not present

## 2019-09-23 DIAGNOSIS — Z823 Family history of stroke: Secondary | ICD-10-CM

## 2019-09-23 DIAGNOSIS — N401 Enlarged prostate with lower urinary tract symptoms: Secondary | ICD-10-CM | POA: Diagnosis present

## 2019-09-23 DIAGNOSIS — J811 Chronic pulmonary edema: Secondary | ICD-10-CM | POA: Diagnosis not present

## 2019-09-23 DIAGNOSIS — S0990XA Unspecified injury of head, initial encounter: Secondary | ICD-10-CM | POA: Diagnosis not present

## 2019-09-23 DIAGNOSIS — R531 Weakness: Secondary | ICD-10-CM | POA: Diagnosis not present

## 2019-09-23 DIAGNOSIS — Z87891 Personal history of nicotine dependence: Secondary | ICD-10-CM

## 2019-09-23 DIAGNOSIS — R296 Repeated falls: Secondary | ICD-10-CM | POA: Diagnosis present

## 2019-09-23 DIAGNOSIS — R1312 Dysphagia, oropharyngeal phase: Secondary | ICD-10-CM | POA: Diagnosis not present

## 2019-09-23 HISTORY — DX: Pulmonary fibrosis, unspecified: J84.10

## 2019-09-23 LAB — CBC WITH DIFFERENTIAL/PLATELET
Abs Immature Granulocytes: 0.08 10*3/uL — ABNORMAL HIGH (ref 0.00–0.07)
Basophils Absolute: 0 10*3/uL (ref 0.0–0.1)
Basophils Relative: 0 %
Eosinophils Absolute: 0 10*3/uL (ref 0.0–0.5)
Eosinophils Relative: 0 %
HCT: 37.3 % — ABNORMAL LOW (ref 39.0–52.0)
Hemoglobin: 13 g/dL (ref 13.0–17.0)
Immature Granulocytes: 1 %
Lymphocytes Relative: 8 %
Lymphs Abs: 1.3 10*3/uL (ref 0.7–4.0)
MCH: 34.2 pg — ABNORMAL HIGH (ref 26.0–34.0)
MCHC: 34.9 g/dL (ref 30.0–36.0)
MCV: 98.2 fL (ref 80.0–100.0)
Monocytes Absolute: 1.6 10*3/uL — ABNORMAL HIGH (ref 0.1–1.0)
Monocytes Relative: 10 %
Neutro Abs: 13.9 10*3/uL — ABNORMAL HIGH (ref 1.7–7.7)
Neutrophils Relative %: 81 %
Platelets: 222 10*3/uL (ref 150–400)
RBC: 3.8 MIL/uL — ABNORMAL LOW (ref 4.22–5.81)
RDW: 13.1 % (ref 11.5–15.5)
WBC: 17 10*3/uL — ABNORMAL HIGH (ref 4.0–10.5)
nRBC: 0 % (ref 0.0–0.2)

## 2019-09-23 LAB — PROTIME-INR
INR: 3.1 — ABNORMAL HIGH (ref 0.8–1.2)
Prothrombin Time: 31.9 seconds — ABNORMAL HIGH (ref 11.4–15.2)

## 2019-09-23 MED ORDER — SODIUM CHLORIDE 0.9 % IV BOLUS
1000.0000 mL | Freq: Once | INTRAVENOUS | Status: AC
  Administered 2019-09-23: 1000 mL via INTRAVENOUS

## 2019-09-23 NOTE — ED Provider Notes (Signed)
[place] EMERGENCY DEPARTMENT Provider Note   CSN: [id number] Arrival date & time: 09/21/2019  2247     History Chief Complaint  Patient presents with  . Fall    Cameron [name] is a 86 y.o. male89.  86 yo M with a chief complaint of failure to thrive.  The patient was recently in the hospital for a facial cellulitis requiring IV antibiotics.  Since then he was placed in a skilled nursing facility and is under quarantine during the pandemic.  Family has not been able to fully visit with him.  Unsure if he is actually been eating or drinking.  Patient has had a slow decline in his physical status.  Has been getting physical therapy at the facility.  He had a fall while he was in the hospital about a week ago.  Also a fall at the facility.  No obvious injury per the family.  Patient denies any complaints.  Specifically denies headache neck pain chest pain shortness of breath cough fever abdominal pain nausea vomiting diarrhea dark stool or blood in his stool.  He thinks that he has been eating and drinking fine.  The history is provided by the patient.  Fall This is a new problem. The current episode started 2 days ago. The problem occurs constantly. The problem has not changed since onset.Pertinent negatives include no chest pain, no abdominal pain, no headaches and no shortness of breath. Nothing aggravates the symptoms. Nothing relieves the symptoms. He has tried nothing for the symptoms. The treatment provided no relief.       Past Medical History:  Diagnosis Date  . Arthritis   . CHF (congestive heart failure) ([place])   . COPD (chronic obstructive pulmonary disease) ([place])   . Dysrhythmia    atrial fibrilation  . GERD (gastroesophageal reflux disease)   . Pulmonary fibrosis [facility])     Patient Active Problem List   Diagnosis Date Noted  . Acute metabolic encephalopathy [id number]  . Leukocytosis 09/24/2019  . Hypothyroidism 09/24/2019  . Anemia 09/07/2019  .  Hyponatremia 09/07/2019  . Anticoagulated 05/06/2017  . Spells of decreased attentiveness 04/01/2017  . Community acquired pneumonia   . Physical deconditioning   . Influenza-like illness 05/27/2015  . Atrial fibrillation ([place]) 08/23/2013  . CAP (community acquired pneumonia) 07/17/2013  . Dementia ([place]) 07/12/2013  . Weakness generalized 07/12/2013  . Diarrhea 07/12/2013  . Protein-calorie malnutrition, severe ([place]) 03/30/2013  . PNA (pneumonia) 03/28/2013  . Fever 03/27/2013  . Hypotension 03/27/2013  . Sepsis ([place]) 03/26/2013  . FUO (fever of unknown origin) 03/26/2013  . NICM (nonischemic cardiomyopathy) EF 30% 03/26/2013  . Chronic diastolic HF (heart failure) ([place]) 03/26/2013  . Persistent atrial fibrillation ([place]) 10/04/2012  . Acute systolic heart failure ([place]) 10/04/2012  . ALLERGIC RHINITIS 03/15/2007  . BRONCHIECTASIS, W/O ACUTE EXACERBATION 03/15/2007  . PULMONARY FIBROSIS, POSTINFLAMMATORY 03/15/2007    Past Surgical History:  Procedure Laterality Date  . hemmroidectmy    . HERNIA REPAIR    . LEFT HEART CATHETERIZATION WITH CORONARY ANGIOGRAM N/A 10/07/2012   Procedure: LEFT HEART CATHETERIZATION WITH CORONARY ANGIOGRAM, possible PCI;  Surgeon: Jettie Booze, MD;  Location: [facility] CATH LAB;  Service: Cardiovascular;  Laterality: N/A;  . TONSILLECTOMY         Family History  Problem Relation Age of Onset  . Heart attack Mother   . Pulmonary embolism Father     Social History   Tobacco Use  . Smoking status: Former Smoker  Packs/day: 2.00    Years: 40.00    Pack years: 80.00    Types: Cigarettes    Quit date: 06/29/1961    Years since quitting: 58.2  . Smokeless tobacco: Never Used  Substance Use Topics  . Alcohol use: Yes    Alcohol/week: 2.0 standard drinks    Types: 2 Glasses of wine per week    Comment: occasional  . Drug use: No    Home Medications Prior to Admission medications   Medication Sig Start Date End Date Taking? Authorizing  Provider  acetaminophen (TYLENOL) 325 MG tablet Take 650 mg by mouth every 6 (six) hours as needed.   Yes [provider]  albuterol (PROVENTIL HFA;VENTOLIN HFA) 108 (90 BASE) MCG/ACT inhaler Inhale 1-2 puffs into the lungs every 6 (six) hours as needed for wheezing or shortness of breath.   Yes [provider]  calcium-vitamin D (OSCAL WITH D) 500-200 MG-UNIT tablet Take 1 tablet by mouth daily.    Yes [provider]  diltiazem (CARDIZEM CD) 120 MG 24 hr capsule Take 120 mg by mouth daily.   Yes [provider]  donepezil (ARICEPT) 10 MG tablet Take 1 tablet (10 mg total) by mouth at bedtime. 10/09/12  Yes Norins, Heinz Knuckles, MD  erythromycin ophthalmic ointment Place 1 application into the left eye 4 (four) times daily. Apply to skin vesicles on eyelid and forehead 03/24/19  Yes Bernarda Caffey, MD  escitalopram (LEXAPRO) 10 MG tablet Take 5 mg by mouth daily.   Yes [provider]  fexofenadine (ALLEGRA) 180 MG tablet Take 180 mg by mouth daily as needed for allergies or rhinitis.   Yes [provider]  furosemide (LASIX) 20 MG tablet Take 20 mg by mouth 3 (three) times a week. Take one tablet by mouth every Monday, Wednesday and Friday.   Yes [provider]  levothyroxine (SYNTHROID, LEVOTHROID) 75 MCG tablet ONE TABLET DAILY BEFORE BREAKFAST Patient taking differently: Take 75 mcg by mouth daily before breakfast.    Yes Norins, Heinz Knuckles, MD  loperamide (ANTI-DIARRHEAL) 2 MG capsule Take 2 mg by mouth as needed for diarrhea or loose stools.   Yes [provider]  omeprazole (PRILOSEC OTC) 20 MG tablet Take 20 mg by mouth daily.   Yes [provider]  Rivaroxaban (XARELTO) 15 MG TABS tablet Take 1 tablet (15 mg total) by mouth daily with supper. 08/23/13  Yes Jettie Booze, MD  solifenacin (VESICARE) 10 MG tablet Take 10 mg by mouth every morning.    Yes [provider]  tamsulosin (FLOMAX) 0.4 MG CAPS  capsule Take 1 capsule daily by mouth. 04/15/17  Yes [provider]  vitamin B-12 (CYANOCOBALAMIN) 500 MCG tablet Take 500 mcg by mouth 2 (two) times a week. Mon and Thurs   Yes [provider]  PRESCRIPTION MEDICATION 10 mLs by Mouth Rinse route in the morning and at bedtime. CLOSYS Rinse 0.25% ; use after brushing teeth every morning, and evening. Do not swallow.    [provider]    Allergies    Patient has no known allergies.  Review of Systems   Review of Systems  Constitutional: Negative for chills and fever.  HENT: Negative for congestion and facial swelling.   Eyes: Negative for discharge and visual disturbance.  Respiratory: Negative for shortness of breath.   Cardiovascular: Negative for chest pain and palpitations.  Gastrointestinal: Negative for abdominal pain, diarrhea and vomiting.  Musculoskeletal: Negative for arthralgias and myalgias.  Skin:  Negative for color change and rash.  Neurological: Negative for tremors, syncope and headaches.  Psychiatric/Behavioral: Negative for confusion and dysphoric mood.    Physical Exam Updated Vital Signs BP 110/67 (BP Location: Right Arm)   Pulse 86   Temp 98.8 F (37.1 C) (Oral)   Resp (!) 29   SpO2 98%   Physical Exam Vitals and nursing note reviewed.  Constitutional:      Appearance: He is well-developed.     Comments: Frail-appearing  HENT:     Head: Normocephalic and atraumatic.     Comments: No obvious intraoral edema or erythema.  No obvious pain with palpation to the left upper teeth.    Mouth/Throat:     Mouth: Mucous membranes are dry.  Eyes:     Pupils: Pupils are equal, round, and reactive to light.  Neck:     Vascular: No JVD.  Cardiovascular:     Rate and Rhythm: Normal rate and regular rhythm.     Heart sounds: No murmur. No friction rub. No gallop.   Pulmonary:     Effort: No respiratory distress.     Breath sounds: No wheezing.  Abdominal:     General: There is no  distension.     Tenderness: There is no abdominal tenderness. There is no guarding or rebound.  Musculoskeletal:        General: Normal range of motion.     Cervical back: Normal range of motion and neck supple.  Skin:    Coloration: Skin is not pale.     Findings: No rash.     Comments: Significant skin tenting  Neurological:     Mental Status: He is alert and oriented to person, place, and time.     Cranial Nerves: Cranial nerves are intact.     Sensory: Sensation is intact.     Motor: Motor function is intact.     Comments: Patient is largely able to sit up under his own ability.  No focal weakness.  Psychiatric:        Behavior: Behavior normal.     ED Results / Procedures / Treatments   Labs (all labs ordered are listed, but only abnormal results are displayed) Labs Reviewed  COMPREHENSIVE METABOLIC PANEL - Abnormal; Notable for the following components:      Result Value   Sodium 131 (*)    CO2 20 (*)    Glucose, Bld 141 (*)    Calcium 8.8 (*)    Albumin 2.7 (*)    AST 175 (*)    GFR calc non Af Amer 52 (*)    All other components within normal limits  CBC WITH DIFFERENTIAL/PLATELET - Abnormal; Notable for the following components:   WBC 17.0 (*)    RBC 3.80 (*)    HCT 37.3 (*)    MCH 34.2 (*)    Neutro Abs 13.9 (*)    Monocytes Absolute 1.6 (*)    Abs Immature Granulocytes 0.08 (*)    All other components within normal limits  PROTIME-INR - Abnormal; Notable for the following components:   Prothrombin Time 31.9 (*)    INR 3.1 (*)    All other components within normal limits  URINALYSIS, COMPLETE (UACMP) WITH MICROSCOPIC - Abnormal; Notable for the following components:   APPearance TURBID (*)    Hgb urine dipstick LARGE (*)    Protein, ur 100 (*)    Nitrite POSITIVE (*)    Leukocytes,Ua MODERATE (*)    RBC / HPF >  50 (*)    WBC, UA >50 (*)    Bacteria, UA MANY (*)    Non Squamous Epithelial 0-5 (*)    All other components within normal limits  URINE  CULTURE  CULTURE, BLOOD (ROUTINE X 2)  CULTURE, BLOOD (ROUTINE X 2)  SARS CORONAVIRUS 2 (TAT 6-24 HRS)  SODIUM, URINE, RANDOM  OSMOLALITY, URINE  LACTIC ACID, PLASMA  LACTIC ACID, PLASMA  OSMOLALITY  TSH  C-REACTIVE PROTEIN  TYPE AND SCREEN  ABO/RH    EKG EKG Interpretation  Date/Time:  Saturday September 23 2019 22:55:30 EDT Ventricular Rate:  85 PR Interval:    QRS Duration: 116 QT Interval:  406 QTC Calculation: 483 R Axis:   -63 Text Interpretation: Atrial fibrillation Incomplete right bundle branch block Inferior infarct, age indeterminate Anteroseptal infarct, age indeterminate No acute changes Nonspecific ST abnormality Confirmed by Varney Biles [phone]) on 09/22/2019 10:57:49 PM   Radiology CT Head Wo Contrast  Result Date: 09/24/2019 CLINICAL DATA:  Head trauma EXAM: CT HEAD WITHOUT CONTRAST TECHNIQUE: Contiguous axial images were obtained from the base of the skull through the vertex without intravenous contrast. COMPARISON:  September 06, 2019 FINDINGS: Brain: No evidence of acute territorial infarction, hemorrhage, hydrocephalus,extra-axial collection or mass lesion/mass effect. There is dilatation the ventricles and sulci consistent with age-related atrophy. Low-attenuation changes in the deep white matter consistent with small vessel ischemia. Vascular: No hyperdense vessel or unexpected calcification. Skull: The skull is intact. No fracture or focal lesion identified. Sinuses/Orbits: The visualized paranasal sinuses and mastoid air cells are clear. The orbits and globes intact. Other: None IMPRESSION: No acute intracranial abnormality. Findings consistent with age related atrophy and chronic small vessel ischemia Electronically Signed   By: Prudencio Pair M.D.   On: 09/24/2019 00:04   DG Chest Port 1 View  Result Date: 09/24/2019 CLINICAL DATA:  Change in mental status EXAM: PORTABLE CHEST 1 VIEW COMPARISON:  May 28, 2015 FINDINGS: The heart size and mediastinal contours  are unchanged with mild cardiomegaly. Again noted are coarsened interstitial markings throughout both lungs with biapical scarring. Aortic knob calcifications. No acute osseous abnormality. IMPRESSION: Cardiomegaly Slight interval worsening in the coarsened interstitial markings seen throughout both lungs which may be due to worsening in chronic lung disease, however can not exclude concomitant infectious etiology. Electronically Signed   By: Prudencio Pair M.D.   On: 09/24/2019 00:52    Procedures Procedures (including critical care time)  Medications Ordered in ED Medications  0.9 %  sodium chloride infusion (has no administration in time range)  sodium chloride 0.9 % bolus 1,000 mL (0 mLs Intravenous Stopped 09/24/19 0050)  iohexol (OMNIPAQUE) 300 MG/ML solution 75 mL (75 mLs Intravenous Contrast Given 09/24/19 0233)    ED Course  I have reviewed the triage vital signs and the nursing notes.  Pertinent labs & imaging results that were available during my care of the patient were reviewed by me and considered in my medical decision making (see chart for details).    MDM Rules/Calculators/A&P                      86 yo M with a chief complaint of failure to thrive.  Patient looks like he is significantly dehydrated on exam.  We will give a bolus of IV fluids.  Check lab work.  Screen for infection.  CT of the head with recent fall and decreased functionality.  CT scan of the head is essentially unchanged.  Chest x-ray was  read as increasing infiltrates which could be consistent with worsening of his chronic lung disease though cannot rule out infection.  No obvious cough per family though they have not been able to see him since he was discharged.  I will start him on antibiotics for pneumonia.  He also has significant leukocytosis that was not seen during his recent hospitalization.  Renal function is also slightly worse.  His sodium had improved from his hospitalization somewhat.  We will  discuss with the hospitalist.   The patients results and plan were reviewed and discussed.   Any x-rays performed were independently reviewed by myself.   Differential diagnosis were considered with the presenting HPI.  Medications  0.9 %  sodium chloride infusion (has no administration in time range)  sodium chloride 0.9 % bolus 1,000 mL (0 mLs Intravenous Stopped 09/24/19 0050)  iohexol (OMNIPAQUE) 300 MG/ML solution 75 mL (75 mLs Intravenous Contrast Given 09/24/19 0233)    Vitals:   09/01/2019 2247 09/09/2019 2302  BP: 110/67   Pulse: 86   Resp: (!) 29   Temp: 98.8 F (37.1 C)   TempSrc: Oral   SpO2: 100% 98%    Final diagnoses:  Community acquired pneumonia, unspecified laterality  Failure to thrive in adult  Dehydration    Admission/ observation were discussed with the admitting physician, patient and/or family and they are comfortable with the plan.    Final Clinical Impression(s) / ED Diagnoses Final diagnoses:  Community acquired pneumonia, unspecified laterality  Failure to thrive in adult  Dehydration    Rx / DC Orders ED Discharge Orders    None       Deno Etienne, DO 09/24/19 [phone]

## 2019-09-24 ENCOUNTER — Other Ambulatory Visit: Payer: Self-pay

## 2019-09-24 ENCOUNTER — Inpatient Hospital Stay (HOSPITAL_COMMUNITY): Payer: PPO

## 2019-09-24 ENCOUNTER — Emergency Department (HOSPITAL_COMMUNITY): Payer: PPO

## 2019-09-24 ENCOUNTER — Encounter (HOSPITAL_COMMUNITY): Payer: Self-pay | Admitting: Internal Medicine

## 2019-09-24 DIAGNOSIS — J432 Centrilobular emphysema: Secondary | ICD-10-CM | POA: Diagnosis not present

## 2019-09-24 DIAGNOSIS — J690 Pneumonitis due to inhalation of food and vomit: Secondary | ICD-10-CM | POA: Diagnosis not present

## 2019-09-24 DIAGNOSIS — E871 Hypo-osmolality and hyponatremia: Secondary | ICD-10-CM | POA: Diagnosis present

## 2019-09-24 DIAGNOSIS — I4821 Permanent atrial fibrillation: Secondary | ICD-10-CM | POA: Diagnosis present

## 2019-09-24 DIAGNOSIS — N179 Acute kidney failure, unspecified: Secondary | ICD-10-CM | POA: Diagnosis not present

## 2019-09-24 DIAGNOSIS — I6340 Cerebral infarction due to embolism of unspecified cerebral artery: Secondary | ICD-10-CM | POA: Diagnosis not present

## 2019-09-24 DIAGNOSIS — Y95 Nosocomial condition: Secondary | ICD-10-CM | POA: Diagnosis not present

## 2019-09-24 DIAGNOSIS — Z515 Encounter for palliative care: Secondary | ICD-10-CM | POA: Diagnosis not present

## 2019-09-24 DIAGNOSIS — I255 Ischemic cardiomyopathy: Secondary | ICD-10-CM | POA: Diagnosis present

## 2019-09-24 DIAGNOSIS — I428 Other cardiomyopathies: Secondary | ICD-10-CM | POA: Diagnosis present

## 2019-09-24 DIAGNOSIS — J9601 Acute respiratory failure with hypoxia: Secondary | ICD-10-CM | POA: Diagnosis not present

## 2019-09-24 DIAGNOSIS — I472 Ventricular tachycardia: Secondary | ICD-10-CM | POA: Diagnosis not present

## 2019-09-24 DIAGNOSIS — G9341 Metabolic encephalopathy: Secondary | ICD-10-CM | POA: Diagnosis present

## 2019-09-24 DIAGNOSIS — I2510 Atherosclerotic heart disease of native coronary artery without angina pectoris: Secondary | ICD-10-CM | POA: Diagnosis not present

## 2019-09-24 DIAGNOSIS — D72829 Elevated white blood cell count, unspecified: Secondary | ICD-10-CM | POA: Diagnosis not present

## 2019-09-24 DIAGNOSIS — E43 Unspecified severe protein-calorie malnutrition: Secondary | ICD-10-CM | POA: Diagnosis present

## 2019-09-24 DIAGNOSIS — I340 Nonrheumatic mitral (valve) insufficiency: Secondary | ICD-10-CM | POA: Diagnosis not present

## 2019-09-24 DIAGNOSIS — I4819 Other persistent atrial fibrillation: Secondary | ICD-10-CM | POA: Diagnosis not present

## 2019-09-24 DIAGNOSIS — I4891 Unspecified atrial fibrillation: Secondary | ICD-10-CM | POA: Diagnosis not present

## 2019-09-24 DIAGNOSIS — R627 Adult failure to thrive: Secondary | ICD-10-CM | POA: Diagnosis not present

## 2019-09-24 DIAGNOSIS — I5032 Chronic diastolic (congestive) heart failure: Secondary | ICD-10-CM | POA: Diagnosis present

## 2019-09-24 DIAGNOSIS — E039 Hypothyroidism, unspecified: Secondary | ICD-10-CM | POA: Diagnosis present

## 2019-09-24 DIAGNOSIS — J189 Pneumonia, unspecified organism: Secondary | ICD-10-CM | POA: Diagnosis not present

## 2019-09-24 DIAGNOSIS — I469 Cardiac arrest, cause unspecified: Secondary | ICD-10-CM

## 2019-09-24 DIAGNOSIS — N390 Urinary tract infection, site not specified: Principal | ICD-10-CM

## 2019-09-24 DIAGNOSIS — J439 Emphysema, unspecified: Secondary | ICD-10-CM | POA: Diagnosis present

## 2019-09-24 DIAGNOSIS — Z681 Body mass index (BMI) 19 or less, adult: Secondary | ICD-10-CM | POA: Diagnosis not present

## 2019-09-24 DIAGNOSIS — Z66 Do not resuscitate: Secondary | ICD-10-CM | POA: Diagnosis not present

## 2019-09-24 DIAGNOSIS — J811 Chronic pulmonary edema: Secondary | ICD-10-CM | POA: Diagnosis not present

## 2019-09-24 DIAGNOSIS — I462 Cardiac arrest due to underlying cardiac condition: Secondary | ICD-10-CM | POA: Diagnosis not present

## 2019-09-24 DIAGNOSIS — I4901 Ventricular fibrillation: Secondary | ICD-10-CM | POA: Diagnosis not present

## 2019-09-24 DIAGNOSIS — I214 Non-ST elevation (NSTEMI) myocardial infarction: Secondary | ICD-10-CM | POA: Diagnosis not present

## 2019-09-24 DIAGNOSIS — Z20822 Contact with and (suspected) exposure to covid-19: Secondary | ICD-10-CM | POA: Diagnosis present

## 2019-09-24 DIAGNOSIS — I350 Nonrheumatic aortic (valve) stenosis: Secondary | ICD-10-CM | POA: Diagnosis not present

## 2019-09-24 DIAGNOSIS — Z7189 Other specified counseling: Secondary | ICD-10-CM | POA: Diagnosis not present

## 2019-09-24 DIAGNOSIS — J8410 Pulmonary fibrosis, unspecified: Secondary | ICD-10-CM | POA: Diagnosis present

## 2019-09-24 DIAGNOSIS — E872 Acidosis: Secondary | ICD-10-CM | POA: Diagnosis not present

## 2019-09-24 DIAGNOSIS — F0390 Unspecified dementia without behavioral disturbance: Secondary | ICD-10-CM | POA: Diagnosis present

## 2019-09-24 LAB — SARS CORONAVIRUS 2 (TAT 6-24 HRS): SARS Coronavirus 2: NEGATIVE

## 2019-09-24 LAB — COMPREHENSIVE METABOLIC PANEL
ALT: 37 U/L (ref 0–44)
ALT: 74 U/L — ABNORMAL HIGH (ref 0–44)
AST: 175 U/L — ABNORMAL HIGH (ref 15–41)
AST: 123 U/L — ABNORMAL HIGH (ref 15–41)
Albumin: 2.7 g/dL — ABNORMAL LOW (ref 3.5–5.0)
Albumin: 2.3 g/dL — ABNORMAL LOW (ref 3.5–5.0)
Alkaline Phosphatase: 46 U/L (ref 38–126)
Alkaline Phosphatase: 47 U/L (ref 38–126)
Anion gap: 13 (ref 5–15)
Anion gap: 12 (ref 5–15)
BUN: 18 mg/dL (ref 8–23)
BUN: 19 mg/dL (ref 8–23)
CO2: 20 mmol/L — ABNORMAL LOW (ref 22–32)
CO2: 18 mmol/L — ABNORMAL LOW (ref 22–32)
Calcium: 8.8 mg/dL — ABNORMAL LOW (ref 8.9–10.3)
Calcium: 8.2 mg/dL — ABNORMAL LOW (ref 8.9–10.3)
Chloride: 98 mmol/L (ref 98–111)
Chloride: 103 mmol/L (ref 98–111)
Creatinine, Ser: 1.21 mg/dL (ref 0.61–1.24)
Creatinine, Ser: 1.26 mg/dL — ABNORMAL HIGH (ref 0.61–1.24)
GFR calc Af Amer: >60 mL/min (ref >60)
GFR calc Af Amer: 57 mL/min — ABNORMAL LOW (ref >60)
GFR calc non Af Amer: 52 mL/min — ABNORMAL LOW (ref >60)
GFR calc non Af Amer: 50 mL/min — ABNORMAL LOW (ref >60)
Glucose, Bld: 141 mg/dL — ABNORMAL HIGH (ref 70–99)
Glucose, Bld: 130 mg/dL — ABNORMAL HIGH (ref 70–99)
Potassium: 4 mmol/L (ref 3.5–5.1)
Potassium: 3.7 mmol/L (ref 3.5–5.1)
Sodium: 131 mmol/L — ABNORMAL LOW (ref 135–145)
Sodium: 133 mmol/L — ABNORMAL LOW (ref 135–145)
Total Bilirubin: 0.9 mg/dL (ref 0.3–1.2)
Total Bilirubin: 1.2 mg/dL (ref 0.3–1.2)
Total Protein: 6.6 g/dL (ref 6.5–8.1)
Total Protein: 5.8 g/dL — ABNORMAL LOW (ref 6.5–8.1)

## 2019-09-24 LAB — CBC
HCT: 37.7 % — ABNORMAL LOW (ref 39.0–52.0)
Hemoglobin: 12.6 g/dL — ABNORMAL LOW (ref 13.0–17.0)
MCH: 34.2 pg — ABNORMAL HIGH (ref 26.0–34.0)
MCHC: 33.4 g/dL (ref 30.0–36.0)
MCV: 102.4 fL — ABNORMAL HIGH (ref 80.0–100.0)
Platelets: 236 10*3/uL (ref 150–400)
RBC: 3.68 MIL/uL — ABNORMAL LOW (ref 4.22–5.81)
RDW: 13.2 % (ref 11.5–15.5)
WBC: 14 10*3/uL — ABNORMAL HIGH (ref 4.0–10.5)
nRBC: 0 % (ref 0.0–0.2)

## 2019-09-24 LAB — SODIUM, URINE, RANDOM: Sodium, Ur: 16 mmol/L

## 2019-09-24 LAB — BLOOD GAS, ARTERIAL
Acid-base deficit: 6.5 mmol/L — ABNORMAL HIGH (ref 0.0–2.0)
Bicarbonate: 17.7 mmol/L — ABNORMAL LOW (ref 20.0–28.0)
Drawn by: 51155
FIO2: 100
O2 Saturation: 98.9 %
Patient temperature: 36.8
pCO2 arterial: 30.3 mmHg — ABNORMAL LOW (ref 32.0–48.0)
pH, Arterial: 7.382 (ref 7.350–7.450)
pO2, Arterial: 212 mmHg — ABNORMAL HIGH (ref 83.0–108.0)

## 2019-09-24 LAB — URINALYSIS, COMPLETE (UACMP) WITH MICROSCOPIC
Bilirubin Urine: NEGATIVE
Glucose, UA: NEGATIVE mg/dL
Ketones, ur: NEGATIVE mg/dL
Nitrite: POSITIVE — AB
Protein, ur: 100 mg/dL — AB
RBC / HPF: >50 RBC/hpf — ABNORMAL HIGH (ref 0–5)
Specific Gravity, Urine: 1.023 (ref 1.005–1.030)
WBC, UA: >50 WBC/hpf — ABNORMAL HIGH (ref 0–5)
pH: 6 (ref 5.0–8.0)

## 2019-09-24 LAB — POCT I-STAT 7, (LYTES, BLD GAS, ICA,H+H)
Acid-base deficit: 4 mmol/L — ABNORMAL HIGH (ref 0.0–2.0)
Bicarbonate: 20.9 mmol/L (ref 20.0–28.0)
Calcium, Ion: 1.18 mmol/L (ref 1.15–1.40)
HCT: 34 % — ABNORMAL LOW (ref 39.0–52.0)
Hemoglobin: 11.6 g/dL — ABNORMAL LOW (ref 13.0–17.0)
O2 Saturation: 100 %
Patient temperature: 98.7
Potassium: 3.5 mmol/L (ref 3.5–5.1)
Sodium: 135 mmol/L (ref 135–145)
TCO2: 22 mmol/L (ref 22–32)
pCO2 arterial: 38.7 mmHg (ref 32.0–48.0)
pH, Arterial: 7.34 — ABNORMAL LOW (ref 7.350–7.450)
pO2, Arterial: 402 mmHg — ABNORMAL HIGH (ref 83.0–108.0)

## 2019-09-24 LAB — VITAMIN B12: Vitamin B-12: 332 pg/mL (ref 180–914)

## 2019-09-24 LAB — GLUCOSE, CAPILLARY
Glucose-Capillary: 132 mg/dL — ABNORMAL HIGH (ref 70–99)
Glucose-Capillary: 124 mg/dL — ABNORMAL HIGH (ref 70–99)

## 2019-09-24 LAB — C-REACTIVE PROTEIN: CRP: 16.8 mg/dL — ABNORMAL HIGH (ref <1.0)

## 2019-09-24 LAB — OSMOLALITY, URINE: Osmolality, Ur: 719 mOsm/kg (ref 300–900)

## 2019-09-24 LAB — TYPE AND SCREEN
ABO/RH(D): A NEG
Antibody Screen: NEGATIVE

## 2019-09-24 LAB — ABO/RH: ABO/RH(D): A NEG

## 2019-09-24 LAB — TSH: TSH: 1.198 u[IU]/mL (ref 0.350–4.500)

## 2019-09-24 LAB — MAGNESIUM: Magnesium: 1.9 mg/dL (ref 1.7–2.4)

## 2019-09-24 LAB — LACTIC ACID, PLASMA
Lactic Acid, Venous: 2.1 mmol/L (ref 0.5–1.9)
Lactic Acid, Venous: 2.3 mmol/L (ref 0.5–1.9)

## 2019-09-24 LAB — FOLATE: Folate: 6.6 ng/mL (ref >5.9)

## 2019-09-24 LAB — TROPONIN I (HIGH SENSITIVITY): Troponin I (High Sensitivity): 10382 ng/L (ref <18)

## 2019-09-24 LAB — OSMOLALITY: Osmolality: 281 mOsm/kg (ref 275–295)

## 2019-09-24 MED ORDER — RIVAROXABAN 15 MG PO TABS
15.0000 mg | ORAL_TABLET | Freq: Every day | ORAL | Status: DC
  Administered 2019-09-24: 15 mg via ORAL
  Filled 2019-09-24 (×2): qty 1

## 2019-09-24 MED ORDER — FENTANYL 2500MCG IN NS 250ML (10MCG/ML) PREMIX INFUSION
25.0000 ug/h | INTRAVENOUS | Status: DC
  Administered 2019-09-24: 25 ug/h via INTRAVENOUS
  Filled 2019-09-24: qty 250

## 2019-09-24 MED ORDER — ORAL CARE MOUTH RINSE
15.0000 mL | OROMUCOSAL | Status: DC
  Administered 2019-09-25 – 2019-09-26 (×17): 15 mL via OROMUCOSAL

## 2019-09-24 MED ORDER — ONDANSETRON HCL 4 MG/2ML IJ SOLN: 4.0000 mg | Freq: Four times a day (QID) | INTRAMUSCULAR | Status: DC | PRN

## 2019-09-24 MED ORDER — DONEPEZIL HCL 10 MG PO TABS
10.0000 mg | ORAL_TABLET | Freq: Every day | ORAL | Status: DC
  Filled 2019-09-24: qty 1

## 2019-09-24 MED ORDER — OMEPRAZOLE MAGNESIUM 20 MG PO TBEC: 20.0000 mg | DELAYED_RELEASE_TABLET | Freq: Every day | ORAL | Status: DC

## 2019-09-24 MED ORDER — ERYTHROMYCIN 5 MG/GM OP OINT
1.0000 "application " | TOPICAL_OINTMENT | Freq: Four times a day (QID) | OPHTHALMIC | Status: DC
  Administered 2019-09-24 – 2019-10-03 (×33): 1 via OPHTHALMIC
  Filled 2019-09-24 (×4): qty 3.5

## 2019-09-24 MED ORDER — ESCITALOPRAM OXALATE 10 MG PO TABS
5.0000 mg | ORAL_TABLET | Freq: Every day | ORAL | Status: DC
  Administered 2019-09-24: 5 mg via ORAL
  Filled 2019-09-24: qty 1

## 2019-09-24 MED ORDER — MAGNESIUM SULFATE 2 GM/50ML IV SOLN
2.0000 g | Freq: Once | INTRAVENOUS | Status: AC
  Administered 2019-09-24: 2 g via INTRAVENOUS
  Filled 2019-09-24: qty 50

## 2019-09-24 MED ORDER — SODIUM CHLORIDE 0.9 % IV SOLN: INTRAVENOUS | Status: DC

## 2019-09-24 MED ORDER — LEVOTHYROXINE SODIUM 75 MCG PO TABS
75.0000 ug | ORAL_TABLET | Freq: Every day | ORAL | Status: DC
  Administered 2019-09-25 – 2019-10-02 (×4): 75 ug
  Filled 2019-09-24 (×6): qty 1

## 2019-09-24 MED ORDER — ALBUTEROL SULFATE HFA 108 (90 BASE) MCG/ACT IN AERS
1.0000 | INHALATION_SPRAY | Freq: Four times a day (QID) | RESPIRATORY_TRACT | Status: DC | PRN
  Filled 2019-09-24: qty 6.7

## 2019-09-24 MED ORDER — LEVOTHYROXINE SODIUM 75 MCG PO TABS
75.0000 ug | ORAL_TABLET | Freq: Every day | ORAL | Status: DC
  Administered 2019-09-24: 75 ug via ORAL
  Filled 2019-09-24: qty 1

## 2019-09-24 MED ORDER — FENTANYL CITRATE (PF) 100 MCG/2ML IJ SOLN
INTRAMUSCULAR | Status: AC
  Administered 2019-09-24: 50 ug
  Filled 2019-09-24: qty 2

## 2019-09-24 MED ORDER — SODIUM CHLORIDE 0.9 % IV SOLN: INTRAVENOUS | Status: AC

## 2019-09-24 MED ORDER — FENTANYL CITRATE (PF) 100 MCG/2ML IJ SOLN
INTRAMUSCULAR | Status: AC
  Filled 2019-09-24: qty 2

## 2019-09-24 MED ORDER — ACETAMINOPHEN 650 MG RE SUPP: 650.0000 mg | Freq: Four times a day (QID) | RECTAL | Status: DC | PRN

## 2019-09-24 MED ORDER — CHLORHEXIDINE GLUCONATE 0.12% ORAL RINSE (MEDLINE KIT)
15.0000 mL | Freq: Two times a day (BID) | OROMUCOSAL | Status: DC
  Administered 2019-09-24 – 2019-09-26 (×4): 15 mL via OROMUCOSAL

## 2019-09-24 MED ORDER — FENTANYL BOLUS VIA INFUSION
25.0000 ug | INTRAVENOUS | Status: DC | PRN
  Filled 2019-09-24: qty 25

## 2019-09-24 MED ORDER — PANTOPRAZOLE SODIUM 40 MG PO PACK
40.0000 mg | PACK | Freq: Every day | ORAL | Status: DC
  Administered 2019-09-25 – 2019-09-26 (×2): 40 mg
  Filled 2019-09-24 (×3): qty 20

## 2019-09-24 MED ORDER — TAMSULOSIN HCL 0.4 MG PO CAPS
0.4000 mg | ORAL_CAPSULE | Freq: Every day | ORAL | Status: DC
  Administered 2019-09-24: 0.4 mg via ORAL
  Filled 2019-09-24: qty 1

## 2019-09-24 MED ORDER — MIDAZOLAM HCL 2 MG/2ML IJ SOLN
INTRAMUSCULAR | Status: AC
  Filled 2019-09-24: qty 2

## 2019-09-24 MED ORDER — POLYETHYLENE GLYCOL 3350 17 G PO PACK: 17.0000 g | PACK | Freq: Every day | ORAL | Status: DC | PRN

## 2019-09-24 MED ORDER — CHLORHEXIDINE GLUCONATE CLOTH 2 % EX PADS
6.0000 | MEDICATED_PAD | Freq: Every day | CUTANEOUS | Status: DC
  Administered 2019-09-24 – 2019-09-26 (×3): 6 via TOPICAL

## 2019-09-24 MED ORDER — ACETAMINOPHEN 325 MG PO TABS: 650.0000 mg | ORAL_TABLET | ORAL | Status: DC | PRN

## 2019-09-24 MED ORDER — DONEPEZIL HCL 10 MG PO TABS
10.0000 mg | ORAL_TABLET | Freq: Every day | ORAL | Status: DC
  Administered 2019-09-25 (×2): 10 mg
  Filled 2019-09-24 (×2): qty 1

## 2019-09-24 MED ORDER — ALBUTEROL SULFATE (2.5 MG/3ML) 0.083% IN NEBU: 2.5000 mg | INHALATION_SOLUTION | Freq: Four times a day (QID) | RESPIRATORY_TRACT | Status: DC | PRN

## 2019-09-24 MED ORDER — ONDANSETRON HCL 4 MG PO TABS: 4.0000 mg | ORAL_TABLET | Freq: Four times a day (QID) | ORAL | Status: DC | PRN

## 2019-09-24 MED ORDER — DILTIAZEM HCL 60 MG PO TABS
30.0000 mg | ORAL_TABLET | Freq: Four times a day (QID) | ORAL | Status: DC
  Administered 2019-09-25: 30 mg
  Filled 2019-09-24: qty 1

## 2019-09-24 MED ORDER — IOHEXOL 300 MG/ML  SOLN
75.0000 mL | Freq: Once | INTRAMUSCULAR | Status: AC | PRN
  Administered 2019-09-24: 75 mL via INTRAVENOUS

## 2019-09-24 MED ORDER — PANTOPRAZOLE SODIUM 40 MG PO TBEC
40.0000 mg | DELAYED_RELEASE_TABLET | Freq: Every day | ORAL | Status: DC
  Administered 2019-09-24: 40 mg via ORAL
  Filled 2019-09-24: qty 1

## 2019-09-24 MED ORDER — FENTANYL CITRATE (PF) 100 MCG/2ML IJ SOLN: 25.0000 ug | Freq: Once | INTRAMUSCULAR | Status: AC

## 2019-09-24 MED ORDER — DILTIAZEM HCL ER COATED BEADS 120 MG PO CP24
120.0000 mg | ORAL_CAPSULE | Freq: Every day | ORAL | Status: DC
  Administered 2019-09-24: 120 mg via ORAL
  Filled 2019-09-24: qty 1

## 2019-09-24 MED ORDER — SODIUM CHLORIDE 0.9 % IV SOLN
1.0000 g | INTRAVENOUS | Status: AC
  Administered 2019-09-24 – 2019-09-30 (×7): 1 g via INTRAVENOUS
  Filled 2019-09-24: qty 1
  Filled 2019-09-24: qty 10
  Filled 2019-09-24: qty 1
  Filled 2019-09-24: qty 10
  Filled 2019-09-24 (×2): qty 1
  Filled 2019-09-24 (×2): qty 10

## 2019-09-24 NOTE — Consult Note (Signed)
NAME:  DANIELS THOMA, MRN:  [id number], DOB:  06/18/1926, LOS: 0 ADMISSION DATE:  09/01/2019, CONSULTATION DATE:  09/25/2019 REFERRING MD:  TRH, CHIEF COMPLAINT:  Cardiac arrest   Brief History   86 year old man with history of HFpEF, COPD, atrial fibrillation on Xarelto, CAD, pulmonary fibrosis, GERD, hypothyroidism, recent hospitalization from 3/10-3/18 for facial cellulitis and hyponatremia presenting today with acute encephalopathy found to have UTI. Evening of 3/28 had vtach arrest.  History of present illness   86 year old man with history of HFpEF, COPD, atrial fibrillation on Xarelto, CAD, pulmonary fibrosis, GERD, hypothyroidism, recent hospitalization from 3/10-3/18 for facial cellulitis and hyponatremia presenting today with acute encephalopathy.  He has been having worse lethargy over the past few days.  There have been some reported falls at the facility.  Upon arrival to [facility] ED, he was found to have a UTI and started on antibiotics.  On evening of 3/28 he was found to be in pulseless V. tach.  CPR was initiated.  He was defibrillated x1.  ROSC was achieved after 4 minutes.  He had agonal breathing and was intubated.  Past Medical History  HFpEF, COPD, atrial fibrillation on Xarelto, CAD, pulmonary fibrosis, GERD, hypothyroidism  Significant Hospital Events   3/28> vtach arrest, CPR x 4 min with 1 defib. Intubation  Consults:  Cardiology 3/28  Procedures:  None  Significant Diagnostic Tests:  CXR 3/28>Pulmonary scarring and fibrosis. No superimposed airspace disease.  CT chest w/ contrast on 3/28>1. Spectrum of findings compatible with basilar predominant fibrotic interstitial lung disease with mild honeycombing, progressed since 2008 chest CT, considered diagnostic of usual interstitial pneumonia (UIP). Mild cardiomegaly. Contrast reflux into the IVC and hepatic veins, suggesting elevated right heart pressures. Two-vessel coronary atherosclerosis. Small hiatal  hernia. Cholelithiasis. Aortic Atherosclerosis (ICD10-I70.0) and Emphysema (ICD10-J43.9).  CT head w/o contrast 3/28> No acute intracranial abnormality.  Micro Data:  COVID 3/28> neg Blood Cx 3/28> NGTD UCx 3/28>   Antimicrobials:  Ceftriaxone 3/28>  Interim history/subjective:  Intubated, moving extremities spontaneously and able to nod to questions  Objective   Blood pressure 108/76, pulse (!) 105, temperature 98.2 F (36.8 C), temperature source Oral, resp. rate 18, SpO2 100 %.    Vent Mode: PRVC FiO2 (%):  [100 %] 100 % Set Rate:  [15 bmp] 15 bmp Vt Set:  [600 mL] 600 mL PEEP:  [5 cmH20] 5 cmH20 Plateau Pressure:  [21 cmH20] 21 cmH20   Intake/Output Summary (Last 24 hours) at 09/24/2019 2142 Last data filed at 09/24/2019 1709 Gross per 24 hour  Intake 2125 ml  Output --  Net 2125 ml   There were no vitals filed for this visit.  Examination: General: NAD HENT: EOMI, Anicteric sclera Lungs: Few rales Cardiovascular: Irregular Abdomen: Soft, nondistended Extremities: No edema Neuro: Arousable, no gross abnormalities GU: Foley in place  Assessment & Plan:  Cardiac arrest: v tach with defibrillation. Normotensive presently. EKG with a fib and prolonged QTc --Trend troponins --Repeat EKG in AM --Cardiology consulted --Discontinue Lexapro --Replete electrolytes for goal K>4 and Mg>2 --Obtain TTE --Continue vent support.  SAT/SBT in a.m.  UTI: Leukocytosis is improving --Continue ceftriaxone --Follow up urine culture  Atrial fibrillation: --Hold home anticoagulation.  Heparin gtt. --Continue home diltiazem  AKI: Cr 1.26 and baseline around 1 --Received IV fluids prior --Monitor  Metabolic acidosis without increased anion gap: Likely from AKI.  Will monitor. Recheck lactic acid.  Hyponatremia:  --Monitor Na. Improving.  Transaminitis: --Improving AST. --Monitor  Chronic anemia: Hemoglobin is  around baseline of 12  COPD: Continue albuterol as  needed  Dementia: Continue Aricept   Hypothyroidism: Continue home Synthroid  BPH: Continue home Flomax  Best practice:  Diet: N.p.o. Pain/Anxiety/Delirium protocol (if indicated): Fentanyl gtt and as needed VAP protocol (if indicated): Ordered DVT prophylaxis: Heparin GI prophylaxis: PPI Glucose control: Monitor Mobility: PT when able Code Status: Full Family Communication: Discussed plan with son and daughter-in-law Disposition: ICU  Labs   CBC: Recent Labs  Lab 09/24/2019 2252 09/24/19 2103 09/24/19 2138  WBC 17.0* 14.0*  --   NEUTROABS 13.9*  --   --   HGB 13.0 12.6* 11.6*  HCT 37.3* 37.7* 34.0*  MCV 98.2 102.4*  --   PLT 222 236  --     Basic Metabolic Panel: Recent Labs  Lab 09/20/2019 2252 09/24/19 2138  NA 131* 135  K 4.0 3.5  CL 98  --   CO2 20*  --   GLUCOSE 141*  --   BUN 18  --   CREATININE 1.21  --   CALCIUM 8.8*  --    GFR: Estimated Creatinine Clearance: 39.1 mL/min (by C-G formula based on SCr of 1.21 mg/dL). Recent Labs  Lab 08/29/2019 2252 09/24/19 0350 09/24/19 0545 09/24/19 2103  WBC 17.0*  --   --  14.0*  LATICACIDVEN  --  2.1* 2.3*  --     Liver Function Tests: Recent Labs  Lab 09/14/2019 2252  AST 175*  ALT 37  ALKPHOS 46  BILITOT 0.9  PROT 6.6  ALBUMIN 2.7*    ABG    Component Value Date/Time   PHART 7.340 (L) 09/24/2019 2138   PCO2ART 38.7 09/24/2019 2138   PO2ART 402.0 (H) 09/24/2019 2138   HCO3 20.9 09/24/2019 2138   TCO2 22 09/24/2019 2138   ACIDBASEDEF 4.0 (H) 09/24/2019 2138   O2SAT 100.0 09/24/2019 2138     Coagulation Profile: Recent Labs  Lab 09/19/2019 2252  INR 3.1*    CBG: Recent Labs  Lab 09/24/19 2041 09/24/19 2136  GLUCAP 132* 124*    Review of Systems:   Unable to obtain due to intubation and sedation  Past Medical History  He,  has a past medical history of Arthritis, CHF (congestive heart failure) ([place]), COPD (chronic obstructive pulmonary disease) ([place]), Dysrhythmia, GERD  (gastroesophageal reflux disease), and Pulmonary fibrosis ([place]).   Surgical History    Past Surgical History:  Procedure Laterality Date  . hemmroidectmy    . HERNIA REPAIR    . LEFT HEART CATHETERIZATION WITH CORONARY ANGIOGRAM N/A 10/07/2012   Procedure: LEFT HEART CATHETERIZATION WITH CORONARY ANGIOGRAM, possible PCI;  Surgeon: Jettie Booze, MD;  Location: [facility] CATH LAB;  Service: Cardiovascular;  Laterality: N/A;  . TONSILLECTOMY       Social History   reports that he quit smoking about 58 years ago. His smoking use included cigarettes. He has a 80.00 pack-year smoking history. He has never used smokeless tobacco. He reports current alcohol use of about 2.0 standard drinks of alcohol per week. He reports that he does not use drugs.   Family History   His family history includes Heart attack in his mother; Pulmonary embolism in his father.   Allergies No Known Allergies   Home Medications  Prior to Admission medications   Medication Sig Start Date End Date Taking? Authorizing Provider  acetaminophen (TYLENOL) 325 MG tablet Take 650 mg by mouth every 6 (six) hours as needed.   Yes [provider]  albuterol (PROVENTIL HFA;VENTOLIN HFA)  108 (90 BASE) MCG/ACT inhaler Inhale 1-2 puffs into the lungs every 6 (six) hours as needed for wheezing or shortness of breath.   Yes [provider]  calcium-vitamin D (OSCAL WITH D) 500-200 MG-UNIT tablet Take 1 tablet by mouth daily.    Yes [provider]  diltiazem (CARDIZEM CD) 120 MG 24 hr capsule Take 120 mg by mouth daily.   Yes [provider]  donepezil (ARICEPT) 10 MG tablet Take 1 tablet (10 mg total) by mouth at bedtime. 10/09/12  Yes Norins, Heinz Knuckles, MD  erythromycin ophthalmic ointment Place 1 application into the left eye 4 (four) times daily. Apply to skin vesicles on eyelid and forehead 03/24/19  Yes Bernarda Caffey, MD  escitalopram (LEXAPRO) 10 MG tablet Take 5 mg by mouth daily.   Yes  [provider]  fexofenadine (ALLEGRA) 180 MG tablet Take 180 mg by mouth daily as needed for allergies or rhinitis.   Yes [provider]  furosemide (LASIX) 20 MG tablet Take 20 mg by mouth 3 (three) times a week. Take one tablet by mouth every Monday, Wednesday and Friday.   Yes [provider]  levothyroxine (SYNTHROID, LEVOTHROID) 75 MCG tablet ONE TABLET DAILY BEFORE BREAKFAST Patient taking differently: Take 75 mcg by mouth daily before breakfast.    Yes Norins, Heinz Knuckles, MD  loperamide (ANTI-DIARRHEAL) 2 MG capsule Take 2 mg by mouth as needed for diarrhea or loose stools.   Yes [provider]  omeprazole (PRILOSEC OTC) 20 MG tablet Take 20 mg by mouth daily.   Yes [provider]  Rivaroxaban (XARELTO) 15 MG TABS tablet Take 1 tablet (15 mg total) by mouth daily with supper. 08/23/13  Yes Jettie Booze, MD  solifenacin (VESICARE) 10 MG tablet Take 10 mg by mouth every morning.    Yes [provider]  tamsulosin (FLOMAX) 0.4 MG CAPS capsule Take 1 capsule daily by mouth. 04/15/17  Yes [provider]  vitamin B-12 (CYANOCOBALAMIN) 500 MCG tablet Take 500 mcg by mouth 2 (two) times a week. Mon and Thurs   Yes [provider]  PRESCRIPTION MEDICATION 10 mLs by Mouth Rinse route in the morning and at bedtime. CLOSYS Rinse 0.25% ; use after brushing teeth every morning, and evening. Do not swallow.    [provider]     Critical care time: The patient is critically ill with multiple organ systems failure and requires high complexity decision making for assessment and support, frequent evaluation and titration of therapies, application of advanced monitoring technologies and extensive interpretation of multiple databases.   Critical Care Time devoted to patient care services described in this note is  41 Minutes. This time reflects time of care of this signee. This critical care time does not reflect  procedure time, or teaching time or supervisory time of PA/NP/Med student/Med Resident etc but could involve care discussion time.  Jacques Earthly, M.D. [facility] Pulmonary/Critical Care Medicine After hours pager: [phone].

## 2019-09-24 NOTE — ED Notes (Signed)
Pt incontinent of urine. Pt clean adult brief changed condom cath applied.

## 2019-09-24 NOTE — ED Notes (Signed)
Patient adult diaper changed and patient repositioned in the bed. No complaints at present.

## 2019-09-24 NOTE — H&P (Signed)
History and Physical    Cameron [name] CVU:[id number] DOB: 01-25-1926 DOA: 09/08/2019  PCP: Lavone Orn, MD  Patient coming from: [place]   Chief Complaint:  Chief Complaint  Patient presents with  . Fall     HPI:    86 year old male with past medical history of gastroesophageal reflux disease, COPD, chronic atrial fibrillation on anticoagulation, diastolic congestive heart failure and recent admission to [facility] for odontogenic facial cellulitis who presents to [facility] emergency department after exhibiting a several day history of progressively worsening lethargy and confusion.  Patient is an extremely poor historian due to confusion and lethargy and therefore the majority of the history has been obtained from the daughter Cameron [name] via phone conversation.  Patient was discharged to [facility] skilled nursing facility on 3/18 after being hospitalized for odontogenic facial cellulitis on oral antibiotic therapy.  Completed this course of antibiotic therapy.  Patient was still within his 14-day quarantine while at the facility since being sent there which is standard operating procedure.  According to the daughter who has been speaking to the patient daily, the patient has been experiencing progressively worsening lethargy and confusion, particularly in the past 3 days.  This was associated with at least 2 episodes of falls at the facility as well.  The daughter is not aware of any others, cough, nausea, vomiting, complaints of pain or diarrhea over the span of time.  Due to progressively worsening confusion and lethargy the patient eventually was brought into [facility] emergency department for evaluation.  Upon evaluation in the emergency department patient was found to be quite lethargic and disoriented with a leukocytosis of 17.0.  Chest x-ray was performed revealing some small worsening of patient's chronic coarse  interstitial markings.  Patient was clinically felt to be dehydrated.  Hospitalist group was called to assess patient for admission to the hospital for encephalopathy, dehydration and possible underlying infectious process.    Review of Systems: Unable to form due to acute metabolic encephalopathy.  Past Medical History:  Diagnosis Date  . Arthritis   . CHF (congestive heart failure) ([place])   . COPD (chronic obstructive pulmonary disease) ([place])   . Dysrhythmia    atrial fibrilation  . GERD (gastroesophageal reflux disease)     Past Surgical History:  Procedure Laterality Date  . hemmroidectmy    . HERNIA REPAIR    . LEFT HEART CATHETERIZATION WITH CORONARY ANGIOGRAM N/A 10/07/2012   Procedure: LEFT HEART CATHETERIZATION WITH CORONARY ANGIOGRAM, possible PCI;  Surgeon: Jettie Booze, MD;  Location: [facility] CATH LAB;  Service: Cardiovascular;  Laterality: N/A;  . TONSILLECTOMY       reports that he quit smoking about 58 years ago. His smoking use included cigarettes. He has a 80.00 pack-year smoking history. He has never used smokeless tobacco. He reports current alcohol use of about 2.0 standard drinks of alcohol per week. He reports that he does not use drugs.  No Known Allergies  Family History  Problem Relation Age of Onset  . Heart attack Mother   . Pulmonary embolism Father      Prior to Admission medications   Medication Sig Start Date End Date Taking? Authorizing Provider  acetaminophen (TYLENOL) 325 MG tablet Take 650 mg by mouth every 6 (six) hours as needed.   Yes [provider]  albuterol (PROVENTIL HFA;VENTOLIN HFA) 108 (90 BASE) MCG/ACT inhaler Inhale 1-2 puffs into the lungs every 6 (six) hours as needed for wheezing or  shortness of breath.   Yes [provider]  calcium-vitamin D (OSCAL WITH D) 500-200 MG-UNIT tablet Take 1 tablet by mouth daily.    Yes [provider]  diltiazem (CARDIZEM CD) 120 MG 24 hr capsule Take 120 mg by mouth  daily.   Yes [provider]  donepezil (ARICEPT) 10 MG tablet Take 1 tablet (10 mg total) by mouth at bedtime. 10/09/12  Yes Norins, Heinz Knuckles, MD  erythromycin ophthalmic ointment Place 1 application into the left eye 4 (four) times daily. Apply to skin vesicles on eyelid and forehead 03/24/19  Yes Bernarda Caffey, MD  escitalopram (LEXAPRO) 10 MG tablet Take 5 mg by mouth daily.   Yes [provider]  fexofenadine (ALLEGRA) 180 MG tablet Take 180 mg by mouth daily as needed for allergies or rhinitis.   Yes [provider]  furosemide (LASIX) 20 MG tablet Take 20 mg by mouth 3 (three) times a week. Take one tablet by mouth every Monday, Wednesday and Friday.   Yes [provider]  levothyroxine (SYNTHROID, LEVOTHROID) 75 MCG tablet ONE TABLET DAILY BEFORE BREAKFAST Patient taking differently: Take 75 mcg by mouth daily before breakfast.    Yes Norins, Heinz Knuckles, MD  loperamide (ANTI-DIARRHEAL) 2 MG capsule Take 2 mg by mouth as needed for diarrhea or loose stools.   Yes [provider]  omeprazole (PRILOSEC OTC) 20 MG tablet Take 20 mg by mouth daily.   Yes [provider]  Rivaroxaban (XARELTO) 15 MG TABS tablet Take 1 tablet (15 mg total) by mouth daily with supper. 08/23/13  Yes Jettie Booze, MD  solifenacin (VESICARE) 10 MG tablet Take 10 mg by mouth every morning.    Yes [provider]  tamsulosin (FLOMAX) 0.4 MG CAPS capsule Take 1 capsule daily by mouth. 04/15/17  Yes [provider]  vitamin B-12 (CYANOCOBALAMIN) 500 MCG tablet Take 500 mcg by mouth 2 (two) times a week. Mon and Thurs   Yes [provider]  PRESCRIPTION MEDICATION 10 mLs by Mouth Rinse route in the morning and at bedtime. CLOSYS Rinse 0.25% ; use after brushing teeth every morning, and evening. Do not swallow.    [provider]    Physical Exam: Vitals:   09/19/2019 2247 09/22/2019 2302  BP: 110/67   Pulse: 86   Resp: (!) 29     Temp: 98.8 F (37.1 C)   TempSrc: Oral   SpO2: 100% 98%    Constitutional: Lethargic but arousable, oriented x1, not in any acute distress.  Thin build noted. Skin: no rashes, no lesions, poor skin turgor noted. Eyes: Pupils are equally reactive to light.  No evidence of scleral icterus or conjunctival pallor.  ENMT: Dry mucous membranes.  Posterior pharynx clear of any exudate or lesions. Normal dentition.   Neck: normal, supple, no masses, no thyromegaly Respiratory: clear to auscultation bilaterally, no wheezing no org, no crackles. Normal respiratory effort. No accessory muscle use.  Cardiovascular: Irregularly irregular rate and rhythm.  No murmurs / rubs / gallops. No extremity edema. 2+ pedal pulses. No carotid bruits.  Back:   Nontender without crepitus or deformity. Abdomen: Abdomen is soft and nontender.  No evidence of intra-abdominal masses.  Positive bowel sounds noted in all quadrants.   Musculoskeletal: No joint deformity upper and lower extremities. Good ROM, no contractures. Normal muscle tone.  Neurologic: CN 2-12 grossly intact.  Patient is moving all 4 extremities spontaneously.   Patient is lethargic and intermittently following commands.  Patient is responsive to verbal stimuli.   Psychiatric: Depressed mood with flat affect.  Currently patient does not seem to possess insight as to his current situation.     Labs on Admission: I have personally reviewed following labs and imaging studies -   CBC: Recent Labs  Lab 08/29/2019 2252  WBC 17.0*  NEUTROABS 13.9*  HGB 13.0  HCT 37.3*  MCV 98.2  PLT 381   Basic Metabolic Panel: Recent Labs  Lab 09/05/2019 2252  NA 131*  K 4.0  CL 98  CO2 20*  GLUCOSE 141*  BUN 18  CREATININE 1.21  CALCIUM 8.8*   GFR: Estimated Creatinine Clearance: 39.1 mL/min (by C-G formula based on SCr of 1.21 mg/dL). Liver Function Tests: Recent Labs  Lab 08/31/2019 2252  AST 175*  ALT 37  ALKPHOS 46  BILITOT 0.9  PROT 6.6   ALBUMIN 2.7*   No results for input(s): LIPASE, AMYLASE in the last 168 hours. No results for input(s): AMMONIA in the last 168 hours. Coagulation Profile: Recent Labs  Lab 09/06/2019 2252  INR 3.1*   Cardiac Enzymes: No results for input(s): CKTOTAL, CKMB, CKMBINDEX, TROPONINI in the last 168 hours. BNP (last 3 results) No results for input(s): PROBNP in the last 8760 hours. HbA1C: No results for input(s): HGBA1C in the last 72 hours. CBG: No results for input(s): GLUCAP in the last 168 hours. Lipid Profile: No results for input(s): CHOL, HDL, LDLCALC, TRIG, CHOLHDL, LDLDIRECT in the last 72 hours. Thyroid Function Tests: No results for input(s): TSH, T4TOTAL, FREET4, T3FREE, THYROIDAB in the last 72 hours. Anemia Panel: No results for input(s): VITAMINB12, FOLATE, FERRITIN, TIBC, IRON, RETICCTPCT in the last 72 hours. Urine analysis:    Component Value Date/Time   COLORURINE YELLOW 09/06/2019 2149   APPEARANCEUR HAZY (A) 09/06/2019 2149   LABSPEC 1.019 09/06/2019 2149   PHURINE 6.0 09/06/2019 2149   GLUCOSEU NEGATIVE 09/06/2019 2149   HGBUR NEGATIVE 09/06/2019 2149   BILIRUBINUR NEGATIVE 09/06/2019 2149   [place] NEGATIVE 09/06/2019 2149   PROTEINUR 30 (A) 09/06/2019 2149   UROBILINOGEN 0.2 07/12/2013 2230   NITRITE NEGATIVE 09/06/2019 2149   LEUKOCYTESUR NEGATIVE 09/06/2019 2149    Radiological Exams on Admission: CT Head Wo Contrast  Result Date: 09/24/2019 CLINICAL DATA:  Head trauma EXAM: CT HEAD WITHOUT CONTRAST TECHNIQUE: Contiguous axial images were obtained from the base of the skull through the vertex without intravenous contrast. COMPARISON:  September 06, 2019 FINDINGS: Brain: No evidence of acute territorial infarction, hemorrhage, hydrocephalus,extra-axial collection or mass lesion/mass effect. There is dilatation the ventricles and sulci consistent with age-related atrophy. Low-attenuation changes in the deep white matter consistent with small vessel  ischemia. Vascular: No hyperdense vessel or unexpected calcification. Skull: The skull is intact. No fracture or focal lesion identified. Sinuses/Orbits: The visualized paranasal sinuses and mastoid air cells are clear. The orbits and globes intact. Other: None IMPRESSION: No acute intracranial abnormality. Findings consistent with age related atrophy and chronic small vessel ischemia Electronically Signed   By: Prudencio Pair M.D.   On: 09/24/2019 00:04   DG Chest Port 1 View  Result Date: 09/24/2019 CLINICAL DATA:  Change in mental status EXAM: PORTABLE CHEST 1 VIEW COMPARISON:  May 28, 2015 FINDINGS: The heart size and mediastinal contours are unchanged with mild cardiomegaly. Again noted are coarsened interstitial markings throughout both lungs with biapical scarring. Aortic knob calcifications. No acute osseous abnormality. IMPRESSION: Cardiomegaly Slight interval worsening in the coarsened interstitial markings seen throughout both lungs which may  be due to worsening in chronic lung disease, however can not exclude concomitant infectious etiology. Electronically Signed   By: Prudencio Pair M.D.   On: 09/24/2019 00:52    EKG: Personally reviewed.  Rhythm is atrial fibrillation with heart rate of 85 bpm.  With incomplete right bundle branch block no dynamic ST segment changes appreciated.  Assessment/Plan Active Problems:   Acute metabolic encephalopathy   Patient exhibiting substantial lethargy confusion and disorientation based on my assessment.  Multiple contributing etiologies including clinical volume depletion as well as likely underlying infectious process.  Performing thorough evaluation for infection including repeat CT imaging of the head that reveals resolution of odontogenic infection and facial cellulitis.  Chest x-ray reveals questionable advancement of interstitial infiltrates that are chronic and therefore will obtain CT imaging of the chest to identify any underlying focal  infiltrate suggestive of pneumonia.  Patient is unable to produce urine for [place] and therefore we will perform a straight catheterization and evaluate urinalysis for any evidence of urinary tract infection.  Based on work-up for infectious etiology will initiate intravenous antibiotics if indicated.  Obtaining TSH, vitamin B12, folate  Hydrating patient gently with intravenous isotonic fluids  Reviewing home medications for any sedating agents.  Monitoring patient for clinical improvement with intravenous hydration.    Hyponatremia, suspected SIADH   Patient was recently diagnosed with suspected SIADH hyponatremia noted during last hospitalization in early March  Patient was treated with volume restriction  Sodium is currently  131, higher than at time of discharge.  Discontinuing volume restriction  Hydrating patient gently which will likely need to be promptly discontinued once volume depletion resolves  Serial chemistries to monitor sodium    Leukocytosis   Concern for underlying infectious process, see comments above.  Hypothyroidism   Continue home regimen of Synthroid  Obtaining TSH to ensure that this is at target.    Persistent atrial fibrillation (HCC)   Continue home regimen of diltiazem  Monitoring patient on telemetry  Patient is rate controlled at this time    Chronic diastolic HF (heart failure) (HCC)   No evidence of cardiogenic volume overload, in fact patient is currently seemingly volume depleted.    Dementia ([place])   Known longstanding history of mild dementia, complicating patient's presentation.  Continue home regimen of Aricept     Code Status:  Full code Family Communication: Discussed patient's care with daughter Cameron [name] Disposition Plan: Patient is anticipated to be discharged to skilled nursing facility once patient has met maximum benefit from current hospitalization.   Consults called: None Admission status: Patient will  be admitted to Observation and is anticipated to remain in the hospital for less than 2 midnights.   Vernelle Emerald MD [facility] Pager [phone]  If 7PM-7AM, please contact night-coverage [email or web address] Use universal [place] password for that web site. If you do not have the password, please call the hospital operator.  09/24/2019, 2:14 AM

## 2019-09-24 NOTE — Code Documentation (Signed)
  Patient Name: Cameron [name]   MRN: [id number]   Date of Birth/ Sex: 1925-12-18 , male      Admission Date: 09/20/2019  Attending Provider: Geradine Girt, DO  Primary Diagnosis: <principal problem not specified>   Indication: Pt was in his usual state of health until this PM, when he was noted to be pulseless V. Tachycardia. Code blue was subsequently called. At the time of arrival on scene, ACLS protocol was underway. Rapid response at bedside, patient was noted to be in ROSC. Respiratory obtained airway shortly after.    Technical Description:  - CPR performance duration:  4 minutes  - Was defibrillation or cardioversion used? Yes   - Was external pacer placed? No  - Was patient intubated pre/post CPR? Yes   Medications Administered: Y = Yes; Blank = No Amiodarone    Atropine    Calcium    Epinephrine    Lidocaine    Magnesium    Norepinephrine    Phenylephrine    Sodium bicarbonate    Vasopressin     Versed,fentanyl   Post CPR evaluation:  - Final Status - Was patient successfully resuscitated ? Yes - What is current rhythm? A. fib - What is current hemodynamic status? stable  Miscellaneous Information:  - Labs sent, including: EKG, CMP, Mg, CBC, CXR  - Primary team notified?  Yes, report given to hospitalist at bedside  - Family Notified? Yes, spoke to son and daughter in law.   - Additional notes/ transfer status:  Sign out given to Dr. Randell Patient who resumed care for patient once in [place].      Cameron Mercury, MD  09/24/2019, 10:36 PM

## 2019-09-24 NOTE — ED Notes (Signed)
All labs drawn, labeled with 2 pt identifiers, and sent to lab

## 2019-09-24 NOTE — Consult Note (Signed)
[facility] Consult Note   Primary Physician:  Lavone Orn, MD  Primary Cardiologist:   Jettie Booze, MD   Reason for Consultation: Cardiac arrest s/p successful resuscitation  HPI:    Cameron [name] is a 86 year-old-gentleman with a past medical history significant for chronic atrial fibrillation (on anticoagulation), diastolic heart failure, non-obstructive CAD, dementia, GERD, COPD, and hypothyroidism who was recently in the hospital for facial cellulitis.  On March 18 patient was discharged to [facility] skilled nursing facility after being treated for his facial cellulitis with antibiotics. He was noted to have progressively worsening lethargy and confusion for the past several days.  He therefore returned to [facility] emergency department.  He had leukocytosis with a white cell count of 17.0.  Patient was markedly dehydrated.  The chest x-ray suggested worsening of the interstitial markings.  There was concern for possible underlying infection.  A chest CT with contrast (09/24/2019) revealed basilar fibrotic interstitial lung disease with mild honeycombing diagnostic of interstitial pneumonia.  There was mild cardiomegaly, contrast reflux into the IVC and hepatic veins, suggesting elevated right heart pressures.  There was also two-vessel coronary atherosclerosis with involvement of the LAD and left circumflex arteries.  Earlier today the patient was documented to have a run of VT. He had thready carotid pulses and agonal breathing. For severe hemodynamic compromise he was defibrillated x 1 with 120J. Also intubated during the code. Post ROSC rhythm was atrial fibrillation with heart rate in the 90s.  QTc prolonged at 537 ms.   Cardiac testing: #2 ECG (09/24/2019 21:48:48) - atrial fibrillation, rate 93 bpm, inferior infarct, ST depressions in V3 - V4, QTc 537   #1 ECG (09/22/2019 22:55:30) - atrial fibrillation, rate 85 bpm, incomplete RBBB, Q waves in II, III,  aVF, V1 to V3. St depressions V2, V3, I and aVL. ST elevation in aVR, QTc 483   Home Medications Prior to Admission medications   Medication Sig Start Date End Date Taking? Authorizing Provider  acetaminophen (TYLENOL) 325 MG tablet Take 650 mg by mouth every 6 (six) hours as needed.   Yes [provider]  albuterol (PROVENTIL HFA;VENTOLIN HFA) 108 (90 BASE) MCG/ACT inhaler Inhale 1-2 puffs into the lungs every 6 (six) hours as needed for wheezing or shortness of breath.   Yes [provider]  calcium-vitamin D (OSCAL WITH D) 500-200 MG-UNIT tablet Take 1 tablet by mouth daily.    Yes [provider]  diltiazem (CARDIZEM CD) 120 MG 24 hr capsule Take 120 mg by mouth daily.   Yes [provider]  donepezil (ARICEPT) 10 MG tablet Take 1 tablet (10 mg total) by mouth at bedtime. 10/09/12  Yes Norins, Heinz Knuckles, MD  erythromycin ophthalmic ointment Place 1 application into the left eye 4 (four) times daily. Apply to skin vesicles on eyelid and forehead 03/24/19  Yes Bernarda Caffey, MD  escitalopram (LEXAPRO) 10 MG tablet Take 5 mg by mouth daily.   Yes [provider]  fexofenadine (ALLEGRA) 180 MG tablet Take 180 mg by mouth daily as needed for allergies or rhinitis.   Yes [provider]  furosemide (LASIX) 20 MG tablet Take 20 mg by mouth 3 (three) times a week. Take one tablet by mouth every Monday, Wednesday and Friday.   Yes [provider]  levothyroxine (SYNTHROID, LEVOTHROID) 75 MCG tablet ONE TABLET DAILY BEFORE BREAKFAST Patient taking differently: Take 75 mcg by mouth daily before breakfast.    Yes Norins, Legrand Como  E, MD  loperamide (ANTI-DIARRHEAL) 2 MG capsule Take 2 mg by mouth as needed for diarrhea or loose stools.   Yes [provider]  omeprazole (PRILOSEC OTC) 20 MG tablet Take 20 mg by mouth daily.   Yes [provider]  Rivaroxaban (XARELTO) 15 MG TABS tablet Take 1 tablet (15 mg total) by mouth daily  with supper. 08/23/13  Yes Jettie Booze, MD  solifenacin (VESICARE) 10 MG tablet Take 10 mg by mouth every morning.    Yes [provider]  tamsulosin (FLOMAX) 0.4 MG CAPS capsule Take 1 capsule daily by mouth. 04/15/17  Yes [provider]  vitamin B-12 (CYANOCOBALAMIN) 500 MCG tablet Take 500 mcg by mouth 2 (two) times a week. Mon and Thurs   Yes [provider]  PRESCRIPTION MEDICATION 10 mLs by Mouth Rinse route in the morning and at bedtime. CLOSYS Rinse 0.25% ; use after brushing teeth every morning, and evening. Do not swallow.    [provider]    Past Medical History: Past Medical History:  Diagnosis Date  . Arthritis   . CHF (congestive heart failure) ([place])   . COPD (chronic obstructive pulmonary disease) ([place])   . Dysrhythmia    atrial fibrilation  . GERD (gastroesophageal reflux disease)   . Pulmonary fibrosis ([place])     Past Surgical History: Past Surgical History:  Procedure Laterality Date  . hemmroidectmy    . HERNIA REPAIR    . LEFT HEART CATHETERIZATION WITH CORONARY ANGIOGRAM N/A 10/07/2012   Procedure: LEFT HEART CATHETERIZATION WITH CORONARY ANGIOGRAM, possible PCI;  Surgeon: Jettie Booze, MD;  Location: [facility] CATH LAB;  Service: Cardiovascular;  Laterality: N/A;  . TONSILLECTOMY      Family History: Family History  Problem Relation Age of Onset  . Heart attack Mother   . Pulmonary embolism Father     Social History: Social History   Socioeconomic History  . Marital status: Widowed    Spouse name: Not on file  . Number of children: 4  . Years of education: Not on file  . Highest education level: Bachelor's degree (e.g., BA, AB, BS)  Occupational History  . Not on file  Tobacco Use  . Smoking status: Former Smoker    Packs/day: 2.00    Years: 40.00    Pack years: 80.00    Types: Cigarettes    Quit date: 06/29/1961    Years since quitting: 58.2  . Smokeless tobacco: Never Used  Substance and Sexual  Activity  . Alcohol use: Yes    Alcohol/week: 2.0 standard drinks    Types: 2 Glasses of wine per week    Comment: occasional  . Drug use: No  . Sexual activity: Never  Other Topics Concern  . Not on file  Social History Narrative   Pt lives at [facility], pt is a widower, he has 4 children   Right handed, BS degree   Pt drinks coffee, tea, and soda when he can   He does not exercise regularly   Sometime walk      Social Determinants of Health   Financial Resource Strain:   . Difficulty of Paying Living Expenses:   Food Insecurity:   . Worried About Charity fundraiser in the Last Year:   . Arboriculturist in the Last Year:   Transportation Needs:   . Film/video editor (Medical):   Marland Kitchen Lack of Transportation (Non-Medical):   Physical Activity:   . Days of  Exercise per Week:   . Minutes of Exercise per Session:   Stress:   . Feeling of Stress :   Social Connections:   . Frequency of Communication with Friends and Family:   . Frequency of Social Gatherings with Friends and Family:   . Attends Religious Services:   . Active Member of Clubs or Organizations:   . Attends Archivist Meetings:   Marland Kitchen Marital Status:     Allergies:  No Known Allergies   Review of Systems: [y] = yes, [ ]  = no .................. unable to assess (pt is intubated)  . General: Weight gain [ ] ; Weight loss [ ] ; Anorexia [ ] ; Fatigue [ ] ; Fever [ ] ; Chills [ ] ; Weakness [ ]   . Cardiac: Chest pain/pressure [ ] ; Resting SOB [ ] ; Exertional SOB [ ] ; Orthopnea [ ] ; Pedal Edema [ ] ; Palpitations [ ] ; Syncope [ ] ; Presyncope [ ] ; Paroxysmal nocturnal dyspnea[ ]   . Pulmonary: Cough [ ] ; Wheezing[ ] ; Hemoptysis[ ] ; Sputum [ ] ; Snoring [ ]   . GI: Vomiting[ ] ; Dysphagia[ ] ; Melena[ ] ; Hematochezia [ ] ; Heartburn[ ] ; Abdominal pain [ ] ; Constipation [ ] ; Diarrhea [ ] ; BRBPR [ ]   . GU: Hematuria[ ] ; Dysuria [ ] ; Nocturia[ ]   . Vascular: Pain in legs with walking [ ] ; Pain in feet with lying flat  [ ] ; Non-healing sores [ ] ; Stroke [ ] ; TIA [ ] ; Slurred speech [ ] ;  . Neuro: Headaches[ ] ; Vertigo[ ] ; Seizures[ ] ; Paresthesias[ ] ;Blurred vision [ ] ; Diplopia [ ] ; Vision changes [ ]   . Ortho/Skin: Arthritis [ ] ; Joint pain [ ] ; Muscle pain [ ] ; Joint swelling [ ] ; Back Pain [ ] ; Rash [ ]   . Psych: Depression[ ] ; Anxiety[ ]   . Heme: Bleeding problems [ ] ; Clotting disorders [ ] ; Anemia [ ]   . Endocrine: Diabetes [ ] ; Thyroid dysfunction[ ]      Objective:    Vital Signs:   Temp:  [97.6 F (36.4 C)-98.8 F (37.1 C)] 98.2 F (36.8 C) (03/28 2014) Pulse Rate:  [73-105] 105 (03/28 2116) Resp:  [15-29] 18 (03/28 2116) BP: (98-146)/(57-85) 108/76 (03/28 2116) SpO2:  [96 %-100 %] 100 % (03/28 2116) FiO2 (%):  [100 %] 100 % (03/28 2116)    Weight change: There were no vitals filed for this visit.  Intake/Output:   Intake/Output Summary (Last 24 hours) at 09/24/2019 2142 Last data filed at 09/24/2019 1709 Gross per 24 hour  Intake 2125 ml  Output --  Net 2125 ml      Physical Exam    General:  Intubated HEENT: Unable to assess Neck: supple Cor: Irregularly irregular rhythm Lungs: Coarse breath sounds Abdomen: soft, nontender, nondistended. No hepatosplenomegaly. No bruits or masses.  Extremities: no cyanosis, clubbing, rash, edema Neuro: Unable to assess Psych: Unable to assess    Labs   Basic Metabolic Panel: Recent Labs  Lab 09/01/2019 2252 09/24/19 2138  NA 131* 135  K 4.0 3.5  CL 98  --   CO2 20*  --   GLUCOSE 141*  --   BUN 18  --   CREATININE 1.21  --   CALCIUM 8.8*  --     Liver Function Tests: Recent Labs  Lab 08/31/2019 2252  AST 175*  ALT 37  ALKPHOS 46  BILITOT 0.9  PROT 6.6  ALBUMIN 2.7*   No results for input(s): LIPASE, AMYLASE in the last 168 hours. No results for input(s): AMMONIA in the last 168 hours.  CBC: Recent Labs  Lab 09/25/2019 2252 09/24/19 2103 09/24/19 2138  WBC 17.0* 14.0*  --   NEUTROABS 13.9*  --   --   HGB 13.0  12.6* 11.6*  HCT 37.3* 37.7* 34.0*  MCV 98.2 102.4*  --   PLT 222 236  --     Cardiac Enzymes: No results for input(s): CKTOTAL, CKMB, CKMBINDEX, TROPONINI in the last 168 hours.  BNP: BNP (last 3 results) Recent Labs    09/06/19 2211  BNP 311.8*    ProBNP (last 3 results) No results for input(s): PROBNP in the last 8760 hours.   CBG: Recent Labs  Lab 09/24/19 2041 09/24/19 2136  GLUCAP 132* 124*    Coagulation Studies: Recent Labs    09/19/2019 2252  LABPROT 31.9*  INR 3.1*     Imaging   CT Head Wo Contrast  Result Date: 09/24/2019 CLINICAL DATA:  Head trauma EXAM: CT HEAD WITHOUT CONTRAST TECHNIQUE: Contiguous axial images were obtained from the base of the skull through the vertex without intravenous contrast. COMPARISON:  September 06, 2019 FINDINGS: Brain: No evidence of acute territorial infarction, hemorrhage, hydrocephalus,extra-axial collection or mass lesion/mass effect. There is dilatation the ventricles and sulci consistent with age-related atrophy. Low-attenuation changes in the deep white matter consistent with small vessel ischemia. Vascular: No hyperdense vessel or unexpected calcification. Skull: The skull is intact. No fracture or focal lesion identified. Sinuses/Orbits: The visualized paranasal sinuses and mastoid air cells are clear. The orbits and globes intact. Other: None IMPRESSION: No acute intracranial abnormality. Findings consistent with age related atrophy and chronic small vessel ischemia Electronically Signed   By: Prudencio Pair M.D.   On: 09/24/2019 00:04   CT CHEST W CONTRAST  Result Date: 09/24/2019 CLINICAL DATA:  Evaluate interstitial lung disease. EXAM: CT CHEST WITH CONTRAST TECHNIQUE: Multidetector CT imaging of the chest was performed during intravenous contrast administration. CONTRAST:  48mL OMNIPAQUE IOHEXOL 300 MG/ML  SOLN COMPARISON:  01/25/2007 chest CT. 09/24/2019 chest radiograph. FINDINGS: Cardiovascular: Mild cardiomegaly. No  significant pericardial effusion/thickening. Left anterior descending and left circumflex coronary atherosclerosis. Atherosclerotic nonaneurysmal thoracic aorta. Normal caliber main pulmonary artery. No central pulmonary emboli. Mediastinum/Nodes: No discrete thyroid nodules. Unremarkable esophagus. No pathologically enlarged axillary, mediastinal or hilar lymph nodes. Lungs/Pleura: No pneumothorax. No pleural effusion. No acute consolidative airspace disease, lung masses or significant pulmonary nodules. Coarsely calcified basilar right lower lobe granuloma is unchanged. There is moderate patchy confluent subpleural reticulation and ground-glass opacity throughout both lungs with a basilar predominance. There is associated mild traction bronchiolectasis and architectural distortion. Mild centrilobular and paraseptal emphysema. Scattered regions of mild honeycombing in the lower lobes bilaterally (series 4/image 105 on the right and image 139 on the left). These findings have progressed since 2008 chest CT. Upper abdomen: Small hiatal hernia. Cholelithiasis. Contrast reflux into the IVC and hepatic veins. Simple exophytic 4.4 cm lower right renal cyst. Musculoskeletal: No aggressive appearing focal osseous lesions. Mild thoracic spondylosis. IMPRESSION: 1. Spectrum of findings compatible with basilar predominant fibrotic interstitial lung disease with mild honeycombing, progressed since 2008 chest CT, considered diagnostic of usual interstitial pneumonia (UIP). Findings are consistent with UIP per consensus guidelines: Diagnosis of Idiopathic Pulmonary Fibrosis: An Official ATS/ERS/JRS/ALAT Clinical Practice Guideline. [place], Iss 5, [phone], Feb 27 2017. 2. Mild cardiomegaly. Contrast reflux into the IVC and hepatic veins, suggesting elevated right heart pressures. 3. Two-vessel coronary atherosclerosis. 4. Small hiatal hernia. 5. Cholelithiasis. 6. Aortic Atherosclerosis (ICD10-I70.0)  and Emphysema (ICD10-J43.9).  Electronically Signed   By: Ilona Sorrel M.D.   On: 09/24/2019 12:07   DG Chest Port 1 View  Result Date: 09/24/2019 CLINICAL DATA:  Change in mental status EXAM: PORTABLE CHEST 1 VIEW COMPARISON:  May 28, 2015 FINDINGS: The heart size and mediastinal contours are unchanged with mild cardiomegaly. Again noted are coarsened interstitial markings throughout both lungs with biapical scarring. Aortic knob calcifications. No acute osseous abnormality. IMPRESSION: Cardiomegaly Slight interval worsening in the coarsened interstitial markings seen throughout both lungs which may be due to worsening in chronic lung disease, however can not exclude concomitant infectious etiology. Electronically Signed   By: Prudencio Pair M.D.   On: 09/24/2019 00:52      Medications:     Current Medications: . Chlorhexidine Gluconate Cloth  6 each Topical Daily  . diltiazem  120 mg Oral Daily  . donepezil  10 mg Oral QHS  . erythromycin  1 application Left Eye QID  . fentaNYL      . levothyroxine  75 mcg Oral Q0600  . midazolam      . pantoprazole  40 mg Oral Daily  . Rivaroxaban  15 mg Oral Q supper  . tamsulosin  0.4 mg Oral Daily     Infusions: . sodium chloride 50 mL/hr at 09/24/19 1709  . cefTRIAXone (ROCEPHIN)  IV Stopped (09/24/19 0526)       Assessment/Plan   1. Cardiac arrest The patient had a VT arrest earlier today.  ROSC was achieved after he was defibrillated with 120 J x 1.  The post resuscitation EKG revealed atrial fibrillation with a prolonged QT of 537 ms.  There are also some transient ST changes in the precordial leads.  At baseline he has Q waves in the inferior and anterior leads.  The patient is now intubated.  -Maintain serum potassium > 4.0 and serum magnesium >2.0 .We will hold off on IV amiodarone for now.  If the patient has recurrent ventricular arrhythmias then will likely start IV infusion. -Discontinue Lexapro due to concerns regarding QT  prolongation -Continue to treat for underlying infection  2.  Diastolic heart failure The patient is euvolemic on clinical examination.  -Daily weights -Strict I and Os -Hold diuretics  3.  Coronary atherosclerosis Historically there is a documentation of nonobstructive coronary artery disease on a cardiac catheterization.  The CT of the chest with contrast on this admission suggested atherosclerotic disease involving the LAD and left circumflex arteries.  The electrocardiograms on this admission did have some transient ST depressions in the anterior leads (with ST elevation in aVR).  High-sensitivity troponin is 10,382 and 9437.  -Continue to trend cardiac biomarkers -Serial ECGs  The patient will require an ischemic work-up at some point.   4.  Atrial fibrillation On telemetry in the ICU the patient is noted to have normally conducted sinus beats with very frequent PACs.  In the past he has been on anticoagulation with Xarelto.  -Continue anticoagulation if deemed to be safe.     Meade Maw, MD  09/24/2019, 9:42 PM  Cardiology Overnight Team Please contact [facility] for night-coverage after hours (4p -7a ) and weekends on [email or web address]

## 2019-09-24 NOTE — Progress Notes (Signed)
ABG drawn and sent to the lab @ 2050

## 2019-09-24 NOTE — ED Notes (Signed)
Assumed care of pt. Pt alert, resting on cart in NAD. Breathing easy, non-labored.  Call made to lab - reporting bloodwork has not been received

## 2019-09-24 NOTE — ED Notes (Signed)
covid swab collected, labeled with 2 pt identifiers, and brought to lab 

## 2019-09-24 NOTE — ED Notes (Signed)
Tele   Breakfast ordered  

## 2019-09-24 NOTE — ED Notes (Addendum)
Pt received breakfast tray 

## 2019-09-24 NOTE — ED Notes (Signed)
Synthroid given per MAR. Name/DOB verified with pt. Pt incontinent of urine. Turned, cleaned, and repositioned. Denies any other needs at this time. Call light within reach. Bed locked, in lowest position with side rails up x2

## 2019-09-24 NOTE — Progress Notes (Signed)
eLink Physician-Brief Progress Note Patient Name: Cameron [name] DOB: [birth year] MRN: [id number]   Date of Service  09/24/2019  HPI/Events of Note  86 year old man with CAD, A fib on AC, admitted for weakness, hyponatremia, and had a VT arrest. Transferred to ICU for further care.  eICU Interventions  PCCM and cardiology consulted and managing Please call E link if needed     Intervention Category Major Interventions: Arrhythmia - evaluation and management;Respiratory failure - evaluation and management Evaluation Type: New Patient Evaluation  Margaretmary Lombard 09/24/2019, 11:29 PM

## 2019-09-24 NOTE — Progress Notes (Addendum)
Patient placed in observation after midnight but care began prior to midnight.  Please see H&P.  Patient resents from a short term skilled nursing facility ([facility]) with several days of progressively worsening lethargy/confusion and a fall.  Patient does appear more frail than prior hospitalization but is still interactive.  When I asked him what brought him back to the hospital he said "evil spirits"  several laboratory abnormalities have been found including: 1. elevated white blood cell count with suspicion for UTI: Treat with IV antibiotics and monitor closely 2.  Elevated liver enzyme (AST of 175, ALT of 37) 3.  INR of 3: Patient on Xarelto so not sure this finding is significant  Will change to inpatient as he continues to be lethargic when I saw patient this a.m. around breakfast he ate 0% of his meals and he needs continued IV antibiotics until taking adequate p.o.  Left message with son [name] with an update.  Olene Floss

## 2019-09-24 NOTE — ED Notes (Signed)
Pt moved to inpatient hospital bed. No needs from pt at this time.

## 2019-09-24 NOTE — ED Notes (Signed)
Care endorsed to Kelly, RN 

## 2019-09-24 NOTE — ED Notes (Addendum)
meds given per MAR. Name/DOB verified with pt 

## 2019-09-24 NOTE — Code Documentation (Addendum)
86 yo male code blue event Upon arrival, CPR in progress, pt not on any telemetry. Reportedly staff stated pt was in sustained pulseless VT. Zoll defibrillator pads placed and defibrillator turned on during pulse check performed. HR 90s and pt had weak thready carotid pulse. He was agonally breathing 8-10 bpm. Pt was intubated by RT. ETCO2 and bilateral BS confirmed. Pt went into a wide complex tachycardia and defibrillated at 120J successfully. Returned to afib 80-90 after defibrillation. Only medications given were Fentanyl 79mcg and Versed 2mg  post intubation. Pt was purposeful in movements following ROSC.  BP 122/76, HR 94 afib prior to transport to 2H. Primary svc, IMTS and family at bedside. Total CPR time approximately 2-3 mins.

## 2019-09-24 NOTE — ED Notes (Addendum)
MD Noemi Chapel about elevated lactate 2.3

## 2019-09-24 NOTE — Progress Notes (Signed)
Chaplain engaged in initial visit with Cameron [name] son and wife.  Chaplain offered support and prayer with son.  Son noted that last night had been the first time he had been able to hug his dad since the beginning of the pandemic.  Son noted how hard it is to see him in this position after his dad has been able to survive the pandemic, being in an assisted living home without any physical contact from family, and after being vaccinated.  Chaplain offered the ministry of presence, prayer and listening.    Chaplain is available for follow-up as needed.

## 2019-09-25 ENCOUNTER — Inpatient Hospital Stay (HOSPITAL_COMMUNITY): Payer: PPO

## 2019-09-25 DIAGNOSIS — I469 Cardiac arrest, cause unspecified: Secondary | ICD-10-CM | POA: Diagnosis not present

## 2019-09-25 DIAGNOSIS — I2510 Atherosclerotic heart disease of native coronary artery without angina pectoris: Secondary | ICD-10-CM | POA: Diagnosis not present

## 2019-09-25 DIAGNOSIS — F0390 Unspecified dementia without behavioral disturbance: Secondary | ICD-10-CM

## 2019-09-25 DIAGNOSIS — I472 Ventricular tachycardia: Secondary | ICD-10-CM

## 2019-09-25 DIAGNOSIS — I350 Nonrheumatic aortic (valve) stenosis: Secondary | ICD-10-CM | POA: Diagnosis not present

## 2019-09-25 DIAGNOSIS — I4819 Other persistent atrial fibrillation: Secondary | ICD-10-CM

## 2019-09-25 DIAGNOSIS — I4891 Unspecified atrial fibrillation: Secondary | ICD-10-CM

## 2019-09-25 DIAGNOSIS — I340 Nonrheumatic mitral (valve) insufficiency: Secondary | ICD-10-CM | POA: Diagnosis not present

## 2019-09-25 LAB — CBC WITH DIFFERENTIAL/PLATELET
Abs Immature Granulocytes: 0.06 10*3/uL (ref 0.00–0.07)
Basophils Absolute: 0 10*3/uL (ref 0.0–0.1)
Basophils Relative: 0 %
Eosinophils Absolute: 0 10*3/uL (ref 0.0–0.5)
Eosinophils Relative: 0 %
HCT: 39.7 % (ref 39.0–52.0)
Hemoglobin: 13.3 g/dL (ref 13.0–17.0)
Immature Granulocytes: 0 %
Lymphocytes Relative: 9 %
Lymphs Abs: 1.2 10*3/uL (ref 0.7–4.0)
MCH: 33.8 pg (ref 26.0–34.0)
MCHC: 33.5 g/dL (ref 30.0–36.0)
MCV: 101 fL — ABNORMAL HIGH (ref 80.0–100.0)
Monocytes Absolute: 1.2 10*3/uL — ABNORMAL HIGH (ref 0.1–1.0)
Monocytes Relative: 9 %
Neutro Abs: 11.1 10*3/uL — ABNORMAL HIGH (ref 1.7–7.7)
Neutrophils Relative %: 82 %
Platelets: 217 10*3/uL (ref 150–400)
RBC: 3.93 MIL/uL — ABNORMAL LOW (ref 4.22–5.81)
RDW: 13.2 % (ref 11.5–15.5)
WBC: 13.6 10*3/uL — ABNORMAL HIGH (ref 4.0–10.5)
nRBC: 0 % (ref 0.0–0.2)

## 2019-09-25 LAB — BLOOD GAS, VENOUS
Acid-base deficit: 3.9 mmol/L — ABNORMAL HIGH (ref 0.0–2.0)
Bicarbonate: 20.7 mmol/L (ref 20.0–28.0)
Drawn by: 164
FIO2: 40
O2 Saturation: 74.3 %
Patient temperature: 36.5
pCO2, Ven: 37.9 mmHg — ABNORMAL LOW (ref 44.0–60.0)
pH, Ven: 7.356 (ref 7.250–7.430)
pO2, Ven: 46.9 mmHg — ABNORMAL HIGH (ref 32.0–45.0)

## 2019-09-25 LAB — MAGNESIUM: Magnesium: 2.4 mg/dL (ref 1.7–2.4)

## 2019-09-25 LAB — COMPREHENSIVE METABOLIC PANEL
ALT: 68 U/L — ABNORMAL HIGH (ref 0–44)
AST: 93 U/L — ABNORMAL HIGH (ref 15–41)
Albumin: 2.6 g/dL — ABNORMAL LOW (ref 3.5–5.0)
Alkaline Phosphatase: 49 U/L (ref 38–126)
Anion gap: 12 (ref 5–15)
BUN: 20 mg/dL (ref 8–23)
CO2: 21 mmol/L — ABNORMAL LOW (ref 22–32)
Calcium: 8.6 mg/dL — ABNORMAL LOW (ref 8.9–10.3)
Chloride: 102 mmol/L (ref 98–111)
Creatinine, Ser: 1.11 mg/dL (ref 0.61–1.24)
GFR calc Af Amer: >60 mL/min (ref >60)
GFR calc non Af Amer: 58 mL/min — ABNORMAL LOW (ref >60)
Glucose, Bld: 124 mg/dL — ABNORMAL HIGH (ref 70–99)
Potassium: 4.6 mmol/L (ref 3.5–5.1)
Sodium: 135 mmol/L (ref 135–145)
Total Bilirubin: 0.9 mg/dL (ref 0.3–1.2)
Total Protein: 6.5 g/dL (ref 6.5–8.1)

## 2019-09-25 LAB — TROPONIN I (HIGH SENSITIVITY): Troponin I (High Sensitivity): 9437 ng/L (ref <18)

## 2019-09-25 LAB — APTT: aPTT: 28 seconds (ref 24–36)

## 2019-09-25 LAB — MRSA PCR SCREENING: MRSA by PCR: NEGATIVE

## 2019-09-25 LAB — ECHOCARDIOGRAM COMPLETE
Height: 70.5 in
Weight: 2405.66 oz

## 2019-09-25 LAB — LACTIC ACID, PLASMA: Lactic Acid, Venous: 2.6 mmol/L (ref 0.5–1.9)

## 2019-09-25 LAB — HEPARIN LEVEL (UNFRACTIONATED): Heparin Unfractionated: >2.2 IU/mL — ABNORMAL HIGH (ref 0.30–0.70)

## 2019-09-25 LAB — PHOSPHORUS: Phosphorus: 3.7 mg/dL (ref 2.5–4.6)

## 2019-09-25 MED ORDER — POTASSIUM CHLORIDE 20 MEQ/15ML (10%) PO SOLN
20.0000 meq | Freq: Once | ORAL | Status: AC
  Administered 2019-09-25: 20 meq via ORAL
  Filled 2019-09-25: qty 15

## 2019-09-25 MED ORDER — HEPARIN (PORCINE) 25000 UT/250ML-% IV SOLN
1150.0000 [IU]/h | INTRAVENOUS | Status: AC
  Administered 2019-09-25: 850 [IU]/h via INTRAVENOUS
  Administered 2019-09-26: 950 [IU]/h via INTRAVENOUS
  Filled 2019-09-25 (×2): qty 250

## 2019-09-25 MED ORDER — POTASSIUM CHLORIDE 20 MEQ PO PACK: 40.0000 meq | PACK | Freq: Once | ORAL | Status: DC

## 2019-09-25 MED ORDER — DILTIAZEM HCL 60 MG PO TABS
30.0000 mg | ORAL_TABLET | Freq: Four times a day (QID) | ORAL | Status: DC
  Administered 2019-09-25 – 2019-09-26 (×5): 30 mg
  Filled 2019-09-25 (×6): qty 1

## 2019-09-25 MED ORDER — POTASSIUM CHLORIDE 10 MEQ/100ML IV SOLN
10.0000 meq | INTRAVENOUS | Status: AC
  Administered 2019-09-25 (×2): 10 meq via INTRAVENOUS
  Filled 2019-09-25 (×2): qty 100

## 2019-09-25 MED ORDER — POTASSIUM CHLORIDE 20 MEQ/15ML (10%) PO SOLN: 40.0000 meq | Freq: Every day | ORAL | Status: DC

## 2019-09-25 NOTE — Progress Notes (Signed)
Progress Note  Patient Name: Cameron [name] Date of Encounter: 09/25/2019  Primary Cardiologist: New    Subjective   Pt intubated   Sedated     Inpatient Medications    Scheduled Meds: . chlorhexidine gluconate (MEDLINE KIT)  15 mL Mouth Rinse BID  . Chlorhexidine Gluconate Cloth  6 each Topical Daily  . diltiazem  30 mg Per Tube Q6H  . donepezil  10 mg Per Tube QHS  . erythromycin  1 application Left Eye QID  . levothyroxine  75 mcg Per Tube Q0600  . mouth rinse  15 mL Mouth Rinse 10 times per day  . pantoprazole sodium  40 mg Per Tube Daily  . tamsulosin  0.4 mg Oral Daily   Continuous Infusions: . cefTRIAXone (ROCEPHIN)  IV 1 g (09/25/19 0424)  . fentaNYL infusion INTRAVENOUS 25 mcg/hr (09/25/19 0424)  . heparin     PRN Meds: acetaminophen, albuterol, fentaNYL, polyethylene glycol   Vital Signs    Vitals:   09/25/19 0615 09/25/19 0700 09/25/19 0738 09/25/19 0752  BP: 90/61 (!) 100/50 (!) 98/58   Pulse:  62 65   Resp:  15 15   Temp:    97.7 F (36.5 C)  TempSrc:      SpO2:  100% 100%   Weight:   68.2 kg   Height:   5' 10.5" (1.791 m)     Intake/Output Summary (Last 24 hours) at 09/25/2019 0801 Last data filed at 09/25/2019 0600 Gross per 24 hour  Intake 1675.59 ml  Output 160 ml  Net 1515.59 ml   Last 3 Weights 09/25/2019 09/25/2019 09/14/2019  Weight (lbs) 150 lb 5.7 oz 150 lb 5.7 oz 153 lb 3.5 oz  Weight (kg) 68.2 kg 68.2 kg 69.5 kg      Telemetry     SR  72   QTc 500 msec - Personally Reviewed  ECG      Physical Exam   GEN: No acute distress.   Neck: No JVD Cardiac: RRR, no murmurs, rubs, or gallops.  Respiratory: Clear to auscultation bilaterally. GI: Soft, nontender, non-distended  MS: No edema; No deformity. Neuro:  Nonfocal  Psych: Normal affect   Labs    High Sensitivity Troponin:   Recent Labs  Lab 09/24/19 2103 09/24/19 2346  TROPONINIHS 10,382* 9,437*      Chemistry Recent Labs  Lab 09/21/2019 2252 09/12/2019 2252  09/24/19 2103 09/24/19 2138 09/25/19 0435  NA 131*   < > 133* 135 135  K 4.0   < > 3.7 3.5 4.6  CL 98  --  103  --  102  CO2 20*  --  18*  --  21*  GLUCOSE 141*  --  130*  --  124*  BUN 18  --  19  --  20  CREATININE 1.21  --  1.26*  --  1.11  CALCIUM 8.8*  --  8.2*  --  8.6*  PROT 6.6  --  5.8*  --  6.5  ALBUMIN 2.7*  --  2.3*  --  2.6*  AST 175*  --  123*  --  93*  ALT 37  --  74*  --  68*  ALKPHOS 46  --  47  --  49  BILITOT 0.9  --  1.2  --  0.9  GFRNONAA 52*  --  50*  --  58*  GFRAA >60  --  57*  --  >60  ANIONGAP 13  --  12  --  12   < > = values in this interval not displayed.     Hematology Recent Labs  Lab 09/12/2019 2252 09/24/2019 2252 09/24/19 2103 09/24/19 2138 09/25/19 0435  WBC 17.0*  --  14.0*  --  13.6*  RBC 3.80*  --  3.68*  --  3.93*  HGB 13.0   < > 12.6* 11.6* 13.3  HCT 37.3*   < > 37.7* 34.0* 39.7  MCV 98.2  --  102.4*  --  101.0*  MCH 34.2*  --  34.2*  --  33.8  MCHC 34.9  --  33.4  --  33.5  RDW 13.1  --  13.2  --  13.2  PLT 222  --  236  --  217   < > = values in this interval not displayed.    BNPNo results for input(s): BNP, PROBNP in the last 168 hours.   DDimer No results for input(s): DDIMER in the last 168 hours.   Radiology    DG Abd 1 View  Result Date: 09/24/2019 CLINICAL DATA:  Enteric catheter placement EXAM: ABDOMEN - 1 VIEW COMPARISON:  09/24/2019 FINDINGS: Frontal view of the lower chest and upper abdomen demonstrates enteric catheter tip projecting over gastric fundus. Side port projects at the gastroesophageal junction. Lung bases are clear. External defibrillator pads are noted. IMPRESSION: 1. Enteric catheter tip projecting over gastric fundus. Electronically Signed   By: Randa Ngo M.D.   On: 09/24/2019 23:48   CT Head Wo Contrast  Result Date: 09/24/2019 CLINICAL DATA:  Head trauma EXAM: CT HEAD WITHOUT CONTRAST TECHNIQUE: Contiguous axial images were obtained from the base of the skull through the vertex without  intravenous contrast. COMPARISON:  September 06, 2019 FINDINGS: Brain: No evidence of acute territorial infarction, hemorrhage, hydrocephalus,extra-axial collection or mass lesion/mass effect. There is dilatation the ventricles and sulci consistent with age-related atrophy. Low-attenuation changes in the deep white matter consistent with small vessel ischemia. Vascular: No hyperdense vessel or unexpected calcification. Skull: The skull is intact. No fracture or focal lesion identified. Sinuses/Orbits: The visualized paranasal sinuses and mastoid air cells are clear. The orbits and globes intact. Other: None IMPRESSION: No acute intracranial abnormality. Findings consistent with age related atrophy and chronic small vessel ischemia Electronically Signed   By: Prudencio Pair M.D.   On: 09/24/2019 00:04   CT CHEST W CONTRAST  Result Date: 09/24/2019 CLINICAL DATA:  Evaluate interstitial lung disease. EXAM: CT CHEST WITH CONTRAST TECHNIQUE: Multidetector CT imaging of the chest was performed during intravenous contrast administration. CONTRAST:  1m OMNIPAQUE IOHEXOL 300 MG/ML  SOLN COMPARISON:  01/25/2007 chest CT. 09/24/2019 chest radiograph. FINDINGS: Cardiovascular: Mild cardiomegaly. No significant pericardial effusion/thickening. Left anterior descending and left circumflex coronary atherosclerosis. Atherosclerotic nonaneurysmal thoracic aorta. Normal caliber main pulmonary artery. No central pulmonary emboli. Mediastinum/Nodes: No discrete thyroid nodules. Unremarkable esophagus. No pathologically enlarged axillary, mediastinal or hilar lymph nodes. Lungs/Pleura: No pneumothorax. No pleural effusion. No acute consolidative airspace disease, lung masses or significant pulmonary nodules. Coarsely calcified basilar right lower lobe granuloma is unchanged. There is moderate patchy confluent subpleural reticulation and ground-glass opacity throughout both lungs with a basilar predominance. There is associated mild  traction bronchiolectasis and architectural distortion. Mild centrilobular and paraseptal emphysema. Scattered regions of mild honeycombing in the lower lobes bilaterally (series 4/image 105 on the right and image 139 on the left). These findings have progressed since 2008 chest CT. Upper abdomen: Small hiatal hernia. Cholelithiasis. Contrast reflux into the IVC and hepatic veins. Simple exophytic  4.4 cm lower right renal cyst. Musculoskeletal: No aggressive appearing focal osseous lesions. Mild thoracic spondylosis. IMPRESSION: 1. Spectrum of findings compatible with basilar predominant fibrotic interstitial lung disease with mild honeycombing, progressed since 2008 chest CT, considered diagnostic of usual interstitial pneumonia (UIP). Findings are consistent with UIP per consensus guidelines: Diagnosis of Idiopathic Pulmonary Fibrosis: An Official ATS/ERS/JRS/ALAT Clinical Practice Guideline. [place], Iss 5, [id number], Feb 27 2017. 2. Mild cardiomegaly. Contrast reflux into the IVC and hepatic veins, suggesting elevated right heart pressures. 3. Two-vessel coronary atherosclerosis. 4. Small hiatal hernia. 5. Cholelithiasis. 6. Aortic Atherosclerosis (ICD10-I70.0) and Emphysema (ICD10-J43.9). Electronically Signed   By: Ilona Sorrel M.D.   On: 09/24/2019 12:07   DG Chest Portable 1 View  Addendum Date: 09/24/2019   ADDENDUM REPORT: 09/24/2019 23:19 ADDENDUM: There is an enteric catheter passing below the diaphragm, tip excluded by collimation. Side port projects just above the gastroesophageal junction. Findings were discussed with the patient's nurse at 11:12 p.m. Electronically Signed   By: Randa Ngo M.D.   On: 09/24/2019 23:19   Result Date: 09/24/2019 CLINICAL DATA:  Code blue, intubated EXAM: PORTABLE CHEST 1 VIEW COMPARISON:  09/24/2019 FINDINGS: Single frontal view of the chest demonstrates external defibrillator pads. Endotracheal tube overlies tracheal air column tip  midway between thoracic inlet and carina. Cardiac silhouette is stable. Stable areas of scarring and fibrosis throughout the lungs. No airspace disease, effusion, or pneumothorax. No acute bony abnormalities. IMPRESSION: 1. No complication after intubation. 2. Pulmonary scarring and fibrosis. No superimposed airspace disease. Electronically Signed: By: Randa Ngo M.D. On: 09/24/2019 22:17   DG Chest Port 1 View  Result Date: 09/24/2019 CLINICAL DATA:  Change in mental status EXAM: PORTABLE CHEST 1 VIEW COMPARISON:  May 28, 2015 FINDINGS: The heart size and mediastinal contours are unchanged with mild cardiomegaly. Again noted are coarsened interstitial markings throughout both lungs with biapical scarring. Aortic knob calcifications. No acute osseous abnormality. IMPRESSION: Cardiomegaly Slight interval worsening in the coarsened interstitial markings seen throughout both lungs which may be due to worsening in chronic lung disease, however can not exclude concomitant infectious etiology. Electronically Signed   By: Prudencio Pair M.D.   On: 09/24/2019 00:52    Cardiac Studies    Patient Profile     86 y.o. male   Assessment & Plan    1 Rhythm   PT with polymorphic VT arrest   QT prolonged probably from recent addition of Lexapro.   Today is improved  Approximatley 500    Follow rhythm  Avoid drugs that prolong QT  2   CAD   Troponin with signif bump  10,000  At 21:00 last night   This is 30 min after cardiac arrest   This goes along that ischemia/injury was happending long before the arrhythmic event    Predisposed to event    Trop are now declining Echo in 2014 showed EF was 30 to 35%   On discussion with Dr Radford Pax the pt had another echo that showed normalization of the LVEF   (felt tachy induced.   Was probably done as outpt) REview of EKG with subtle ST changes , not diagnostic CT with evid of CAD Echo has been ordered   WIll review Depending on how he recovers /extubated will  need to decide on further eval  2  Atrial fibrillation  Pt had chronic atrial fibrillation   Converted to SR after  Defibrillation last night  He had been on Xarelto chronically and is now on heparin   Continue tele.    5 ID  On admit WBC 17 K   Now 14.  The pt had been treated for cellulits of face recently    Now on Ceftriaxone    Continue for now   Follow exam  6  Pulmonary   Pt intubated   SOme bloody secretions in tube sugg traumatic.  Lungs with rhonchi  Hopefully no aspriation    CT as noted above sugg chronic process   FOllow   CCM to see pt      7  Neuro   Per pt's daughter in law he was engaging normally for age couple weeks ago  Only recently confused     Had been treated for cellulits of face    Started on lexapro recently as outpt for possible depression   This has been stopped   WIll follow as awakens   Review with family    For questions or updates, please contact [facility] Please consult [email or web address] for contact info under        Signed, Dorris Carnes, MD  09/25/2019, 8:01 AM

## 2019-09-25 NOTE — Progress Notes (Signed)
Nursing Progress note  Brief History:  86 yo male with HF, COPD, Afib, CAD and recently discharged from hospital with facial cellulitis and hyponatremia. New admission due to AMS as result of a UTI. Vtach arrest on the floor, intubated and transferred to ICU.     Intake/Output Summary (Last 24 hours) at 09/25/2019 1832 Last data filed at 09/25/2019 1800 Gross per 24 hour  Intake 636.79 ml  Output 360 ml  Net 276.79 ml    . cefTRIAXone (ROCEPHIN)  IV 1 g (09/25/19 0424)  . fentaNYL infusion INTRAVENOUS Stopped (09/25/19 1201)  . heparin 850 Units/hr (09/25/19 1800)    Today's Vitals   09/25/19 1630 09/25/19 1700 09/25/19 1730 09/25/19 1800  BP: 102/64 (!) 100/57 (!) 104/58 (!) 99/56  Pulse: 60 60 (!) 57 60  Resp: 15 15 15 15   Temp:      TempSrc:      SpO2: 100% 100% 100% 99%  Weight:      Height:      PainSc:         Sodium  Date/Time Value Ref Range Status  09/25/2019 04:35 AM 135 135 - 145 mmol/L Final  09/24/2019 09:38 PM 135 135 - 145 mmol/L Final  03/14/2018 10:10 AM 139 134 - 144 mmol/L Final   Potassium  Date/Time Value Ref Range Status  09/25/2019 04:35 AM 4.6 3.5 - 5.1 mmol/L Final  09/24/2019 09:38 PM 3.5 3.5 - 5.1 mmol/L Final   Chloride  Date/Time Value Ref Range Status  09/25/2019 04:35 AM 102 98 - 111 mmol/L Final  09/24/2019 09:03 PM 103 98 - 111 mmol/L Final   CO2  Date/Time Value Ref Range Status  09/25/2019 04:35 AM 21 (L) 22 - 32 mmol/L Final  09/24/2019 09:03 PM 18 (L) 22 - 32 mmol/L Final   Glucose, Bld  Date/Time Value Ref Range Status  09/25/2019 04:35 AM 124 (H) 70 - 99 mg/dL Final    Comment:    Glucose reference range applies only to samples taken after fasting for at least 8 hours.  09/24/2019 09:03 PM 130 (H) 70 - 99 mg/dL Final    Comment:    Glucose reference range applies only to samples taken after fasting for at least 8 hours.   BUN  Date/Time Value Ref Range Status  09/25/2019 04:35 AM 20 8 - 23 mg/dL Final  09/24/2019  09:03 PM 19 8 - 23 mg/dL Final  03/14/2018 10:10 AM 13 8 - 27 mg/dL Final   Creatinine, Ser  Date/Time Value Ref Range Status  09/25/2019 04:35 AM 1.11 0.61 - 1.24 mg/dL Final  09/24/2019 09:03 PM 1.26 (H) 0.61 - 1.24 mg/dL Final     WBC  Date/Time Value Ref Range Status  09/25/2019 04:35 AM 13.6 (H) 4.0 - 10.5 K/uL Final  09/24/2019 09:03 PM 14.0 (H) 4.0 - 10.5 K/uL Final   Hemoglobin  Date/Time Value Ref Range Status  09/25/2019 04:35 AM 13.3 13.0 - 17.0 g/dL Final  09/24/2019 09:38 PM 11.6 (L) 13.0 - 17.0 g/dL Final  03/14/2018 10:10 AM 13.7 13.0 - 17.7 g/dL Final   Platelets  Date/Time Value Ref Range Status  09/25/2019 04:35 AM 217 150 - 400 K/uL Final  09/24/2019 09:03 PM 236 150 - 400 K/uL Final  03/14/2018 10:10 AM 243 150 - 450 x10E3/uL Final    ECHO 3/29:   IMPRESSIONS  1. Left ventricular ejection fraction, by estimation, is 35 to 40%. The  left ventricle has moderately decreased function. The left ventricle  demonstrates regional wall motion abnormalities (see scoring  diagram/findings for description).  Inferior/inferolateral akinesis. Left ventricular diastolic parameters are  indeterminate.  2. Right ventricular systolic function is mildly reduced. The right  ventricular size is mildly enlarged.  3. Right atrial size was severely dilated.  4. The mitral valve is normal in structure. Moderate mitral valve  regurgitation.  5. The tricuspid valve is abnormal. Tricuspid valve regurgitation is  moderate.  6. The aortic valve is tricuspid. Aortic valve regurgitation is mild to  moderate. Mild aortic valve sclerosis is present, with no evidence of  aortic valve stenosis.     Nursing Plan:  Patient tolerated night well after the arrest, requiring 25-50 mcg fentanyl infusion. Infusion weaned off and removed to allow for better alertness during SBTs. Patient did not tolerated breathing trials well as he became apneic at times and taking very shallow  breaths.  Vent continued through the day and decision made by CCM team to leave intubated overnight and attempt to extubate tomorrow pending trials. Patient following commands, calm, and cooperative with no sedation. Mag and potassium levels adequate with very infrequent ectopy on EKG. Sinus rhythm in the 60s. Heparin started this evening per orders.  Echo preformed with impression listed above. Foley placed last night, output minimal. UTI confirmed and cultured awaiting sensitivities.   Nutrition deferred until tomorrow pending extubation decision.   Family visiting in good spirits and aware of plan of care.

## 2019-09-25 NOTE — Progress Notes (Signed)
RT note: attempted SBT on patient this AM however patient's respiratory rate was 6 and minute ventilation was no higher than 4.0.  Placed patient back on full support ventilation.  Will attempt again when patient more awake.  Tolerating current settings well at this time.  Will continue to monitor.

## 2019-09-25 NOTE — Progress Notes (Addendum)
Initial Nutrition Assessment  DOCUMENTATION CODES:   Severe malnutrition in context of chronic illness  INTERVENTION:   If unable to extubate in the next 24-48 hours recommend:   Tube feeding:  -Vital AF 1.2 @ 20 ml/hr via OGT -Increase by 10 ml Q4 hours to goal rate of 55 ml/hr (1320 ml)  Provides: 1584 kcals, 99 grams protein, 1071 ml free water. Monitor magnesium, potassium, and phosphorus daily for at least 3 days, MD to replete as needed, as pt is at risk for refeeding syndrome.   NUTRITION DIAGNOSIS:   Severe Malnutrition related to chronic illness(COPD) as evidenced by severe fat depletion, severe muscle depletion, energy intake < or equal to 75% for > or equal to 1 month.  GOAL:   Patient will meet greater than or equal to 90% of their needs  MONITOR:   Diet advancement, Vent status, Skin, TF tolerance, Weight trends, Labs, I & O's  REASON FOR ASSESSMENT:   Consult Assessment of nutrition requirement/status  ASSESSMENT:   Patient with PMH significant for GERD, COPD, CHF, and CAD. Recently admitted 3/10-3/18 for facial cellulitis. Presents this admission with UTI.   3/28- vtach arrest, intubated   Pt discussed during ICU rounds and with RN.   Unable to extubate today, maybe tomorrow per RN. RD to leave recommendations if unable to extubate tomorrow.   Obtained history from family member at bedside. When pt moved into SNF a few weeks ago they noticed a progressive decline in PO intake. States he was served three meals daily but consumed only two. He often ate the meat option on trays. Was provided supplementation but unsure if he consumed them.   Patient is currently intubated on ventilator support MV: 8.4 L/min Temp (24hrs), Avg:98 F (36.7 C), Min:97.6 F (36.4 C), Max:98.7 F (37.1 C)   I/O: +2,480 ml since admit UOP: 160 ml x 24 hrs   Drips: fentanyl Labs: CBG 124-141  NUTRITION - FOCUSED PHYSICAL EXAM:    Most Recent Value  Orbital Region   Moderate depletion  Upper Arm Region  Severe depletion  Thoracic and Lumbar Region  Unable to assess  Buccal Region  Severe depletion  Temple Region  Severe depletion  Clavicle Bone Region  Severe depletion  Clavicle and Acromion Bone Region  Severe depletion  Scapular Bone Region  Unable to assess  Dorsal Hand  Unable to assess  Patellar Region  Severe depletion  Anterior Thigh Region  Severe depletion  Posterior Calf Region  Severe depletion  Edema (RD Assessment)  Mild  Hair  Reviewed  Eyes  Unable to assess  Mouth  Unable to assess  Skin  Reviewed  Nails  Unable to assess     Diet Order:   Diet Order            Diet NPO time specified  Diet effective now              EDUCATION NEEDS:   Not appropriate for education at this time  Skin:  Skin Assessment: Reviewed RN Assessment  Last BM:  PTA  Height:   Ht Readings from Last 1 Encounters:  09/25/19 5' 10.5" (1.791 m)    Weight:   Wt Readings from Last 1 Encounters:  09/25/19 68.2 kg    BMI:  Body mass index is 21.27 kg/m.  Estimated Nutritional Needs:   Kcal:  1475 kcal  Protein:  95-110 grams  Fluid:  >/= 1.4 L/day   Mariana Single RD, LDN Clinical Nutrition Pager listed  in [facility]

## 2019-09-25 NOTE — Consult Note (Addendum)
Cardiology Consultation:   Patient ID: Cameron [name] MRN: [id number]; DOB: 20-May-1926  Admit date: 08/31/2019 Date of Consult: 09/25/2019  Primary Care Provider: Lavone Orn, MD Primary Cardiologist: Dr. Irish Lack Primary Electrophysiologist:  None    Patient Profile:   Cameron [name] is a 86 y.o. male with a hx of COPD, GERD, Pulmonary fibrosis, hypothyroidism, chronic CHF (diastolic), historically NICM, permanent AFib who is being seen today for the evaluation of VT arrest at the request of Dr. Avon Gully.  History of Present Illness:   Mr. Jacome recently hospitalized for dental infection > facial cellulitis discharged back to SNF, completed his antibiotics though developed lethargy, confusion, sufered a fall Admitted yesterday early morning with metabolic encephalopathy, suspect dehydration, hyponatremia (suspected SIADH during his last hospitalization).  Initial management included gentle hydration, imaging and w/u into possible infectious process.  Suspect UTI and started on Abx  LABS NA 131 K+ 4.0 BUN/Creat 18/1.21 WBC 17.0 H/H 13/37 Plts 222  AST 175 ALT 37 PT 31.9INR 3.1 (on xarelto for a/c)   Last night he had cardiac arrest, reportedly pulseless VT > CPR > pulse, intubated > VT shocked x1 > AFib.  Total time to ROSC approx 45mn. Post code LABS NA 133 K+ 3/7 BUN/Creat 18/1.26 Mag 1.9 WBC 14.0 H/H 12/37 Plts 236  HS Trop 10,382 > 9,437  The patient is awake, intubated, following commands, denies CP    Past Medical History:  Diagnosis Date  . Arthritis   . CHF (congestive heart failure) ([facility]   . COPD [place] obstructive pulmonary disease) [place]   . Dysrhythmia    atrial fibrilation  . GERD (gastroesophageal reflux disease)   . Pulmonary fibrosis ([place]     Past Surgical History:  Procedure Laterality Date  . hemmroidectmy    . HERNIA REPAIR    . LEFT HEART CATHETERIZATION WITH CORONARY ANGIOGRAM N/A 10/07/2012   Procedure: LEFT HEART  CATHETERIZATION WITH CORONARY ANGIOGRAM, possible PCI;  Surgeon: JJettie Booze MD;  [facility];  Service: Cardiovascular;  Laterality: N/A;  . TONSILLECTOMY       Home Medications:  Prior to Admission medications   Medication Sig Start Date End Date Taking? Authorizing Provider  acetaminophen (TYLENOL) 325 MG tablet Take 650 mg by mouth every 6 (six) hours as needed.   Yes [provider]  albuterol (PROVENTIL HFA;VENTOLIN HFA) 108 (90 BASE) MCG/ACT inhaler Inhale 1-2 puffs into the lungs every 6 (six) hours as needed for wheezing or shortness of breath.   Yes [provider]  calcium-vitamin D (OSCAL WITH D) 500-200 MG-UNIT tablet Take 1 tablet by mouth daily.    Yes [provider]  diltiazem (CARDIZEM CD) 120 MG 24 hr capsule Take 120 mg by mouth daily.   Yes [provider]  donepezil (ARICEPT) 10 MG tablet Take 1 tablet (10 mg total) by mouth at bedtime. 10/09/12  Yes Norins, MHeinz Knuckles MD  erythromycin ophthalmic ointment Place 1 application into the left eye 4 (four) times daily. Apply to skin vesicles on eyelid and forehead 03/24/19  Yes ZBernarda Caffey MD  escitalopram (LEXAPRO) 10 MG tablet Take 5 mg by mouth daily.   Yes [provider]  fexofenadine (ALLEGRA) 180 MG tablet Take 180 mg by mouth daily as needed for allergies or rhinitis.   Yes [provider]  furosemide (LASIX) 20 MG tablet Take 20 mg by mouth 3 (three) times a week. Take one tablet by mouth every Monday, Wednesday and Friday.   Yes  [provider]  levothyroxine (SYNTHROID, LEVOTHROID) 75 MCG tablet ONE TABLET DAILY BEFORE BREAKFAST Patient taking differently: Take 75 mcg by mouth daily before breakfast.    Yes Norins, Heinz Knuckles, MD  loperamide (ANTI-DIARRHEAL) 2 MG capsule Take 2 mg by mouth as needed for diarrhea or loose stools.   Yes [provider]  omeprazole (PRILOSEC OTC) 20 MG tablet Take 20 mg by mouth daily.   Yes  [provider]  Rivaroxaban (XARELTO) 15 MG TABS tablet Take 1 tablet (15 mg total) by mouth daily with supper. 08/23/13  Yes Jettie Booze, MD  solifenacin (VESICARE) 10 MG tablet Take 10 mg by mouth every morning.    Yes [provider]  tamsulosin (FLOMAX) 0.4 MG CAPS capsule Take 1 capsule daily by mouth. 04/15/17  Yes [provider]  vitamin B-12 (CYANOCOBALAMIN) 500 MCG tablet Take 500 mcg by mouth 2 (two) times a week. Mon and Thurs   Yes [provider]  PRESCRIPTION MEDICATION 10 mLs by Mouth Rinse route in the morning and at bedtime. CLOSYS Rinse 0.25% ; use after brushing teeth every morning, and evening. Do not swallow.    [provider]    Inpatient Medications: Scheduled Meds: . chlorhexidine gluconate (MEDLINE KIT)  15 mL Mouth Rinse BID  . Chlorhexidine Gluconate Cloth  6 each Topical Daily  . diltiazem  30 mg Per Tube Q6H  . donepezil  10 mg Per Tube QHS  . erythromycin  1 application Left Eye QID  . levothyroxine  75 mcg Per Tube Q0600  . mouth rinse  15 mL Mouth Rinse 10 times per day  . pantoprazole sodium  40 mg Per Tube Daily  . tamsulosin  0.4 mg Oral Daily   Continuous Infusions: . cefTRIAXone (ROCEPHIN)  IV 1 g (09/25/19 0424)  . fentaNYL infusion INTRAVENOUS 25 mcg/hr (09/25/19 0424)  . heparin     PRN Meds: acetaminophen, albuterol, fentaNYL, polyethylene glycol  Allergies:   No Known Allergies  Social History:   Social History   Socioeconomic History  . Marital status: Widowed    Spouse name: Not on file  . Number of children: 4  . Years of education: Not on file  . Highest education level: Bachelor's degree (e.g., BA, AB, BS)  Occupational History  . Not on file  Tobacco Use  . Smoking status: Former Smoker    Packs/day: 2.00    Years: 40.00    Pack years: 80.00    Types: Cigarettes    Quit date: 06/29/1961    Years since quitting: 58.2  . Smokeless tobacco: Never Used  Substance and  Sexual Activity  . Alcohol use: Yes    Alcohol/week: 2.0 standard drinks    Types: 2 Glasses of wine per week    Comment: occasional  . Drug use: No  . Sexual activity: Never  Other Topics Concern  . Not on file  Social History Narrative   Pt lives at [facility], pt is a widower, he has 4 children   Right handed, BS degree   Pt drinks coffee, tea, and soda when he can   He does not exercise regularly   Sometime walk      Social Determinants of Health   Financial Resource Strain:   . Difficulty of Paying Living Expenses:   Food Insecurity:   . Worried About Charity fundraiser in the Last Year:   . Arboriculturist in the Last Year:   [employer]  Needs:   . Lack of Transportation (Medical):   Marland Kitchen Lack of Transportation (Non-Medical):   Physical Activity:   . Days of Exercise per Week:   . Minutes of Exercise per Session:   Stress:   . Feeling of Stress :   Social Connections:   . Frequency of Communication with Friends and Family:   . Frequency of Social Gatherings with Friends and Family:   . Attends Religious Services:   . Active Member of Clubs or Organizations:   . Attends Archivist Meetings:   Marland Kitchen Marital Status:   Intimate Partner Violence:   . Fear of Current or Ex-Partner:   . Emotionally Abused:   Marland Kitchen Physically Abused:   . Sexually Abused:     Family History:   Family History  Problem Relation Age of Onset  . Heart attack Mother   . Pulmonary embolism Father      ROS:  Please see the history of present illness.  All other ROS reviewed and negative.     Physical Exam/Data:   Vitals:   09/25/19 0200 09/25/19 0400 09/25/19 0415 09/25/19 0615  BP: 106/70 (!) 98/54 102/66 90/61  Pulse:  (!) 57 64   Resp: 15 15 15    Temp:  97.6 F (36.4 C)    TempSrc:  Axillary    SpO2:  100% 100%     Intake/Output Summary (Last 24 hours) at 09/25/2019 0644 Last data filed at 09/25/2019 0600 Gross per 24 hour  Intake 1675.59 ml  Output 160 ml  Net  1515.59 ml   Last 3 Weights 09/14/2019 09/13/2019 09/12/2019  Weight (lbs) 153 lb 3.5 oz 155 lb 10.3 oz 155 lb 6.8 oz  Weight (kg) 69.5 kg 70.6 kg 70.5 kg     There is no height or weight on file to calculate BMI.  General:  Well nourished, well developed, elderly, intubated, wakes to verbal HEENT: normal Lymph: no adenopathy Neck: no JVD Endocrine:  Not examined Vascular: No carotid bruits Cardiac: RRR; 1/6 SM, no gallops or rubs Lungs:  Intubated Very soft scattred rhonchi, no wheezing, no wheezing, rhonchi or rales  Abd: soft, nontender Ext: no edema, chronic looking skin changes Musculoskeletal:  No deformities, age appropriate atrophy Skin: warm and dry  Neuro:  Wakes easily to verbal, follows commands appropriately Psych:  Unable to assess  EKG:  The EKG was personally reviewed and demonstrates:    #1 AFib , icRBBB, , Q II, III, aVf, nonspeific St/T changes, no significant changes from older #2 SR , PACs, nonspecific St/T changes, QT prolonged #3 is SR QT is better, no ischemic changes appreciated   Telemetry:  Telemetry was personally reviewed and demonstrates:    AFib w/CVR >> VT >> SR, frequent PACs    Relevant CV Studies:  10/05/2012: TTE Study Conclusions  - Left ventricle: The cavity size was normal. Wall thickness  was normal. Systolic function was moderately to severely  reduced. The estimated ejection fraction was in the range  of 30% to 35%. Diffuse hypokinesis.  - Aortic valve: Mild to moderate regurgitation.  - Mitral valve: Mild regurgitation.  - Left atrium: The atrium was mildly dilated.  - Right ventricle: The cavity size was moderately dilated.  Systolic function was moderately reduced.  - Right atrium: The atrium was moderately to severely  dilated.  - Tricuspid valve: Moderate regurgitation.  - Pericardium, extracardiac: There was a left pleural  effusion.    Laboratory Data:  High Sensitivity Troponin:   Recent  Labs  Lab  09/24/19 2103 09/24/19 2346  TROPONINIHS 10,382* 9,437*     Chemistry Recent Labs  Lab 09/24/2019 2252 09/20/2019 2252 09/24/19 2103 09/24/19 2138 09/25/19 0435  NA 131*   < > 133* 135 135  K 4.0   < > 3.7 3.5 4.6  CL 98  --  103  --  102  CO2 20*  --  18*  --  21*  GLUCOSE 141*  --  130*  --  124*  BUN 18  --  19  --  20  CREATININE 1.21  --  1.26*  --  1.11  CALCIUM 8.8*  --  8.2*  --  8.6*  GFRNONAA 52*  --  50*  --  58*  GFRAA >60  --  57*  --  >60  ANIONGAP 13  --  12  --  12   < > = values in this interval not displayed.    Recent Labs  Lab 09/17/2019 2252 09/24/19 2103 09/25/19 0435  PROT 6.6 5.8* 6.5  ALBUMIN 2.7* 2.3* 2.6*  AST 175* 123* 93*  ALT 37 74* 68*  ALKPHOS 46 47 49  BILITOT 0.9 1.2 0.9   Hematology Recent Labs  Lab 09/09/2019 2252 09/22/2019 2252 09/24/19 2103 09/24/19 2138 09/25/19 0435  WBC 17.0*  --  14.0*  --  13.6*  RBC 3.80*  --  3.68*  --  3.93*  HGB 13.0   < > 12.6* 11.6* 13.3  HCT 37.3*   < > 37.7* 34.0* 39.7  MCV 98.2  --  102.4*  --  101.0*  MCH 34.2*  --  34.2*  --  33.8  MCHC 34.9  --  33.4  --  33.5  RDW 13.1  --  13.2  --  13.2  PLT 222  --  236  --  217   < > = values in this interval not displayed.   BNPNo results for input(s): BNP, PROBNP in the last 168 hours.  DDimer No results for input(s): DDIMER in the last 168 hours.   Radiology/Studies:   CT Head Wo Contrast Result Date: 09/24/2019 CLINICAL DATA:  Head trauma EXAM: CT HEAD WITHOUT CONTRAST TECHNIQUE: Contiguous axial images were obtained from the base of the skull through the vertex without intravenous contrast. COMPARISON:  September 06, 2019 FINDINGS: Brain: No evidence of acute territorial infarction, hemorrhage, hydrocephalus,extra-axial collection or mass lesion/mass effect. There is dilatation the ventricles and sulci consistent with age-related atrophy. Low-attenuation changes in the deep white matter consistent with small vessel ischemia. Vascular: No hyperdense  vessel or unexpected calcification. Skull: The skull is intact. No fracture or focal lesion identified. Sinuses/Orbits: The visualized paranasal sinuses and mastoid air cells are clear. The orbits and globes intact. Other: None IMPRESSION: No acute intracranial abnormality. Findings consistent with age related atrophy and chronic small vessel ischemia Electronically Signed   By: Prudencio Pair M.D.   On: 09/24/2019 00:04     CT CHEST W CONTRAST Result Date: 09/24/2019 CLINICAL DATA:  Evaluate interstitial lung disease. EXAM: CT CHEST WITH CONTRAST TECHNIQUE: Multidetector CT imaging of the chest was performed during intravenous contrast administration. CONTRAST:  50m OMNIPAQUE IOHEXOL 300 MG/ML  SOLN COMPARISON:  01/25/2007 chest CT. 09/24/2019 chest radiograph. FINDINGS: Cardiovascular: Mild cardiomegaly. No significant pericardial effusion/thickening. Left anterior descending and left circumflex coronary atherosclerosis. Atherosclerotic nonaneurysmal thoracic aorta. Normal caliber main pulmonary artery. No central pulmonary emboli. Mediastinum/Nodes: No discrete thyroid nodules. Unremarkable esophagus. No pathologically enlarged axillary, mediastinal or hilar  lymph nodes. Lungs/Pleura: No pneumothorax. No pleural effusion. No acute consolidative airspace disease, lung masses or significant pulmonary nodules. Coarsely calcified basilar right lower lobe granuloma is unchanged. There is moderate patchy confluent subpleural reticulation and ground-glass opacity throughout both lungs with a basilar predominance. There is associated mild traction bronchiolectasis and architectural distortion. Mild centrilobular and paraseptal emphysema. Scattered regions of mild honeycombing in the lower lobes bilaterally (series 4/image 105 on the right and image 139 on the left). These findings have progressed since 2008 chest CT. Upper abdomen: Small hiatal hernia. Cholelithiasis. Contrast reflux into the IVC and hepatic veins.  Simple exophytic 4.4 cm lower right renal cyst. Musculoskeletal: No aggressive appearing focal osseous lesions. Mild thoracic spondylosis. IMPRESSION: 1. Spectrum of findings compatible with basilar predominant fibrotic interstitial lung disease with mild honeycombing, progressed since 2008 chest CT, considered diagnostic of usual interstitial pneumonia (UIP). Findings are consistent with UIP per consensus guidelines: Diagnosis of Idiopathic Pulmonary Fibrosis: An Official ATS/ERS/JRS/ALAT Clinical Practice Guideline. [place], Iss 5, [phone], Feb 27 2017. 2. Mild cardiomegaly. Contrast reflux into the IVC and hepatic veins, suggesting elevated right heart pressures. 3. Two-vessel coronary atherosclerosis. 4. Small hiatal hernia. 5. Cholelithiasis. 6. Aortic Atherosclerosis (ICD10-I70.0) and Emphysema (ICD10-J43.9). Electronically Signed   By: Ilona Sorrel M.D.   On: 09/24/2019 12:07    DG Chest Portable 1 View Addendum Date: 09/24/2019   ADDENDUM REPORT: 09/24/2019 23:19 ADDENDUM: There is an enteric catheter passing below the diaphragm, tip excluded by collimation. Side port projects just above the gastroesophageal junction. Findings were discussed with the patient's nurse at 11:12 p.m. Electronically Signed   By: Randa Ngo M.D.   On: 09/24/2019 23:19  Result Date: 09/24/2019 CLINICAL DATA:  Code blue, intubated EXAM: PORTABLE CHEST 1 VIEW COMPARISON:  09/24/2019 FINDINGS: Single frontal view of the chest demonstrates external defibrillator pads. Endotracheal tube overlies tracheal air column tip midway between thoracic inlet and carina. Cardiac silhouette is stable. Stable areas of scarring and fibrosis throughout the lungs. No airspace disease, effusion, or pneumothorax. No acute bony abnormalities. IMPRESSION: 1. No complication after intubation. 2. Pulmonary scarring and fibrosis. No superimposed airspace disease. Electronically Signed: By: Randa Ngo M.D. On: 09/24/2019  22:17     {  Assessment and Plan:   1. VT     Suspect multifactorial     prolonged QT, infection/acute illness  Treat underlying medical issues, infection Avoid QT prolonging agents (got a single dose of lexapro here, has been stopped) Out patient notes report his LVEF improved and had cath with NOD May 2014, though I can not find these echo/cath reports Echo is ordered and pending  EKGs reviewed with Dr. Rayann Heman, no felt overwhelmingly ischemic in appearance HS Trop elevation #2 down trending, likely 2/2 to VT, shock, heparin gtt per pharmacy  The patient has been seen/examined by Dr. Rayann Heman this AM No AAD at this time Will defer any ischemic w/u to attending cardiology team     2. AFib (described as chronic)     CHA2DS2Vasc is 69 (age and h/o NICM)     Post shock here he is in SR w/frequent PACs     On xarelto out patient  (appropriately dosed at 63m daily) >> heparin gtt per pharmacy     Given he converted to SR with shock, would not interrupt a/c     Would not expect SR to hold   3. Respiratory failure     Intubated during arrest  Baseline pulm fibrosis     CCM team is on board   4. UTI 5. Encephalopathy     Continue with IM  For questions or updates, please contact [place] Please consult [email or web address] for contact info under     Signed, Baldwin Jamaica, PA-C  09/25/2019 6:44 AM   I have seen, examined the patient, and reviewed the above assessment and plan.  Changes to above are made where necessary.  On exam, intubated.  Follows commands.  RRR.  The patient had VT arrest yesterday.  This began with QT prolongation and initially polymorphic VT before regularizing into VT at 250-270 bpm requiring external defibrillation. QT has improved with discontinuation of lexapro.  Avoid QT prolonging medicines going forward.  Resume anticoagualtion as he has converted to sinus.  I have spoken with Drs Harrington Challenger and Lovena Le who will assist in his care. We will  order echo and consider ischemic eval.  Given advanced age and dementia, a conservative approach may be best.  EP to follow closely with you.    Critical care time was exclusive of separate billable procedures and treating other patients.  Critial care time was spent personally by me (independant of midlevel providers) on the following activities: development of treatment plan with daughter by phone as well as Drs Harrington Challenger and Lovena Le,  evaluation of patients response to treatment, examining patient, obtaining history from patient or surrogate, ordering/ reviewing treatments/ interventions, lab studies, radiographic studies, pulse ox, and re-evaluation of patients condition.     The patient is critically ill with multiple organ systems failure and requires high complexity decision making for assessment and support, frequent evaluation and titration of therapies, application of advanced monitoring technologies and extensive interpretation of databases.   Critical care was necessary to treat or prevent immintent or life-threatening deterioration.    Total CCT spent directly with the patient today is 45 minutes  Thompson Grayer MD, [facility] 09/25/2019 9:29 AM

## 2019-09-25 NOTE — Progress Notes (Signed)
  Echocardiogram 2D Echocardiogram has been performed.  Cameron [name] 09/25/2019, 9:49 AM

## 2019-09-25 NOTE — Consult Note (Signed)
NAME:  Cameron [name], MRN:  [id number], DOB:  [birth year], LOS: 1 ADMISSION DATE:  09/14/2019, CONSULTATION DATE:  09/25/2019 REFERRING MD:  TRH, CHIEF COMPLAINT:  Post ROSC management  Brief History   86 year old man with history of HFpEF, COPD, atrial fibrillation on Xarelto, CAD, pulmonary fibrosis, GERD, hypothyroidism, recent hospitalization from 3/10-3/18 for facial cellulitis and hyponatremia presenting today with acute encephalopathy found to have UTI. Vtach arrest occurred evening of 3/28.  History of present illness   86 year old man with history of HFpEF, COPD, atrial fibrillation on Xarelto, CAD, pulmonary fibrosis, GERD, hypothyroidism, recent hospitalization from 3/10-3/18 for facial cellulitis and hyponatremia presenting today with acute encephalopathy.  He has been having worse lethargy over the past few days.  There have been some reported falls at the facility.  Upon arrival to [facility] ED, he was found to have a UTI and started on antibiotics.  On evening of 3/28 he was found to be in pulseless Vtach.  CPR was initiated.  He was defibrillated x1.  ROSC was achieved after 4 minutes.  He had agonal breathing and was intubated. Was previously on Lexapro, however given increase in Qtc >500 and Vtach, Lexapro was discontinued. Repeat EKG revealed Qtc of ~466.   Past Medical History  HFpEF, COPD, atrial fibrillation on Xarelto, CAD, pulmonary fibrosis, GERD, hypothyroidism  Significant Hospital Events   3/28> vtach arrest, CPR x 4 min with 1 defib. Intubation  Consults:  Cardiology 3/28  Procedures:  None  Significant Diagnostic Tests:  ECHO 3/29:   IMPRESSIONS  1. Left ventricular ejection fraction, by estimation, is 35 to 40%. The  left ventricle has moderately decreased function. The left ventricle  demonstrates regional wall motion abnormalities (see scoring  diagram/findings for description).  Inferior/inferolateral akinesis. Left ventricular diastolic parameters  are  indeterminate.  2. Right ventricular systolic function is mildly reduced. The right  ventricular size is mildly enlarged.  3. Right atrial size was severely dilated.  4. The mitral valve is normal in structure. Moderate mitral valve  regurgitation.  5. The tricuspid valve is abnormal. Tricuspid valve regurgitation is  moderate.  6. The aortic valve is tricuspid. Aortic valve regurgitation is mild to  moderate. Mild aortic valve sclerosis is present, with no evidence of  aortic valve stenosis.   CXR 3/28: Pulmonary scarring and fibrosis. No superimposed airspace disease.  CT chest w/ contrast on 3/28: 1. Spectrum of findings compatible with basilar predominant fibrotic interstitial lung disease with mild honeycombing, progressed since 2008 chest CT, considered diagnostic of usual interstitial pneumonia (UIP). Mild cardiomegaly. Contrast reflux into the IVC and hepatic veins, suggesting elevated right heart pressures. Two-vessel coronary atherosclerosis. Small hiatal hernia. Cholelithiasis. Aortic Atherosclerosis (ICD10-I70.0) and Emphysema (ICD10-J43.9).  CT head w/o contrast 3/28: No acute intracranial abnormality.  Micro Data:  COVID 3/28> neg Blood Cx 3/28> NGTD UCx 3/28>  >=100,000 E. Coli  Antimicrobials:  Ceftriaxone 3/28>  Interim history/subjective:  Intubated, moving extremities spontaneously and able to nod to questions.   Objective   Blood pressure 104/64, pulse 70, temperature 98.1 F (36.7 C), resp. rate 19, height 5' 10.5" (1.791 m), weight 68.2 kg, SpO2 100 %.    Vent Mode: PRVC FiO2 (%):  [40 %-100 %] 40 % Set Rate:  [15 bmp] 15 bmp Vt Set:  [600 mL] 600 mL PEEP:  [5 cmH20] 5 cmH20 Plateau Pressure:  [16 cmH20-21 cmH20] 17 cmH20   Intake/Output Summary (Last 24 hours) at 09/25/2019 1625 Last data filed at 09/25/2019 1200 Gross  per 24 hour  Intake 674.5 ml  Output 220 ml  Net 454.5 ml   Filed Weights   09/25/19 0600 09/25/19 0738  Weight: 68.2  kg 68.2 kg    Examination: General: NAD HENT: EOMI, Anicteric sclera Lungs: Intubated on PRVC, briefly tolerated Pressure support while sitting up but needed to return to [facility]. Lungs CTAB b/l Cardiovascular: RRR Abdomen: Soft, nondistended Extremities: No edema Neuro: Arousable, blinks and nods head to communicate, no gross abnormalities GU: Foley in place  Assessment & Plan:   Critically ill due to acute hypoxic respiratory failure requiring mechanical ventilation. Chest x-ray unchanged from baseline. Patient is able to take adequate tidal volumes on pressure support but becomes apneic likely secondary to residual medication effect. -Reattempt SBT in a.m.  Cardiac arrest: v tach with defibrillation. Normotensive presently. EKG with a fib and prolonged QTc --Trend troponins --Cardiology consulted --Discontinue Lexapro --Replete electrolytes for goal K>4 and Mg>2 --Continue vent support.  UTI: Leukocytosis is improving --Continue ceftriaxone q24h --Follow up urine and blood cultures  Atrial fibrillation: --Hold home anticoagulation. Heparin gtt. --Continue home diltiazem  AKI: Cr now 1.11 from 1.26 --Received IV fluids prior -- CTM  Metabolic acidosis without increased anion gap. Lactate stable at 2.6 from 2.1  Hyponatremia:  --Monitor Na. Improving. Now 137 from 134  Chronic anemia: Hemoglobin stable at 13.3  COPD: Continue albuterol as needed  Dementia: Continue Aricept   Hypothyroidism: Continue home Synthroid  BPH: Continue home Flomax  Best practice:  Diet: N.p.o. Pain/Anxiety/Delirium protocol (if indicated): Fentanyl gtt d/c'd VAP protocol (if indicated): Ordered DVT prophylaxis: Heparin GI prophylaxis: PPI Glucose control: Monitor Mobility: PT when able Code Status: Full Family Communication: Discussed plan with son and daughter-in-law Disposition: ICU  Labs   CBC: Recent Labs  Lab 09/15/2019 2252 09/24/19 2103 09/24/19 2138 09/25/19 0435    WBC 17.0* 14.0*  --  13.6*  NEUTROABS 13.9*  --   --  11.1*  HGB 13.0 12.6* 11.6* 13.3  HCT 37.3* 37.7* 34.0* 39.7  MCV 98.2 102.4*  --  101.0*  PLT 222 236  --  [id number]    Basic Metabolic Panel: Recent Labs  Lab 08/28/2019 2252 09/24/19 2103 09/24/19 2138 09/25/19 0435  NA 131* 133* 135 135  K 4.0 3.7 3.5 4.6  CL 98 103  --  102  CO2 20* 18*  --  21*  GLUCOSE 141* 130*  --  124*  BUN 18 19  --  20  CREATININE 1.21 1.26*  --  1.11  CALCIUM 8.8* 8.2*  --  8.6*  MG  --  1.9  --  2.4  PHOS  --   --   --  3.7   GFR: Estimated Creatinine Clearance: 41.8 mL/min (by C-G formula based on SCr of 1.11 mg/dL). Recent Labs  Lab 09/27/2019 2252 09/24/19 0350 09/24/19 0545 09/24/19 2103 09/25/19 0435  WBC 17.0*  --   --  14.0* 13.6*  LATICACIDVEN  --  2.1* 2.3*  --  2.6*    Liver Function Tests: Recent Labs  Lab 09/10/2019 2252 09/24/19 2103 09/25/19 0435  AST 175* 123* 93*  ALT 37 74* 68*  ALKPHOS 46 47 49  BILITOT 0.9 1.2 0.9  PROT 6.6 5.8* 6.5  ALBUMIN 2.7* 2.3* 2.6*    ABG    Component Value Date/Time   PHART 7.340 (L) 09/24/2019 2138   PCO2ART 38.7 09/24/2019 2138   PO2ART 402.0 (H) 09/24/2019 2138   HCO3 20.7 09/25/2019 0510   TCO2 22  09/24/2019 2138   ACIDBASEDEF 3.9 (H) 09/25/2019 0510   O2SAT 74.3 09/25/2019 0510     Coagulation Profile: Recent Labs  Lab 09/17/2019 2252  INR 3.1*    CBG: Recent Labs  Lab 09/24/19 2041 09/24/19 2136  GLUCAP 132* 124*   CRITICAL CARE Performed by: Kipp Brood   Total critical care time: 45 minutes  Critical care time was exclusive of separately billable procedures and treating other patients.  Critical care was necessary to treat or prevent imminent or life-threatening deterioration.  Critical care was time spent personally by me on the following activities: development of treatment plan with patient and/or surrogate as well as nursing, discussions with consultants, evaluation of patient's response to  treatment, examination of patient, obtaining history from patient or surrogate, ordering and performing treatments and interventions, ordering and review of laboratory studies, ordering and review of radiographic studies, pulse oximetry, re-evaluation of patient's condition and participation in multidisciplinary rounds.  Kipp Brood, MD [facility] ICU Physician [place]  Pager: [phone] Mobile: [phone] After hours: [phone].  .  09/25/2019, 6:43 PM

## 2019-09-25 NOTE — Progress Notes (Signed)
ANTICOAGULATION CONSULT NOTE - Initial Consult  Pharmacy Consult for Heparin (Xarelto on hold) Indication: atrial fibrillation  No Known Allergies  Patient Measurements: 69.5kg  Vital Signs: Temp: 97.9 F (36.6 C) (03/29 0000) Temp Source: Axillary (03/29 0000) BP: 109/71 (03/29 0142) Pulse Rate: 66 (03/29 0142)  Labs: Recent Labs    09/17/2019 2252 09/04/2019 2252 09/24/19 2103 09/24/19 2138 09/24/19 2346  HGB 13.0   < > 12.6* 11.6*  --   HCT 37.3*  --  37.7* 34.0*  --   PLT 222  --  236  --   --   LABPROT 31.9*  --   --   --   --   INR 3.1*  --   --   --   --   CREATININE 1.21  --  1.26*  --   --   TROPONINIHS  --   --  10,382*  --  9,437*   < > = values in this interval not displayed.    Estimated Creatinine Clearance: 37.5 mL/min (A) (by C-G formula based on SCr of 1.26 mg/dL (H)).   Medical History: Past Medical History:  Diagnosis Date  . Arthritis   . CHF (congestive heart failure) ([place])   . COPD (chronic obstructive pulmonary disease) ([place])   . Dysrhythmia    atrial fibrilation  . GERD (gastroesophageal reflux disease)   . Pulmonary fibrosis [facility])      Assessment: 86 y/o M with recent hospital stay for facial cellulitis. Presented back to the ED with worsening lethargy/falls. Late yesterday evening he experienced cardiac arrest with ROSC. He is on Xarelto PTA for atrial fibrillation. This will be held for now and the patient will be started on a heparin drip in the meantime. A dose of Xarelto was given 3/28 at 1711. The heparin drip will be started 24 hours from this time. Will check a baseline heparin level and aPTT with AM labs, but expect to use aPTT to dose for now.   Goal of Therapy:  Heparin level 0.3-0.7 units/ml aPTT 66-102 seconds Monitor platelets by anticoagulation protocol: Yes   Plan:  -Will check baseline heparin level and aPTT with AM labs -Start heparin drip at 850 units/hr at 1700 today -0100 aPTT/HL on 3/30 -Daily CBC/HL -Monitor  closely for bleeding  Narda Bonds, PharmD, BCPS Clinical Pharmacist Phone: [phone]

## 2019-09-26 DIAGNOSIS — I4891 Unspecified atrial fibrillation: Secondary | ICD-10-CM | POA: Diagnosis not present

## 2019-09-26 DIAGNOSIS — I472 Ventricular tachycardia: Secondary | ICD-10-CM | POA: Diagnosis not present

## 2019-09-26 DIAGNOSIS — I2510 Atherosclerotic heart disease of native coronary artery without angina pectoris: Secondary | ICD-10-CM | POA: Diagnosis not present

## 2019-09-26 DIAGNOSIS — I469 Cardiac arrest, cause unspecified: Secondary | ICD-10-CM | POA: Diagnosis not present

## 2019-09-26 LAB — CBC
HCT: 31.8 % — ABNORMAL LOW (ref 39.0–52.0)
Hemoglobin: 10.8 g/dL — ABNORMAL LOW (ref 13.0–17.0)
MCH: 33.5 pg (ref 26.0–34.0)
MCHC: 34 g/dL (ref 30.0–36.0)
MCV: 98.8 fL (ref 80.0–100.0)
Platelets: 193 10*3/uL (ref 150–400)
RBC: 3.22 MIL/uL — ABNORMAL LOW (ref 4.22–5.81)
RDW: 13.2 % (ref 11.5–15.5)
WBC: 10.5 10*3/uL (ref 4.0–10.5)
nRBC: 0 % (ref 0.0–0.2)

## 2019-09-26 LAB — BASIC METABOLIC PANEL
Anion gap: 12 (ref 5–15)
BUN: 25 mg/dL — ABNORMAL HIGH (ref 8–23)
CO2: 19 mmol/L — ABNORMAL LOW (ref 22–32)
Calcium: 8.1 mg/dL — ABNORMAL LOW (ref 8.9–10.3)
Chloride: 103 mmol/L (ref 98–111)
Creatinine, Ser: 1.15 mg/dL (ref 0.61–1.24)
GFR calc Af Amer: >60 mL/min (ref >60)
GFR calc non Af Amer: 55 mL/min — ABNORMAL LOW (ref >60)
Glucose, Bld: 96 mg/dL (ref 70–99)
Potassium: 3.8 mmol/L (ref 3.5–5.1)
Sodium: 134 mmol/L — ABNORMAL LOW (ref 135–145)

## 2019-09-26 LAB — URINE CULTURE: Culture: >=100000 — AB

## 2019-09-26 LAB — APTT
aPTT: 65 seconds — ABNORMAL HIGH (ref 24–36)
aPTT: 64 seconds — ABNORMAL HIGH (ref 24–36)
aPTT: 58 seconds — ABNORMAL HIGH (ref 24–36)

## 2019-09-26 LAB — HEPARIN LEVEL (UNFRACTIONATED): Heparin Unfractionated: 1.56 IU/mL — ABNORMAL HIGH (ref 0.30–0.70)

## 2019-09-26 MED ORDER — POTASSIUM CHLORIDE 20 MEQ/15ML (10%) PO SOLN
20.0000 meq | Freq: Once | ORAL | Status: AC
  Administered 2019-09-26: 20 meq via ORAL
  Filled 2019-09-26: qty 15

## 2019-09-26 NOTE — Progress Notes (Addendum)
Progress Note  Patient Name: Cameron [name] Date of Encounter: 09/26/2019  Primary Cardiologist: Dr. Irish Lack  Subjective   Shakes head "no" to question of CP, awake, follows commands appropriately  Inpatient Medications    Scheduled Meds:  chlorhexidine gluconate (MEDLINE KIT)  15 mL Mouth Rinse BID   Chlorhexidine Gluconate Cloth  6 each Topical Daily   diltiazem  30 mg Per Tube Q6H   donepezil  10 mg Per Tube QHS   erythromycin  1 application Left Eye QID   levothyroxine  75 mcg Per Tube Q0600   mouth rinse  15 mL Mouth Rinse 10 times per day   pantoprazole sodium  40 mg Per Tube Daily   Continuous Infusions:  cefTRIAXone (ROCEPHIN)  IV 200 mL/hr at 09/26/19 0639   fentaNYL infusion INTRAVENOUS Stopped (09/25/19 1201)   heparin 900 Units/hr (09/26/19 0639)   PRN Meds: acetaminophen, albuterol, fentaNYL, polyethylene glycol   Vital Signs    Vitals:   09/26/19 0416 09/26/19 0500 09/26/19 0555 09/26/19 0600  BP: 101/62 (!) 103/55  (!) 107/56  Pulse: 68 60  67  Resp: (!) 28 15  14   Temp:   98.5 F (36.9 C)   TempSrc:   Axillary   SpO2: 100% 100%  100%  Weight:    66.3 kg  Height:        Intake/Output Summary (Last 24 hours) at 09/26/2019 0728 Last data filed at 09/26/2019 7425 Gross per 24 hour  Intake 245.13 ml  Output 375 ml  Net -129.87 ml   Last 3 Weights 09/26/2019 09/25/2019 09/25/2019  Weight (lbs) 146 lb 2.6 oz 150 lb 5.7 oz 150 lb 5.7 oz  Weight (kg) 66.3 kg 68.2 kg 68.2 kg      Telemetry    Maintaining SR, PACs rates 70's - Personally Reviewed  ECG    No new EKGs - Personally Reviewed  Physical Exam   GEN: elderly, on vent.   Neck: No JVD Cardiac: RRR, extrasystoles, soft SM, no rubs, or gallops.  Respiratory: CTA b/l (ant aus only), on vent. GI: Soft, nontender MS: No edema; age appropriate atrophy Neuro: wakes easily to verbal, follows commands  Psych: unable to assess well  Labs    High Sensitivity Troponin:   Recent Labs    Lab 09/24/19 2103 09/24/19 2346  TROPONINIHS 10,382* 9,437*      Chemistry Recent Labs  Lab 09/24/2019 2252 08/28/2019 2252 09/24/19 2103 09/24/19 2138 09/25/19 0435  NA 131*   < > 133* 135 135  K 4.0   < > 3.7 3.5 4.6  CL 98  --  103  --  102  CO2 20*  --  18*  --  21*  GLUCOSE 141*  --  130*  --  124*  BUN 18  --  19  --  20  CREATININE 1.21  --  1.26*  --  1.11  CALCIUM 8.8*  --  8.2*  --  8.6*  PROT 6.6  --  5.8*  --  6.5  ALBUMIN 2.7*  --  2.3*  --  2.6*  AST 175*  --  123*  --  93*  ALT 37  --  74*  --  68*  ALKPHOS 46  --  47  --  49  BILITOT 0.9  --  1.2  --  0.9  GFRNONAA 52*  --  50*  --  58*  GFRAA >60  --  57*  --  >60  ANIONGAP 13  --  12  --  12   < > = values in this interval not displayed.     Hematology Recent Labs  Lab 08/31/2019 2252 09/07/2019 2252 09/24/19 2103 09/24/19 2138 09/25/19 0435  WBC 17.0*  --  14.0*  --  13.6*  RBC 3.80*  --  3.68*  --  3.93*  HGB 13.0   < > 12.6* 11.6* 13.3  HCT 37.3*   < > 37.7* 34.0* 39.7  MCV 98.2  --  102.4*  --  101.0*  MCH 34.2*  --  34.2*  --  33.8  MCHC 34.9  --  33.4  --  33.5  RDW 13.1  --  13.2  --  13.2  PLT 222  --  236  --  217   < > = values in this interval not displayed.    BNPNo results for input(s): BNP, PROBNP in the last 168 hours.   DDimer No results for input(s): DDIMER in the last 168 hours.   Radiology      Cardiac Studies   09/25/2019: TTE IMPRESSIONS   1. Left ventricular ejection fraction, by estimation, is 35 to 40%. The  left ventricle has moderately decreased function. The left ventricle  demonstrates regional wall motion abnormalities (see scoring  diagram/findings for description).  Inferior/inferolateral akinesis. Left ventricular diastolic parameters are  indeterminate.   2. Right ventricular systolic function is mildly reduced. The right  ventricular size is mildly enlarged.   3. Right atrial size was severely dilated.   4. The mitral valve is normal in structure.  Moderate mitral valve  regurgitation.   5. The tricuspid valve is abnormal. Tricuspid valve regurgitation is  moderate.   6. The aortic valve is tricuspid. Aortic valve regurgitation is mild to  moderate. Mild aortic valve sclerosis is present, with no evidence of  aortic valve stenosis.     10/07/2012: [facility] IMPRESSIONS:  Absent  left main coronary artery.  Mild, diffuse disease  left anterior descending artery and its branches.  Mild, diffuse disease in the  left circumflex artery and its branches.  Patent  right coronary artery. Normal left ventricular systolic not assessed.  LVEDP 19  mmHg.     Patient Profile     86 y.o. male w/PMHx of  of COPD, GERD, Pulmonary fibrosis, hypothyroidism, chronic CHF (diastolic), historically NICM, permanent AFib   The patient recently hospitalized for dental infection > facial cellulitis discharged back to SNF, completed his antibiotics though developed lethargy, confusion, sufered a fall Admitted yesterday early morning with metabolic encephalopathy, suspect dehydration, hyponatremia (suspected SIADH during his last hospitalization).   Initial management included gentle hydration, imaging and w/u into possible infectious process.  Suspect UTI and started on Abx  3/28: VT arrest >> ICU, intubated  Assessment & Plan    1. VT arrest      None further 2. HS felt c/w NSTEMI      From EP perspective prolonged QT >> better by last EKG infection/acute illness likely contributes as well   Avoid QT prolonging agents (got a single dose of lexapro here, has been stopped) Out patient notes report his LVEF improved and had cath with NOD May 2014, though I can not find that May echo report Echo yesterday noted LVEF 35-40% w/Inferior/inferolateral akinesis, RV mildly enlarged with mildly reduced function 2014 cath with NOD  No labs this AM, will order  Ischemic w/u, management deferred to primary cardiology team EP remains available Dr. Lovena Le will  see later today  3. AFib (described as chronic)     CHA2DS2Vasc is 14 (age and h/o NICM)     Post shock here he is in SR w/frequent PACs     On xarelto out patient  (appropriately dosed at 48m daily) >> heparin gtt per pharmacy     Given he converted to SR with shock, would not interrupt a/c     Would not expect SR to hold, though so far is   4. Respiratory failure     Intubated during arrest     Baseline pulm fibrosis     CCM team is on board, hopefully can extubate soon   5. UTI 6. Encephalopathy     Continue with IM  For questions or updates, please contact CKangleyPlease consult [email or web address] for contact info under   Signed, RBaldwin Jamaica PA-C  09/26/2019, 7:28 AM    EP Attending  Patient seen and examined. Agree with the findings as noted above. His VT has resolved and he remains in NSR. He is awake and appropriately responding to questions and hopefully can be extubated today. His tele demonstrates NSR. We will sign off at this point. Please call u[place]if he has more VT. Avoid QT prolonging drugs. I doubt invasive workup for ischemia likely to change long term care or prognosis.  GMikle BosworthD.

## 2019-09-26 NOTE — Progress Notes (Signed)
Progress Note  Patient Name: Cameron [name] Date of Encounter: 09/26/2019  Primary Cardiologist: Tamala Julian  Subjective    Pt on ventilator, weaning protocol  Awake    Inpatient Medications    Scheduled Meds: . chlorhexidine gluconate (MEDLINE KIT)  15 mL Mouth Rinse BID  . Chlorhexidine Gluconate Cloth  6 each Topical Daily  . diltiazem  30 mg Per Tube Q6H  . donepezil  10 mg Per Tube QHS  . erythromycin  1 application Left Eye QID  . levothyroxine  75 mcg Per Tube Q0600  . mouth rinse  15 mL Mouth Rinse 10 times per day  . pantoprazole sodium  40 mg Per Tube Daily   Continuous Infusions: . cefTRIAXone (ROCEPHIN)  IV 200 mL/hr at 09/26/19 0639  . fentaNYL infusion INTRAVENOUS Stopped (09/25/19 1201)  . heparin 900 Units/hr (09/26/19 0639)   PRN Meds: acetaminophen, albuterol, fentaNYL, polyethylene glycol   Vital Signs    Vitals:   09/26/19 0416 09/26/19 0500 09/26/19 0555 09/26/19 0600  BP: 101/62 (!) 103/55  (!) 107/56  Pulse: 68 60  67  Resp: (!) _0 Temp:   98.5 F (36.9 C)   TempSrc:   Axillary   SpO2: 100% 100%  100%  Weight:    66.3 kg  Height:        Intake/Output Summary (Last 24 hours) at 09/26/2019 0710 Last data filed at 09/26/2019 8757 Gross per 24 hour  Intake 245.13 ml  Output 375 ml  Net -129.87 ml   I/O   +2.3 L  Last 3 Weights 09/26/2019 09/25/2019 09/25/2019  Weight (lbs) 146 lb 2.6 oz 150 lb 5.7 oz 150 lb 5.7 oz  Weight (kg) 66.3 kg 68.2 kg 68.2 kg      Telemetry     SR with PACs- Personally Reviewed  ECG      Physical Exam   GEN: No acute distress.   Neck: No JVD Cardiac: RRR, no murmurs, rubs, or gallops.  Respiratory: Clear to auscultation bilaterally. GI: Soft, nontender, non-distended  MS: No edema; No deformity. Neuro:  Nonfocal  Psych: Normal affect   Labs    High Sensitivity Troponin:   Recent Labs  Lab 09/24/19 2103 09/24/19 2346  TROPONINIHS 10,382* 9,437*      Chemistry Recent Labs  Lab  09/13/2019 2252 09/04/2019 2252 09/24/19 2103 09/24/19 2138 09/25/19 0435  NA 131*   < > 133* 135 135  K 4.0   < > 3.7 3.5 4.6  CL 98  --  103  --  102  CO2 20*  --  18*  --  21*  GLUCOSE 141*  --  130*  --  124*  BUN 18  --  19  --  20  CREATININE 1.21  --  1.26*  --  1.11  CALCIUM 8.8*  --  8.2*  --  8.6*  PROT 6.6  --  5.8*  --  6.5  ALBUMIN 2.7*  --  2.3*  --  2.6*  AST 175*  --  123*  --  93*  ALT 37  --  74*  --  68*  ALKPHOS 46  --  47  --  49  BILITOT 0.9  --  1.2  --  0.9  GFRNONAA 52*  --  50*  --  58*  GFRAA >60  --  57*  --  >60  ANIONGAP 13  --  12  --  12   < > =  values in this interval not displayed.     Hematology Recent Labs  Lab 09/02/2019 2252 08/29/2019 2252 09/24/19 2103 09/24/19 2138 09/25/19 0435  WBC 17.0*  --  14.0*  --  13.6*  RBC 3.80*  --  3.68*  --  3.93*  HGB 13.0   < > 12.6* 11.6* 13.3  HCT 37.3*   < > 37.7* 34.0* 39.7  MCV 98.2  --  102.4*  --  101.0*  MCH 34.2*  --  34.2*  --  33.8  MCHC 34.9  --  33.4  --  33.5  RDW 13.1  --  13.2  --  13.2  PLT 222  --  236  --  217   < > = values in this interval not displayed.    BNPNo results for input(s): BNP, PROBNP in the last 168 hours.   DDimer No results for input(s): DDIMER in the last 168 hours.   Radiology    DG Abd 1 View  Result Date: 09/24/2019 CLINICAL DATA:  Enteric catheter placement EXAM: ABDOMEN - 1 VIEW COMPARISON:  09/24/2019 FINDINGS: Frontal view of the lower chest and upper abdomen demonstrates enteric catheter tip projecting over gastric fundus. Side port projects at the gastroesophageal junction. Lung bases are clear. External defibrillator pads are noted. IMPRESSION: 1. Enteric catheter tip projecting over gastric fundus. Electronically Signed   By: Randa Ngo M.D.   On: 09/24/2019 23:48   DG Chest Portable 1 View  Addendum Date: 09/24/2019   ADDENDUM REPORT: 09/24/2019 23:19 ADDENDUM: There is an enteric catheter passing below the diaphragm, tip excluded by  collimation. Side port projects just above the gastroesophageal junction. Findings were discussed with the patient's nurse at 11:12 p.m. Electronically Signed   By: Randa Ngo M.D.   On: 09/24/2019 23:19   Result Date: 09/24/2019 CLINICAL DATA:  Code blue, intubated EXAM: PORTABLE CHEST 1 VIEW COMPARISON:  09/24/2019 FINDINGS: Single frontal view of the chest demonstrates external defibrillator pads. Endotracheal tube overlies tracheal air column tip midway between thoracic inlet and carina. Cardiac silhouette is stable. Stable areas of scarring and fibrosis throughout the lungs. No airspace disease, effusion, or pneumothorax. No acute bony abnormalities. IMPRESSION: 1. No complication after intubation. 2. Pulmonary scarring and fibrosis. No superimposed airspace disease. Electronically Signed: By: Randa Ngo M.D. On: 09/24/2019 22:17   ECHOCARDIOGRAM COMPLETE  Result Date: 09/25/2019    ECHOCARDIOGRAM REPORT   Patient Name:   Cameron [name] Date of Exam: 09/25/2019 Medical Rec #:  [id number]       Height:       70.5 in Accession #:    [id number]      Weight:       150.4 lb Date of Birth:  06/09/1926      BSA:          1.859 m Patient Age:    86 years        BP:           100/59 mmHg Patient Gender: M               HR:           66 bpm. Exam Location:  Inpatient Procedure: 2D Echo, Cardiac Doppler and Color Doppler Indications:    Cardiac arrest I46.9  History:        Patient has prior history of Echocardiogram examinations, most                 recent 10/05/2012. CHF,  Arrythmias:Atrial Fibrillation and Cardiac                 Arrest, Signs/Symptoms:Hypotension; Risk Factors:Former Smoker.  Sonographer:    Vickie Epley RDCS Referring Phys: [id number] Milagros Loll IMPRESSIONS  1. Left ventricular ejection fraction, by estimation, is 35 to 40%. The left ventricle has moderately decreased function. The left ventricle demonstrates regional wall motion abnormalities (see scoring diagram/findings for  description). Inferior/inferolateral akinesis. Left ventricular diastolic parameters are indeterminate.  2. Right ventricular systolic function is mildly reduced. The right ventricular size is mildly enlarged.  3. Right atrial size was severely dilated.  4. The mitral valve is normal in structure. Moderate mitral valve regurgitation.  5. The tricuspid valve is abnormal. Tricuspid valve regurgitation is moderate.  6. The aortic valve is tricuspid. Aortic valve regurgitation is mild to moderate. Mild aortic valve sclerosis is present, with no evidence of aortic valve stenosis. FINDINGS  Left Ventricle: Left ventricular ejection fraction, by estimation, is 35 to 40%. The left ventricle has moderately decreased function. The left ventricle demonstrates regional wall motion abnormalities. The left ventricular internal cavity size was normal in size. There is no left ventricular hypertrophy. Left ventricular diastolic parameters are indeterminate.  LV Wall Scoring: The inferior wall and posterior wall are akinetic. The entire anterior wall, antero-lateral wall, entire septum, and entire apex are normal. Right Ventricle: The right ventricular size is mildly enlarged. Right vetricular wall thickness was not assessed. Right ventricular systolic function is mildly reduced. There is mildly elevated pulmonary artery systolic pressure. The tricuspid regurgitant velocity is 2.74 m/s, and with an assumed right atrial pressure of 8 mmHg, the estimated right ventricular systolic pressure is 74.1 mmHg. Left Atrium: Left atrial size was normal in size. Right Atrium: Right atrial size was severely dilated. Pericardium: Trivial pericardial effusion is present. Mitral Valve: The mitral valve is normal in structure. Moderate mitral valve regurgitation. Tricuspid Valve: The tricuspid valve is abnormal. Tricuspid valve regurgitation is moderate. Aortic Valve: The aortic valve is tricuspid. Aortic valve regurgitation is mild to moderate.  Aortic regurgitation PHT measures 640 msec. Mild aortic valve sclerosis is present, with no evidence of aortic valve stenosis. Pulmonic Valve: The pulmonic valve was not well visualized. Pulmonic valve regurgitation is trivial. Aorta: The aortic root is normal in size and structure. Venous: IVC assessment for right atrial pressure unable to be performed due to mechanical ventilation. IAS/Shunts: No atrial level shunt detected by color flow Doppler.  LEFT VENTRICLE PLAX 2D LVIDd:         5.40 cm      Diastology LVIDs:         4.40 cm      LV e' lateral: 3.94 cm/s LV PW:         0.80 cm      LV e' medial:  6.49 cm/s LV IVS:        0.80 cm LVOT diam:     2.10 cm LV SV:         34 LV SV Index:   18 LVOT Area:     3.46 cm  LV Volumes (MOD) LV vol d, MOD A2C: 111.0 ml LV vol d, MOD A4C: 126.0 ml LV vol s, MOD A2C: 70.3 ml LV vol s, MOD A4C: 76.6 ml LV SV MOD A2C:     40.7 ml LV SV MOD A4C:     126.0 ml LV SV MOD BP:      50.4 ml RIGHT VENTRICLE RV S prime:  9.00 cm/s TAPSE (M-mode): 1.9 cm LEFT ATRIUM           Index       RIGHT ATRIUM           Index LA diam:      3.90 cm 2.10 cm/m  RA Area:     31.70 cm LA Vol (A2C): 41.7 ml 22.43 ml/m RA Volume:   114.00 ml 61.33 ml/m LA Vol (A4C): 48.1 ml 25.88 ml/m  AORTIC VALVE LVOT Vmax:   58.70 cm/s LVOT Vmean:  36.200 cm/s LVOT VTI:    0.098 m AI PHT:      640 msec  AORTA Ao Root diam: 3.20 cm MR Peak grad:    79.7 mmHg   TRICUSPID VALVE MR Mean grad:    55.0 mmHg   TR Peak grad:   30.0 mmHg MR Vmax:         446.50 cm/s TR Vmax:        274.00 cm/s MR Vmean:        352.0 cm/s MR PISA:         0.57 cm    SHUNTS MR PISA Eff ROA: 5 mm       Systemic VTI:  0.10 m MR PISA Radius:  0.30 cm     Systemic Diam: 2.10 cm Oswaldo Milian MD Electronically signed by Oswaldo Milian MD Signature Date/Time: 09/25/2019/1:26:45 PM    Final     Cardiac Studies   09/25/2019: TTE IMPRESSIONS  1. Left ventricular ejection fraction, by estimation, is 35 to 40%. The  left  ventricle has moderately decreased function. The left ventricle  demonstrates regional wall motion abnormalities (see scoring  diagram/findings for description).  Inferior/inferolateral akinesis. Left ventricular diastolic parameters are  indeterminate.  2. Right ventricular systolic function is mildly reduced. The right  ventricular size is mildly enlarged.  3. Right atrial size was severely dilated.  4. The mitral valve is normal in structure. Moderate mitral valve  regurgitation.  5. The tricuspid valve is abnormal. Tricuspid valve regurgitation is  moderate.  6. The aortic valve is tricuspid. Aortic valve regurgitation is mild to  moderate. Mild aortic valve sclerosis is present, with no evidence of  aortic valve stenosis.     10/07/2012: [facility] IMPRESSIONS: 1. Absent left main coronary artery. 2. Mild, diffuse disease left anterior descending artery and its branches. 3. Mild, diffuse disease in the left circumflex artery and its branches. 4. Patent right coronary artery. 5. Normal left ventricular systolic not assessed. LVEDP 19 mmHg.    Patient Profile     86 y.o. male with hx of chronic atrial fibrillatoin, Hx NICM (normalization of LVEF in 2014), pulmonary fibrosis, COPD, GERD who present with enceophalopathy, ? Dehydration, hyponatremia.     Assessment & Plan    1 Rhythm   Pt developed episode of polymorphic VT on 3/28 at 20:30  Defib to ST   QT prolonged probably from recent addition of Lexapro.   May also have been effected from injury/ischemia No further spells  Avoid drugs that prolong QT    2   CAD Pt with minimal dz in 2014    Presents with no CP or acute ST changes   BUt  Troponin with signif bump  10,000  At 21:00 on  09/24/19.    This is 30 min after cardiac arrest   This goes along that ischemia/injury was happenng  before the arrhythmic event   Echo with regional wall motion changes and depressed LVEF (  noted above) consistent with event and  CAD   Further eval deferred until extubated   Will review with family and pt then    2  Atrial fibrillation  Pt had chronic atrial fibrillation   Converted to SR after defibrillatoin   He remains in SR     He had been on Xarelto chronically and is now on heparin   Continue tele.    5 ID  On admit WBC 17 K     Now 10.5   On empiric ABx   6  Pulmonary   Pt intubated  Undergoing wean  CCM is following / directing    7  Neuro   Per pt's daughter in law he was engaging normally for age couple weeks ago  Only recently confused     Had been treated for cellulits of face    Started on lexapro recently as outpt for possible depression   This has been stopped     For questions or updates, please contact [facility] Please consult [email or web address] for contact info under        Signed, Dorris Carnes, MD  09/26/2019, 7:10 AM

## 2019-09-26 NOTE — Progress Notes (Signed)
ANTICOAGULATION CONSULT NOTE   Pharmacy Consult for Heparin (Xarelto on hold) Indication: atrial fibrillation  No Known Allergies  Patient Measurements: Weight: 66.3 kg  Vital Signs: Temp: 98.4 F (36.9 C) (03/30 1613) Temp Source: Oral (03/30 1613) BP: 90/44 (03/30 1800) Pulse Rate: 61 (03/30 1800)  Labs: Recent Labs    09/16/2019 2252 09/26/2019 2252 09/24/19 2103 09/24/19 2103 09/24/19 2138 09/24/19 2138 09/24/19 2346 09/25/19 0435 09/25/19 0435 09/26/19 0047 09/26/19 0940 09/26/19 2050  HGB 13.0   < > 12.6*   < > 11.6*   < >  --  13.3  --   --  10.8*  --   HCT 37.3*   < > 37.7*   < > 34.0*  --   --  39.7  --   --  31.8*  --   PLT 222   < > 236  --   --   --   --  217  --   --  193  --   APTT  --   --   --   --   --   --   --  28   < > 65* 64* 58*  LABPROT 31.9*  --   --   --   --   --   --   --   --   --   --   --   INR 3.1*  --   --   --   --   --   --   --   --   --   --   --   HEPARINUNFRC  --   --   --   --   --   --   --  >2.20*  --  1.56*  --   --   CREATININE 1.21   < > 1.26*  --   --   --   --  1.11  --   --  1.15  --   TROPONINIHS  --   --  10,382*  --   --   --  9,437*  --   --   --   --   --    < > = values in this interval not displayed.    Estimated Creatinine Clearance: 39.2 mL/min (by C-G formula based on SCr of 1.15 mg/dL).   Medical History: Past Medical History:  Diagnosis Date  . Arthritis   . CHF (congestive heart failure) ([place])   . COPD (chronic obstructive pulmonary disease) ([place])   . Dysrhythmia    atrial fibrilation  . GERD (gastroesophageal reflux disease)   . Pulmonary fibrosis [facility])      Assessment: 86 y/o M with recent hospital stay for facial cellulitis. Presented back to the ED with worsening lethargy/falls, now s/p cardiac arrest 3/28 with ROSC. He is on Xarelto PTA for atrial fibrillation. This will be held for now and the patient will be started on a heparin drip. Last dose of Xarelto was given in the hospital on 3/28 at  1711.   APTT remains subtherapeutic at 58 this evening after rate increase earlier today. Heparin level elevated as expected due to influence of Xarelto. H&H is decreased to 10.8/31.8, plts wnl at 193. No bleeding or issues with infusion per discussion with RN.  Goal of Therapy:  Heparin level 0.3-0.7 units/ml aPTT 66-102 seconds Monitor platelets by anticoagulation protocol: Yes   Plan:  Increase heparin infusion to 1050 units/hr  Check 8hr aPTT Monitor daily heparin  level and aPTT until level correlating, CBC, s/sx bleeding   Arturo Morton, PharmD, BCPS Please check AMION for all [facility] contact numbers Clinical Pharmacist 09/26/2019 9:51 PM

## 2019-09-26 NOTE — Procedures (Signed)
Extubation Procedure Note  Patient Details:   Name: MURAD PAMPHILE DOB: [birth year] MRN: [id number]   Airway Documentation:    Vent end date: 09/26/19 Vent end time: 1120   Evaluation  O2 sats: stable throughout Complications: No apparent complications Patient did tolerate procedure well. Bilateral Breath Sounds: Clear, Diminished   Yes   RT extubated patient to 3L [place] with RN at bedside per MD order. Positive cuff leak noted. Patient sating 99% on 3L. No stridor noted. RT will continue to monitor as needed.   Vernona Rieger 09/26/2019, 11:23 AM

## 2019-09-26 NOTE — Progress Notes (Signed)
ANTICOAGULATION CONSULT NOTE Pharmacy Consult for Heparin (Xarelto on hold) Indication: atrial fibrillation  No Known Allergies  Patient Measurements: 69.5kg  Vital Signs: Temp: 97.5 F (36.4 C) (03/30 0056) Temp Source: Axillary (03/30 0056) BP: 94/55 (03/30 0100) Pulse Rate: 61 (03/30 0100)  Labs: Recent Labs    09/07/2019 2252 09/01/2019 2252 09/24/19 2103 09/24/19 2103 09/24/19 2138 09/24/19 2346 09/25/19 0435 09/26/19 0047  HGB 13.0   < > 12.6*   < > 11.6*  --  13.3  --   HCT 37.3*   < > 37.7*  --  34.0*  --  39.7  --   PLT 222  --  236  --   --   --  217  --   APTT  --   --   --   --   --   --  28 65*  LABPROT 31.9*  --   --   --   --   --   --   --   INR 3.1*  --   --   --   --   --   --   --   HEPARINUNFRC  --   --   --   --   --   --  >2.20* 1.56*  CREATININE 1.21  --  1.26*  --   --   --  1.11  --   TROPONINIHS  --   --  10,382*  --   --  9,437*  --   --    < > = values in this interval not displayed.    Estimated Creatinine Clearance: 41.8 mL/min (by C-G formula based on SCr of 1.11 mg/dL).   Medical History: Past Medical History:  Diagnosis Date  . Arthritis   . CHF (congestive heart failure) ([place])   . COPD (chronic obstructive pulmonary disease) ([place])   . Dysrhythmia    atrial fibrilation  . GERD (gastroesophageal reflux disease)   . Pulmonary fibrosis [facility])      Assessment: 86 y/o M with recent hospital stay for facial cellulitis. Presented back to the ED with worsening lethargy/falls. Late yesterday evening he experienced cardiac arrest with ROSC. He is on Xarelto PTA for atrial fibrillation.   PTT 65 seconds  Goal of Therapy:  Heparin level 0.3-0.7 units/ml aPTT 66-102 seconds Monitor platelets by anticoagulation protocol: Yes   Plan:  Increase heparin to 900 units / hr Follow up AM labs  Thank you Anette Guarneri, PharmD  Phone: [phone]

## 2019-09-26 NOTE — Progress Notes (Signed)
NAME:  Cameron [name], MRN:  [id number], DOB:  25-Aug-1925, LOS: 2 ADMISSION DATE:  09/09/2019, CONSULTATION DATE:  09/25/2019 REFERRING MD:  TRH, CHIEF COMPLAINT:  Post ROSC management  Brief History   86 year old man with history of HFpEF, COPD, atrial fibrillation on Xarelto, CAD, pulmonary fibrosis, GERD, hypothyroidism, recent hospitalization from 3/10-3/18 for facial cellulitis and hyponatremia presenting today with acute encephalopathy found to have UTI. Vtach arrest occurred evening of 3/28.  History of present illness   86 year old man with history of HFpEF, COPD, atrial fibrillation on Xarelto, CAD, pulmonary fibrosis, GERD, hypothyroidism, recent hospitalization from 3/10-3/18 for facial cellulitis and hyponatremia presenting today with acute encephalopathy.  He has been having worse lethargy over the past few days.  There have been some reported falls at the facility.  Upon arrival to [facility] ED, he was found to have a UTI and started on antibiotics.  On evening of 3/28 he was found to be in pulseless Vtach.  CPR was initiated.  He was defibrillated x1.  ROSC was achieved after 4 minutes.  He had agonal breathing and was intubated. Was previously on Lexapro, however given increase in Qtc >500 and Vtach, Lexapro was discontinued. Repeat EKG revealed Qtc of ~480s.   Past Medical History  HFpEF, COPD, atrial fibrillation on Xarelto, CAD, pulmonary fibrosis, GERD, hypothyroidism  Significant Hospital Events   3/28> vtach arrest, CPR x 4 min with 1 defib. Intubation  Consults:  Cardiology 3/28  Procedures:  None  Significant Diagnostic Tests:  ECHO 3/29:   IMPRESSIONS  1. Left ventricular ejection fraction, by estimation, is 35 to 40%. The  left ventricle has moderately decreased function. The left ventricle  demonstrates regional wall motion abnormalities (see scoring  diagram/findings for description).  Inferior/inferolateral akinesis. Left ventricular diastolic parameters  are  indeterminate.  2. Right ventricular systolic function is mildly reduced. The right  ventricular size is mildly enlarged.  3. Right atrial size was severely dilated.  4. The mitral valve is normal in structure. Moderate mitral valve  regurgitation.  5. The tricuspid valve is abnormal. Tricuspid valve regurgitation is  moderate.  6. The aortic valve is tricuspid. Aortic valve regurgitation is mild to  moderate. Mild aortic valve sclerosis is present, with no evidence of  aortic valve stenosis.   CXR 3/28: Pulmonary scarring and fibrosis. No superimposed airspace disease.  CT chest w/ contrast on 3/28: 1. Spectrum of findings compatible with basilar predominant fibrotic interstitial lung disease with mild honeycombing, progressed since 2008 chest CT, considered diagnostic of usual interstitial pneumonia (UIP). Mild cardiomegaly. Contrast reflux into the IVC and hepatic veins, suggesting elevated right heart pressures. Two-vessel coronary atherosclerosis. Small hiatal hernia. Cholelithiasis. Aortic Atherosclerosis (ICD10-I70.0) and Emphysema (ICD10-J43.9).  CT head w/o contrast 3/28: No acute intracranial abnormality.  Micro Data:  COVID 3/28> neg Blood Cx 3/28> NGTD @ 48 hr UCx 3/28>  >=100,000 E. Coli  Antimicrobials:  Ceftriaxone 3/28>  Interim history/subjective:  Intubated, continues to move extremities spontaneously. Response appropriately by nodding and blinking. Appears to be more interactive today with family and care team. SBT attempted and patient was extubated in am.    Objective   Blood pressure (!) 107/56, pulse 67, temperature 98.5 F (36.9 C), temperature source Axillary, resp. rate 14, height 5' 10.5" (1.791 m), weight 66.3 kg, SpO2 100 %.    Vent Mode: PRVC FiO2 (%):  [40 %] 40 % Set Rate:  [15 bmp] 15 bmp Vt Set:  [600 mL] 600 mL PEEP:  [5  cmH20] 5 cmH20 Plateau Pressure:  [16 cmH20-22 cmH20] 22 cmH20   Intake/Output Summary (Last 24 hours) at  09/26/2019 0742 Last data filed at 09/26/2019 [id number] Gross per 24 hour  Intake 245.13 ml  Output 375 ml  Net -129.87 ml   Filed Weights   09/25/19 0600 09/25/19 0738 09/26/19 0600  Weight: 68.2 kg 68.2 kg 66.3 kg    Examination: General: Elderly male who appears to be alert, NAD HENT: EOMI, Anicteric sclera Lungs: Recently extubated. Lungs CTAB b/l, normal work of breathing Cardiovascular: Intermittent Ectopy, otherwise sinus brady. No murmurs or gallops Abdomen: Soft, nondistended Extremities: 1+ pitting edema Neuro: Arousable, blinks and nods head to communicate, able to squeeze fingers on questioning, speech coherent, no gross abnormalities GU: Foley in place, adequate urine output, amber in color.   Assessment & Plan:   Though critically ill previously, condition appears to have improved. Able to maintain adequate tidal volumes on pressure support. Chest x-ray unchanged from baseline. - Patient Extubated  - Will continue to monitor  Cardiac arrest: v tach with defibrillation. Normotensive presently. Previous EKG with a fib, now mostly in sinus tach. QTc up 498 from 483. Other QTc prolonging medications still on board. Troponins downtrended.  --No longer trending Troponins --Cardiology following --Discontinue Lexapro --Hold Donepezil qhs tonight and reassess QTc --Replete electrolytes for goal K>4 and Mg>2  UTI: Leukocytosis continues to improve, WBC now wnl --Continue ceftriaxone q24h for 4 days, will d/c on 4/2 --Follow up urine and blood cultures  Atrial fibrillation: --Hold home anticoagulation. Continue Heparin gtt today, possibly transition to Xarelto in am.  --Continue home diltiazem  AKI: Cr now stable at 1.15 from 1.1, maintains adequate urine output --CTM  Chronic Conditions:  Hyponatremia:  --Monitor Na. Improving. Mental status stable. Na currently 134 from 137  Chronic anemia: Hemoglobin 10.8 from 13.3, will CTM  COPD: Continue albuterol as  needed  Dementia: Hold Aricept   Hypothyroidism: Continue home Synthroid  BPH: Continue home Flomax  Best practice:  Diet: Advance as tolerated Pain/Anxiety/Delirium protocol (if indicated): Fentanyl gtt d/c'd on 3/29, pain well controlled.  VAP protocol (if indicated): Ordered DVT prophylaxis: Heparin gtt GI prophylaxis: PPI Glucose control: Monitor Mobility: PT when able Code Status: Full Family Communication: Discussed plan with son and daughter-in-law Disposition: ICU  Labs   CBC: Recent Labs  Lab 09/04/2019 2252 09/24/19 2103 09/24/19 2138 09/25/19 0435  WBC 17.0* 14.0*  --  13.6*  NEUTROABS 13.9*  --   --  11.1*  HGB 13.0 12.6* 11.6* 13.3  HCT 37.3* 37.7* 34.0* 39.7  MCV 98.2 102.4*  --  101.0*  PLT 222 236  --  [id number]    Basic Metabolic Panel: Recent Labs  Lab 09/12/2019 2252 09/24/19 2103 09/24/19 2138 09/25/19 0435  NA 131* 133* 135 135  K 4.0 3.7 3.5 4.6  CL 98 103  --  102  CO2 20* 18*  --  21*  GLUCOSE 141* 130*  --  124*  BUN 18 19  --  20  CREATININE 1.21 1.26*  --  1.11  CALCIUM 8.8* 8.2*  --  8.6*  MG  --  1.9  --  2.4  PHOS  --   --   --  3.7   GFR: Estimated Creatinine Clearance: 40.6 mL/min (by C-G formula based on SCr of 1.11 mg/dL). Recent Labs  Lab 09/27/2019 2252 09/24/19 0350 09/24/19 0545 09/24/19 2103 09/25/19 0435  WBC 17.0*  --   --  14.0* 13.6*  LATICACIDVEN  --  2.1* 2.3*  --  2.6*    Liver Function Tests: Recent Labs  Lab 09/07/2019 2252 09/24/19 2103 09/25/19 0435  AST 175* 123* 93*  ALT 37 74* 68*  ALKPHOS 46 47 49  BILITOT 0.9 1.2 0.9  PROT 6.6 5.8* 6.5  ALBUMIN 2.7* 2.3* 2.6*    ABG    Component Value Date/Time   PHART 7.340 (L) 09/24/2019 2138   PCO2ART 38.7 09/24/2019 2138   PO2ART 402.0 (H) 09/24/2019 2138   HCO3 20.7 09/25/2019 0510   TCO2 22 09/24/2019 2138   ACIDBASEDEF 3.9 (H) 09/25/2019 0510   O2SAT 74.3 09/25/2019 0510     Coagulation Profile: Recent Labs  Lab 09/26/2019 2252  INR 3.1*     CBG: Recent Labs  Lab 09/24/19 2041 09/24/19 2136  GLUCAP 132* 124*    CRITICAL CARE  Note initiated by Carolan Shiver. Meda Coffee, MS4.

## 2019-09-26 NOTE — Progress Notes (Signed)
ANTICOAGULATION CONSULT NOTE   Pharmacy Consult for Heparin (Xarelto on hold) Indication: atrial fibrillation  No Known Allergies  Patient Measurements: 69.5kg  Vital Signs: Temp: 99.2 F (37.3 C) (03/30 0840) Temp Source: Axillary (03/30 0840) BP: 106/52 (03/30 0900) Pulse Rate: 61 (03/30 0900)  Labs: Recent Labs    09/13/2019 2252 09/22/2019 2252 09/24/19 2103 09/24/19 2103 09/24/19 2138 09/24/19 2138 09/24/19 2346 09/25/19 0435 09/26/19 0047 09/26/19 0940  HGB 13.0   < > 12.6*   < > 11.6*   < >  --  13.3  --  10.8*  HCT 37.3*   < > 37.7*   < > 34.0*  --   --  39.7  --  31.8*  PLT 222   < > 236  --   --   --   --  217  --  193  APTT  --   --   --   --   --   --   --  28 65* 64*  LABPROT 31.9*  --   --   --   --   --   --   --   --   --   INR 3.1*  --   --   --   --   --   --   --   --   --   HEPARINUNFRC  --   --   --   --   --   --   --  >2.20* 1.56*  --   CREATININE 1.21   < > 1.26*  --   --   --   --  1.11  --  1.15  TROPONINIHS  --   --  [id number]*  --   --   --  9,437*  --   --   --    < > = values in this interval not displayed.    Estimated Creatinine Clearance: 39.2 mL/min (by C-G formula based on SCr of 1.15 mg/dL).   Medical History: Past Medical History:  Diagnosis Date  . Arthritis   . CHF (congestive heart failure) ([place])   . COPD (chronic obstructive pulmonary disease) ([place])   . Dysrhythmia    atrial fibrilation  . GERD (gastroesophageal reflux disease)   . Pulmonary fibrosis [facility])      Assessment: 86 y/o M with recent hospital stay for facial cellulitis. Presented back to the ED with worsening lethargy/falls. Late yesterday evening he experienced cardiac arrest with ROSC. He is on Xarelto PTA for atrial fibrillation. This will be held for now and the patient will be started on a heparin drip in the meantime. Last dose of Xarelto was given in the hospital on 3/28 at 1711.   APTT this morning is slightly subtherapeutic at 64 seconds on 900  units/hour. APTT was drawn one hour early from his R foreram, heparin infusing in L PIV. H&H is decreased to 10.8/31.8, plts wnl at 193. No signs of bleeding noted. Creatinine remains around 1.1-1.2, today at 1.15 with an eCrCl ~39 ml/min and some urine output.   Goal of Therapy:  Heparin level 0.3-0.7 units/ml aPTT 66-102 seconds Monitor platelets by anticoagulation protocol: Yes   Plan:  -Slightly increase heparin infusion to 950 units/hr  -8 hour aPTT -Daily CBC. Daily heparin level and aPTT until correlating.  -Monitor closely for bleeding -Follow up when to resume PTA rivaroxaban    Thank you,   Eddie Candle, PharmD PGY-1 Pharmacy Resident   Please check amion  for clinical pharmacist contact number

## 2019-09-27 DIAGNOSIS — I5032 Chronic diastolic (congestive) heart failure: Secondary | ICD-10-CM

## 2019-09-27 DIAGNOSIS — I4891 Unspecified atrial fibrillation: Secondary | ICD-10-CM | POA: Diagnosis not present

## 2019-09-27 DIAGNOSIS — I2510 Atherosclerotic heart disease of native coronary artery without angina pectoris: Secondary | ICD-10-CM | POA: Diagnosis not present

## 2019-09-27 DIAGNOSIS — E039 Hypothyroidism, unspecified: Secondary | ICD-10-CM | POA: Diagnosis not present

## 2019-09-27 DIAGNOSIS — F0390 Unspecified dementia without behavioral disturbance: Secondary | ICD-10-CM | POA: Diagnosis not present

## 2019-09-27 DIAGNOSIS — I469 Cardiac arrest, cause unspecified: Secondary | ICD-10-CM | POA: Diagnosis not present

## 2019-09-27 LAB — CBC
HCT: 31.8 % — ABNORMAL LOW (ref 39.0–52.0)
Hemoglobin: 10.7 g/dL — ABNORMAL LOW (ref 13.0–17.0)
MCH: 34 pg (ref 26.0–34.0)
MCHC: 33.6 g/dL (ref 30.0–36.0)
MCV: 101 fL — ABNORMAL HIGH (ref 80.0–100.0)
Platelets: 192 10*3/uL (ref 150–400)
RBC: 3.15 MIL/uL — ABNORMAL LOW (ref 4.22–5.81)
RDW: 13.4 % (ref 11.5–15.5)
WBC: 9.6 10*3/uL (ref 4.0–10.5)
nRBC: 0 % (ref 0.0–0.2)

## 2019-09-27 LAB — BASIC METABOLIC PANEL
Anion gap: 12 (ref 5–15)
BUN: 24 mg/dL — ABNORMAL HIGH (ref 8–23)
CO2: 21 mmol/L — ABNORMAL LOW (ref 22–32)
Calcium: 8.3 mg/dL — ABNORMAL LOW (ref 8.9–10.3)
Chloride: 103 mmol/L (ref 98–111)
Creatinine, Ser: 1.2 mg/dL (ref 0.61–1.24)
GFR calc Af Amer: >60 mL/min (ref >60)
GFR calc non Af Amer: 53 mL/min — ABNORMAL LOW (ref >60)
Glucose, Bld: 88 mg/dL (ref 70–99)
Potassium: 3.8 mmol/L (ref 3.5–5.1)
Sodium: 136 mmol/L (ref 135–145)

## 2019-09-27 LAB — APTT: aPTT: 62 seconds — ABNORMAL HIGH (ref 24–36)

## 2019-09-27 LAB — HEPARIN LEVEL (UNFRACTIONATED): Heparin Unfractionated: 0.93 IU/mL — ABNORMAL HIGH (ref 0.30–0.70)

## 2019-09-27 LAB — MAGNESIUM: Magnesium: 2.3 mg/dL (ref 1.7–2.4)

## 2019-09-27 MED ORDER — RIVAROXABAN 15 MG PO TABS
15.0000 mg | ORAL_TABLET | Freq: Every day | ORAL | Status: DC
  Administered 2019-09-27: 15 mg via ORAL
  Filled 2019-09-27 (×2): qty 1

## 2019-09-27 MED ORDER — CHLORHEXIDINE GLUCONATE CLOTH 2 % EX PADS
6.0000 | MEDICATED_PAD | Freq: Every day | CUTANEOUS | Status: DC
  Administered 2019-09-29 – 2019-10-01 (×3): 6 via TOPICAL

## 2019-09-27 MED ORDER — SODIUM CHLORIDE 0.9 % IV SOLN: 250.0000 mL | INTRAVENOUS | Status: DC | PRN

## 2019-09-27 MED ORDER — ENSURE ENLIVE PO LIQD
237.0000 mL | Freq: Three times a day (TID) | ORAL | Status: DC
  Administered 2019-09-28 – 2019-09-30 (×6): 237 mL via ORAL

## 2019-09-27 MED ORDER — SODIUM CHLORIDE 0.9% FLUSH
3.0000 mL | Freq: Two times a day (BID) | INTRAVENOUS | Status: DC
  Administered 2019-09-27: 3 mL via INTRAVENOUS

## 2019-09-27 MED ORDER — SODIUM CHLORIDE 0.9 % WEIGHT BASED INFUSION
1.0000 mL/kg/h | INTRAVENOUS | Status: DC
  Administered 2019-09-28: 05:00:00 1 mL/kg/h via INTRAVENOUS

## 2019-09-27 MED ORDER — SODIUM CHLORIDE 0.9% FLUSH: 3.0000 mL | INTRAVENOUS | Status: DC | PRN

## 2019-09-27 MED ORDER — PANTOPRAZOLE SODIUM 40 MG PO TBEC
40.0000 mg | DELAYED_RELEASE_TABLET | Freq: Every day | ORAL | Status: DC
  Administered 2019-09-27 – 2019-10-02 (×4): 40 mg via ORAL
  Filled 2019-09-27 (×5): qty 1

## 2019-09-27 MED ORDER — TAMSULOSIN HCL 0.4 MG PO CAPS
0.4000 mg | ORAL_CAPSULE | Freq: Every day | ORAL | Status: DC
  Administered 2019-09-27 – 2019-10-02 (×6): 0.4 mg via ORAL
  Filled 2019-09-27 (×6): qty 1

## 2019-09-27 MED ORDER — SODIUM CHLORIDE 0.9 % WEIGHT BASED INFUSION
75.0000 mL/h | INTRAVENOUS | Status: DC
  Administered 2019-09-28: 04:00:00 75 mL/h via INTRAVENOUS

## 2019-09-27 MED ORDER — CYANOCOBALAMIN 500 MCG PO TABS
500.0000 ug | ORAL_TABLET | ORAL | Status: DC
  Administered 2019-10-02: 500 ug via ORAL
  Filled 2019-09-27 (×2): qty 1

## 2019-09-27 MED ORDER — VITAMIN B-12 500 MCG PO TABS: 500.0000 ug | ORAL_TABLET | ORAL | Status: DC

## 2019-09-27 MED ORDER — POTASSIUM CHLORIDE 20 MEQ/15ML (10%) PO SOLN
20.0000 meq | Freq: Once | ORAL | Status: AC
  Administered 2019-09-27: 20 meq via ORAL
  Filled 2019-09-27: qty 15

## 2019-09-27 MED ORDER — RIVAROXABAN 15 MG PO TABS: 15.0000 mg | ORAL_TABLET | Freq: Every day | ORAL | Status: DC

## 2019-09-27 MED ORDER — ASPIRIN 81 MG PO CHEW
81.0000 mg | CHEWABLE_TABLET | ORAL | Status: AC
  Administered 2019-09-28: 07:00:00 81 mg via ORAL
  Filled 2019-09-27: qty 1

## 2019-09-27 MED ORDER — DILTIAZEM HCL ER COATED BEADS 120 MG PO CP24
120.0000 mg | ORAL_CAPSULE | Freq: Every day | ORAL | Status: DC
  Administered 2019-09-28: 12:00:00 120 mg via ORAL
  Filled 2019-09-27 (×2): qty 1

## 2019-09-27 NOTE — Progress Notes (Signed)
Chaplain engaged in follow-up visit with Cameron [name].  Chaplain offered ministry of presence and prayer to [name] and family.  Chaplain will follow-up as needed.

## 2019-09-27 NOTE — Progress Notes (Signed)
Nutrition Follow up  DOCUMENTATION CODES:   Severe malnutrition in context of chronic illness  INTERVENTION:    Ensure Enlive po BID, each supplement provides 350 kcal and 20 grams of protein  Magic cup BID with meals, each supplement provides 290 kcal and 9 grams of protein  MVI daily  NUTRITION DIAGNOSIS:   Severe Malnutrition related to chronic illness(COPD) as evidenced by severe fat depletion, severe muscle depletion, energy intake < or equal to 75% for > or equal to 1 month.  Ongoing  GOAL:   Patient will meet greater than or equal to 90% of their needs   Diet just advanced   MONITOR:   Diet advancement, Vent status, Skin, TF tolerance, Weight trends, Labs, I & O's  REASON FOR ASSESSMENT:   Consult Assessment of nutrition requirement/status  ASSESSMENT:   Patient with PMH significant for GERD, COPD, CHF, and CAD. Recently admitted 3/10-3/18 for facial cellulitis. Presents this admission with UTI.   3/28- vtach arrest, intubated  3/30- extubated  Pt discussed during ICU rounds and with RN.   Diet advanced to regular this am. Eating yogurt at bedside. No meal intakes documented as this time. Amendable to Ensure this admission.   Admission weight: 68.2 kg  Current weight: 64.3 kg   I/O: +2,119 ml since admit UOP: 510 ml x 24 hrs   Medications: Vit B12 Labs: reviewed   Diet Order:   Diet Order            Diet regular Room service appropriate? Yes; Fluid consistency: Thin  Diet effective now              EDUCATION NEEDS:   Not appropriate for education at this time  Skin:  Skin Assessment: Reviewed RN Assessment  Last BM:  3/29  Height:   Ht Readings from Last 1 Encounters:  09/25/19 5' 10.5" (1.791 m)    Weight:   Wt Readings from Last 1 Encounters:  09/27/19 64.3 kg    BMI:  Body mass index is 20.05 kg/m.  Estimated Nutritional Needs:   Kcal:  1800-2000 kcal  Protein:  90-105 grams  Fluid:  >/= 1.8 L/day   Mariana Single RD, LDN Clinical Nutrition Pager listed in [place]

## 2019-09-27 NOTE — Evaluation (Signed)
Clinical/Bedside Swallow Evaluation Patient Details  Name: Cameron [name] MRN: [id number] Date of Birth: September 03, 1925  Today's Date: 09/27/2019 Time: SLP Start Time (ACUTE ONLY): 1017 SLP Stop Time (ACUTE ONLY): 1035 SLP Time Calculation (min) (ACUTE ONLY): 18 min  Past Medical History:  Past Medical History:  Diagnosis Date  . Arthritis   . CHF (congestive heart failure) ([place])   . COPD (chronic obstructive pulmonary disease) ([place])   . Dysrhythmia    atrial fibrilation  . GERD (gastroesophageal reflux disease)   . Pulmonary fibrosis ([place])    Past Surgical History:  Past Surgical History:  Procedure Laterality Date  . hemmroidectmy    . HERNIA REPAIR    . LEFT HEART CATHETERIZATION WITH CORONARY ANGIOGRAM N/A 10/07/2012   Procedure: LEFT HEART CATHETERIZATION WITH CORONARY ANGIOGRAM, possible PCI;  Surgeon: Jettie Booze, MD;  Location: [facility] C[facility] LAB;  Service: Cardiovascular;  Laterality: N/A;  . TONSILLECTOMY     HPI:  Pt is a 86 year old male with past medical history of GERD, COPD, chronic Afib, hiatal hernia, and diastolic CHF  who presented to the ED after exhibiting a several day history of progressively worsening lethargy and confusion diagnosed with acute encephalopathy and found to have UTI. Vtach arrest on 3/28; CPR 4 mins; ETT 3/28-3/30 at 11:20.    Assessment / Plan / Recommendation Clinical Impression  Pt was seen for bedside swallow evaluation with his daughter present. Pt denied a history of dysphagia symptoms and reported that he has been consuming a regular texture diet and thin liquids prior to admission. Pt was extubated on 3/30 and was started on a regular texture diet.  Pt's daughter and nursing reported that the pt has been tolerating thin liquids without overt s/sx of aspiration. Oral mechanism exam revealed mild lingual weakness, and a hoarse vocal quality with reduced vocal intensity which was reported to be ~50% back to baseline. Pt exhibited throat  clearing and coughing with 2/5 boluses of puree solids but tolerated all other solids and liquids without overt s/sx of aspiration. Mastication time was within functional limits. Mild oral residue was noted with large boluses of regular texture and dysphagia 3 solids but was cleared with a liquid wash. It is recommended that a regular texture diet and thin liquids be continued and SLP will follow to ensure diet tolerance.  SLP Visit Diagnosis: Dysphagia, oropharyngeal phase (R13.12)    Aspiration Risk  Mild aspiration risk    Diet Recommendation Regular;Thin liquid   Liquid Administration via: Cup;Straw Medication Administration: Whole meds with liquid Supervision: Staff to assist with self feeding Compensations: Slow rate;Small sips/bites;Follow solids with liquid Postural Changes: Seated upright at 90 degrees    Other  Recommendations Oral Care Recommendations: Oral care BID   Follow up Recommendations None      Frequency and Duration min 2x/week  1 week       Prognosis Prognosis for Safe Diet Advancement: Good      Swallow Study   General Date of Onset: 09/26/19 HPI: Pt is a 86 year old male with past medical history of GERD, COPD, chronic Afib, hiatal hernia, and diastolic CHF  who presented to the ED after exhibiting a several day history of progressively worsening lethargy and confusion diagnosed with acute encephalopathy and found to have UTI. Vtach arrest on 3/28; CPR 4 mins; ETT 3/28-3/30 at 11:20.  Type of Study: Bedside Swallow Evaluation Previous Swallow Assessment: None Diet Prior to this Study: Regular;Thin liquids Temperature Spikes Noted: No Respiratory Status: Nasal cannula History of  Recent Intubation: Yes Length of Intubations (days): 2 days Date extubated: 09/26/19 Behavior/Cognition: Alert;Cooperative;Pleasant mood Oral Cavity Assessment: Within Functional Limits Oral Care Completed by SLP: No Oral Cavity - Dentition: Adequate natural dentition Vision:  Functional for self-feeding Self-Feeding Abilities: Needs assist Patient Positioning: Upright in bed;Postural control adequate for testing Baseline Vocal Quality: Hoarse;Low vocal intensity Volitional Cough: Weak Volitional Swallow: Able to elicit    Oral/Motor/Sensory Function Overall Oral Motor/Sensory Function: Mild impairment Facial ROM: Within Functional Limits Facial Symmetry: Within Functional Limits Facial Strength: Within Functional Limits Facial Sensation: Within Functional Limits Lingual ROM: Within Functional Limits Lingual Symmetry: Within Functional Limits Lingual Strength: Reduced Lingual Sensation: Within Functional Limits Velum: Within Functional Limits Mandible: Within Functional Limits   Ice Chips Ice chips: Within functional limits Presentation: Spoon   Thin Liquid Thin Liquid: Within functional limits Presentation: Straw;Spoon    Nectar Thick Nectar Thick Liquid: Not tested   Honey Thick Honey Thick Liquid: Not tested   Puree Puree: Impaired Presentation: Spoon Pharyngeal Phase Impairments: Cough - Delayed;Throat Clearing - Immediate Other Comments: (With 2/5 boluses)   Solid     Solid: Impaired Presentation: Self Fed Oral Phase Impairments: Impaired mastication Oral Phase Functional Implications: Oral residue(Mild cleared with thin liquids/ secondary swallows)     Cameron [name] I. [name], [place], [place] Office number [phone] Pager [phone]  [name] 09/27/2019,12:20 PM

## 2019-09-27 NOTE — Evaluation (Signed)
Physical Therapy Evaluation Patient Details Name: Cameron [name] MRN: [id number] DOB: 27-Nov-1925 Today's Date: 09/27/2019   History of Present Illness  86 year old male with past medical history of gastroesophageal reflux disease, COPD, chronic atrial fibrillation on anticoagulation, diastolic congestive heart failure and recent admission to [facility] for odontogenic facial cellulitis who presents to [facility] emergency department after exhibiting a several day history of progressively worsening lethargy and confusion.  Clinical Impression  Pt admitted with above diagnosis. Pt was able to ambulate in room with RW with mod assist of 2 with assist to steer with pt shuffling feet at times.  Pt fatigues quickly and will need to return to SNF to receive therapy prior to d/c back to his A living.   Pt currently with functional limitations due to the deficits listed below (see PT Problem List). Pt will benefit from skilled PT to increase their independence and safety with mobility to allow discharge to the venue listed below.      Follow Up Recommendations SNF;Supervision/Assistance - 24 hour    Equipment Recommendations  Rolling walker with 5" wheels    Recommendations for Other Services       Precautions / Restrictions Precautions Precautions: Fall Precaution Comments: posterior right bias Restrictions Weight Bearing Restrictions: No      Mobility  Bed Mobility Overal bed mobility: Needs Assistance Bed Mobility: Supine to Sit     Supine to sit: Mod assist     General bed mobility comments: mod A to guide BLEs to EOB and support trunk into sitting. Increased time and cueing needed for initiation  Transfers Overall transfer level: Needs assistance Equipment used: Rolling walker (2 wheeled) Transfers: Sit to/from Stand Sit to Stand: Min assist;Mod assist;+2 physical assistance;+2 safety/equipment         General transfer comment: min A +2 to rise and  steady, mod A +2 due to posterior R bias with standing and management of general deconditioning, lines  Ambulation/Gait Ambulation/Gait assistance: Mod assist;+2 physical assistance Gait Distance (Feet): 45 Feet Assistive device: Rolling walker (2 wheeled) Gait Pattern/deviations: Step-through pattern;Decreased stride length;Trunk flexed;Shuffle   Gait velocity interpretation: <1.8 ft/sec, indicate of risk for recurrent falls General Gait Details: pt with posterior right lean with physical assist for balance and pt with initiation of shuffling gait with mod cues for increased stride. pt then able to take a few larger steps then returns to shuffling gait. several standing pauses with decreased activity tolerance. Assist to steer RW as well.   Stairs            Wheelchair Mobility    Modified Rankin (Stroke Patients Only)       Balance Overall balance assessment: Needs assistance Sitting-balance support: Bilateral upper extremity supported;Feet supported Sitting balance-Leahy Scale: Poor Sitting balance - Comments: intermittment mod A support, pt would lean back unsafely without warning and states "I am relaxing"   Standing balance support: Bilateral upper extremity supported Standing balance-Leahy Scale: Poor Standing balance comment: external support from therapist and RW to maintain static and dynamic standing                             Pertinent Vitals/Pain Pain Assessment: No/denies pain    Home Living Family/patient expects to be discharged to:: Assisted living     Type of Home: Apartment Home Access: Level entry     Home Layout: One level Home Equipment: Walker - 2 wheels;Cane - single point;Shower seat;Grab  bars - tub/shower;Grab bars - toilet Additional Comments: Was at [facility] with therapy prior to this admit.      Prior Function Level of Independence: Needs assistance   Gait / Transfers Assistance Needed: pt reports he walks without AD  to dining hall  ADL's / Homemaking Assistance Needed: pt reports he performs ADLs without assist and facility does IADL  Comments: Had a trainer 1 x week     Hand Dominance   Dominant Hand: Right    Extremity/Trunk Assessment   Upper Extremity Assessment Upper Extremity Assessment: Defer to OT evaluation    Lower Extremity Assessment Lower Extremity Assessment: Generalized weakness    Cervical / Trunk Assessment Cervical / Trunk Assessment: Kyphotic  Communication   Communication: HOH  Cognition Arousal/Alertness: Awake/alert Behavior During Therapy: Flat affect Overall Cognitive Status: Impaired/Different from baseline Area of Impairment: Memory;Safety/judgement;Problem solving                 Orientation Level: Time   Memory: Decreased short-term memory   Safety/Judgement: Decreased awareness of deficits;Decreased awareness of safety   Problem Solving: Slow processing;Difficulty sequencing;Requires verbal cues;Requires tactile cues General Comments: increased time, cueing, and repitition needed for simple commands      General Comments General comments (skin integrity, edema, etc.): 85 bpm, 100% 3LO2, 100/57    Exercises     Assessment/Plan    PT Assessment Patient needs continued PT services  PT Problem List Decreased strength;Decreased mobility;Decreased safety awareness;Decreased activity tolerance;Decreased balance;Decreased knowledge of use of DME;Cardiopulmonary status limiting activity       PT Treatment Interventions DME instruction;Therapeutic exercise;Gait training;Balance training;Functional mobility training;Therapeutic activities;Patient/family education    PT Goals (Current goals can be found in the Care Plan section)  Acute Rehab PT Goals Patient Stated Goal: return to independence PT Goal Formulation: With patient Time For Goal Achievement: 10/11/19 Potential to Achieve Goals: Fair    Frequency Min 2X/week   Barriers to  discharge        Co-evaluation PT/OT/SLP Co-Evaluation/Treatment: Yes Reason for Co-Treatment: Complexity of the patient's impairments (multi-system involvement);For patient/therapist safety PT goals addressed during session: Mobility/safety with mobility         AM-PAC PT "6 Clicks" Mobility  Outcome Measure Help needed turning from your back to your side while in a flat bed without using bedrails?: A Lot Help needed moving from lying on your back to sitting on the side of a flat bed without using bedrails?: A Lot Help needed moving to and from a bed to a chair (including a wheelchair)?: A Lot Help needed standing up from a chair using your arms (e.g., wheelchair or bedside chair)?: A Lot Help needed to walk in hospital room?: A Lot Help needed climbing 3-5 steps with a railing? : A Lot 6 Click Score: 12    End of Session Equipment Utilized During Treatment: Gait belt;Oxygen Activity Tolerance: Patient limited by fatigue Patient left: in chair;with chair alarm set;with call bell/phone within reach;with family/visitor present Nurse Communication: Mobility status;Precautions PT Visit Diagnosis: Other abnormalities of gait and mobility (R26.89);Difficulty in walking, not elsewhere classified (R26.2)    Time: [id number] PT Time Calculation (min) (ACUTE ONLY): 37 min   Charges:   PT Evaluation $PT Eval Moderate Complexity: 1 Mod          Tiffini Blacksher W,PT Acute Rehabilitation Services Pager:  [phone]  Office:  [phone]    Denice Paradise 09/27/2019, 1:32 PM

## 2019-09-27 NOTE — Evaluation (Addendum)
Occupational Therapy Evaluation Patient Details Name: Cameron [name] MRN: [id number] DOB: Nov 05, 1925 Today's Date: 09/27/2019    History of Present Illness 86 year old male with past medical history of gastroesophageal reflux disease, COPD, chronic atrial fibrillation on anticoagulation, diastolic congestive heart failure and recent admission to [facility] for odontogenic facial cellulitis who presents to [facility] emergency department after exhibiting a several day history of progressively worsening lethargy and confusion.   Clinical Impression   PTA pt at rehab and with recent admissions to hospital. Originally he is from ALF and independent with BADL. At time of eval, pt requires mod A for bed mobility and min-mod A +2 to rise and steady with RW. Increased time, cues, and repetition needed to follow basic commands and attend to safety/defcitis due to cognitive deficits. Pt able to complete room level functional mobility with mod A +2 and RW to recliner, fatigues easily. Pt completed LB dressing with mod A; able to complete figure 4 method but needing increased assist to bimanually coordinate to pull sock up. Recommend SNF at d/c for continues progression of BADL and functional mobility. Family is wishing to pursue a different SNF. Will continue to follow per POC listed below. **Evaluation completed prior to cancel order being placed**    Follow Up Recommendations  SNF;Supervision/Assistance - 24 hour(family wants to find new facility)    Equipment Recommendations  None recommended by OT    Recommendations for Other Services       Precautions / Restrictions Precautions Precautions: Fall Restrictions Weight Bearing Restrictions: No      Mobility Bed Mobility Overal bed mobility: Needs Assistance Bed Mobility: Supine to Sit     Supine to sit: Mod assist     General bed mobility comments: mod A to guide BLEs to EOB and support trunk into sitting. Increased  time and cueing needed for initiation  Transfers Overall transfer level: Needs assistance Equipment used: Rolling walker (2 wheeled) Transfers: Sit to/from Stand Sit to Stand: Min assist;Mod assist;+2 physical assistance;+2 safety/equipment         General transfer comment: min A +2 to rise and steady, mod A +2 due to posterior R bias with standing and management of general deconditioning, lines    Balance Overall balance assessment: Needs assistance Sitting-balance support: Bilateral upper extremity supported;Feet supported Sitting balance-Leahy Scale: Poor Sitting balance - Comments: intermittment mod A support, pt would lean back unsafely without warning and states "I am relaxing"   Standing balance support: Bilateral upper extremity supported Standing balance-Leahy Scale: Poor Standing balance comment: external support from therapist and RW to maintain static and dynamic standing                           ADL either performed or assessed with clinical judgement   ADL Overall ADL's : Needs assistance/impaired Eating/Feeding: Independent;Sitting   Grooming: Minimal assistance;Standing Grooming Details (indicate cue type and reason): fatigues quickly, requires external support Upper Body Bathing: Min guard;Sitting   Lower Body Bathing: Minimal assistance;Sit to/from stand;Sitting/lateral leans   Upper Body Dressing : Set up;Sitting   Lower Body Dressing: Moderate assistance;Sitting/lateral leans Lower Body Dressing Details (indicate cue type and reason): to don socks; able to perform figure 4 with BLEs but needing sock started on foot, not able to use B hands Toilet Transfer: Moderate assistance;+2 for safety/equipment;Ambulation;Minimal assistance Toilet Transfer Details (indicate cue type and reason): min-mod A to rise and steady with RW for safety Toileting- Clothing Manipulation  and Hygiene: Moderate assistance;Sit to/from stand       Functional mobility  during ADLs: Moderate assistance;Rolling walker;Cueing for safety       Vision Baseline Vision/History: Wears glasses Wears Glasses: At all times Patient Visual Report: No change from baseline       Perception     Praxis      Pertinent Vitals/Pain Pain Assessment: No/denies pain     Hand Dominance Right   Extremity/Trunk Assessment Upper Extremity Assessment Upper Extremity Assessment: Generalized weakness   Lower Extremity Assessment Lower Extremity Assessment: Generalized weakness       Communication Communication Communication: HOH   Cognition Arousal/Alertness: Awake/alert Behavior During Therapy: Flat affect Overall Cognitive Status: Impaired/Different from baseline Area of Impairment: Memory;Safety/judgement;Problem solving                     Memory: Decreased short-term memory   Safety/Judgement: Decreased awareness of deficits;Decreased awareness of safety   Problem Solving: Slow processing;Difficulty sequencing;Requires verbal cues;Requires tactile cues General Comments: increased time, cueing, and repitition needed for simple commands   General Comments  85 bpm, 100% 3LO2, 100/57    Exercises     Shoulder Instructions      Home Living Family/patient expects to be discharged to:: Assisted living     Type of Home: Apartment Home Access: Level entry     Home Layout: One level     Bathroom Shower/Tub: Occupational psychologist: Handicapped height     Home Equipment: Environmental consultant - 2 wheels;Cane - single point;Shower seat;Grab bars - tub/shower;Grab bars - toilet   Additional Comments: Was at [facility] with therapy prior to this admit.        Prior Functioning/Environment Level of Independence: Needs assistance  Gait / Transfers Assistance Needed: pt reports he walks without AD to dining hall ADL's / Homemaking Assistance Needed: pt reports he performs ADLs without assist and facility does IADL   Comments: Had a trainer  1 x week        OT Problem List: Decreased activity tolerance;Impaired balance (sitting and/or standing);Decreased safety awareness;Decreased knowledge of use of DME or AE;Decreased strength;Cardiopulmonary status limiting activity;Decreased cognition      OT Treatment/Interventions: Self-care/ADL training;Therapeutic exercise;DME and/or AE instruction;Therapeutic activities;Patient/family education;Balance training;Energy conservation;Cognitive remediation/compensation    OT Goals(Current goals can be found in the care plan section) Acute Rehab OT Goals Patient Stated Goal: return to independence OT Goal Formulation: With patient/family Time For Goal Achievement: 10/11/19 Potential to Achieve Goals: Good  OT Frequency: Min 2X/week   Barriers to D/C:            Co-evaluation              AM-PAC OT "6 Clicks" Daily Activity     Outcome Measure Help from another person eating meals?: None Help from another person taking care of personal grooming?: A Little Help from another person toileting, which includes using toliet, bedpan, or urinal?: A Lot Help from another person bathing (including washing, rinsing, drying)?: A Lot Help from another person to put on and taking off regular upper body clothing?: A Little Help from another person to put on and taking off regular lower body clothing?: A Lot 6 Click Score: 16   End of Session Equipment Utilized During Treatment: Gait belt;Rolling walker Nurse Communication: Mobility status  Activity Tolerance: Patient tolerated treatment well Patient left: in chair;with call bell/phone within reach;with chair alarm set;with family/visitor present  OT Visit Diagnosis: Unsteadiness on feet (R26.81);Other  abnormalities of gait and mobility (R26.89);Muscle weakness (generalized) (M62.81);Other symptoms and signs involving cognitive function                Time: 1110-1145 OT Time Calculation (min): 35 min Charges:  OT General Charges $OT  Visit: 1 Visit OT Evaluation $OT Eval Moderate Complexity: [place], MSOT, OTR/L [facility] Office Number: [phone] Pager: [phone]  Zenovia Jarred 09/27/2019, 1:09 PM

## 2019-09-27 NOTE — Progress Notes (Signed)
PROGRESS NOTE  Cameron [name] [id number] DOB: July 14, 1925 DOA: 09/01/2019 PCP: Lavone Orn, MD  HPI/Recap of past 24 hours: HPI from PCCM 86 year old man with history of HFpEF, COPD, atrial fibrillation on Xarelto, CAD, pulmonary fibrosis, GERD, hypothyroidism, recent hospitalization from 3/10-3/18 for facial cellulitis and hyponatremia presenting again with acute encephalopathy.  He has been having worse lethargy over the past few days.  There have been some reported falls at the facility.  Upon arrival to [facility] ED, he was found to have a UTI and started on antibiotics.  On evening of 3/28 he was found to be in pulseless Vtach.  CPR was initiated.  He was defibrillated x1.  ROSC was achieved after 4 minutes.  He had agonal breathing and was intubated extubated on 09/26/2019. Was previously on Lexapro, however given increase in Qtc >500 and Vtach, Lexapro was discontinued. Repeat EKG revealed Qtc of ~480s. Home Donepezil restarted on 3/29 and am QTc was 498. Held home Donepezil on 3/30 and 3/31 am QTc was 440.  Patient was subsequently stabilized.  [facility] hospitalist assumed care on 09/27/2019.    Today, patient reported feeling okay, denies any chest pain, worsening shortness of breath, abdominal pain, nausea/vomiting, fever/chills.  Daughter at bedside.    Assessment/Plan: Active Problems:   Persistent atrial fibrillation (HCC)   Chronic diastolic HF (heart failure) (HCC)   Dementia (HCC)   Hyponatremia   Acute metabolic encephalopathy   Leukocytosis   Hypothyroidism   UTI (urinary tract infection)   Cardiac arrest ([place])  Cardiac arrest Noted to have V. tach with defibrillation, prolonged QTC Intubated on 3/28, extubated on 3/30 Cardiology on board, appreciate recs Discontinue recently started Lexapro as well as home Aricept Continue to monitor electrolytes closely  Acute hypoxic respiratory failure Intubated on 3/28, extubated on 3/30 Continues to require about 3 L  nasal cannula Continue pulmonary hygiene Supplemental oxygen, plan to wean off Duo nebs as needed  UTI Currently afebrile, with no leukocytosis Blood culture x2 no growth to date UC grew E. Coli, sensitive to ceftriaxone Continue ceftriaxone, plan to DC on 4/2  Paroxysmal A. Fib Continue home diltiazem, restart Xarelto  Normocytic chronic anemia Hemoglobin baseline around 12 No obvious signs of bleeding Daily CBC  History of COPD Continue albuterol as needed, supplemental oxygen  Hypothyroidism Continue home Synthroid  BPH Continue home Flomax  Dementia Plan to DC Aricept due to noted prolonged QTC, V. tach and cardiac arrest            Malnutrition Type:  Nutrition Problem: Severe Malnutrition Etiology: chronic illness(COPD)   Malnutrition Characteristics:  Signs/Symptoms: severe fat depletion, severe muscle depletion, energy intake < or equal to 75% for > or equal to 1 month   Nutrition Interventions:  Interventions: Tube feeding    Estimated body mass index is 20.05 kg/m as calculated from the following:   Height as of this encounter: 5' 10.5" (1.791 m).   Weight as of this encounter: 64.3 kg.     Code Status: DNR  Family Communication: Daughter at bedside  Disposition Plan: Patient came from SNF, plan to DC to SNF as per recommendations from PT/OT.  TOC notified.  Still requiring 3 L of oxygen, plan to wean off   Consultants:  Cardiology  PCCM  Procedures:  Mechanical ventilation  Antimicrobials:  Ceftriaxone  DVT prophylaxis: Xarelto   Objective: Vitals:   09/27/19 1100 09/27/19 1154 09/27/19 1200 09/27/19 1300  BP: (!) 100/57  (!) 92/58 (!) 93/58  Pulse:  86  Resp: 19  20 (!) 24  Temp:  (!) 96.7 F (35.9 C)    TempSrc:  Axillary    SpO2:    97%  Weight:      Height:        Intake/Output Summary (Last 24 hours) at 09/27/2019 1508 Last data filed at 09/27/2019 0559 Gross per 24 hour  Intake 125.29 ml  Output  260 ml  Net -134.71 ml   Filed Weights   09/25/19 0738 09/26/19 0600 09/27/19 0300  Weight: 68.2 kg 66.3 kg 64.3 kg    Exam:  General: NAD  Cardiovascular: S1, S2 present  Respiratory:  Mild bibasilar crackles noted  Abdomen: Soft, nontender, nondistended, bowel sounds present  Musculoskeletal: No bilateral pedal edema noted  Skin: Normal  Psychiatry: Normal mood   Data Reviewed: CBC: Recent Labs  Lab 09/19/2019 2252 09/03/2019 2252 09/24/19 2103 09/24/19 2138 09/25/19 0435 09/26/19 0940 09/27/19 0653  WBC 17.0*  --  14.0*  --  13.6* 10.5 9.6  NEUTROABS 13.9*  --   --   --  11.1*  --   --   HGB 13.0   < > 12.6* 11.6* 13.3 10.8* 10.7*  HCT 37.3*   < > 37.7* 34.0* 39.7 31.8* 31.8*  MCV 98.2  --  102.4*  --  101.0* 98.8 101.0*  PLT 222  --  236  --  217 193 192   < > = values in this interval not displayed.   Basic Metabolic Panel: Recent Labs  Lab 09/01/2019 2252 09/09/2019 2252 09/24/19 2103 09/24/19 2138 09/25/19 0435 09/26/19 0940 09/27/19 0653  NA 131*   < > 133* 135 135 134* 136  K 4.0   < > 3.7 3.5 4.6 3.8 3.8  CL 98  --  103  --  102 103 103  CO2 20*  --  18*  --  21* 19* 21*  GLUCOSE 141*  --  130*  --  124* 96 88  BUN 18  --  19  --  20 25* 24*  CREATININE 1.21  --  1.26*  --  1.11 1.15 1.20  CALCIUM 8.8*  --  8.2*  --  8.6* 8.1* 8.3*  MG  --   --  1.9  --  2.4  --  2.3  PHOS  --   --   --   --  3.7  --   --    < > = values in this interval not displayed.   GFR: Estimated Creatinine Clearance: 36.5 mL/min (by C-G formula based on SCr of 1.2 mg/dL). Liver Function Tests: Recent Labs  Lab 09/07/2019 2252 09/24/19 2103 09/25/19 0435  AST 175* 123* 93*  ALT 37 74* 68*  ALKPHOS 46 47 49  BILITOT 0.9 1.2 0.9  PROT 6.6 5.8* 6.5  ALBUMIN 2.7* 2.3* 2.6*   No results for input(s): LIPASE, AMYLASE in the last 168 hours. No results for input(s): AMMONIA in the last 168 hours. Coagulation Profile: Recent Labs  Lab 09/20/2019 2252  INR 3.1*    Cardiac Enzymes: No results for input(s): CKTOTAL, CKMB, CKMBINDEX, TROPONINI in the last 168 hours. BNP (last 3 results) No results for input(s): PROBNP in the last 8760 hours. HbA1C: No results for input(s): HGBA1C in the last 72 hours. CBG: Recent Labs  Lab 09/24/19 2041 09/24/19 2136  GLUCAP 132* 124*   Lipid Profile: No results for input(s): CHOL, HDL, LDLCALC, TRIG, CHOLHDL, LDLDIRECT in the last 72 hours. Thyroid Function Tests: No  results for input(s): TSH, T4TOTAL, FREET4, T3FREE, THYROIDAB in the last 72 hours. Anemia Panel: No results for input(s): VITAMINB12, FOLATE, FERRITIN, TIBC, IRON, RETICCTPCT in the last 72 hours. Urine analysis:    Component Value Date/Time   COLORURINE YELLOW 09/24/2019 0212   APPEARANCEUR TURBID (A) 09/24/2019 0212   LABSPEC 1.023 09/24/2019 0212   PHURINE 6.0 09/24/2019 0212   GLUCOSEU NEGATIVE 09/24/2019 0212   HGBUR LARGE (A) 09/24/2019 0212   BILIRUBINUR NEGATIVE 09/24/2019 0212   KETONESUR NEGATIVE 09/24/2019 0212   PROTEINUR 100 (A) 09/24/2019 0212   UROBILINOGEN 0.2 07/12/2013 2230   NITRITE POSITIVE (A) 09/24/2019 0212   LEUKOCYTESUR MODERATE (A) 09/24/2019 0212   Sepsis Labs: @LABRCNTIP (procalcitonin:4,lacticidven:4)  ) Recent Results (from the past 240 hour(s))  Urine culture     Status: Abnormal   Collection Time: 09/24/19  2:11 AM   Specimen: Urine, Catheterized  Result Value Ref Range Status   Specimen Description URINE, CATHETERIZED  Final   Special Requests   Final    NONE Performed at [facility], [address] [place], [place] [address]    Culture >=100,000 COLONIES/mL ESCHERICHIA COLI (A)  Final   Report Status 09/26/2019 FINAL  Final   Organism ID, Bacteria ESCHERICHIA COLI (A)  Final      Susceptibility   Escherichia coli - MIC*    AMPICILLIN >=32 RESISTANT Resistant     CEFAZOLIN >=64 RESISTANT Resistant     CEFTRIAXONE 0.5 SENSITIVE Sensitive     CIPROFLOXACIN >=4 RESISTANT Resistant      GENTAMICIN <=1 SENSITIVE Sensitive     IMIPENEM <=0.25 SENSITIVE Sensitive     NITROFURANTOIN <=16 SENSITIVE Sensitive     TRIMETH/SULFA <=20 SENSITIVE Sensitive     AMPICILLIN/SULBACTAM >=32 RESISTANT Resistant     PIP/TAZO >=128 RESISTANT Resistant     * >=100,000 COLONIES/mL ESCHERICHIA COLI  Blood culture (routine x 2)     Status: None (Preliminary result)   Collection Time: 09/24/19  3:50 AM   Specimen: BLOOD  Result Value Ref Range Status   Specimen Description BLOOD RIGHT ARM  Final   Special Requests   Final    BOTTLES DRAWN AEROBIC AND ANAEROBIC Blood Culture adequate volume   Culture   Final    NO GROWTH 3 DAYS Performed at [facility], [address] [place], [place] [address]    Report Status PENDING  Incomplete  Blood culture (routine x 2)     Status: None (Preliminary result)   Collection Time: 09/24/19  3:50 AM   Specimen: BLOOD  Result Value Ref Range Status   Specimen Description BLOOD LEFT ARM  Final   Special Requests   Final    BOTTLES DRAWN AEROBIC AND ANAEROBIC Blood Culture adequate volume   Culture   Final    NO GROWTH 3 DAYS Performed at [facility], [place] [address] [place], [place] [address]    Report Status PENDING  Incomplete  SARS CORONAVIRUS 2 (TAT 6-24 HRS) Nasopharyngeal Nasopharyngeal Swab     Status: None   Collection Time: 09/24/19  4:27 AM   Specimen: Nasopharyngeal Swab  Result Value Ref Range Status   SARS Coronavirus 2 NEGATIVE NEGATIVE Final    Comment: (NOTE) SARS-CoV-2 target nucleic acids are NOT DETECTED. The SARS-CoV-2 RNA is generally detectable in upper and lower respiratory specimens during the acute phase of infection. Negative results do not preclude SARS-CoV-2 infection, do not rule out co-infections with other pathogens, and should not be used  as the sole basis for treatment or other patient management decisions. Negative results must be combined with clinical observations, patient history, and  epidemiological information. The expected result is Negative. Fact Sheet for Patients: SugarRoll.be Fact Sheet for Healthcare Providers: https://www.woods-mathews.com/ This test is not yet approved or cleared by the [place] FDA and  has been authorized for detection and/or diagnosis of SARS-CoV-2 by FDA under an Emergency Use Authorization (EUA). This EUA will remain  in effect (meaning this test can be used) for the duration of the COVID-19 declaration under Section 56 4(b)(1) of the Act, 21 U.S.C. section 360bbb-3(b)(1), unless the authorization is terminated or revoked sooner. Performed at [facility], [place] [address], [place], [place] [address]   MRSA PCR Screening     Status: None   Collection Time: 09/24/19  9:30 PM   Specimen: Nasopharyngeal  Result Value Ref Range Status   MRSA by PCR NEGATIVE NEGATIVE Final    Comment:        The GeneXpert MRSA Assay (FDA approved for NASAL specimens only), is one component of a comprehensive MRSA colonization surveillance program. It is not intended to diagnose MRSA infection nor to guide or monitor treatment for MRSA infections. Performed at [facility], [place] [address] [place], [place] [address]       Studies: No results found.  Scheduled Meds: . diltiazem  120 mg Oral Daily  . erythromycin  1 application Left Eye QID  . feeding supplement (ENSURE ENLIVE)  237 mL Oral TID BM  . levothyroxine  75 mcg Per Tube Q0600  . pantoprazole  40 mg Oral Daily  . rivaroxaban  15 mg Oral Q lunch  . tamsulosin  0.4 mg Oral Q supper  . [START ON 10/09/2019] vitamin B-12  500 mcg Oral Once per day on Mon Thu    Continuous Infusions: . cefTRIAXone (ROCEPHIN)  IV 1 g (09/27/19 0557)     LOS: 3 days     Alma Friendly, MD [facility]  If 7PM-7AM, please contact night-coverage [email or web address] 09/27/2019, 3:08 PM

## 2019-09-27 NOTE — Progress Notes (Signed)
NAME:  Cameron [name], MRN:  [id number], DOB:  1926-06-10, LOS: 3 ADMISSION DATE:  08/29/2019, CONSULTATION DATE:  09/25/2019 REFERRING MD:  TRH, CHIEF COMPLAINT:  Post ROSC management  Brief History   86 year old man with history of HFpEF, COPD, atrial fibrillation on Xarelto, CAD, pulmonary fibrosis, GERD, hypothyroidism, recent hospitalization from 3/10-3/18 for facial cellulitis and hyponatremia presenting today with acute encephalopathy found to have UTI. Vtach arrest occurred evening of 3/28.  History of present illness   86 year old man with history of HFpEF, COPD, atrial fibrillation on Xarelto, CAD, pulmonary fibrosis, GERD, hypothyroidism, recent hospitalization from 3/10-3/18 for facial cellulitis and hyponatremia presenting today with acute encephalopathy.  He has been having worse lethargy over the past few days.  There have been some reported falls at the facility.  Upon arrival to [facility] ED, he was found to have a UTI and started on antibiotics.  On evening of 3/28 he was found to be in pulseless Vtach.  CPR was initiated.  He was defibrillated x1.  ROSC was achieved after 4 minutes.  He had agonal breathing and was intubated. Was previously on Lexapro, however given increase in Qtc >500 and Vtach, Lexapro was discontinued. Repeat EKG revealed Qtc of ~480s. Home Donepezil restarted on 3/29 and am QTc was 498. Held home Donepezil on 3/30 and 3/31 am QTc was 440.   Past Medical History  HFpEF, COPD, atrial fibrillation on Xarelto, CAD, pulmonary fibrosis, GERD, hypothyroidism  Significant Hospital Events   3/28> vtach arrest, CPR x 4 min with 1 defib. Intubation  Consults:  Cardiology 3/28  Procedures:  None  Significant Diagnostic Tests:  ECHO 3/29:   IMPRESSIONS  1. Left ventricular ejection fraction, by estimation, is 35 to 40%. The  left ventricle has moderately decreased function. The left ventricle  demonstrates regional wall motion abnormalities (see scoring    diagram/findings for description).  Inferior/inferolateral akinesis. Left ventricular diastolic parameters are  indeterminate.  2. Right ventricular systolic function is mildly reduced. The right  ventricular size is mildly enlarged.  3. Right atrial size was severely dilated.  4. The mitral valve is normal in structure. Moderate mitral valve  regurgitation.  5. The tricuspid valve is abnormal. Tricuspid valve regurgitation is  moderate.  6. The aortic valve is tricuspid. Aortic valve regurgitation is mild to  moderate. Mild aortic valve sclerosis is present, with no evidence of  aortic valve stenosis.   CXR 3/28: Pulmonary scarring and fibrosis. No superimposed airspace disease.  CT chest w/ contrast on 3/28: 1. Spectrum of findings compatible with basilar predominant fibrotic interstitial lung disease with mild honeycombing, progressed since 2008 chest CT, considered diagnostic of usual interstitial pneumonia (UIP). Mild cardiomegaly. Contrast reflux into the IVC and hepatic veins, suggesting elevated right heart pressures. Two-vessel coronary atherosclerosis. Small hiatal hernia. Cholelithiasis. Aortic Atherosclerosis (ICD10-I70.0) and Emphysema (ICD10-J43.9).  CT head w/o contrast 3/28: No acute intracranial abnormality.  Micro Data:  COVID 3/28> neg Blood Cx 3/28> NGTD @ 48 hr UCx 3/28>  >=100,000 E. Coli  Antimicrobials:  Ceftriaxone 3/28>4/2  Interim history/subjective:  Satting appropriately while extubated on 3L [place]. Speech is coherent and patient reports that his concern now is related to his sore throat, which ice chips seem to help. QTc changes noted when home Donepezil was restarted, now improved since being held. Will continue to hold. Overall condition continues to improve.   Objective   Blood pressure (!) 95/53, pulse 61, temperature 97.7 F (36.5 C), resp. rate 11, height 5' 10.5" (  1.791 m), weight 64.3 kg, SpO2 100 %.    Vent Mode: CPAP;PSV FiO2 (%):  [40  %] 40 % PEEP:  [5 cmH20] 5 cmH20 Pressure Support:  [10 cmH20] 10 cmH20   Intake/Output Summary (Last 24 hours) at 09/27/2019 0810 Last data filed at 09/27/2019 0559 Gross per 24 hour  Intake 189.44 ml  Output 510 ml  Net -320.56 ml   Filed Weights   09/25/19 0738 09/26/19 0600 09/27/19 0300  Weight: 68.2 kg 66.3 kg 64.3 kg    Examination: General: Elderly male who appears to be awake and alert, NAD HENT: Facial presentation symmetric, EOMI, Anicteric sclera Lungs: Lungs CTAB b/l, normal work of breathing Cardiovascular: Intermittent Ectopy, otherwise sinus rhythm. No murmurs or gallops Abdomen: Soft, nontender, nondistended MSK/Extremities: 5/5 strength in upper and lower extremities. 1+ pitting edema, unchanged from previous exam.  Neuro: Oriented to place and person but not time. Speech coherent and responds appropriately to questions. Cranial nerves II - XII grossly intact.  GU: Adequate urine output.   Assessment & Plan:   Critically ill condition appears to be improving. Extubated on 3/30 and breathing is well controlled on 3L [place] - Incentive spirometer - Will continue to monitor  Cardiac arrest: v tach with defibrillation. Normotensive presently. Previous EKG with a fib, now mostly in sinus tach. QTc now 440 from 498. Holing QTc prolonging medications.  --Cardiology following --Discontinue Lexapro --Continue to hold home Donepezil --Replete electrolytes for goal K>4 and Mg>2  UTI: Leukocytosis resolved --Continue ceftriaxone q24h d/c on 4/2 --Follow up urine and blood cultures  Atrial fibrillation: --Restart home anticoagulation. D/c Heparin gtt today. --Continue home diltiazem  AKI: Cr now stable at 1.2 from 1.5, maintains adequate urine output --CTM  Chronic Conditions:  Hyponatremia:  --Monitor Na. Improving. Mental status stable. Na currently 136 from 134  Chronic anemia: Hemoglobin Stable at 10.7 from 10.8, will CTM  COPD: Continue albuterol as  needed  Dementia: Hold Aricept   Hypothyroidism: Continue home Synthroid  BPH: Continue home Flomax  Best practice:  Diet: Advance as tolerated Pain/Anxiety/Delirium protocol (if indicated): Fentanyl gtt d/c'd on 3/29, pain well controlled.  VAP protocol (if indicated): Ordered DVT prophylaxis: Heparin gtt GI prophylaxis: PPI Glucose control: Monitor Mobility: PT when able Code Status: Full Family Communication: Discussed plan with son and daughter-in-law Disposition: ICU with transfer to floor  Labs   CBC: Recent Labs  Lab 09/13/2019 2252 08/31/2019 2252 09/24/19 2103 09/24/19 2138 09/25/19 0435 09/26/19 0940 09/27/19 0653  WBC 17.0*  --  14.0*  --  13.6* 10.5 9.6  NEUTROABS 13.9*  --   --   --  11.1*  --   --   HGB 13.0   < > 12.6* 11.6* 13.3 10.8* 10.7*  HCT 37.3*   < > 37.7* 34.0* 39.7 31.8* 31.8*  MCV 98.2  --  102.4*  --  101.0* 98.8 101.0*  PLT 222  --  236  --  217 193 192   < > = values in this interval not displayed.    Basic Metabolic Panel: Recent Labs  Lab 09/09/2019 2252 08/28/2019 2252 09/24/19 2103 09/24/19 2138 09/25/19 0435 09/26/19 0940 09/27/19 0653  NA 131*   < > 133* 135 135 134* 136  K 4.0   < > 3.7 3.5 4.6 3.8 3.8  CL 98  --  103  --  102 103 103  CO2 20*  --  18*  --  21* 19* 21*  GLUCOSE 141*  --  130*  --  124* 96 88  BUN 18  --  19  --  20 25* 24*  CREATININE 1.21  --  1.26*  --  1.11 1.15 1.20  CALCIUM 8.8*  --  8.2*  --  8.6* 8.1* 8.3*  MG  --   --  1.9  --  2.4  --  2.3  PHOS  --   --   --   --  3.7  --   --    < > = values in this interval not displayed.   GFR: Estimated Creatinine Clearance: 36.5 mL/min (by C-G formula based on SCr of 1.2 mg/dL). Recent Labs  Lab 09/27/2019 2252 09/24/19 0350 09/24/19 0545 09/24/19 2103 09/25/19 0435 09/26/19 0940 09/27/19 0653  WBC   < >  --   --  14.0* 13.6* 10.5 9.6  LATICACIDVEN  --  2.1* 2.3*  --  2.6*  --   --    < > = values in this interval not displayed.    Liver Function  Tests: Recent Labs  Lab 09/26/2019 2252 09/24/19 2103 09/25/19 0435  AST 175* 123* 93*  ALT 37 74* 68*  ALKPHOS 46 47 49  BILITOT 0.9 1.2 0.9  PROT 6.6 5.8* 6.5  ALBUMIN 2.7* 2.3* 2.6*    ABG    Component Value Date/Time   PHART 7.340 (L) 09/24/2019 2138   PCO2ART 38.7 09/24/2019 2138   PO2ART 402.0 (H) 09/24/2019 2138   HCO3 20.7 09/25/2019 0510   TCO2 22 09/24/2019 2138   ACIDBASEDEF 3.9 (H) 09/25/2019 0510   O2SAT 74.3 09/25/2019 0510     Coagulation Profile: Recent Labs  Lab 08/30/2019 2252  INR 3.1*    CBG: Recent Labs  Lab 09/24/19 2041 09/24/19 2136  GLUCAP 132* 124*    CRITICAL CARE  Note initiated by Carolan Shiver. Meda Coffee, MS4.

## 2019-09-27 NOTE — Progress Notes (Signed)
ANTICOAGULATION CONSULT NOTE   Pharmacy Consult for Heparin >> resuming Xarelto  Indication: atrial fibrillation  No Known Allergies  Patient Measurements: Weight: 66.3 kg  Vital Signs: Temp: 97.3 F (36.3 C) (03/31 0000) Temp Source: Axillary (03/31 0000) BP: 93/58 (03/31 0500) Pulse Rate: 60 (03/31 0500)  Labs: Recent Labs    09/24/19 2103 09/24/19 2103 09/24/19 2138 09/24/19 2138 09/24/19 2346 09/25/19 0435 09/25/19 0435 09/26/19 0047 09/26/19 0940 09/26/19 2050 09/27/19 0237  HGB 12.6*   < > 11.6*   < >  --  13.3  --   --  10.8*  --   --   HCT 37.7*   < > 34.0*  --   --  39.7  --   --  31.8*  --   --   PLT 236  --   --   --   --  217  --   --  193  --   --   APTT  --   --   --   --   --  28   < > 65* 64* 58*  --   HEPARINUNFRC  --   --   --   --   --  >2.20*  --  1.56*  --   --  0.93*  CREATININE 1.26*  --   --   --   --  1.11  --   --  1.15  --   --   TROPONINIHS 10,382*  --   --   --  9,437*  --   --   --   --   --   --    < > = values in this interval not displayed.    Estimated Creatinine Clearance: 38.1 mL/min (by C-G formula based on SCr of 1.15 mg/dL).   Medical History: Past Medical History:  Diagnosis Date  . Arthritis   . CHF (congestive heart failure) ([place])   . COPD (chronic obstructive pulmonary disease) ([place])   . Dysrhythmia    atrial fibrilation  . GERD (gastroesophageal reflux disease)   . Pulmonary fibrosis [facility])      Assessment: 86 y/o M with recent hospital stay for facial cellulitis. Presented back to the ED with worsening lethargy/falls, now s/p cardiac arrest 3/28 with ROSC. He is on Xarelto PTA for atrial fibrillation. This will be held for now and the patient will be started on a heparin drip. Last dose of Xarelto was given in the hospital on 3/28 at 1711.   APTT this morning is subtherapeutic at 62 seconds after a rate increase to 1050 units/hour last night. APTT was drawn late apparently due to automatic discontinuation of the  lab that was ordered, otherwise drawn appropriately.  Heparin level overnight elevated as expected due to influence of Xarelto. H&H is stable at 10.7/31.8, platelets remain wnl at 192. No infusion or bleeding issues noted. Renal function has trended up slightly, today's Cr 1.2, eCrCl with ABW ~41ml/min.   Goal of Therapy:  Heparin level 0.3-0.7 units/ml aPTT 66-102 seconds Monitor platelets by anticoagulation protocol: Yes   Plan:  Increase heparin infusion to 1150 units/hr to get him into therapeutic range prior to discontinuing at 1200 STOP heparin at 1200 when patient eats lunch and starts Xarelto  START PTA Xarelto 15 mg daily at lunch Monitor for signs of bleeding   Thank you,   Eddie Candle, PharmD PGY-1 Pharmacy Resident   Please check amion for clinical pharmacist contact number

## 2019-09-27 NOTE — H&P (View-Only) (Signed)
Progress Note  Patient Name: Cameron [name] Date of Encounter: 09/27/2019  Primary Cardiologist: New    Subjective   Patient denies CP   Breathing is fair  Little congestion  He says he never had CP    Inpatient Medications    Scheduled Meds: . Chlorhexidine Gluconate Cloth  6 each Topical Daily  . diltiazem  30 mg Per Tube Q6H  . erythromycin  1 application Left Eye QID  . levothyroxine  75 mcg Per Tube Q0600  . pantoprazole sodium  40 mg Per Tube Daily   Continuous Infusions: . cefTRIAXone (ROCEPHIN)  IV 1 g (09/27/19 0557)  . fentaNYL infusion INTRAVENOUS Stopped (09/25/19 1201)  . heparin 1,050 Units/hr (09/27/19 0300)   PRN Meds: acetaminophen, albuterol, fentaNYL, polyethylene glycol   Vital Signs    Vitals:   09/27/19 0200 09/27/19 0300 09/27/19 0400 09/27/19 0500  BP:  (!) 102/55 (!) 102/57 (!) 93/58  Pulse: 60  68 60  Resp: 18 12 18 16   Temp:      TempSrc:      SpO2: 100%  100% 98%  Weight:  64.3 kg    Height:        Intake/Output Summary (Last 24 hours) at 09/27/2019 [id number] Last data filed at 09/27/2019 0559 Gross per 24 hour  Intake 189.44 ml  Output 510 ml  Net -320.56 ml   Last 3 Weights 09/27/2019 09/26/2019 09/25/2019  Weight (lbs) 141 lb 12.1 oz 146 lb 2.6 oz 150 lb 5.7 oz  Weight (kg) 64.3 kg 66.3 kg 68.2 kg      Telemetry     SR with PACs- Personally Reviewed  ECG      Physical Exam   GEN: Frail 86 yo in no acute distress.   Neck: JVP is normal   Cardiac: RRR, no murmurs, rubs, or gallops.  Respiratory: Clear to auscultation GI: Soft, nontender, non-distended  MS: No edema; No deformity. Neuro:  Nonfocal    Labs    High Sensitivity Troponin:   Recent Labs  Lab 09/24/19 2103 09/24/19 2346  TROPONINIHS 10,382* 9,437*      Chemistry Recent Labs  Lab 09/13/2019 2252 09/07/2019 2252 09/24/19 2103 09/24/19 2103 09/24/19 2138 09/25/19 0435 09/26/19 0940  NA 131*   < > 133*   < > 135 135 134*  K 4.0   < > 3.7   < > 3.5  4.6 3.8  CL 98   < > 103  --   --  102 103  CO2 20*   < > 18*  --   --  21* 19*  GLUCOSE 141*   < > 130*  --   --  124* 96  BUN 18   < > 19  --   --  20 25*  CREATININE 1.21   < > 1.26*  --   --  1.11 1.15  CALCIUM 8.8*   < > 8.2*  --   --  8.6* 8.1*  PROT 6.6  --  5.8*  --   --  6.5  --   ALBUMIN 2.7*  --  2.3*  --   --  2.6*  --   AST 175*  --  123*  --   --  93*  --   ALT 37  --  74*  --   --  68*  --   ALKPHOS 46  --  47  --   --  49  --   BILITOT 0.9  --  1.2  --   --  0.9  --   GFRNONAA 52*   < > 50*  --   --  58* 55*  GFRAA >60   < > 57*  --   --  >60 >60  ANIONGAP 13   < > 12  --   --  12 12   < > = values in this interval not displayed.     Hematology Recent Labs  Lab 09/24/19 2103 09/24/19 2103 09/24/19 2138 09/25/19 0435 09/26/19 0940  WBC 14.0*  --   --  13.6* 10.5  RBC 3.68*  --   --  3.93* 3.22*  HGB 12.6*   < > 11.6* 13.3 10.8*  HCT 37.7*   < > 34.0* 39.7 31.8*  MCV 102.4*  --   --  101.0* 98.8  MCH 34.2*  --   --  33.8 33.5  MCHC 33.4  --   --  33.5 34.0  RDW 13.2  --   --  13.2 13.2  PLT 236  --   --  217 193   < > = values in this interval not displayed.    BNPNo results for input(s): BNP, PROBNP in the last 168 hours.   DDimer No results for input(s): DDIMER in the last 168 hours.   Radiology    ECHOCARDIOGRAM COMPLETE  Result Date: 09/25/2019    ECHOCARDIOGRAM REPORT   Patient Name:   [name] Date of Exam: 09/25/2019 Medical Rec #:  [id number]       Height:       70.5 in Accession #:    [id number]      Weight:       150.4 lb Date of Birth:  12-Mar-1926      BSA:          1.859 m Patient Age:    27 years        BP:           100/59 mmHg Patient Gender: M               HR:           66 bpm. Exam Location:  Inpatient Procedure: 2D Echo, Cardiac Doppler and Color Doppler Indications:    Cardiac arrest I46.9  History:        Patient has prior history of Echocardiogram examinations, most                 recent 10/05/2012. CHF, Arrythmias:Atrial  Fibrillation and Cardiac                 Arrest, Signs/Symptoms:Hypotension; Risk Factors:Former Smoker.  Sonographer:    Vickie Epley RDCS Referring Phys: [id number] Milagros Loll IMPRESSIONS  1. Left ventricular ejection fraction, by estimation, is 35 to 40%. The left ventricle has moderately decreased function. The left ventricle demonstrates regional wall motion abnormalities (see scoring diagram/findings for description). Inferior/inferolateral akinesis. Left ventricular diastolic parameters are indeterminate.  2. Right ventricular systolic function is mildly reduced. The right ventricular size is mildly enlarged.  3. Right atrial size was severely dilated.  4. The mitral valve is normal in structure. Moderate mitral valve regurgitation.  5. The tricuspid valve is abnormal. Tricuspid valve regurgitation is moderate.  6. The aortic valve is tricuspid. Aortic valve regurgitation is mild to moderate. Mild aortic valve sclerosis is present, with no evidence of aortic valve stenosis. FINDINGS  Left Ventricle: Left ventricular ejection fraction, by estimation, is 35 to 40%. The  left ventricle has moderately decreased function. The left ventricle demonstrates regional wall motion abnormalities. The left ventricular internal cavity size was normal in size. There is no left ventricular hypertrophy. Left ventricular diastolic parameters are indeterminate.  LV Wall Scoring: The inferior wall and posterior wall are akinetic. The entire anterior wall, antero-lateral wall, entire septum, and entire apex are normal. Right Ventricle: The right ventricular size is mildly enlarged. Right vetricular wall thickness was not assessed. Right ventricular systolic function is mildly reduced. There is mildly elevated pulmonary artery systolic pressure. The tricuspid regurgitant velocity is 2.74 m/s, and with an assumed right atrial pressure of 8 mmHg, the estimated right ventricular systolic pressure is [id number] mmHg. Left Atrium: Left  atrial size was normal in size. Right Atrium: Right atrial size was severely dilated. Pericardium: Trivial pericardial effusion is present. Mitral Valve: The mitral valve is normal in structure. Moderate mitral valve regurgitation. Tricuspid Valve: The tricuspid valve is abnormal. Tricuspid valve regurgitation is moderate. Aortic Valve: The aortic valve is tricuspid. Aortic valve regurgitation is mild to moderate. Aortic regurgitation PHT measures 640 msec. Mild aortic valve sclerosis is present, with no evidence of aortic valve stenosis. Pulmonic Valve: The pulmonic valve was not well visualized. Pulmonic valve regurgitation is trivial. Aorta: The aortic root is normal in size and structure. Venous: IVC assessment for right atrial pressure unable to be performed due to mechanical ventilation. IAS/Shunts: No atrial level shunt detected by color flow Doppler.  LEFT VENTRICLE PLAX 2D LVIDd:         5.40 cm      Diastology LVIDs:         4.40 cm      LV e' lateral: 3.94 cm/s LV PW:         0.80 cm      LV e' medial:  6.49 cm/s LV IVS:        0.80 cm LVOT diam:     2.10 cm LV SV:         34 LV SV Index:   18 LVOT Area:     3.46 cm  LV Volumes (MOD) LV vol d, MOD A2C: 111.0 ml LV vol d, MOD A4C: 126.0 ml LV vol s, MOD A2C: 70.3 ml LV vol s, MOD A4C: 76.6 ml LV SV MOD A2C:     40.7 ml LV SV MOD A4C:     126.0 ml LV SV MOD BP:      50.4 ml RIGHT VENTRICLE RV S prime:     9.00 cm/s TAPSE (M-mode): 1.9 cm LEFT ATRIUM           Index       RIGHT ATRIUM           Index LA diam:      3.90 cm 2.10 cm/m  RA Area:     31.70 cm LA Vol (A2C): 41.7 ml 22.43 ml/m RA Volume:   114.00 ml 61.33 ml/m LA Vol (A4C): 48.1 ml 25.88 ml/m  AORTIC VALVE LVOT Vmax:   58.70 cm/s LVOT Vmean:  36.200 cm/s LVOT VTI:    0.098 m AI PHT:      640 msec  AORTA Ao Root diam: 3.20 cm MR Peak grad:    79.7 mmHg   TRICUSPID VALVE MR Mean grad:    55.0 mmHg   TR Peak grad:   30.0 mmHg MR Vmax:         446.50 cm/s TR Vmax:        274.00 cm/s  MR Vmean:         352.0 cm/s MR PISA:         0.57 cm    SHUNTS MR PISA Eff ROA: 5 mm       Systemic VTI:  0.10 m MR PISA Radius:  0.30 cm     Systemic Diam: 2.10 cm Oswaldo Milian MD Electronically signed by Oswaldo Milian MD Signature Date/Time: 09/25/2019/1:26:45 PM    Final     Cardiac Studies    Patient Profile     86 y.o. male with hx of COPD, pulm fibrosis, diastolic CHF, perm atrial fibriillation, NICM in 2014 (with recovery of LVEF ) who presented feeling poorly, developed TdP.    Assessment & Plan    1 Rhythm   PT with polymorphic VT arrest   QT prolonged probably from recent addition of Lexapro.   This has been stopped    Remains in SR since defib  No further vent ectopy    2   CAD   Troponin with signif bump  10,000  At 21:00 last night   This is 30 min after cardiac arrest   This goes along that ischemia/injury was happending long before the arrhythmic event    Predisposed to event     Echo in 2014 showed EF was 30 to 35%   On discussion with Dr Radford Pax the pt had another echo that showed normalization of the LVEF   (felt tachy induced.   Was probably done as outpt) REview of EKG with subtle ST changes , not diagnostic CT with evid of CAD LVEF is now down   With wall motion abnormalities    I reviewed wht pt  Causes   Will review again when family arrives  2  Atrial fibrillation  Pt has a hx of chronic atrial fibrillation   Converted to SR after defibrillation   He remains in SR   On heparin   Follow with tele   5 ID Empiric ABX since admit   WBC now 9.6  Pt afebrile   ? Urin    6  Pulmonary   Now extubated   Doing OK on 2 L   Follow       ADDENDUM:  2:30 PM  Spoke with pt and family (via phone ) about pt's current cardiac condition. With recent MI and echo showing decreased LV function with regional wall motion changes concern for CAD.  ( Anatomy in 2014 without significant CAD)   Concern if there is residual stenoses and he has further events his LVEF would decline  further.    Patient says he would like to live aas long as possible Discussed L heart cath and possible PTCA / stent if a discrete critical narrowing was present    He would not be a candidate for elective CABG.  Discussed risks and benefits   Pt and family understand   Pt would like to proceed with L heart cath with possible PTCA/stent tomorrow.  Signed, Dorris Carnes, MD  09/27/19

## 2019-09-27 NOTE — Progress Notes (Addendum)
Progress Note  Patient Name: Cameron [name] Date of Encounter: 09/27/2019  Primary Cardiologist: New    Subjective   Patient denies CP   Breathing is fair  Little congestion  He says he never had CP    Inpatient Medications    Scheduled Meds: . Chlorhexidine Gluconate Cloth  6 each Topical Daily  . diltiazem  30 mg Per Tube Q6H  . erythromycin  1 application Left Eye QID  . levothyroxine  75 mcg Per Tube Q0600  . pantoprazole sodium  40 mg Per Tube Daily   Continuous Infusions: . cefTRIAXone (ROCEPHIN)  IV 1 g (09/27/19 0557)  . fentaNYL infusion INTRAVENOUS Stopped (09/25/19 1201)  . heparin 1,050 Units/hr (09/27/19 0300)   PRN Meds: acetaminophen, albuterol, fentaNYL, polyethylene glycol   Vital Signs    Vitals:   09/27/19 0200 09/27/19 0300 09/27/19 0400 09/27/19 0500  BP:  (!) 102/55 (!) 102/57 (!) 93/58  Pulse: 60  68 60  Resp: 18 12 18 16   Temp:      TempSrc:      SpO2: 100%  100% 98%  Weight:  64.3 kg    Height:        Intake/Output Summary (Last 24 hours) at 09/27/2019 [id number] Last data filed at 09/27/2019 0559 Gross per 24 hour  Intake 189.44 ml  Output 510 ml  Net -320.56 ml   Last 3 Weights 09/27/2019 09/26/2019 09/25/2019  Weight (lbs) 141 lb 12.1 oz 146 lb 2.6 oz 150 lb 5.7 oz  Weight (kg) 64.3 kg 66.3 kg 68.2 kg      Telemetry     SR with PACs- Personally Reviewed  ECG      Physical Exam   GEN: Frail 86 yo in no acute distress.   Neck: JVP is normal   Cardiac: RRR, no murmurs, rubs, or gallops.  Respiratory: Clear to auscultation GI: Soft, nontender, non-distended  MS: No edema; No deformity. Neuro:  Nonfocal    Labs    High Sensitivity Troponin:   Recent Labs  Lab 09/24/19 2103 09/24/19 2346  TROPONINIHS 10,382* 9,437*      Chemistry Recent Labs  Lab 09/22/2019 2252 09/15/2019 2252 09/24/19 2103 09/24/19 2103 09/24/19 2138 09/25/19 0435 09/26/19 0940  NA 131*   < > 133*   < > 135 135 134*  K 4.0   < > 3.7   < > 3.5  4.6 3.8  CL 98   < > 103  --   --  102 103  CO2 20*   < > 18*  --   --  21* 19*  GLUCOSE 141*   < > 130*  --   --  124* 96  BUN 18   < > 19  --   --  20 25*  CREATININE 1.21   < > 1.26*  --   --  1.11 1.15  CALCIUM 8.8*   < > 8.2*  --   --  8.6* 8.1*  PROT 6.6  --  5.8*  --   --  6.5  --   ALBUMIN 2.7*  --  2.3*  --   --  2.6*  --   AST 175*  --  123*  --   --  93*  --   ALT 37  --  74*  --   --  68*  --   ALKPHOS 46  --  47  --   --  49  --   BILITOT 0.9  --  1.2  --   --  0.9  --   GFRNONAA 52*   < > 50*  --   --  58* 55*  GFRAA >60   < > 57*  --   --  >60 >60  ANIONGAP 13   < > 12  --   --  12 12   < > = values in this interval not displayed.     Hematology Recent Labs  Lab 09/24/19 2103 09/24/19 2103 09/24/19 2138 09/25/19 0435 09/26/19 0940  WBC 14.0*  --   --  13.6* 10.5  RBC 3.68*  --   --  3.93* 3.22*  HGB 12.6*   < > 11.6* 13.3 10.8*  HCT 37.7*   < > 34.0* 39.7 31.8*  MCV 102.4*  --   --  101.0* 98.8  MCH 34.2*  --   --  33.8 33.5  MCHC 33.4  --   --  33.5 34.0  RDW 13.2  --   --  13.2 13.2  PLT 236  --   --  217 193   < > = values in this interval not displayed.    BNPNo results for input(s): BNP, PROBNP in the last 168 hours.   DDimer No results for input(s): DDIMER in the last 168 hours.   Radiology    ECHOCARDIOGRAM COMPLETE  Result Date: 09/25/2019    ECHOCARDIOGRAM REPORT   Patient Name:   Cameron [name] Date of Exam: 09/25/2019 Medical Rec #:  [id number]       Height:       70.5 in Accession #:    [id number]      Weight:       150.4 lb Date of Birth:  1925-07-28      BSA:          1.859 m Patient Age:    72 years        BP:           100/59 mmHg Patient Gender: M               HR:           66 bpm. Exam Location:  Inpatient Procedure: 2D Echo, Cardiac Doppler and Color Doppler Indications:    Cardiac arrest I46.9  History:        Patient has prior history of Echocardiogram examinations, most                 recent 10/05/2012. CHF, Arrythmias:Atrial  Fibrillation and Cardiac                 Arrest, Signs/Symptoms:Hypotension; Risk Factors:Former Smoker.  Sonographer:    Vickie Epley RDCS Referring Phys: [id number] Milagros Loll IMPRESSIONS  1. Left ventricular ejection fraction, by estimation, is 35 to 40%. The left ventricle has moderately decreased function. The left ventricle demonstrates regional wall motion abnormalities (see scoring diagram/findings for description). Inferior/inferolateral akinesis. Left ventricular diastolic parameters are indeterminate.  2. Right ventricular systolic function is mildly reduced. The right ventricular size is mildly enlarged.  3. Right atrial size was severely dilated.  4. The mitral valve is normal in structure. Moderate mitral valve regurgitation.  5. The tricuspid valve is abnormal. Tricuspid valve regurgitation is moderate.  6. The aortic valve is tricuspid. Aortic valve regurgitation is mild to moderate. Mild aortic valve sclerosis is present, with no evidence of aortic valve stenosis. FINDINGS  Left Ventricle: Left ventricular ejection fraction, by estimation, is 35 to 40%. The  left ventricle has moderately decreased function. The left ventricle demonstrates regional wall motion abnormalities. The left ventricular internal cavity size was normal in size. There is no left ventricular hypertrophy. Left ventricular diastolic parameters are indeterminate.  LV Wall Scoring: The inferior wall and posterior wall are akinetic. The entire anterior wall, antero-lateral wall, entire septum, and entire apex are normal. Right Ventricle: The right ventricular size is mildly enlarged. Right vetricular wall thickness was not assessed. Right ventricular systolic function is mildly reduced. There is mildly elevated pulmonary artery systolic pressure. The tricuspid regurgitant velocity is 2.74 m/s, and with an assumed right atrial pressure of 8 mmHg, the estimated right ventricular systolic pressure is [id number] mmHg. Left Atrium: Left  atrial size was normal in size. Right Atrium: Right atrial size was severely dilated. Pericardium: Trivial pericardial effusion is present. Mitral Valve: The mitral valve is normal in structure. Moderate mitral valve regurgitation. Tricuspid Valve: The tricuspid valve is abnormal. Tricuspid valve regurgitation is moderate. Aortic Valve: The aortic valve is tricuspid. Aortic valve regurgitation is mild to moderate. Aortic regurgitation PHT measures 640 msec. Mild aortic valve sclerosis is present, with no evidence of aortic valve stenosis. Pulmonic Valve: The pulmonic valve was not well visualized. Pulmonic valve regurgitation is trivial. Aorta: The aortic root is normal in size and structure. Venous: IVC assessment for right atrial pressure unable to be performed due to mechanical ventilation. IAS/Shunts: No atrial level shunt detected by color flow Doppler.  LEFT VENTRICLE PLAX 2D LVIDd:         5.40 cm      Diastology LVIDs:         4.40 cm      LV e' lateral: 3.94 cm/s LV PW:         0.80 cm      LV e' medial:  6.49 cm/s LV IVS:        0.80 cm LVOT diam:     2.10 cm LV SV:         34 LV SV Index:   18 LVOT Area:     3.46 cm  LV Volumes (MOD) LV vol d, MOD A2C: 111.0 ml LV vol d, MOD A4C: 126.0 ml LV vol s, MOD A2C: 70.3 ml LV vol s, MOD A4C: 76.6 ml LV SV MOD A2C:     40.7 ml LV SV MOD A4C:     126.0 ml LV SV MOD BP:      50.4 ml RIGHT VENTRICLE RV S prime:     9.00 cm/s TAPSE (M-mode): 1.9 cm LEFT ATRIUM           Index       RIGHT ATRIUM           Index LA diam:      3.90 cm 2.10 cm/m  RA Area:     31.70 cm LA Vol (A2C): 41.7 ml 22.43 ml/m RA Volume:   114.00 ml 61.33 ml/m LA Vol (A4C): 48.1 ml 25.88 ml/m  AORTIC VALVE LVOT Vmax:   58.70 cm/s LVOT Vmean:  36.200 cm/s LVOT VTI:    0.098 m AI PHT:      640 msec  AORTA Ao Root diam: 3.20 cm MR Peak grad:    79.7 mmHg   TRICUSPID VALVE MR Mean grad:    55.0 mmHg   TR Peak grad:   30.0 mmHg MR Vmax:         446.50 cm/s TR Vmax:        274.00 cm/s  MR Vmean:         352.0 cm/s MR PISA:         0.57 cm    SHUNTS MR PISA Eff ROA: 5 mm       Systemic VTI:  0.10 m MR PISA Radius:  0.30 cm     Systemic Diam: 2.10 cm Oswaldo Milian MD Electronically signed by Oswaldo Milian MD Signature Date/Time: 09/25/2019/1:26:45 PM    Final     Cardiac Studies    Patient Profile     86 y.o. male with hx of COPD, pulm fibrosis, diastolic CHF, perm atrial fibriillation, NICM in 2014 (with recovery of LVEF ) who presented feeling poorly, developed TdP.    Assessment & Plan    1 Rhythm   PT with polymorphic VT arrest   QT prolonged probably from recent addition of Lexapro.   This has been stopped    Remains in SR since defib  No further vent ectopy    2   CAD   Troponin with signif bump  10,000  At 21:00 last night   This is 30 min after cardiac arrest   This goes along that ischemia/injury was happending long before the arrhythmic event    Predisposed to event     Echo in 2014 showed EF was 30 to 35%   On discussion with Dr Radford Pax the pt had another echo that showed normalization of the LVEF   (felt tachy induced.   Was probably done as outpt) REview of EKG with subtle ST changes , not diagnostic CT with evid of CAD LVEF is now down   With wall motion abnormalities    I reviewed wht pt  Causes   Will review again when family arrives  2  Atrial fibrillation  Pt has a hx of chronic atrial fibrillation   Converted to SR after defibrillation   He remains in SR   On heparin   Follow with tele   5 ID Empiric ABX since admit   WBC now 9.6  Pt afebrile   ? Urin    6  Pulmonary   Now extubated   Doing OK on 2 L   Follow       ADDENDUM:  2:30 PM  Spoke with pt and family (via phone ) about pt's current cardiac condition. With recent MI and echo showing decreased LV function with regional wall motion changes concern for CAD.  ( Anatomy in 2014 without significant CAD)   Concern if there is residual stenoses and he has further events his LVEF would decline  further.    Patient says he would like to live aas long as possible Discussed L heart cath and possible PTCA / stent if a discrete critical narrowing was present    He would not be a candidate for elective CABG.  Discussed risks and benefits   Pt and family understand   Pt would like to proceed with L heart cath with possible PTCA/stent tomorrow.  Signed, Dorris Carnes, MD  09/27/19

## 2019-09-28 ENCOUNTER — Inpatient Hospital Stay (HOSPITAL_COMMUNITY): Admission: EM | Disposition: E | Payer: Self-pay | Source: Skilled Nursing Facility | Attending: Internal Medicine

## 2019-09-28 DIAGNOSIS — I2510 Atherosclerotic heart disease of native coronary artery without angina pectoris: Secondary | ICD-10-CM | POA: Diagnosis not present

## 2019-09-28 DIAGNOSIS — E039 Hypothyroidism, unspecified: Secondary | ICD-10-CM | POA: Diagnosis not present

## 2019-09-28 DIAGNOSIS — F0390 Unspecified dementia without behavioral disturbance: Secondary | ICD-10-CM | POA: Diagnosis not present

## 2019-09-28 DIAGNOSIS — I214 Non-ST elevation (NSTEMI) myocardial infarction: Secondary | ICD-10-CM | POA: Diagnosis not present

## 2019-09-28 DIAGNOSIS — I469 Cardiac arrest, cause unspecified: Secondary | ICD-10-CM | POA: Diagnosis not present

## 2019-09-28 DIAGNOSIS — I5032 Chronic diastolic (congestive) heart failure: Secondary | ICD-10-CM | POA: Diagnosis not present

## 2019-09-28 HISTORY — PX: LEFT HEART CATH AND CORONARY ANGIOGRAPHY: CATH118249

## 2019-09-28 HISTORY — PX: CORONARY STENT INTERVENTION: CATH118234

## 2019-09-28 LAB — POCT ACTIVATED CLOTTING TIME
Activated Clotting Time: 252 seconds
Activated Clotting Time: 373 seconds

## 2019-09-28 LAB — BASIC METABOLIC PANEL
Anion gap: 10 (ref 5–15)
BUN: 23 mg/dL (ref 8–23)
CO2: 22 mmol/L (ref 22–32)
Calcium: 8.2 mg/dL — ABNORMAL LOW (ref 8.9–10.3)
Chloride: 105 mmol/L (ref 98–111)
Creatinine, Ser: 0.94 mg/dL (ref 0.61–1.24)
GFR calc Af Amer: >60 mL/min (ref >60)
GFR calc non Af Amer: >60 mL/min (ref >60)
Glucose, Bld: 111 mg/dL — ABNORMAL HIGH (ref 70–99)
Potassium: 4.3 mmol/L (ref 3.5–5.1)
Sodium: 137 mmol/L (ref 135–145)

## 2019-09-28 LAB — CBC WITH DIFFERENTIAL/PLATELET
Abs Immature Granulocytes: 0.09 10*3/uL — ABNORMAL HIGH (ref 0.00–0.07)
Basophils Absolute: 0 10*3/uL (ref 0.0–0.1)
Basophils Relative: 0 %
Eosinophils Absolute: 0.1 10*3/uL (ref 0.0–0.5)
Eosinophils Relative: 1 %
HCT: 33.1 % — ABNORMAL LOW (ref 39.0–52.0)
Hemoglobin: 11.5 g/dL — ABNORMAL LOW (ref 13.0–17.0)
Immature Granulocytes: 1 %
Lymphocytes Relative: 12 %
Lymphs Abs: 1.2 10*3/uL (ref 0.7–4.0)
MCH: 34.4 pg — ABNORMAL HIGH (ref 26.0–34.0)
MCHC: 34.7 g/dL (ref 30.0–36.0)
MCV: 99.1 fL (ref 80.0–100.0)
Monocytes Absolute: 0.9 10*3/uL (ref 0.1–1.0)
Monocytes Relative: 9 %
Neutro Abs: 7.3 10*3/uL (ref 1.7–7.7)
Neutrophils Relative %: 77 %
Platelets: 195 10*3/uL (ref 150–400)
RBC: 3.34 MIL/uL — ABNORMAL LOW (ref 4.22–5.81)
RDW: 13.1 % (ref 11.5–15.5)
WBC: 9.5 10*3/uL (ref 4.0–10.5)
nRBC: 0 % (ref 0.0–0.2)

## 2019-09-28 SURGERY — LEFT HEART CATH AND CORONARY ANGIOGRAPHY
Anesthesia: LOCAL

## 2019-09-28 MED ORDER — SODIUM CHLORIDE 0.9% FLUSH: 3.0000 mL | INTRAVENOUS | Status: DC | PRN

## 2019-09-28 MED ORDER — SODIUM CHLORIDE 0.9 % IV SOLN: 250.0000 mL | INTRAVENOUS | Status: DC | PRN

## 2019-09-28 MED ORDER — IOHEXOL 350 MG/ML SOLN
INTRAVENOUS | Status: DC | PRN
  Administered 2019-09-28: 135 mL via INTRA_ARTERIAL

## 2019-09-28 MED ORDER — CLOPIDOGREL BISULFATE 300 MG PO TABS
ORAL_TABLET | ORAL | Status: DC | PRN
  Administered 2019-09-28: 600 mg via ORAL

## 2019-09-28 MED ORDER — NITROGLYCERIN 1 MG/10 ML FOR IR/CATH LAB
INTRA_ARTERIAL | Status: AC
  Filled 2019-09-28: qty 10

## 2019-09-28 MED ORDER — RIVAROXABAN 15 MG PO TABS: 15.0000 mg | ORAL_TABLET | Freq: Every day | ORAL | Status: DC

## 2019-09-28 MED ORDER — MIDAZOLAM HCL 2 MG/2ML IJ SOLN
INTRAMUSCULAR | Status: AC
  Filled 2019-09-28: qty 2

## 2019-09-28 MED ORDER — VERAPAMIL HCL 2.5 MG/ML IV SOLN
INTRAVENOUS | Status: AC
  Filled 2019-09-28: qty 2

## 2019-09-28 MED ORDER — MIDAZOLAM HCL 2 MG/2ML IJ SOLN
INTRAMUSCULAR | Status: DC | PRN
  Administered 2019-09-28: 0.5 mg via INTRAVENOUS

## 2019-09-28 MED ORDER — ACETAMINOPHEN 325 MG PO TABS: 650.0000 mg | ORAL_TABLET | ORAL | Status: DC | PRN

## 2019-09-28 MED ORDER — FENTANYL CITRATE (PF) 100 MCG/2ML IJ SOLN
INTRAMUSCULAR | Status: DC | PRN
  Administered 2019-09-28: 25 ug via INTRAVENOUS

## 2019-09-28 MED ORDER — SODIUM CHLORIDE 0.9% FLUSH
3.0000 mL | Freq: Two times a day (BID) | INTRAVENOUS | Status: DC
  Administered 2019-09-28 – 2019-10-03 (×8): 3 mL via INTRAVENOUS

## 2019-09-28 MED ORDER — LABETALOL HCL 5 MG/ML IV SOLN: 10.0000 mg | INTRAVENOUS | Status: DC | PRN

## 2019-09-28 MED ORDER — FENTANYL CITRATE (PF) 100 MCG/2ML IJ SOLN
INTRAMUSCULAR | Status: AC
  Filled 2019-09-28: qty 2

## 2019-09-28 MED ORDER — HEPARIN SODIUM (PORCINE) 1000 UNIT/ML IJ SOLN
INTRAMUSCULAR | Status: DC | PRN
  Administered 2019-09-28: 5000 [IU] via INTRAVENOUS
  Administered 2019-09-28: 2000 [IU] via INTRAVENOUS
  Administered 2019-09-28: 3000 [IU] via INTRAVENOUS

## 2019-09-28 MED ORDER — FAMOTIDINE IN NACL 20-0.9 MG/50ML-% IV SOLN
INTRAVENOUS | Status: AC
  Filled 2019-09-28: qty 50

## 2019-09-28 MED ORDER — HYDRALAZINE HCL 20 MG/ML IJ SOLN: 10.0000 mg | INTRAMUSCULAR | Status: DC | PRN

## 2019-09-28 MED ORDER — TIROFIBAN HCL IN NACL 5-0.9 MG/100ML-% IV SOLN
INTRAVENOUS | Status: AC | PRN
  Administered 2019-09-28: 0.075 ug/kg/min via INTRAVENOUS

## 2019-09-28 MED ORDER — TIROFIBAN HCL IN NACL 5-0.9 MG/100ML-% IV SOLN
INTRAVENOUS | Status: AC
  Filled 2019-09-28: qty 100

## 2019-09-28 MED ORDER — VERAPAMIL HCL 2.5 MG/ML IV SOLN
INTRAVENOUS | Status: DC | PRN
  Administered 2019-09-28: 10 mL via INTRA_ARTERIAL

## 2019-09-28 MED ORDER — TIROFIBAN (AGGRASTAT) BOLUS VIA INFUSION
INTRAVENOUS | Status: DC | PRN
  Administered 2019-09-28: 1632.5 ug via INTRAVENOUS

## 2019-09-28 MED ORDER — ASPIRIN 81 MG PO CHEW
81.0000 mg | CHEWABLE_TABLET | Freq: Every day | ORAL | Status: DC
  Administered 2019-09-28 – 2019-10-02 (×5): 81 mg via ORAL
  Filled 2019-09-28 (×5): qty 1

## 2019-09-28 MED ORDER — HEPARIN SODIUM (PORCINE) 1000 UNIT/ML IJ SOLN
INTRAMUSCULAR | Status: AC
  Filled 2019-09-28: qty 1

## 2019-09-28 MED ORDER — SODIUM CHLORIDE 0.9 % WEIGHT BASED INFUSION: 1.0000 mL/kg/h | INTRAVENOUS | Status: AC

## 2019-09-28 MED ORDER — LIDOCAINE HCL (PF) 1 % IJ SOLN
INTRAMUSCULAR | Status: DC | PRN
  Administered 2019-09-28: 2 mL via INTRADERMAL

## 2019-09-28 MED ORDER — ROSUVASTATIN CALCIUM 20 MG PO TABS
20.0000 mg | ORAL_TABLET | Freq: Every day | ORAL | Status: DC
  Administered 2019-09-28 – 2019-10-02 (×3): 20 mg via ORAL
  Filled 2019-09-28 (×4): qty 1

## 2019-09-28 MED ORDER — LIDOCAINE HCL (PF) 1 % IJ SOLN
INTRAMUSCULAR | Status: AC
  Filled 2019-09-28: qty 30

## 2019-09-28 MED ORDER — HEPARIN (PORCINE) IN NACL 1000-0.9 UT/500ML-% IV SOLN
INTRAVENOUS | Status: AC
  Filled 2019-09-28: qty 1000

## 2019-09-28 MED ORDER — NITROGLYCERIN 1 MG/10 ML FOR IR/CATH LAB
INTRA_ARTERIAL | Status: DC | PRN
  Administered 2019-09-28: 100 ug via INTRACORONARY

## 2019-09-28 MED ORDER — ATORVASTATIN CALCIUM 80 MG PO TABS
80.0000 mg | ORAL_TABLET | Freq: Every day | ORAL | Status: DC
  Administered 2019-09-28: 18:00:00 80 mg via ORAL
  Filled 2019-09-28: qty 1

## 2019-09-28 MED ORDER — HEPARIN (PORCINE) IN NACL 1000-0.9 UT/500ML-% IV SOLN
INTRAVENOUS | Status: DC | PRN
  Administered 2019-09-28 (×2): 500 mL

## 2019-09-28 MED ORDER — CLOPIDOGREL BISULFATE 75 MG PO TABS
75.0000 mg | ORAL_TABLET | Freq: Every day | ORAL | Status: DC
  Administered 2019-09-29 – 2019-10-02 (×4): 75 mg via ORAL
  Filled 2019-09-28 (×4): qty 1

## 2019-09-28 MED ORDER — CLOPIDOGREL BISULFATE 300 MG PO TABS
ORAL_TABLET | ORAL | Status: AC
  Filled 2019-09-28: qty 2

## 2019-09-28 MED ORDER — APIXABAN 5 MG PO TABS
5.0000 mg | ORAL_TABLET | Freq: Two times a day (BID) | ORAL | Status: DC
  Administered 2019-09-28 – 2019-09-30 (×4): 5 mg via ORAL
  Filled 2019-09-28 (×4): qty 1

## 2019-09-28 MED ORDER — FAMOTIDINE IN NACL 20-0.9 MG/50ML-% IV SOLN
INTRAVENOUS | Status: AC | PRN
  Administered 2019-09-28: 20 mg via INTRAVENOUS

## 2019-09-28 MED ORDER — ONDANSETRON HCL 4 MG/2ML IJ SOLN: 4.0000 mg | Freq: Four times a day (QID) | INTRAMUSCULAR | Status: DC | PRN

## 2019-09-28 SURGICAL SUPPLY — 20 items
BALLN SAPPHIRE 2.5X12 (BALLOONS) ×2
BALLN SAPPHIRE ~~LOC~~ 2.75X12 (BALLOONS) ×1 IMPLANT
BALLN SAPPHIRE ~~LOC~~ 3.0X12 (BALLOONS) ×1 IMPLANT
BALLOON SAPPHIRE 2.5X12 (BALLOONS) IMPLANT
CATH 5FR JL3.5 JR4 ANG PIG MP (CATHETERS) ×1 IMPLANT
CATH LAUNCHER 6FR EBU 3 (CATHETERS) ×1 IMPLANT
CATH VISTA GUIDE 6FR XB3.5 (CATHETERS) ×1 IMPLANT
DEVICE RAD COMP TR BAND LRG (VASCULAR PRODUCTS) ×1 IMPLANT
ELECT DEFIB PAD ADLT CADENCE (PAD) ×1 IMPLANT
GLIDESHEATH SLEND A-KIT 6F 22G (SHEATH) ×1 IMPLANT
GUIDEWIRE INQWIRE 1.5J.035X260 (WIRE) IMPLANT
INQWIRE 1.5J .035X260CM (WIRE) ×2
KIT ENCORE 26 ADVANTAGE (KITS) ×1 IMPLANT
KIT HEART LEFT (KITS) ×2 IMPLANT
PACK CARDIAC CATHETERIZATION (CUSTOM PROCEDURE TRAY) ×2 IMPLANT
SHEATH PROBE COVER 6X72 (BAG) ×1 IMPLANT
STENT RESOLUTE ONYX 2.5X18 (Permanent Stent) ×1 IMPLANT
TRANSDUCER W/STOPCOCK (MISCELLANEOUS) ×2 IMPLANT
TUBING CIL FLEX 10 FLL-RA (TUBING) ×2 IMPLANT
WIRE ASAHI PROWATER 180CM (WIRE) ×1 IMPLANT

## 2019-09-28 NOTE — Interval H&P Note (Signed)
Cath Lab Visit (complete for each Cath Lab visit)  Clinical Evaluation Leading to the Procedure:   ACS: Yes.    Non-ACS:    Anginal Classification: CCS [name]  Anti-ischemic medical therapy: Minimal Therapy (1 class of medications)  Non-Invasive Test Results: Intermediate-risk stress test findings: cardiac mortality 1-3%/year  Prior CABG: No previous CABG      History and Physical Interval Note:  10/11/2019 7:37 AM  Cameron [name]  has presented today for surgery, with the diagnosis of MI.  The various methods of treatment have been discussed with the patient and family. After consideration of risks, benefits and other options for treatment, the patient has consented to  Procedure(s): LEFT HEART CATH AND CORONARY ANGIOGRAPHY (N/A) as a surgical intervention.  The patient's history has been reviewed, patient examined, no change in status, stable for surgery.  I have reviewed the patient's chart and labs.  Questions were answered to the patient's satisfaction.     Cameron [name]

## 2019-09-28 NOTE — Progress Notes (Addendum)
Progress Note  Patient Name: Cameron [name] Date of Encounter: 10/17/2019  Primary Cardiologist: New    Subjective   Patient denies CP   Breathing is fair  Little congestion  He says he never had CP    Inpatient Medications    Scheduled Meds: . aspirin  81 mg Oral Daily  . atorvastatin  80 mg Oral q1800  . Chlorhexidine Gluconate Cloth  6 each Topical Daily  . [START ON 09/29/2019] clopidogrel  75 mg Oral Q breakfast  . diltiazem  120 mg Oral Daily  . erythromycin  1 application Left Eye QID  . feeding supplement (ENSURE ENLIVE)  237 mL Oral TID BM  . levothyroxine  75 mcg Per Tube Q0600  . pantoprazole  40 mg Oral Daily  . [START ON 09/29/2019] rivaroxaban  15 mg Oral Daily  . sodium chloride flush  3 mL Intravenous Q12H  . tamsulosin  0.4 mg Oral Q supper  . vitamin B-12  500 mcg Oral Once per day on Mon Thu   Continuous Infusions: . sodium chloride    . sodium chloride 1 mL/kg/hr (10/21/2019 0934)  . cefTRIAXone (ROCEPHIN)  IV 1 g (10/20/2019 0412)   PRN Meds: sodium chloride, acetaminophen, albuterol, hydrALAZINE, labetalol, ondansetron (ZOFRAN) IV, polyethylene glycol, sodium chloride flush   Vital Signs    Vitals:   10/22/2019 1215 10/09/2019 1230 10/01/2019 1245 10/23/2019 1300  BP: (!) 100/57 (!) 109/57 107/61 103/82  Pulse:   (!) 113 (!) 54  Resp: (!) 21 13 13 18   Temp:    98 F (36.7 C)  TempSrc:    Axillary  SpO2:   (!) 74% (!) 79%  Weight:      Height:        Intake/Output Summary (Last 24 hours) at 10/22/2019 1421 Last data filed at 10/12/2019 0700 Gross per 24 hour  Intake 100 ml  Output 500 ml  Net -400 ml   Last 3 Weights 09/27/2019 09/27/2019 09/26/2019  Weight (lbs) 143 lb 15.4 oz 141 lb 12.1 oz 146 lb 2.6 oz  Weight (kg) 65.3 kg 64.3 kg 66.3 kg      Telemetry     SR with PACs- Personally Reviewed  ECG      Physical Exam   GEN: Frail 86 yo in no acute distress.   Neck: JVP is normal   Cardiac: RRR, no murmurs, rubs, or gallops.  Respiratory:  Clear to auscultation GI: Soft, nontender, non-distended  MS: No edema; No deformity   R arm in arm band. Neuro:  Nonfocal    Labs    High Sensitivity Troponin:   Recent Labs  Lab 09/24/19 2103 09/24/19 2346  TROPONINIHS 10,382* 9,437*      Chemistry Recent Labs  Lab 08/30/2019 2252 09/06/2019 2252 09/24/19 2103 09/24/19 2138 09/25/19 0435 09/25/19 0435 09/26/19 0940 09/27/19 0653 09/29/2019 0237  NA 131*   < > 133*   < > 135   < > 134* 136 137  K 4.0   < > 3.7   < > 4.6   < > 3.8 3.8 4.3  CL 98   < > 103   < > 102   < > 103 103 105  CO2 20*   < > 18*   < > 21*   < > 19* 21* 22  GLUCOSE 141*   < > 130*   < > 124*   < > 96 88 111*  BUN 18   < > 19   < >  20   < > 25* 24* 23  CREATININE 1.21   < > 1.26*   < > 1.11   < > 1.15 1.20 0.94  CALCIUM 8.8*   < > 8.2*   < > 8.6*   < > 8.1* 8.3* 8.2*  PROT 6.6  --  5.8*  --  6.5  --   --   --   --   ALBUMIN 2.7*  --  2.3*  --  2.6*  --   --   --   --   AST 175*  --  123*  --  93*  --   --   --   --   ALT 37  --  74*  --  68*  --   --   --   --   ALKPHOS 46  --  47  --  49  --   --   --   --   BILITOT 0.9  --  1.2  --  0.9  --   --   --   --   GFRNONAA 52*   < > 50*   < > 58*   < > 55* 53* >60  GFRAA >60   < > 57*   < > >60   < > >60 >60 >60  ANIONGAP 13   < > 12   < > 12   < > 12 12 10    < > = values in this interval not displayed.     Hematology Recent Labs  Lab 09/26/19 0940 09/27/19 0653 10/07/2019 0237  WBC 10.5 9.6 9.5  RBC 3.22* 3.15* 3.34*  HGB 10.8* 10.7* 11.5*  HCT 31.8* 31.8* 33.1*  MCV 98.8 101.0* 99.1  MCH 33.5 34.0 34.4*  MCHC 34.0 33.6 34.7  RDW 13.2 13.4 13.1  PLT 193 192 195    BNPNo results for input(s): BNP, PROBNP in the last 168 hours.   DDimer No results for input(s): DDIMER in the last 168 hours.   Radiology    CARDIAC CATHETERIZATION  Result Date: 10/14/2019  Non-ST elevation myocardial infarction complicated by polymorphic ventricular tachycardia/cardiac arrest.  Successful resuscitation.   99% proximal dominant circumflex with reduced TIMI flow (grade II -III) was treated with PCI and stent reducing the stenosis to 0% with TIMI grade III flow (18 x 2.5 mm Onyx postdilated to 3.0 mm in diameter.  Separate left coronary ostia.  Proximal LAD contains eccentric 50% stenosis.  Circumflex is as noted above.  Right coronary is nondominant.  There is ostial 50% narrowing.  Inferobasal akinesis extending to the mid inferior wall.  EF 40 to 45%.  LVEDP 10 mmHg. Recommendation:  Aspirin, reduced dose Xarelto, and Plavix for 2 weeks then drop aspirin.  Combination of Xarelto and Plavix should be for 12 months.  Further management per team.  2 hours of Aggrastat to allow antiplatelet effect until absorption of Plavix.   Cardiac Studies    Patient Profile     86 y.o. male with hx of COPD, pulm fibrosis, diastolic CHF, perm atrial fibriillation, NICM in 2014 (with recovery of LVEF ) who presented feeling poorly, developed TdP.    Assessment & Plan     1   CAD   Troponin with signif bump  10,000  At 21:00 last night   This is 30 min after cardiac arrest   This goes along that ischemia/injury was happending long before the arrhythmic event    Predisposed to event  Cath today showed 99% lesion in prox/mid LCx which was large and dominant   LAD with 50 to 60% stenosis    Pt is s/p intervention to this     Plan for DAPT   2  Atrial fibrillation  Pt has a hx of chronic atrial fibrillation   Converted to SR after defibrillation   He remains in SR    Will need anticoagulation Switch to Eliquis   3  Torsades  No recurrence   Avoid drugs that prolong QT  5 ID Empiric ABX since admit   WBC now 9.6  Pt afebrile    Will complete 7 days  6  Pulmonary   Pt with baseline lung dz  Extubated and is oxygenating OK  7  HL   Lipds in AM  Start Crestor 20     Signed, Dorris Carnes, MD  09/27/19

## 2019-09-28 NOTE — Progress Notes (Signed)
Pt is undergoing L heart cath with Dr Tamala Julian    He has a critical lesion of an dominant  LCx     This appears to be culprit vessel   With residual flow though at risk for potential further damage Reviewed with family members plan for revascularizaiton attempt. They understand risks/benefits.   Agree to proceed with plans to rescind DNR status during this time period.  Spoke with [name] Hardaway who has POA  She agrees and supports decision.  Dorris Carnes MD

## 2019-09-28 NOTE — Progress Notes (Signed)
Received patient to room 4E13 from 2 Heart, patient altert and oriented to self without complaints. Tele monitor applied and CCMD notified. CHG bath given. Call bell within reach, will continue to monitor.

## 2019-09-28 NOTE — Plan of Care (Signed)
Will attempt to get verbal consent from family for heart cath closer to 0700. R groin and wrist have been clipped. CHG bath given at 0000--will give second one at 0600. Hemodynamically stable. Disoriented to place and situation. Resting comfortably. Will continue to monitor.   Problem: Clinical Measurements: Goal: Ability to maintain clinical measurements within normal limits will improve Outcome: Progressing Goal: Diagnostic test results will improve Outcome: Progressing Goal: Respiratory complications will improve Outcome: Progressing Goal: Cardiovascular complication will be avoided Outcome: Progressing   Problem: Coping: Goal: Level of anxiety will decrease Outcome: Progressing   Problem: Pain Managment: Goal: General experience of comfort will improve Outcome: Progressing

## 2019-09-28 NOTE — Progress Notes (Signed)
PROGRESS NOTE  Cameron [name] [id number] DOB: 14-Mar-1926 DOA: 09/04/2019 PCP: Lavone Orn, MD  HPI/Recap of past 24 hours: HPI from PCCM 86 year old man with history of HFpEF, COPD, atrial fibrillation on Xarelto, CAD, pulmonary fibrosis, GERD, hypothyroidism, recent hospitalization from 3/10-3/18 for facial cellulitis and hyponatremia presenting again with acute encephalopathy.  He has been having worse lethargy over the past few days.  There have been some reported falls at the facility.  Upon arrival to [facility] ED, he was found to have a UTI and started on antibiotics.  On evening of 3/28 he was found to be in pulseless Vtach.  CPR was initiated.  He was defibrillated x1.  ROSC was achieved after 4 minutes.  He had agonal breathing and was intubated extubated on 09/26/2019. Was previously on Lexapro, however given increase in Qtc >500 and Vtach, Lexapro was discontinued. Repeat EKG revealed Qtc of ~480s. Home Donepezil restarted on 3/29 and am QTc was 498. Held home Donepezil on 3/30 and 3/31 am QTc was 440.  Patient was subsequently stabilized.  [facility] assumed care on 09/27/2019.    Today, saw pt after LHC, denies any new complaints.    Assessment/Plan: Principal Problem:   Cardiac arrest [facility]) Active Problems:   Persistent atrial fibrillation (HCC)   Chronic diastolic HF (heart failure) (HCC)   Dementia (HCC)   Hyponatremia   Acute metabolic encephalopathy   Leukocytosis   Hypothyroidism   UTI (urinary tract infection)  Cardiac arrest/torsades Noted to have V. tach with defibrillation, prolonged QTC Intubated on 3/28, extubated on 3/30 Cardiology on board, appreciate recs Discontinue recently started Lexapro as well as home Aricept Continue to monitor electrolytes closely  NSTEMI complicated by polymorphic ventricular tachycardia/history of CAD/elevated troponin Echo done on 09/25/2019 showed EF of 35 to 40%, with regional wall motion  abnormalities Cardiology on board, status post LHC on 10/05/2019 which showed 99% proximal dominant circumflex status post PCI stent Continue aspirin, Eliquis, Plavix for ??2 weeks then stop aspirin.  Combination of Eliquis and Plavix should be for 12 months Start Crestor Follow-up with cardiology  Acute hypoxic respiratory failure Intubated on 3/28, extubated on 3/30 Continue pulmonary hygiene Supplemental oxygen prn Duo nebs as needed  UTI Currently afebrile, with no leukocytosis Blood culture x2 no growth to date UC grew E. Coli, sensitive to ceftriaxone Continue ceftriaxone, plan to DC on 4/2  Paroxysmal A. Fib Held home diltiazem, switch to Eliquis  Normocytic chronic anemia Hemoglobin baseline around 12 No obvious signs of bleeding Daily CBC  History of COPD Continue albuterol as needed, supplemental oxygen  Hypothyroidism Continue home Synthroid  BPH Continue home Flomax  Dementia Plan to DC Aricept due to noted prolonged QTC, V. tach and cardiac arrest            Malnutrition Type:  Nutrition Problem: Severe Malnutrition Etiology: chronic illness(COPD)   Malnutrition Characteristics:  Signs/Symptoms: severe fat depletion, severe muscle depletion, energy intake < or equal to 75% for > or equal to 1 month   Nutrition Interventions:  Interventions: Tube feeding    Estimated body mass index is 20.36 kg/m as calculated from the following:   Height as of this encounter: 5' 10.5" (1.791 m).   Weight as of this encounter: 65.3 kg.     Code Status: DNR  Family Communication: Discussed with daughter at bedside  Disposition Plan: Patient came from SNF, plan to DC to SNF as per recommendations from PT/OT.  TOC notified.  Cardiology clearance pending  Consultants:  Cardiology  PCCM  Procedures:  Mechanical ventilation  Antimicrobials:  Ceftriaxone  DVT prophylaxis: Eliquis   Objective: Vitals:   10/19/2019 1230 10/23/2019 1245  10/25/2019 1300 10/08/2019 1632  BP: (!) 109/57 107/61 103/82   Pulse:  (!) 113 (!) 54   Resp: 13 13 18    Temp:   98 F (36.7 C) 97.9 F (36.6 C)  TempSrc:   Axillary Oral  SpO2:  (!) 74% (!) 79%   Weight:      Height:        Intake/Output Summary (Last 24 hours) at 10/14/2019 1752 Last data filed at 10/11/2019 1600 Gross per 24 hour  Intake 568.63 ml  Output 750 ml  Net -181.37 ml   Filed Weights   09/26/19 0600 09/27/19 0300 09/27/19 1912  Weight: 66.3 kg 64.3 kg 65.3 kg    Exam:  General: NAD  Cardiovascular: S1, S2 present  Respiratory:  Mild bibasilar crackles noted  Abdomen: Soft, nontender, nondistended, bowel sounds present  Musculoskeletal: No bilateral pedal edema noted  Skin: Normal  Psychiatry: Normal mood   Data Reviewed: CBC: Recent Labs  Lab 09/05/2019 2252 09/14/2019 2252 09/24/19 2103 09/24/19 2103 09/24/19 2138 09/25/19 0435 09/26/19 0940 09/27/19 0653 10/25/2019 0237  WBC 17.0*   < > 14.0*  --   --  13.6* 10.5 9.6 9.5  NEUTROABS 13.9*  --   --   --   --  11.1*  --   --  7.3  HGB 13.0   < > 12.6*   < > 11.6* 13.3 10.8* 10.7* 11.5*  HCT 37.3*   < > 37.7*   < > 34.0* 39.7 31.8* 31.8* 33.1*  MCV 98.2   < > 102.4*  --   --  101.0* 98.8 101.0* 99.1  PLT 222   < > 236  --   --  217 193 192 195   < > = values in this interval not displayed.   Basic Metabolic Panel: Recent Labs  Lab 09/24/19 2103 09/24/19 2103 09/24/19 2138 09/25/19 0435 09/26/19 0940 09/27/19 0653 10/17/2019 0237  NA 133*   < > 135 135 134* 136 137  K 3.7   < > 3.5 4.6 3.8 3.8 4.3  CL 103  --   --  102 103 103 105  CO2 18*  --   --  21* 19* 21* 22  GLUCOSE 130*  --   --  124* 96 88 111*  BUN 19  --   --  20 25* 24* 23  CREATININE 1.26*  --   --  1.11 1.15 1.20 0.94  CALCIUM 8.2*  --   --  8.6* 8.1* 8.3* 8.2*  MG 1.9  --   --  2.4  --  2.3  --   PHOS  --   --   --  3.7  --   --   --    < > = values in this interval not displayed.   GFR: Estimated Creatinine Clearance:  47.3 mL/min (by C-G formula based on SCr of 0.94 mg/dL). Liver Function Tests: Recent Labs  Lab 09/24/2019 2252 09/24/19 2103 09/25/19 0435  AST 175* 123* 93*  ALT 37 74* 68*  ALKPHOS 46 47 49  BILITOT 0.9 1.2 0.9  PROT 6.6 5.8* 6.5  ALBUMIN 2.7* 2.3* 2.6*   No results for input(s): LIPASE, AMYLASE in the last 168 hours. No results for input(s): AMMONIA in the last 168 hours. Coagulation Profile: Recent Labs  Lab 09/01/2019  2252  INR 3.1*   Cardiac Enzymes: No results for input(s): CKTOTAL, CKMB, CKMBINDEX, TROPONINI in the last 168 hours. BNP (last 3 results) No results for input(s): PROBNP in the last 8760 hours. HbA1C: No results for input(s): HGBA1C in the last 72 hours. CBG: Recent Labs  Lab 09/24/19 2041 09/24/19 2136  GLUCAP 132* 124*   Lipid Profile: No results for input(s): CHOL, HDL, LDLCALC, TRIG, CHOLHDL, LDLDIRECT in the last 72 hours. Thyroid Function Tests: No results for input(s): TSH, T4TOTAL, FREET4, T3FREE, THYROIDAB in the last 72 hours. Anemia Panel: No results for input(s): VITAMINB12, FOLATE, FERRITIN, TIBC, IRON, RETICCTPCT in the last 72 hours. Urine analysis:    Component Value Date/Time   COLORURINE YELLOW 09/24/2019 0212   APPEARANCEUR TURBID (A) 09/24/2019 0212   LABSPEC 1.023 09/24/2019 0212   PHURINE 6.0 09/24/2019 0212   GLUCOSEU NEGATIVE 09/24/2019 0212   HGBUR LARGE (A) 09/24/2019 0212   BILIRUBINUR NEGATIVE 09/24/2019 0212   KETONESUR NEGATIVE 09/24/2019 0212   PROTEINUR 100 (A) 09/24/2019 0212   UROBILINOGEN 0.2 07/12/2013 2230   NITRITE POSITIVE (A) 09/24/2019 0212   LEUKOCYTESUR MODERATE (A) 09/24/2019 0212   Sepsis Labs: @LABRCNTIP (procalcitonin:4,lacticidven:4)  ) Recent Results (from the past 240 hour(s))  Urine culture     Status: Abnormal   Collection Time: 09/24/19  2:11 AM   Specimen: Urine, Catheterized  Result Value Ref Range Status   Specimen Description URINE, CATHETERIZED  Final   Special Requests   Final     NONE Performed at [facility], [address] [place], [place] [address]    Culture >=100,000 COLONIES/mL ESCHERICHIA COLI (A)  Final   Report Status 09/26/2019 FINAL  Final   Organism ID, Bacteria ESCHERICHIA COLI (A)  Final      Susceptibility   Escherichia coli - MIC*    AMPICILLIN >=32 RESISTANT Resistant     CEFAZOLIN >=64 RESISTANT Resistant     CEFTRIAXONE 0.5 SENSITIVE Sensitive     CIPROFLOXACIN >=4 RESISTANT Resistant     GENTAMICIN <=1 SENSITIVE Sensitive     IMIPENEM <=0.25 SENSITIVE Sensitive     NITROFURANTOIN <=16 SENSITIVE Sensitive     TRIMETH/SULFA <=20 SENSITIVE Sensitive     AMPICILLIN/SULBACTAM >=32 RESISTANT Resistant     PIP/TAZO >=128 RESISTANT Resistant     * >=100,000 COLONIES/mL ESCHERICHIA COLI  Blood culture (routine x 2)     Status: None (Preliminary result)   Collection Time: 09/24/19  3:50 AM   Specimen: BLOOD  Result Value Ref Range Status   Specimen Description BLOOD RIGHT ARM  Final   Special Requests   Final    BOTTLES DRAWN AEROBIC AND ANAEROBIC Blood Culture adequate volume   Culture   Final    NO GROWTH 4 DAYS Performed at [facility], [address], [place], [place] [address]    Report Status PENDING  Incomplete  Blood culture (routine x 2)     Status: None (Preliminary result)   Collection Time: 09/24/19  3:50 AM   Specimen: BLOOD  Result Value Ref Range Status   Specimen Description BLOOD LEFT ARM  Final   Special Requests   Final    BOTTLES DRAWN AEROBIC AND ANAEROBIC Blood Culture adequate volume   Culture   Final    NO GROWTH 4 DAYS Performed at [facility], [place] [address], [place], [place] [address]    Report Status PENDING  Incomplete  SARS CORONAVIRUS 2 (TAT 6-24 HRS) Nasopharyngeal Nasopharyngeal Swab  Status: None   Collection Time: 09/24/19  4:27 AM   Specimen: Nasopharyngeal Swab  Result Value Ref Range Status   SARS Coronavirus 2 NEGATIVE NEGATIVE Final    Comment: (NOTE) SARS-CoV-2  target nucleic acids are NOT DETECTED. The SARS-CoV-2 RNA is generally detectable in upper and lower respiratory specimens during the acute phase of infection. Negative results do not preclude SARS-CoV-2 infection, do not rule out co-infections with other pathogens, and should not be used as the sole basis for treatment or other patient management decisions. Negative results must be combined with clinical observations, patient history, and epidemiological information. The expected result is Negative. Fact Sheet for Patients: SugarRoll.be Fact Sheet for Healthcare Providers: https://www.woods-mathews.com/ This test is not yet approved or cleared by the [place] FDA and  has been authorized for detection and/or diagnosis of SARS-CoV-2 by FDA under an Emergency Use Authorization (EUA). This EUA will remain  in effect (meaning this test can be used) for the duration of the COVID-19 declaration under Section 56 4(b)(1) of the Act, 21 U.S.C. section 360bbb-3(b)(1), unless the authorization is terminated or revoked sooner. Performed at [facility], [place] [address] [place], [place] [address]   MRSA PCR Screening     Status: None   Collection Time: 09/24/19  9:30 PM   Specimen: Nasopharyngeal  Result Value Ref Range Status   MRSA by PCR NEGATIVE NEGATIVE Final    Comment:        The GeneXpert MRSA Assay (FDA approved for NASAL specimens only), is one component of a comprehensive MRSA colonization surveillance program. It is not intended to diagnose MRSA infection nor to guide or monitor treatment for MRSA infections. Performed at [facility], [place] [address] [place], [place] [address]       Studies: CARDIAC CATHETERIZATION  Result Date: 10/18/2019  Non-ST elevation myocardial infarction complicated by polymorphic ventricular tachycardia/cardiac arrest.  Successful resuscitation.  99% proximal dominant circumflex with  reduced TIMI flow (grade II -III) was treated with PCI and stent reducing the stenosis to 0% with TIMI grade III flow (18 x 2.5 mm Onyx postdilated to 3.0 mm in diameter.  Separate left coronary ostia.  Proximal LAD contains eccentric 50% stenosis.  Circumflex is as noted above.  Right coronary is nondominant.  There is ostial 50% narrowing.  Inferobasal akinesis extending to the mid inferior wall.  EF 40 to 45%.  LVEDP 10 mmHg. Recommendation:  Aspirin, reduced dose Xarelto, and Plavix for 2 weeks then drop aspirin.  Combination of Xarelto and Plavix should be for 12 months.  Further management per team.  2 hours of Aggrastat to allow antiplatelet effect until absorption of Plavix.   Scheduled Meds: . apixaban  5 mg Oral BID  . aspirin  81 mg Oral Daily  . atorvastatin  80 mg Oral q1800  . Chlorhexidine Gluconate Cloth  6 each Topical Daily  . [START ON 09/29/2019] clopidogrel  75 mg Oral Q breakfast  . erythromycin  1 application Left Eye QID  . feeding supplement (ENSURE ENLIVE)  237 mL Oral TID BM  . levothyroxine  75 mcg Per Tube Q0600  . pantoprazole  40 mg Oral Daily  . rosuvastatin  20 mg Oral Daily  . sodium chloride flush  3 mL Intravenous Q12H  . tamsulosin  0.4 mg Oral Q supper  . vitamin B-12  500 mcg Oral Once per day on Mon Thu    Continuous Infusions: . sodium chloride    . sodium  chloride 1 mL/kg/hr (10/01/2019 0934)  . cefTRIAXone (ROCEPHIN)  IV 1 g (10/20/2019 0412)     LOS: 4 days     Alma Friendly, MD [facility]  If 7PM-7AM, please contact night-coverage [email or web address] 10/21/2019, 5:52 PM

## 2019-09-28 NOTE — Progress Notes (Signed)
Cath procedure has been done. Dr Tamala Julian would like patient to stay on 2H.

## 2019-09-28 NOTE — CV Procedure (Addendum)
   Coronary angiography, left heart cath, and stent culprit proximal circumflex 99% stenosis to 0%.  Right radial access using real-time vascular ultrasound for guidance.  Proximal 50 to 60% LAD  Nondominant right coronary  Dominant circumflex initially with 99% proximal to mid stenosis (culprit).  Inferobasal and mid inferior wall akinesis.  Discussed with family, PCI consented with power of attorney, DNR was temporarily rescinded to allow PCI and management of any arrhythmias should they occur.

## 2019-09-28 NOTE — Progress Notes (Signed)
Patient disoriented, removed gown, removed IV, trying to get out of bed. Son and daughter in law called to help with some reorientation. Patient currently laying in bed with eyes closed, bed alarm on. Will continue to monitor.

## 2019-09-28 DEATH — deceased

## 2019-09-29 ENCOUNTER — Inpatient Hospital Stay (HOSPITAL_COMMUNITY): Payer: PPO

## 2019-09-29 DIAGNOSIS — I469 Cardiac arrest, cause unspecified: Secondary | ICD-10-CM | POA: Diagnosis not present

## 2019-09-29 DIAGNOSIS — F0390 Unspecified dementia without behavioral disturbance: Secondary | ICD-10-CM | POA: Diagnosis not present

## 2019-09-29 DIAGNOSIS — I5032 Chronic diastolic (congestive) heart failure: Secondary | ICD-10-CM | POA: Diagnosis not present

## 2019-09-29 DIAGNOSIS — E039 Hypothyroidism, unspecified: Secondary | ICD-10-CM | POA: Diagnosis not present

## 2019-09-29 LAB — CULTURE, BLOOD (ROUTINE X 2)
Culture: NO GROWTH
Culture: NO GROWTH
Special Requests: ADEQUATE
Special Requests: ADEQUATE

## 2019-09-29 LAB — CBC WITH DIFFERENTIAL/PLATELET
Abs Immature Granulocytes: 0.14 10*3/uL — ABNORMAL HIGH (ref 0.00–0.07)
Basophils Absolute: 0 10*3/uL (ref 0.0–0.1)
Basophils Relative: 1 %
Eosinophils Absolute: 0.1 10*3/uL (ref 0.0–0.5)
Eosinophils Relative: 1 %
HCT: 36.3 % — ABNORMAL LOW (ref 39.0–52.0)
Hemoglobin: 12.1 g/dL — ABNORMAL LOW (ref 13.0–17.0)
Immature Granulocytes: 2 %
Lymphocytes Relative: 9 %
Lymphs Abs: 0.8 10*3/uL (ref 0.7–4.0)
MCH: 33.7 pg (ref 26.0–34.0)
MCHC: 33.3 g/dL (ref 30.0–36.0)
MCV: 101.1 fL — ABNORMAL HIGH (ref 80.0–100.0)
Monocytes Absolute: 0.7 10*3/uL (ref 0.1–1.0)
Monocytes Relative: 9 %
Neutro Abs: 6.8 10*3/uL (ref 1.7–7.7)
Neutrophils Relative %: 78 %
Platelets: 243 10*3/uL (ref 150–400)
RBC: 3.59 MIL/uL — ABNORMAL LOW (ref 4.22–5.81)
RDW: 13.2 % (ref 11.5–15.5)
WBC: 8.7 10*3/uL (ref 4.0–10.5)
nRBC: 0 % (ref 0.0–0.2)

## 2019-09-29 LAB — LIPID PANEL
Cholesterol: 110 mg/dL (ref 0–200)
HDL: 21 mg/dL — ABNORMAL LOW (ref >40)
LDL Cholesterol: 76 mg/dL (ref 0–99)
Total CHOL/HDL Ratio: 5.2 RATIO
Triglycerides: 65 mg/dL (ref <150)
VLDL: 13 mg/dL (ref 0–40)

## 2019-09-29 LAB — BASIC METABOLIC PANEL
Anion gap: 12 (ref 5–15)
BUN: 16 mg/dL (ref 8–23)
CO2: 21 mmol/L — ABNORMAL LOW (ref 22–32)
Calcium: 8.3 mg/dL — ABNORMAL LOW (ref 8.9–10.3)
Chloride: 106 mmol/L (ref 98–111)
Creatinine, Ser: 1.06 mg/dL (ref 0.61–1.24)
GFR calc Af Amer: >60 mL/min (ref >60)
GFR calc non Af Amer: >60 mL/min (ref >60)
Glucose, Bld: 113 mg/dL — ABNORMAL HIGH (ref 70–99)
Potassium: 3.5 mmol/L (ref 3.5–5.1)
Sodium: 139 mmol/L (ref 135–145)

## 2019-09-29 MED ORDER — LOSARTAN POTASSIUM 25 MG PO TABS
12.5000 mg | ORAL_TABLET | Freq: Every day | ORAL | Status: DC
  Filled 2019-09-29: qty 1

## 2019-09-29 MED ORDER — METOPROLOL TARTRATE 25 MG PO TABS
25.0000 mg | ORAL_TABLET | Freq: Two times a day (BID) | ORAL | Status: DC
  Administered 2019-09-29 – 2019-09-30 (×3): 25 mg via ORAL
  Filled 2019-09-29 (×3): qty 1

## 2019-09-29 MED ORDER — METOPROLOL SUCCINATE ER 25 MG PO TB24: 25.0000 mg | ORAL_TABLET | Freq: Two times a day (BID) | ORAL | Status: DC

## 2019-09-29 MED ORDER — POTASSIUM CHLORIDE CRYS ER 20 MEQ PO TBCR
40.0000 meq | EXTENDED_RELEASE_TABLET | Freq: Once | ORAL | Status: AC
  Administered 2019-09-29: 15:00:00 40 meq via ORAL
  Filled 2019-09-29: qty 2

## 2019-09-29 MED FILL — Heparin Sodium (Porcine) Inj 1000 Unit/ML: INTRAMUSCULAR | Qty: 10 | Status: AC

## 2019-09-29 NOTE — Progress Notes (Signed)
Physical Therapy Treatment Patient Details Name: Cameron [name] MRN: [id number] DOB: April 10, 1926 Today's Date: 09/29/2019    History of Present Illness 86 year old male with past medical history of gastroesophageal reflux disease, COPD, chronic atrial fibrillation on anticoagulation, diastolic congestive heart failure and recent admission to [facility] for odontogenic facial cellulitis who presents to [facility] emergency department after exhibiting a several day history of progressively worsening lethargy and confusion.    PT Comments    Paitent seen for mobility progression. Pt in bed upon arrival and agreeable to participate in therapy. Pt requires mod A +2 for functional transfer training and min-mod (+2 for safety) for gait training. Pt ambulated ~4 ft from EOB and requires seated break due to increased WOB and elevated RR. Pt fatigued with minimal mobility and replies "yes" when asked if he feels he is working hard. HR 110s-150s, A fib during session. SpO2 WNL on RA. Pt stood second time briefly and requested to sit back down. Further mobility deferred and pt up in recliner with daughter present.  PT will continue to follow acutely and progress as tolerated.    Follow Up Recommendations  SNF;Supervision/Assistance - 24 hour     Equipment Recommendations  Rolling walker with 5" wheels    Recommendations for Other Services       Precautions / Restrictions Precautions Precautions: Fall    Mobility  Bed Mobility Overal bed mobility: Needs Assistance Bed Mobility: Supine to Sit     Supine to sit: Mod assist     General bed mobility comments: mod A to guide BLEs to EOB and support trunk into sitting. Increased time and cueing needed for initiation  Transfers Overall transfer level: Needs assistance Equipment used: Rolling walker (2 wheeled) Transfers: Sit to/from Stand Sit to Stand: Mod assist;+2 physical assistance         General transfer comment: +2  to power up into standing and +1 assist to gain balance in standin as pt has R lateral bias; multimodal cues for midline posture and vc for positioning prior to standing; pt stood X 2 trails from recliner   Ambulation/Gait Ambulation/Gait assistance: Mod assist;Min assist;+2 safety/equipment Gait Distance (Feet): 4 Feet Assistive device: Rolling walker (2 wheeled) Gait Pattern/deviations: Trunk flexed;Shuffle;Decreased step length - right;Decreased step length - left;Step-to pattern     General Gait Details: assist for balance and managing RW; pt with HR up to 150s, RR in uppers 30s, and dyspneic; pt ambulated ~4 ft from EOB and required seated break; pt stood again briefly with increased WOB and requested to sit down; cues for pursed lip breathing    Stairs             Wheelchair Mobility    Modified Rankin (Stroke Patients Only)       Balance Overall balance assessment: Needs assistance Sitting-balance support: Bilateral upper extremity supported;Feet supported Sitting balance-Leahy Scale: Poor Sitting balance - Comments: intermittent posterior bias   Standing balance support: Bilateral upper extremity supported Standing balance-Leahy Scale: Poor; R lateral lean                              Cognition Arousal/Alertness: Awake/alert Behavior During Therapy: Flat affect Overall Cognitive Status: Impaired/Different from baseline Area of Impairment: Safety/judgement;Problem solving;Attention;Following commands;Memory                   Current Attention Level: Sustained Memory: Decreased short-term memory Following Commands: Follows one step commands  with increased time Safety/Judgement: Decreased awareness of deficits;Decreased awareness of safety   Problem Solving: Slow processing;Difficulty sequencing;Requires verbal cues;Requires tactile cues;Decreased initiation        Exercises      General Comments General comments (skin integrity, edema,  etc.): HR 110s-150s, Afib      Pertinent Vitals/Pain Pain Assessment: No/denies pain    Home Living                      Prior Function            PT Goals (current goals can now be found in the care plan section) Acute Rehab PT Goals Patient Stated Goal: return to independence Progress towards PT goals: Progressing toward goals    Frequency    Min 2X/week      PT Plan Current plan remains appropriate    Co-evaluation              AM-PAC PT "6 Clicks" Mobility   Outcome Measure  Help needed turning from your back to your side while in a flat bed without using bedrails?: A Little Help needed moving from lying on your back to sitting on the side of a flat bed without using bedrails?: A Lot Help needed moving to and from a bed to a chair (including a wheelchair)?: A Lot Help needed standing up from a chair using your arms (e.g., wheelchair or bedside chair)?: A Lot Help needed to walk in hospital room?: A Little Help needed climbing 3-5 steps with a railing? : A Lot 6 Click Score: 14    End of Session Equipment Utilized During Treatment: Gait belt Activity Tolerance: Patient limited by fatigue;Other (comment)(increased HR/RR/WOB) Patient left: in chair;with chair alarm set;with call bell/phone within reach;with family/visitor present Nurse Communication: Mobility status PT Visit Diagnosis: Other abnormalities of gait and mobility (R26.89);Difficulty in walking, not elsewhere classified (R26.2)     Time: [id number] PT Time Calculation (min) (ACUTE ONLY): 41 min  Charges:  $Gait Training: 23-37 mins $Therapeutic Activity: 8-22 mins                     Earney Navy, PTA Acute Rehabilitation Services Pager: [phone] Office: [phone]     Darliss Cheney 09/29/2019, 12:10 PM

## 2019-09-29 NOTE — Progress Notes (Addendum)
Progress Note  Patient Name: Cameron [name] Date of Encounter: 09/29/2019  Primary Cardiologist: New    Subjective   Patient denies CP   Breathing is fair  Little congestion  He says he never had CP    Inpatient Medications    Scheduled Meds: . apixaban  5 mg Oral BID  . aspirin  81 mg Oral Daily  . atorvastatin  80 mg Oral q1800  . Chlorhexidine Gluconate Cloth  6 each Topical Daily  . clopidogrel  75 mg Oral Q breakfast  . erythromycin  1 application Left Eye QID  . feeding supplement (ENSURE ENLIVE)  237 mL Oral TID BM  . levothyroxine  75 mcg Per Tube Q0600  . pantoprazole  40 mg Oral Daily  . rosuvastatin  20 mg Oral Daily  . sodium chloride flush  3 mL Intravenous Q12H  . tamsulosin  0.4 mg Oral Q supper  . vitamin B-12  500 mcg Oral Once per day on Mon Thu   Continuous Infusions: . sodium chloride    . cefTRIAXone (ROCEPHIN)  IV 1 g (09/29/19 0511)   PRN Meds: sodium chloride, acetaminophen, albuterol, ondansetron (ZOFRAN) IV, polyethylene glycol, sodium chloride flush   Vital Signs    Vitals:   09/29/19 0014 09/29/19 0429 09/29/19 0558 09/29/19 0827  BP: (!) 118/58 119/69  111/67  Pulse: 94 98  88  Resp: (!) 23 (!) 27 (!) 21 (!) 30  Temp: 98.3 F (36.8 C) 99 F (37.2 C)  98.4 F (36.9 C)  TempSrc: Oral Axillary  Oral  SpO2: 97% 98%  96%  Weight:      Height:        Intake/Output Summary (Last 24 hours) at 09/29/2019 1109 Last data filed at 09/29/2019 [id number] Gross per 24 hour  Intake 468.63 ml  Output 650 ml  Net -181.37 ml   Last 3 Weights 10/08/2019 09/27/2019 09/27/2019  Weight (lbs) 144 lb 6.4 oz 143 lb 15.4 oz 141 lb 12.1 oz  Weight (kg) 65.5 kg 65.3 kg 64.3 kg      Telemetry    Afib   Personally Reviewed  ECG      Physical Exam   GEN: Frail 86 yo in no acute distress.   Neck: JVP is normal   Cardiac: RRR, no murmurs, rubs, or gallops.  Respiratory: Clear to auscultation GI: Soft, nontender, non-distended  MS: No edema; No deformity    R arm in arm band. Neuro:  Nonfocal    Labs    High Sensitivity Troponin:   Recent Labs  Lab 09/24/19 2103 09/24/19 2346  TROPONINIHS 10,382* 9,437*      Chemistry Recent Labs  Lab 09/24/2019 2252 08/28/2019 2252 09/24/19 2103 09/24/19 2138 09/25/19 0435 09/26/19 0940 09/27/19 0653 10/01/2019 0237 09/29/19 0349  NA 131*   < > 133*   < > 135   < > 136 137 139  K 4.0   < > 3.7   < > 4.6   < > 3.8 4.3 3.5  CL 98   < > 103   < > 102   < > 103 105 106  CO2 20*   < > 18*   < > 21*   < > 21* 22 21*  GLUCOSE 141*   < > 130*   < > 124*   < > 88 111* 113*  BUN 18   < > 19   < > 20   < > 24* 23 16  CREATININE  1.21   < > 1.26*   < > 1.11   < > 1.20 0.94 1.06  CALCIUM 8.8*   < > 8.2*   < > 8.6*   < > 8.3* 8.2* 8.3*  PROT 6.6  --  5.8*  --  6.5  --   --   --   --   ALBUMIN 2.7*  --  2.3*  --  2.6*  --   --   --   --   AST 175*  --  123*  --  93*  --   --   --   --   ALT 37  --  74*  --  68*  --   --   --   --   ALKPHOS 46  --  47  --  49  --   --   --   --   BILITOT 0.9  --  1.2  --  0.9  --   --   --   --   GFRNONAA 52*   < > 50*   < > 58*   < > 53* >60 >60  GFRAA >60   < > 57*   < > >60   < > >60 >60 >60  ANIONGAP 13   < > 12   < > 12   < > 12 10 12    < > = values in this interval not displayed.     Hematology Recent Labs  Lab 09/27/19 0653 10/05/2019 0237 09/29/19 0349  WBC 9.6 9.5 8.7  RBC 3.15* 3.34* 3.59*  HGB 10.7* 11.5* 12.1*  HCT 31.8* 33.1* 36.3*  MCV 101.0* 99.1 101.1*  MCH 34.0 34.4* 33.7  MCHC 33.6 34.7 33.3  RDW 13.4 13.1 13.2  PLT 192 195 243    BNPNo results for input(s): BNP, PROBNP in the last 168 hours.   DDimer No results for input(s): DDIMER in the last 168 hours.   Radiology    CARDIAC CATHETERIZATION  Result Date: 10/25/2019  Non-ST elevation myocardial infarction complicated by polymorphic ventricular tachycardia/cardiac arrest.  Successful resuscitation.  99% proximal dominant circumflex with reduced TIMI flow (grade II -III) was treated with  PCI and stent reducing the stenosis to 0% with TIMI grade III flow (18 x 2.5 mm Onyx postdilated to 3.0 mm in diameter.  Separate left coronary ostia.  Proximal LAD contains eccentric 50% stenosis.  Circumflex is as noted above.  Right coronary is nondominant.  There is ostial 50% narrowing.  Inferobasal akinesis extending to the mid inferior wall.  EF 40 to 45%.  LVEDP 10 mmHg. Recommendation:  Aspirin, reduced dose Xarelto, and Plavix for 2 weeks then drop aspirin.  Combination of Xarelto and Plavix should be for 12 months.  Further management per team.  2 hours of Aggrastat to allow antiplatelet effect until absorption of Plavix.   Cardiac Studies    Patient Profile     86 y.o. male with hx of COPD, pulm fibrosis, diastolic CHF, perm atrial fibriillation, NICM in 2014 (with recovery of LVEF ) who presented feeling poorly, developed TdP.    Assessment & Plan     1   CAD   Troponin with signif bump  10,000  At 21:00 last night   This is 30 min after cardiac arrest   This goes along that ischemia/injury was happending long before the arrhythmic event    Predisposed to event     Cath  showed 99% lesion in prox/mid LCx which  was large and dominant   LAD with 50 to 60% stenosis    Pt is s/p intervention to this     Plan for DAPT   2  CHF  Pt with mod LV dysfunction on echo with wall motion abnormalities in region of LCx   May / hope it will improve    Volume is OK    Titrate Rx   Toprol XL 25 and cozaar 12.5    Watch BP as he is 89      2  Atrial fibrillation  Pt has a hx of chronic atrial fibrillation  He went into SR after cardioversion and is back in afib   Will need better rate control   Will need anticoagulation Switch to Eliquis  WIll prob need to drop cozaar and increase b blocker to bid dosing for now     3  Torsades  No recurrence   Avoid drugs that prolong QT  5 ID Empiric ABX since admit   WBC now 9.6  Pt afebrile    Will complete 7 days  6  Pulmonary   Pt with  baseline lung dz  Extubated and is oxygenating OK  7  HL   Lipds in AM  Started Crestor 20    8  Rehab   Willl ask cardiac rehab to see  Ambulate today  Signed, Dorris Carnes, MD  09/27/19

## 2019-09-29 NOTE — Plan of Care (Signed)
  Problem: Safety: Goal: Ability to remain free from injury will improve Outcome: Progressing   Problem: Skin Integrity: Goal: Risk for impaired skin integrity will decrease Outcome: Progressing   Problem: Cardiovascular: Goal: Ability to achieve and maintain adequate cardiovascular perfusion will improve Outcome: Progressing

## 2019-09-29 NOTE — Progress Notes (Signed)
PROGRESS NOTE  Cameron [name] [id number] DOB: [birth year] DOA: 09/27/2019 PCP: Lavone Orn, MD  HPI/Recap of past 24 hours: HPI from PCCM 86 year old man with history of HFpEF, COPD, atrial fibrillation on Xarelto, CAD, pulmonary fibrosis, GERD, hypothyroidism, recent hospitalization from 3/10-3/18 for facial cellulitis and hyponatremia presenting again with acute encephalopathy.  He has been having worse lethargy over the past few days.  There have been some reported falls at the facility.  Upon arrival to [facility] ED, he was found to have a UTI and started on antibiotics.  On evening of 3/28 he was found to be in pulseless Vtach.  CPR was initiated.  He was defibrillated x1.  ROSC was achieved after 4 minutes.  He had agonal breathing and was intubated extubated on 09/26/2019. Was previously on Lexapro, however given increase in Qtc >500 and Vtach, Lexapro was discontinued. Repeat EKG revealed Qtc of ~480s. Home Donepezil restarted on 3/29 and am QTc was 498. Held home Donepezil on 3/30 and 3/31 am QTc was 440.  Patient was subsequently stabilized.  [facility] hospitalist assumed care on 09/27/2019.      Noted overnight confusion likely due to sundowning/dementia, poor sleep last night.  This a.m. patient noted to be sleeping, but easily arousable, oriented to self only.  Patient denies any chest pain, shortness of breath at rest, abdominal pain, nausea/vomiting, fever/chills.    Assessment/Plan: Principal Problem:   Cardiac arrest [facility]) Active Problems:   Persistent atrial fibrillation (HCC)   Chronic diastolic HF (heart failure) (HCC)   Dementia (HCC)   Hyponatremia   Acute metabolic encephalopathy   Leukocytosis   Hypothyroidism   UTI (urinary tract infection)  Cardiac arrest/torsades Noted to have V. tach with defibrillation, prolonged QTC Intubated on 3/28, extubated on 3/30 Cardiology on board, appreciate recs Discontinue recently started Lexapro as well as home  Aricept Continue to monitor electrolytes closely  NSTEMI complicated by polymorphic ventricular tachycardia/history of CAD/ischemic cardiomyopathy Echo done on 09/25/2019 showed EF of 35 to 40%, with regional wall motion abnormalities Cardiology on board, status post LHC on 10/25/2019 which showed 99% proximal dominant circumflex status post PCI stent Continue aspirin, Eliquis, Plavix for ??4 weeks then stop aspirin.  Combination of Eliquis and Plavix should be for 12 months Start Crestor, losartan, metoprolol Follow-up with cardiology  Acute hypoxic respiratory failure Intubated on 3/28, extubated on 3/30 Continue pulmonary hygiene Supplemental oxygen prn Duo nebs as needed  UTI Currently afebrile, with no leukocytosis Blood culture x2 no growth to date UC grew E. Coli, sensitive to ceftriaxone Continue ceftriaxone, plan to DC on 4/2  Paroxysmal A. Fib Switch to metoprolol and Eliquis Plan to DC home diltiazem and Xarelto  Normocytic chronic anemia Hemoglobin baseline around 12 No obvious signs of bleeding Daily CBC  History of COPD Continue albuterol as needed, supplemental oxygen  Hypothyroidism Continue home Synthroid  BPH Continue home Flomax  Dementia Plan to DC Aricept due to noted prolonged QTC, V. tach and cardiac arrest            Malnutrition Type:  Nutrition Problem: Severe Malnutrition Etiology: chronic illness(COPD)   Malnutrition Characteristics:  Signs/Symptoms: severe fat depletion, severe muscle depletion, energy intake < or equal to 75% for > or equal to 1 month   Nutrition Interventions:  Interventions: Tube feeding    Estimated body mass index is 19.58 kg/m as calculated from the following:   Height as of this encounter: 6' (1.829 m).   Weight as of this encounter: 65.5 kg.  Code Status: DNR  Family Communication: Discussed with daughter at bedside  Disposition Plan: Patient came from SNF, plan to DC to SNF as per  recommendations from PT/OT.  TOC notified.  Cardiology clearance pending   Consultants:  Cardiology  PCCM  Procedures:  Mechanical ventilation  Antimicrobials:  Ceftriaxone  DVT prophylaxis: Eliquis   Objective: Vitals:   09/29/19 0429 09/29/19 0558 09/29/19 0827 09/29/19 1200  BP: 119/69  111/67 (!) 101/54  Pulse: 98  88 84  Resp: (!) 27 (!) 21 (!) 30 (!) 28  Temp: 99 F (37.2 C)  98.4 F (36.9 C) 98.6 F (37 C)  TempSrc: Axillary  Oral Oral  SpO2: 98%  96% 99%  Weight:      Height:        Intake/Output Summary (Last 24 hours) at 09/29/2019 1304 Last data filed at 09/29/2019 [id number] Gross per 24 hour  Intake 420.1 ml  Output 650 ml  Net -229.9 ml   Filed Weights   09/27/19 0300 09/27/19 1912 10/14/2019 1955  Weight: 64.3 kg 65.3 kg 65.5 kg    Exam:  General: NAD, chronically ill-appearing, oriented to self only  Cardiovascular: S1, S2 present  Respiratory:  Mild bibasilar crackles noted  Abdomen: Soft, nontender, nondistended, bowel sounds present  Musculoskeletal: No bilateral pedal edema noted  Skin: Normal  Psychiatry: Normal mood   Data Reviewed: CBC: Recent Labs  Lab 09/13/2019 2252 09/24/19 2103 09/25/19 0435 09/26/19 0940 09/27/19 0653 10/10/2019 0237 09/29/19 0349  WBC 17.0*   < > 13.6* 10.5 9.6 9.5 8.7  NEUTROABS 13.9*  --  11.1*  --   --  7.3 6.8  HGB 13.0   < > 13.3 10.8* 10.7* 11.5* 12.1*  HCT 37.3*   < > 39.7 31.8* 31.8* 33.1* 36.3*  MCV 98.2   < > 101.0* 98.8 101.0* 99.1 101.1*  PLT 222   < > 217 193 192 195 243   < > = values in this interval not displayed.   Basic Metabolic Panel: Recent Labs  Lab 09/24/19 2103 09/24/19 2138 09/25/19 0435 09/26/19 0940 09/27/19 0653 09/30/2019 0237 09/29/19 0349  NA 133*   < > 135 134* 136 137 139  K 3.7   < > 4.6 3.8 3.8 4.3 3.5  CL 103   < > 102 103 103 105 106  CO2 18*   < > 21* 19* 21* 22 21*  GLUCOSE 130*   < > 124* 96 88 111* 113*  BUN 19   < > 20 25* 24* 23 16  CREATININE  1.26*   < > 1.11 1.15 1.20 0.94 1.06  CALCIUM 8.2*   < > 8.6* 8.1* 8.3* 8.2* 8.3*  MG 1.9  --  2.4  --  2.3  --   --   PHOS  --   --  3.7  --   --   --   --    < > = values in this interval not displayed.   GFR: Estimated Creatinine Clearance: 42.1 mL/min (by C-G formula based on SCr of 1.06 mg/dL). Liver Function Tests: Recent Labs  Lab 09/22/2019 2252 09/24/19 2103 09/25/19 0435  AST 175* 123* 93*  ALT 37 74* 68*  ALKPHOS 46 47 49  BILITOT 0.9 1.2 0.9  PROT 6.6 5.8* 6.5  ALBUMIN 2.7* 2.3* 2.6*   No results for input(s): LIPASE, AMYLASE in the last 168 hours. No results for input(s): AMMONIA in the last 168 hours. Coagulation Profile: Recent Labs  Lab 09/20/2019  2252  INR 3.1*   Cardiac Enzymes: No results for input(s): CKTOTAL, CKMB, CKMBINDEX, TROPONINI in the last 168 hours. BNP (last 3 results) No results for input(s): PROBNP in the last 8760 hours. HbA1C: No results for input(s): HGBA1C in the last 72 hours. CBG: Recent Labs  Lab 09/24/19 2041 09/24/19 2136  GLUCAP 132* 124*   Lipid Profile: Recent Labs    09/29/19 0349  CHOL 110  HDL 21*  LDLCALC 76  TRIG 65  CHOLHDL 5.2   Thyroid Function Tests: No results for input(s): TSH, T4TOTAL, FREET4, T3FREE, THYROIDAB in the last 72 hours. Anemia Panel: No results for input(s): VITAMINB12, FOLATE, FERRITIN, TIBC, IRON, RETICCTPCT in the last 72 hours. Urine analysis:    Component Value Date/Time   COLORURINE YELLOW 09/24/2019 0212   APPEARANCEUR TURBID (A) 09/24/2019 0212   LABSPEC 1.023 09/24/2019 0212   PHURINE 6.0 09/24/2019 0212   GLUCOSEU NEGATIVE 09/24/2019 0212   HGBUR LARGE (A) 09/24/2019 0212   BILIRUBINUR NEGATIVE 09/24/2019 0212   KETONESUR NEGATIVE 09/24/2019 0212   PROTEINUR 100 (A) 09/24/2019 0212   UROBILINOGEN 0.2 07/12/2013 2230   NITRITE POSITIVE (A) 09/24/2019 0212   LEUKOCYTESUR MODERATE (A) 09/24/2019 0212   Sepsis Labs: @LABRCNTIP (procalcitonin:4,lacticidven:4)  ) Recent  Results (from the past 240 hour(s))  Urine culture     Status: Abnormal   Collection Time: 09/24/19  2:11 AM   Specimen: Urine, Catheterized  Result Value Ref Range Status   Specimen Description URINE, CATHETERIZED  Final   Special Requests   Final    NONE Performed at [facility], [address], [place], [place] [address]    Culture >=100,000 COLONIES/mL ESCHERICHIA COLI (A)  Final   Report Status 09/26/2019 FINAL  Final   Organism ID, Bacteria ESCHERICHIA COLI (A)  Final      Susceptibility   Escherichia coli - MIC*    AMPICILLIN >=32 RESISTANT Resistant     CEFAZOLIN >=64 RESISTANT Resistant     CEFTRIAXONE 0.5 SENSITIVE Sensitive     CIPROFLOXACIN >=4 RESISTANT Resistant     GENTAMICIN <=1 SENSITIVE Sensitive     IMIPENEM <=0.25 SENSITIVE Sensitive     NITROFURANTOIN <=16 SENSITIVE Sensitive     TRIMETH/SULFA <=20 SENSITIVE Sensitive     AMPICILLIN/SULBACTAM >=32 RESISTANT Resistant     PIP/TAZO >=128 RESISTANT Resistant     * >=100,000 COLONIES/mL ESCHERICHIA COLI  Blood culture (routine x 2)     Status: None (Preliminary result)   Collection Time: 09/24/19  3:50 AM   Specimen: BLOOD  Result Value Ref Range Status   Specimen Description BLOOD RIGHT ARM  Final   Special Requests   Final    BOTTLES DRAWN AEROBIC AND ANAEROBIC Blood Culture adequate volume   Culture   Final    NO GROWTH 4 DAYS Performed at [facility], [address], [place], [place] [address]    Report Status PENDING  Incomplete  Blood culture (routine x 2)     Status: None (Preliminary result)   Collection Time: 09/24/19  3:50 AM   Specimen: BLOOD  Result Value Ref Range Status   Specimen Description BLOOD LEFT ARM  Final   Special Requests   Final    BOTTLES DRAWN AEROBIC AND ANAEROBIC Blood Culture adequate volume   Culture   Final    NO GROWTH 4 DAYS Performed at [facility], [place] [address] [place], [place] [address]    Report Status PENDING  Incomplete  SARS  CORONAVIRUS 2 (TAT 6-24 HRS) Nasopharyngeal Nasopharyngeal Swab     Status: None   Collection Time: 09/24/19  4:27 AM   Specimen: Nasopharyngeal Swab  Result Value Ref Range Status   SARS Coronavirus 2 NEGATIVE NEGATIVE Final    Comment: (NOTE) SARS-CoV-2 target nucleic acids are NOT DETECTED. The SARS-CoV-2 RNA is generally detectable in upper and lower respiratory specimens during the acute phase of infection. Negative results do not preclude SARS-CoV-2 infection, do not rule out co-infections with other pathogens, and should not be used as the sole basis for treatment or other patient management decisions. Negative results must be combined with clinical observations, patient history, and epidemiological information. The expected result is Negative. Fact Sheet for Patients: SugarRoll.be Fact Sheet for Healthcare Providers: https://www.woods-mathews.com/ This test is not yet approved or cleared by the [place] FDA and  has been authorized for detection and/or diagnosis of SARS-CoV-2 by FDA under an Emergency Use Authorization (EUA). This EUA will remain  in effect (meaning this test can be used) for the duration of the COVID-19 declaration under Section 56 4(b)(1) of the Act, 21 U.S.C. section 360bbb-3(b)(1), unless the authorization is terminated or revoked sooner. Performed at [facility], [place] [address], [place], [place] [address]   MRSA PCR Screening     Status: None   Collection Time: 09/24/19  9:30 PM   Specimen: Nasopharyngeal  Result Value Ref Range Status   MRSA by PCR NEGATIVE NEGATIVE Final    Comment:        The GeneXpert MRSA Assay (FDA approved for NASAL specimens only), is one component of a comprehensive MRSA colonization surveillance program. It is not intended to diagnose MRSA infection nor to guide or monitor treatment for MRSA infections. Performed at [facility], [place] [address],  [place], [place] [address]       Studies: No results found.  Scheduled Meds: . apixaban  5 mg Oral BID  . aspirin  81 mg Oral Daily  . Chlorhexidine Gluconate Cloth  6 each Topical Daily  . clopidogrel  75 mg Oral Q breakfast  . erythromycin  1 application Left Eye QID  . feeding supplement (ENSURE ENLIVE)  237 mL Oral TID BM  . levothyroxine  75 mcg Per Tube Q0600  . losartan  12.5 mg Oral Daily  . metoprolol succinate  25 mg Oral BID WC  . pantoprazole  40 mg Oral Daily  . rosuvastatin  20 mg Oral Daily  . sodium chloride flush  3 mL Intravenous Q12H  . tamsulosin  0.4 mg Oral Q supper  . vitamin B-12  500 mcg Oral Once per day on Mon Thu    Continuous Infusions: . sodium chloride    . cefTRIAXone (ROCEPHIN)  IV 1 g (09/29/19 0511)     LOS: 5 days     Alma Friendly, MD [facility]  If 7PM-7AM, please contact night-coverage [email or web address] 09/29/2019, 1:04 PM

## 2019-09-29 NOTE — Progress Notes (Signed)
CARDIAC REHAB PHASE I   Pt recently worked with PT. Pt noted to become SOB, and fatigued easily with minimal acivity. HR noted to increase. Will follow PTs progress. CRP II order placed to fulfill the requirement.   Rufina Falco, RN BSN 09/29/2019 1:42 PM

## 2019-09-30 ENCOUNTER — Inpatient Hospital Stay (HOSPITAL_COMMUNITY): Payer: PPO

## 2019-09-30 ENCOUNTER — Other Ambulatory Visit (HOSPITAL_COMMUNITY): Payer: PPO

## 2019-09-30 DIAGNOSIS — E039 Hypothyroidism, unspecified: Secondary | ICD-10-CM | POA: Diagnosis not present

## 2019-09-30 DIAGNOSIS — G9341 Metabolic encephalopathy: Secondary | ICD-10-CM | POA: Diagnosis not present

## 2019-09-30 DIAGNOSIS — F0390 Unspecified dementia without behavioral disturbance: Secondary | ICD-10-CM | POA: Diagnosis not present

## 2019-09-30 DIAGNOSIS — I5032 Chronic diastolic (congestive) heart failure: Secondary | ICD-10-CM | POA: Diagnosis not present

## 2019-09-30 DIAGNOSIS — I469 Cardiac arrest, cause unspecified: Secondary | ICD-10-CM | POA: Diagnosis not present

## 2019-09-30 LAB — BASIC METABOLIC PANEL
Anion gap: 13 (ref 5–15)
Anion gap: 13 (ref 5–15)
BUN: 18 mg/dL (ref 8–23)
BUN: 17 mg/dL (ref 8–23)
CO2: 20 mmol/L — ABNORMAL LOW (ref 22–32)
CO2: 19 mmol/L — ABNORMAL LOW (ref 22–32)
Calcium: 8.8 mg/dL — ABNORMAL LOW (ref 8.9–10.3)
Calcium: 8.5 mg/dL — ABNORMAL LOW (ref 8.9–10.3)
Chloride: 110 mmol/L (ref 98–111)
Chloride: 108 mmol/L (ref 98–111)
Creatinine, Ser: 0.94 mg/dL (ref 0.61–1.24)
Creatinine, Ser: 0.88 mg/dL (ref 0.61–1.24)
GFR calc Af Amer: >60 mL/min (ref >60)
GFR calc Af Amer: >60 mL/min (ref >60)
GFR calc non Af Amer: >60 mL/min (ref >60)
GFR calc non Af Amer: >60 mL/min (ref >60)
Glucose, Bld: 132 mg/dL — ABNORMAL HIGH (ref 70–99)
Glucose, Bld: 128 mg/dL — ABNORMAL HIGH (ref 70–99)
Potassium: 6 mmol/L — ABNORMAL HIGH (ref 3.5–5.1)
Potassium: 4.9 mmol/L (ref 3.5–5.1)
Sodium: 143 mmol/L (ref 135–145)
Sodium: 140 mmol/L (ref 135–145)

## 2019-09-30 LAB — URINALYSIS, ROUTINE W REFLEX MICROSCOPIC
Bacteria, UA: NONE SEEN
Bilirubin Urine: NEGATIVE
Glucose, UA: NEGATIVE mg/dL
Ketones, ur: NEGATIVE mg/dL
Leukocytes,Ua: NEGATIVE
Nitrite: NEGATIVE
Protein, ur: 100 mg/dL — AB
Specific Gravity, Urine: 1.029 (ref 1.005–1.030)
pH: 5 (ref 5.0–8.0)

## 2019-09-30 LAB — CBC WITH DIFFERENTIAL/PLATELET
Abs Immature Granulocytes: 0.09 10*3/uL — ABNORMAL HIGH (ref 0.00–0.07)
Basophils Absolute: 0 10*3/uL (ref 0.0–0.1)
Basophils Relative: 0 %
Eosinophils Absolute: 0 10*3/uL (ref 0.0–0.5)
Eosinophils Relative: 0 %
HCT: 41.2 % (ref 39.0–52.0)
Hemoglobin: 13.3 g/dL (ref 13.0–17.0)
Immature Granulocytes: 1 %
Lymphocytes Relative: 12 %
Lymphs Abs: 1.3 10*3/uL (ref 0.7–4.0)
MCH: 33.5 pg (ref 26.0–34.0)
MCHC: 32.3 g/dL (ref 30.0–36.0)
MCV: 103.8 fL — ABNORMAL HIGH (ref 80.0–100.0)
Monocytes Absolute: 1 10*3/uL (ref 0.1–1.0)
Monocytes Relative: 9 %
Neutro Abs: 8.6 10*3/uL — ABNORMAL HIGH (ref 1.7–7.7)
Neutrophils Relative %: 78 %
Platelets: 247 10*3/uL (ref 150–400)
RBC: 3.97 MIL/uL — ABNORMAL LOW (ref 4.22–5.81)
RDW: 13.7 % (ref 11.5–15.5)
WBC: 11 10*3/uL — ABNORMAL HIGH (ref 4.0–10.5)
nRBC: 0 % (ref 0.0–0.2)

## 2019-09-30 MED ORDER — SODIUM ZIRCONIUM CYCLOSILICATE 10 G PO PACK
10.0000 g | PACK | Freq: Once | ORAL | Status: DC
  Filled 2019-09-30: qty 1

## 2019-09-30 MED ORDER — METOPROLOL TARTRATE 5 MG/5ML IV SOLN
2.5000 mg | INTRAVENOUS | Status: DC | PRN
  Administered 2019-10-01: 09:00:00 2.5 mg via INTRAVENOUS
  Filled 2019-09-30: qty 5

## 2019-09-30 MED ORDER — METOPROLOL TARTRATE 25 MG PO TABS: 37.5000 mg | ORAL_TABLET | Freq: Two times a day (BID) | ORAL | Status: DC

## 2019-09-30 MED ORDER — LEVALBUTEROL HCL 0.63 MG/3ML IN NEBU
0.6300 mg | INHALATION_SOLUTION | Freq: Three times a day (TID) | RESPIRATORY_TRACT | Status: DC
  Administered 2019-09-30 – 2019-10-02 (×6): 0.63 mg via RESPIRATORY_TRACT
  Filled 2019-09-30 (×6): qty 3

## 2019-09-30 NOTE — Progress Notes (Signed)
Received call from Dr. Fransico Him (daugher-in-law to patient). concerning the results of the MRI. Dr. Radford Pax requested that this RN notify [facility] on call and have them have neuro come check on her father in law concerning the results of the MRI.   This RN notified [facility] on call and relayed the message.

## 2019-09-30 NOTE — Progress Notes (Signed)
Progress Note  Patient Name: Cameron [name] Date of Encounter: 09/30/2019  Primary Cardiologist: Dr Harrington Challenger  Subjective   No CP or dyspnea; answers questions appropriately  Inpatient Medications    Scheduled Meds: . apixaban  5 mg Oral BID  . aspirin  81 mg Oral Daily  . Chlorhexidine Gluconate Cloth  6 each Topical Daily  . clopidogrel  75 mg Oral Q breakfast  . erythromycin  1 application Left Eye QID  . feeding supplement (ENSURE ENLIVE)  237 mL Oral TID BM  . levothyroxine  75 mcg Per Tube Q0600  . metoprolol tartrate  25 mg Oral BID  . pantoprazole  40 mg Oral Daily  . rosuvastatin  20 mg Oral Daily  . sodium chloride flush  3 mL Intravenous Q12H  . sodium zirconium cyclosilicate  10 g Oral Once  . tamsulosin  0.4 mg Oral Q supper  . vitamin B-12  500 mcg Oral Once per day on Mon Thu   Continuous Infusions: . sodium chloride     PRN Meds: sodium chloride, acetaminophen, albuterol, ondansetron (ZOFRAN) IV, polyethylene glycol, sodium chloride flush   Vital Signs    Vitals:   09/29/19 2230 09/29/19 2359 09/30/19 0500 09/30/19 0800  BP: 124/72 110/79 123/64 116/74  Pulse:  91 97 (!) 103  Resp: (!) 23 (!) 35 (!) 28 (!) 29  Temp: (!) 97.4 F (36.3 C) 98.7 F (37.1 C) 98.2 F (36.8 C) (!) 97 F (36.1 C)  TempSrc: Oral Axillary Axillary Axillary  SpO2:  92% 97% 95%  Weight:      Height:        Intake/Output Summary (Last 24 hours) at 09/30/2019 0938 Last data filed at 09/29/2019 2151 Gross per 24 hour  Intake 78 ml  Output 275 ml  Net -197 ml   Last 3 Weights 10/18/2019 09/27/2019 09/27/2019  Weight (lbs) 144 lb 6.4 oz 143 lb 15.4 oz 141 lb 12.1 oz  Weight (kg) 65.5 kg 65.3 kg 64.3 kg      Telemetry    Atrial fibrillation- Personally Reviewed  Physical Exam   GEN: No acute distress.  Frail Neck: No JVD Cardiac: irregular Respiratory: Basilar crackles GI: Soft, nontender, non-distended  MS: No edema Neuro:  Nonfocal  Psych: Normal affect   Labs    High Sensitivity Troponin:   Recent Labs  Lab 09/24/19 2103 09/24/19 2346  TROPONINIHS 10,382* 9,437*      Chemistry Recent Labs  Lab 09/04/2019 2252 08/30/2019 2252 09/24/19 2103 09/24/19 2138 09/25/19 0435 09/26/19 0940 09/29/19 0349 09/30/19 0342 09/30/19 0757  NA 131*   < > 133*   < > 135   < > 139 143 140  K 4.0   < > 3.7   < > 4.6   < > 3.5 6.0* 4.9  CL 98   < > 103   < > 102   < > 106 110 108  CO2 20*   < > 18*   < > 21*   < > 21* 20* 19*  GLUCOSE 141*   < > 130*   < > 124*   < > 113* 132* 128*  BUN 18   < > 19   < > 20   < > 16 18 17   CREATININE 1.21   < > 1.26*   < > 1.11   < > 1.06 0.94 0.88  CALCIUM 8.8*   < > 8.2*   < > 8.6*   < > 8.3*  8.8* 8.5*  PROT 6.6  --  5.8*  --  6.5  --   --   --   --   ALBUMIN 2.7*  --  2.3*  --  2.6*  --   --   --   --   AST 175*  --  123*  --  93*  --   --   --   --   ALT 37  --  74*  --  68*  --   --   --   --   ALKPHOS 46  --  47  --  49  --   --   --   --   BILITOT 0.9  --  1.2  --  0.9  --   --   --   --   GFRNONAA 52*   < > 50*   < > 58*   < > >60 >60 >60  GFRAA >60   < > 57*   < > >60   < > >60 >60 >60  ANIONGAP 13   < > 12   < > 12   < > 12 13 13    < > = values in this interval not displayed.     Hematology Recent Labs  Lab 10/23/2019 0237 09/29/19 0349 09/30/19 0342  WBC 9.5 8.7 11.0*  RBC 3.34* 3.59* 3.97*  HGB 11.5* 12.1* 13.3  HCT 33.1* 36.3* 41.2  MCV 99.1 101.1* 103.8*  MCH 34.4* 33.7 33.5  MCHC 34.7 33.3 32.3  RDW 13.1 13.2 13.7  PLT 195 243 247    Radiology    DG CHEST PORT 1 VIEW  Result Date: 09/29/2019 CLINICAL DATA:  Tachypnea EXAM: PORTABLE CHEST 1 VIEW COMPARISON:  09/24/2019 FINDINGS: Single frontal view of the chest demonstrates interval extubation and removal of enteric catheter. Cardiac silhouette is enlarged but stable. Background scarring and fibrosis is again noted. Bilateral ground-glass airspace disease has developed, greatest throughout the right lung and left lung base. There are small  bilateral pleural effusions. No pneumothorax. IMPRESSION: 1. Favor interval development of mild pulmonary edema on background scarring and fibrosis. Superimposed infection or aspiration could be considered in the appropriate clinical setting. Electronically Signed   By: Randa Ngo M.D.   On: 09/29/2019 20:22    Patient Profile     86 y.o. male with past medical history of permanent atrial fibrillation, chronic diastolic congestive heart failure, dementia, COPD admitted with encephalopathy.  Patient had an episode of ventricular tachycardia/torsades requiring defibrillation.  Troponins were elevated and patient subsequently had PCI of left circumflex.  Echocardiogram this admission showed ejection fraction 35 to 40% with inferior/inferolateral akinesis, mild right ventricular enlargement, severe right atrial enlargement, moderate mitral regurgitation, moderate tricuspid regurgitation, mild to moderate aortic insufficiency.  Chest CT shows pulmonary fibrosis.  Assessment & Plan    1 Non-ST elevation myocardial infarction-patient is status post PCI of left circumflex.  Continue aspirin, Plavix and statin.  After 2 weeks will discontinue aspirin given need for apixaban.  2 paroxysmal atrial fibrillation-patient is back in atrial fibrillation.  His heart rate is high normal.  Will increase metoprolol to 37.5 mg twice daily and follow heart rate.  Continue apixaban.  3 status post cardiac arrest-possibly related to non-ST elevation myocardial infarction.  QTC was also prolonged and Lexapro discontinued.  4 ischemic cardiomyopathy-we will continue beta-blocker.  We will not add an ARB as blood pressure is borderline and we are increasing metoprolol for atrial fibrillation.  4 COPD/pulmonary  fibrosis  5 hyperlipidemia-continue statin.  For questions or updates, please contact [place] Please consult [email or web address] for contact info under        Signed, Kirk Ruths, MD  09/30/2019, 9:38  AM

## 2019-09-30 NOTE — Consult Note (Signed)
Neurology Consultation Reason for Consult: Stroke Referring Physician: Silas Sacramento, C  CC: Altered mental status  History is obtained from: Chart review  HPI: Cameron [name] is a 86 y.o. male who was admitted on 3/28 with altered mental status and found to have a urinary tract infection.  He was also recently admitted for odontogenic facial cellulitis from 3/10 until 3/18.  He was noted to be hyponatremic during that hospitalization.  He was readmitted on 3/27 after confusion was noted at [facility].  In the emergency department, he had a urinalysis that was strongly suggestive of urinary tract infection.  He was started on ceftriaxone and admitted for IV antibiotics.  On 3/28, he had a V. tach arrest with 4-minute downtime.  He was purposeful immediately following return of spontaneous circulation.  Due to low ventilatory rates on SBT he remained ventilated until 3/30.  He had a heart catheterization on 4/1 with a stent placed.   This morning, he was noted to be more sleepy, not really following commands.  Due to this, an MRI was obtained which shows 4 punctate infarcts in the right MCA distribution.  Repeat UA looked much improved.  He has a history of atrial fibrillation and was on Xarelto chronically, changed to heparin for a brief time while inpatient.  Currently on Eliquis, aspirin, and Plavix.  He has a history of mild dementia, but is typically readily interactive and fairly appropriate.  He typically lives in assisted living, but was in [facility] short-term after his admission for cellulitis.  This represents a significant change for him.  He is able to answer questions for me, though I do feel his answers are reliable such as when asked him how long he has been having double vision, he tells me he is not sure.   LKW: Unclear tpa given?: no, unclear time of onset    ROS: A 14 point ROS was performed and is negative except as noted in the HPI.   Past Medical  History:  Diagnosis Date  . Arthritis   . CHF (congestive heart failure) ([place])   . COPD (chronic obstructive pulmonary disease) ([place])   . Dysrhythmia    atrial fibrilation  . GERD (gastroesophageal reflux disease)   . Pulmonary fibrosis (HCC)      Family History  Problem Relation Age of Onset  . Heart attack Mother   . Pulmonary embolism Father      Social History:  reports that he quit smoking about 58 years ago. His smoking use included cigarettes. He has a 80.00 pack-year smoking history. He has never used smokeless tobacco. He reports current alcohol use of about 2.0 standard drinks of alcohol per week. He reports that he does not use drugs.   Exam: Current vital signs: BP 123/74 (BP Location: Right Arm)   Pulse (!) 103   Temp 99.1 F (37.3 C) (Oral)   Resp (!) 23   Ht 6' (1.829 m)   Wt 65.5 kg   SpO2 90%   BMI 19.58 kg/m  Vital signs in last 24 hours: Temp:  [97 F (36.1 C)-99.1 F (37.3 C)] 99.1 F (37.3 C) (04/03 2330) Pulse Rate:  [41-103] 103 (04/03 2330) Resp:  [21-38] 23 (04/03 2330) BP: (106-123)/(58-79) 123/74 (04/03 2330) SpO2:  [90 %-99 %] 90 % (04/03 2330) FiO2 (%):  [28 %] 28 % (04/03 1448)   Physical Exam  Constitutional: Appears elderly Psych: Affect appropriate to situation Eyes: No scleral injection HENT: No OP obstrucion MSK: no  joint deformities.  Cardiovascular: Normal rate and regular rhythm.  Respiratory: Effort normal, non-labored breathing GI: Soft.  No distension. There is no tenderness.  Skin: WDI  Neuro: Mental Status: Patient is easily arousable, oriented to person, gives month is March, unable to give the year.  He follows commands, but sometimes requires repeated prompting to do so. Cranial Nerves: II: Blinks to threat bilaterally.  Pupils are equal, round, and reactive to light.   III,IV, VI: He has impaired adduction of the right eye.  Full EOM in the left V: Facial sensation is symmetric to temperature VII: Facial  movement is symmetric.  VIII: hearing is intact to voice X: Uvula elevates symmetrically XI: Shoulder shrug is symmetric. XII: tongue is midline without atrophy or fasciculations.  Motor: He has good strength in all 4 extremities, no drift. Sensory: Sensation is symmetric to light touch and temperature in the arms and legs. Cerebellar: He has mild intentional tremor on finger-nose-finger bilaterally.    I have reviewed labs in epic and the results pertinent to this consultation are: [id number] TSH-1.198 CRP from admission 16 Sodium 140 Creatinine 0.88 LDL 76  I have reviewed the images obtained: MRI brain-4 punctate infarcts in the right MCA distribution including 1 in the subcortical region.  Impression: 86 year old male with likely multifactorial delirium with factors including including hospitalization, urinary tract infection, cardiac arrest his infarcts are all in a single distribution and therefore I would recommend vascular imaging with CT angiogram.  I am not certain how much the strokes are contributing to his encephalopathy, and a younger person I would not expect the contributed much, but an 86 year old sometimes disproportionate effects are seen.  I would expect his multifactorial delirium to gradually improve, but likely will require some time.  Not certain of the etiology of his disconjugate gaze, possible that he does has an underlying exophoria which is more prominent due to his somnolence.  This would be further reason to assess CTA as well.  Though there is some risk of hemorrhagic transformation in any stroke, given the presence of multiple emboli and the punctate nature of all the strokes, I think the risk of further emboli is likely larger than the risk of hemorrhagic transformation of infarct is small and therefore I would continue Eliquis at the current time.  Recommendations: - Frequent neuro checks - CTA head neck - Prophylactic therapy-continue Eliquis - Risk  factor modification - Telemetry monitoring - PT consult, OT consult, Speech consult -Check ammonia - Stroke team to follow   Roland Rack, MD [facility] [phone]  If 7pm- 7am, please page neurology on call as listed in [place].

## 2019-09-30 NOTE — Progress Notes (Signed)
PROGRESS NOTE  Cameron [name] DXI:[id number] DOB: 05-Sep-1925 DOA: 09/10/2019 PCP: Lavone Orn, MD  HPI/Recap of past 24 hours: HPI from PCCM 86 year old man with history of HFpEF, COPD, atrial fibrillation on Xarelto, CAD, pulmonary fibrosis, GERD, hypothyroidism, recent hospitalization from 3/10-3/18 for facial cellulitis and hyponatremia presenting again with acute encephalopathy.  He has been having worse lethargy over the past few days.  There have been some reported falls at the facility.  Upon arrival to [facility] ED, he was found to have a UTI and started on antibiotics.  On evening of 3/28 he was found to be in pulseless Vtach.  CPR was initiated.  He was defibrillated x1.  ROSC was achieved after 4 minutes.  He had agonal breathing and was intubated extubated on 09/26/2019. Was previously on Lexapro, however given increase in Qtc >500 and Vtach, Lexapro was discontinued. Repeat EKG revealed Qtc of ~480s. Home Donepezil restarted on 3/29 and am QTc was 498. Held home Donepezil on 3/30 and 3/31 am QTc was 440.  Patient was subsequently stabilized.  [facility] hospital[facility] assumed care on 09/27/2019.     Today, met patient resting in bed, daughter at bedside.  Noted to be lethargic this morning/sleepy, not really following commands, struggling to keep her eyes open, more confused. Poor oral intake, history of underlying dementia.       Assessment/Plan: Principal Problem:   Cardiac arrest [facility]) Active Problems:   Persistent atrial fibrillation (HCC)   Chronic diastolic HF (heart failure) (HCC)   Dementia (HCC)   Hyponatremia   Acute metabolic encephalopathy   Leukocytosis   Hypothyroidism   UTI (urinary tract infection)  Cardiac arrest/torsades Noted to have V. tach with defibrillation, prolonged QTC Intubated on 3/28, extubated on 3/30 Cardiology on board, appreciate recs Discontinue recently started Lexapro as well as home Aricept Continue to monitor electrolytes  closely  NSTEMI complicated by polymorphic ventricular tachycardia/history of CAD/ischemic cardiomyopathy Echo done on 09/25/2019 showed EF of 35 to 40%, with regional wall motion abnormalities Cardiology on board, status post LHC on 10/26/2019 which showed 99% proximal dominant circumflex status post PCI stent Continue aspirin, Eliquis, Plavix for 2 weeks then stop aspirin.  Combination of Eliquis and Plavix should be for 12 months Continue Crestor, losartan, metoprolol Follow-up with cardiology  Acute metabolic encephalopathy/delirium History of underlying dementia, with current delirium No obvious signs of infection, although with intermittent tachypnea Remains afebrile, with some mild leukocytosis UA unremarkable (recently completed treatment for UTI) Chest x-ray showed possible interval development of mild pulmonary edema with background scarring/fibrosis.  Superimposed infection or aspiration could be considered CT head unremarkable, MRI pending to rule out any acute CVA SLP consulted for swallow evaluation, clear liquid diet pending evaluation Continue to monitor closely  Acute hypoxic respiratory failure Intubated on 3/28, extubated on 3/30 Continue pulmonary hygiene Supplemental oxygen prn Duo nebs as needed  UTI Currently afebrile, with no leukocytosis Blood culture x2 no growth to date UC grew E. Coli, sensitive to ceftriaxone Completed 7 days of ceftriaxone on 4/2  Paroxysmal A. Fib Switch to metoprolol and Eliquis Plan to discontinue home diltiazem and Xarelto  Normocytic chronic anemia Hemoglobin baseline around 12 No obvious signs of bleeding Daily CBC  History of COPD Continue albuterol as needed, supplemental oxygen  Hypothyroidism Continue home Synthroid  BPH Continue home Flomax  Dementia Plan to discontinue Aricept due to noted prolonged QTC, V. tach and cardiac arrest            Malnutrition Type:  Nutrition Problem: Severe  Malnutrition Etiology: chronic illness(COPD)   Malnutrition Characteristics:  Signs/Symptoms: severe fat depletion, severe muscle depletion, energy intake < or equal to 75% for > or equal to 1 month   Nutrition Interventions:  Interventions: Tube feeding    Estimated body mass index is 19.58 kg/m as calculated from the following:   Height as of this encounter: 6' (1.829 m).   Weight as of this encounter: 65.5 kg.     Code Status: DNR  Family Communication: Discussed with daughter at bedside on 09/30/2019  Disposition Plan: Patient came from SNF, plan to DC to SNF as per recommendations from PT/OT.  TOC notified.  Cardiology clearance pending.  Patient will still need about 24 to 48 hours for further work-up   Consultants:  Cardiology  PCCM  Procedures:  Mechanical ventilation  Antimicrobials:  Ceftriaxone  DVT prophylaxis: Eliquis   Objective: Vitals:   09/30/19 1200 09/30/19 1400 09/30/19 1448 09/30/19 1600  BP: (!) 113/58 110/66  118/74  Pulse: 93 94  (!) 102  Resp: (!) 38 (!) 23  (!) 21  Temp:      TempSrc:      SpO2: 95% 96% 97% 96%  Weight:      Height:        Intake/Output Summary (Last 24 hours) at 09/30/2019 1644 Last data filed at 09/29/2019 2151 Gross per 24 hour  Intake 78 ml  Output 275 ml  Net -197 ml   Filed Weights   09/27/19 0300 09/27/19 1912 10/16/2019 1955  Weight: 64.3 kg 65.3 kg 65.5 kg    Exam:  General: NAD, chronically ill-appearing, oriented to self only  Cardiovascular: S1, S2 present  Respiratory:  Mild bibasilar crackles noted  Abdomen: Soft, nontender, nondistended, bowel sounds present  Musculoskeletal: No bilateral pedal edema noted  Skin: Normal  Psychiatry:  Unable to assess   Data Reviewed: CBC: Recent Labs  Lab 09/13/2019 2252 09/24/19 2103 09/25/19 0435 09/25/19 0435 09/26/19 0940 09/27/19 0653 10/11/2019 0237 09/29/19 0349 09/30/19 0342  WBC 17.0*   < > 13.6*   < > 10.5 9.6 9.5 8.7 11.0*   NEUTROABS 13.9*  --  11.1*  --   --   --  7.3 6.8 8.6*  HGB 13.0   < > 13.3   < > 10.8* 10.7* 11.5* 12.1* 13.3  HCT 37.3*   < > 39.7   < > 31.8* 31.8* 33.1* 36.3* 41.2  MCV 98.2   < > 101.0*   < > 98.8 101.0* 99.1 101.1* 103.8*  PLT 222   < > 217   < > 193 192 195 243 247   < > = values in this interval not displayed.   Basic Metabolic Panel: Recent Labs  Lab 09/24/19 2103 09/24/19 2138 09/25/19 0435 09/26/19 0940 09/27/19 2800 10/25/2019 0237 09/29/19 0349 09/30/19 0342 09/30/19 0757  NA 133*   < > 135   < > 136 137 139 143 140  K 3.7   < > 4.6   < > 3.8 4.3 3.5 6.0* 4.9  CL 103   < > 102   < > 103 105 106 110 108  CO2 18*   < > 21*   < > 21* 22 21* 20* 19*  GLUCOSE 130*   < > 124*   < > 88 111* 113* 132* 128*  BUN 19   < > 20   < > 24* 23 16 18 17   CREATININE 1.26*   < > 1.11   < >  1.20 0.94 1.06 0.94 0.88  CALCIUM 8.2*   < > 8.6*   < > 8.3* 8.2* 8.3* 8.8* 8.5*  MG 1.9  --  2.4  --  2.3  --   --   --   --   PHOS  --   --  3.7  --   --   --   --   --   --    < > = values in this interval not displayed.   GFR: Estimated Creatinine Clearance: 50.7 mL/min (by C-G formula based on SCr of 0.88 mg/dL). Liver Function Tests: Recent Labs  Lab 09/24/2019 2252 09/24/19 2103 09/25/19 0435  AST 175* 123* 93*  ALT 37 74* 68*  ALKPHOS 46 47 49  BILITOT 0.9 1.2 0.9  PROT 6.6 5.8* 6.5  ALBUMIN 2.7* 2.3* 2.6*   No results for input(s): LIPASE, AMYLASE in the last 168 hours. No results for input(s): AMMONIA in the last 168 hours. Coagulation Profile: Recent Labs  Lab 09/07/2019 2252  INR 3.1*   Cardiac Enzymes: No results for input(s): CKTOTAL, CKMB, CKMBINDEX, TROPONINI in the last 168 hours. BNP (last 3 results) No results for input(s): PROBNP in the last 8760 hours. HbA1C: No results for input(s): HGBA1C in the last 72 hours. CBG: Recent Labs  Lab 09/24/19 2041 09/24/19 2136  GLUCAP 132* 124*   Lipid Profile: Recent Labs    09/29/19 0349  CHOL 110  HDL 21*   LDLCALC 76  TRIG 65  CHOLHDL 5.2   Thyroid Function Tests: No results for input(s): TSH, T4TOTAL, FREET4, T3FREE, THYROIDAB in the last 72 hours. Anemia Panel: No results for input(s): VITAMINB12, FOLATE, FERRITIN, TIBC, IRON, RETICCTPCT in the last 72 hours. Urine analysis:    Component Value Date/Time   COLORURINE YELLOW 09/30/2019 0449   APPEARANCEUR HAZY (A) 09/30/2019 0449   LABSPEC 1.029 09/30/2019 0449   PHURINE 5.0 09/30/2019 0449   GLUCOSEU NEGATIVE 09/30/2019 0449   HGBUR SMALL (A) 09/30/2019 0449   BILIRUBINUR NEGATIVE 09/30/2019 0449   KETONESUR NEGATIVE 09/30/2019 0449   PROTEINUR 100 (A) 09/30/2019 0449   UROBILINOGEN 0.2 07/12/2013 2230   NITRITE NEGATIVE 09/30/2019 0449   LEUKOCYTESUR NEGATIVE 09/30/2019 0449   Sepsis Labs: @LABRCNTIP (procalcitonin:4,lacticidven:4)  ) Recent Results (from the past 240 hour(s))  Urine culture     Status: Abnormal   Collection Time: 09/24/19  2:11 AM   Specimen: Urine, Catheterized  Result Value Ref Range Status   Specimen Description URINE, CATHETERIZED  Final   Special Requests   Final    NONE Performed at [facility], [address] [place], [place] [address]    Culture >=100,000 COLONIES/mL ESCHERICHIA COLI (A)  Final   Report Status 09/26/2019 FINAL  Final   Organism ID, Bacteria ESCHERICHIA COLI (A)  Final      Susceptibility   Escherichia coli - MIC*    AMPICILLIN >=32 RESISTANT Resistant     CEFAZOLIN >=64 RESISTANT Resistant     CEFTRIAXONE 0.5 SENSITIVE Sensitive     CIPROFLOXACIN >=4 RESISTANT Resistant     GENTAMICIN <=1 SENSITIVE Sensitive     IMIPENEM <=0.25 SENSITIVE Sensitive     NITROFURANTOIN <=16 SENSITIVE Sensitive     TRIMETH/SULFA <=20 SENSITIVE Sensitive     AMPICILLIN/SULBACTAM >=32 RESISTANT Resistant     PIP/TAZO >=128 RESISTANT Resistant     * >=100,000 COLONIES/mL ESCHERICHIA COLI  Blood culture (routine x 2)     Status: None   Collection Time: 09/24/19  3:50  AM   Specimen:  BLOOD  Result Value Ref Range Status   Specimen Description BLOOD RIGHT ARM  Final   Special Requests   Final    BOTTLES DRAWN AEROBIC AND ANAEROBIC Blood Culture adequate volume   Culture   Final    NO GROWTH 5 DAYS Performed at [facility], [address], [place], [place] [address]    Report Status 09/29/2019 FINAL  Final  Blood culture (routine x 2)     Status: None   Collection Time: 09/24/19  3:50 AM   Specimen: BLOOD  Result Value Ref Range Status   Specimen Description BLOOD LEFT ARM  Final   Special Requests   Final    BOTTLES DRAWN AEROBIC AND ANAEROBIC Blood Culture adequate volume   Culture   Final    NO GROWTH 5 DAYS Performed at [facility], [place] [address], [place], [place] [address]    Report Status 09/29/2019 FINAL  Final  SARS CORONAVIRUS 2 (TAT 6-24 HRS) Nasopharyngeal Nasopharyngeal Swab     Status: None   Collection Time: 09/24/19  4:27 AM   Specimen: Nasopharyngeal Swab  Result Value Ref Range Status   SARS Coronavirus 2 NEGATIVE NEGATIVE Final    Comment: (NOTE) SARS-CoV-2 target nucleic acids are NOT DETECTED. The SARS-CoV-2 RNA is generally detectable in upper and lower respiratory specimens during the acute phase of infection. Negative results do not preclude SARS-CoV-2 infection, do not rule out co-infections with other pathogens, and should not be used as the sole basis for treatment or other patient management decisions. Negative results must be combined with clinical observations, patient history, and epidemiological information. The expected result is Negative. Fact Sheet for Patients: SugarRoll.be Fact Sheet for Healthcare Providers: https://www.woods-mathews.com/ This test is not yet approved or cleared by the [place] FDA and  has been authorized for detection and/or diagnosis of SARS-CoV-2 by FDA under an Emergency Use Authorization (EUA). This EUA will remain  in effect (meaning  this test can be used) for the duration of the COVID-19 declaration under Section 56 4(b)(1) of the Act, 21 U.S.C. section 360bbb-3(b)(1), unless the authorization is terminated or revoked sooner. Performed at [facility], [place] [address], [place], [place] [address]   MRSA PCR Screening     Status: None   Collection Time: 09/24/19  9:30 PM   Specimen: Nasopharyngeal  Result Value Ref Range Status   MRSA by PCR NEGATIVE NEGATIVE Final    Comment:        The GeneXpert MRSA Assay (FDA approved for NASAL specimens only), is one component of a comprehensive MRSA colonization surveillance program. It is not intended to diagnose MRSA infection nor to guide or monitor treatment for MRSA infections. Performed at [facility], [place] [address], [place], [place] [address]       Studies: CT HEAD WO CONTRAST  Result Date: 09/30/2019 CLINICAL DATA:  Encephalopathy. History of COPD, atrial fibrillation on Xarelto, coronary artery disease, pulmonary fibrosis. Recent hospitalization for facial cellulitis and hyponatremia. EXAM: CT HEAD WITHOUT CONTRAST TECHNIQUE: Contiguous axial images were obtained from the base of the skull through the vertex without intravenous contrast. COMPARISON:  Head CT dated 09/08/2019. FINDINGS: Brain: Generalized age related parenchymal volume loss with commensurate dilatation of the ventricles and sulci. Chronic small vessel ischemic changes again noted within the bilateral periventricular and subcortical white matter regions. No mass, hemorrhage, edema or other evidence of acute parenchymal abnormality. No extra-axial hemorrhage. Vascular: Chronic calcified  atherosclerotic changes of the large vessels at the skull base. No unexpected hyperdense vessel. Skull: Normal. Negative for fracture or focal lesion. Sinuses/Orbits: No acute finding. Other: None. IMPRESSION: 1. No acute findings. No intracranial mass, hemorrhage or edema. 2. Chronic small vessel ischemic  changes in the white matter. Electronically Signed   By: Franki Cabot M.D.   On: 09/30/2019 10:41   DG CHEST PORT 1 VIEW  Result Date: 09/29/2019 CLINICAL DATA:  Tachypnea EXAM: PORTABLE CHEST 1 VIEW COMPARISON:  09/24/2019 FINDINGS: Single frontal view of the chest demonstrates interval extubation and removal of enteric catheter. Cardiac silhouette is enlarged but stable. Background scarring and fibrosis is again noted. Bilateral ground-glass airspace disease has developed, greatest throughout the right lung and left lung base. There are small bilateral pleural effusions. No pneumothorax. IMPRESSION: 1. Favor interval development of mild pulmonary edema on background scarring and fibrosis. Superimposed infection or aspiration could be considered in the appropriate clinical setting. Electronically Signed   By: Randa Ngo M.D.   On: 09/29/2019 20:22    Scheduled Meds: . apixaban  5 mg Oral BID  . aspirin  81 mg Oral Daily  . Chlorhexidine Gluconate Cloth  6 each Topical Daily  . clopidogrel  75 mg Oral Q breakfast  . erythromycin  1 application Left Eye QID  . feeding supplement (ENSURE ENLIVE)  237 mL Oral TID BM  . levalbuterol  0.63 mg Nebulization Q8H  . levothyroxine  75 mcg Per Tube Q0600  . metoprolol tartrate  37.5 mg Oral BID  . pantoprazole  40 mg Oral Daily  . rosuvastatin  20 mg Oral Daily  . sodium chloride flush  3 mL Intravenous Q12H  . sodium zirconium cyclosilicate  10 g Oral Once  . tamsulosin  0.4 mg Oral Q supper  . vitamin B-12  500 mcg Oral Once per day on Mon Thu    Continuous Infusions: . sodium chloride       LOS: 6 days     Alma Friendly, MD [facility]  If 7PM-7AM, please contact night-coverage [email or web address] 09/30/2019, 4:44 PM

## 2019-10-01 ENCOUNTER — Inpatient Hospital Stay (HOSPITAL_COMMUNITY): Payer: PPO

## 2019-10-01 DIAGNOSIS — F0390 Unspecified dementia without behavioral disturbance: Secondary | ICD-10-CM | POA: Diagnosis not present

## 2019-10-01 DIAGNOSIS — E039 Hypothyroidism, unspecified: Secondary | ICD-10-CM | POA: Diagnosis not present

## 2019-10-01 DIAGNOSIS — G9341 Metabolic encephalopathy: Secondary | ICD-10-CM | POA: Diagnosis not present

## 2019-10-01 DIAGNOSIS — I469 Cardiac arrest, cause unspecified: Secondary | ICD-10-CM | POA: Diagnosis not present

## 2019-10-01 DIAGNOSIS — I5032 Chronic diastolic (congestive) heart failure: Secondary | ICD-10-CM | POA: Diagnosis not present

## 2019-10-01 LAB — BASIC METABOLIC PANEL
Anion gap: 11 (ref 5–15)
BUN: 21 mg/dL (ref 8–23)
CO2: 23 mmol/L (ref 22–32)
Calcium: 8.5 mg/dL — ABNORMAL LOW (ref 8.9–10.3)
Chloride: 109 mmol/L (ref 98–111)
Creatinine, Ser: 1.01 mg/dL (ref 0.61–1.24)
GFR calc Af Amer: >60 mL/min (ref >60)
GFR calc non Af Amer: >60 mL/min (ref >60)
Glucose, Bld: 141 mg/dL — ABNORMAL HIGH (ref 70–99)
Potassium: 4.9 mmol/L (ref 3.5–5.1)
Sodium: 143 mmol/L (ref 135–145)

## 2019-10-01 LAB — CBC WITH DIFFERENTIAL/PLATELET
Abs Immature Granulocytes: 0.12 10*3/uL — ABNORMAL HIGH (ref 0.00–0.07)
Basophils Absolute: 0 10*3/uL (ref 0.0–0.1)
Basophils Relative: 0 %
Eosinophils Absolute: 0 10*3/uL (ref 0.0–0.5)
Eosinophils Relative: 0 %
HCT: 35.5 % — ABNORMAL LOW (ref 39.0–52.0)
Hemoglobin: 11.6 g/dL — ABNORMAL LOW (ref 13.0–17.0)
Immature Granulocytes: 1 %
Lymphocytes Relative: 10 %
Lymphs Abs: 1.3 10*3/uL (ref 0.7–4.0)
MCH: 33.7 pg (ref 26.0–34.0)
MCHC: 32.7 g/dL (ref 30.0–36.0)
MCV: 103.2 fL — ABNORMAL HIGH (ref 80.0–100.0)
Monocytes Absolute: 0.9 10*3/uL (ref 0.1–1.0)
Monocytes Relative: 7 %
Neutro Abs: 10.8 10*3/uL — ABNORMAL HIGH (ref 1.7–7.7)
Neutrophils Relative %: 82 %
Platelets: 245 10*3/uL (ref 150–400)
RBC: 3.44 MIL/uL — ABNORMAL LOW (ref 4.22–5.81)
RDW: 14 % (ref 11.5–15.5)
WBC: 13.2 10*3/uL — ABNORMAL HIGH (ref 4.0–10.5)
nRBC: 0 % (ref 0.0–0.2)

## 2019-10-01 LAB — AMMONIA: Ammonia: 13 umol/L (ref 9–35)

## 2019-10-01 LAB — HEMOGLOBIN A1C
Hgb A1c MFr Bld: 5.7 % — ABNORMAL HIGH (ref 4.8–5.6)
Mean Plasma Glucose: 116.89 mg/dL

## 2019-10-01 LAB — HEPARIN LEVEL (UNFRACTIONATED): Heparin Unfractionated: 2.16 IU/mL — ABNORMAL HIGH (ref 0.30–0.70)

## 2019-10-01 LAB — APTT: aPTT: 87 seconds — ABNORMAL HIGH (ref 24–36)

## 2019-10-01 MED ORDER — METOPROLOL TARTRATE 50 MG PO TABS: 50.0000 mg | ORAL_TABLET | Freq: Two times a day (BID) | ORAL | Status: DC

## 2019-10-01 MED ORDER — SODIUM CHLORIDE 0.9 % IV SOLN
1.5000 g | Freq: Four times a day (QID) | INTRAVENOUS | Status: DC
  Administered 2019-10-01: 11:00:00 1.5 g via INTRAVENOUS
  Filled 2019-10-01 (×4): qty 4

## 2019-10-01 MED ORDER — SODIUM CHLORIDE 0.9 % IV SOLN
2.0000 g | Freq: Two times a day (BID) | INTRAVENOUS | Status: DC
  Administered 2019-10-01 – 2019-10-02 (×3): 2 g via INTRAVENOUS
  Filled 2019-10-01 (×5): qty 2

## 2019-10-01 MED ORDER — METOPROLOL TARTRATE 5 MG/5ML IV SOLN
2.5000 mg | Freq: Four times a day (QID) | INTRAVENOUS | Status: DC
  Administered 2019-10-01 – 2019-10-03 (×8): 2.5 mg via INTRAVENOUS
  Filled 2019-10-01 (×8): qty 5

## 2019-10-01 MED ORDER — METRONIDAZOLE IN NACL 5-0.79 MG/ML-% IV SOLN
500.0000 mg | Freq: Three times a day (TID) | INTRAVENOUS | Status: DC
  Administered 2019-10-01 – 2019-10-03 (×5): 500 mg via INTRAVENOUS
  Filled 2019-10-01 (×5): qty 100

## 2019-10-01 MED ORDER — IOHEXOL 350 MG/ML SOLN
75.0000 mL | Freq: Once | INTRAVENOUS | Status: AC | PRN
  Administered 2019-10-01: 04:00:00 75 mL via INTRAVENOUS

## 2019-10-01 MED ORDER — FUROSEMIDE 10 MG/ML IJ SOLN
40.0000 mg | Freq: Once | INTRAMUSCULAR | Status: AC
  Administered 2019-10-01: 09:00:00 40 mg via INTRAVENOUS
  Filled 2019-10-01: qty 4

## 2019-10-01 MED ORDER — HEPARIN (PORCINE) 25000 UT/250ML-% IV SOLN
1050.0000 [IU]/h | INTRAVENOUS | Status: AC
  Administered 2019-10-01: 12:00:00 1100 [IU]/h via INTRAVENOUS
  Filled 2019-10-01: qty 250

## 2019-10-01 MED ORDER — STROKE: EARLY STAGES OF RECOVERY BOOK
Freq: Once | Status: DC
  Filled 2019-10-01: qty 1

## 2019-10-01 MED ORDER — RESOURCE THICKENUP CLEAR PO POWD
ORAL | Status: DC | PRN
  Filled 2019-10-01: qty 125

## 2019-10-01 NOTE — Progress Notes (Signed)
Pt remains tachypneic and tachycardic with mild improvement.  Bladder scan performed today.  Pt retaining only 210. Lasix therapy administered.  Pt's total urinary output for day shift was good at 1625 mL.  Pt's neuro status remained  stable and unchanged throughout day shift with mild improvement in PT's interactiveness with verbal communication. Pt will continue to be monitored.

## 2019-10-01 NOTE — Progress Notes (Signed)
Physical Therapy RE- evaluation Patient Details Name: Cameron [name] MRN: [id number] DOB: Jul 01, 1925 Today's Date: 10/01/2019    History of Present Illness 86 year old male with past medical history of gastroesophageal reflux disease, COPD, chronic atrial fibrillation on anticoagulation, diastolic congestive heart failure and recent admission to [facility] for odontogenic facial cellulitis who presents to [facility] emergency department after exhibiting a several day history of progressively worsening lethargy and confusion.. Pt found to have multiple small R MCA infarcts on 4/3.    PT Comments    Pt with AMS yesterday and was found to have multiple small R MCA infarcts. Pt more lethargic with tachycardia and inc RR at rest, even further elevated with activity. Pt was able to transfer to chair and amb short distances a few days ago now requiring maxAx2 for bed mobility and a hoyer lift for OOB to chair. Pt with noted regression both functionally and medically. Aware palliative consult was placed today. Will re-assess therapy frequency once family meets with palliative as family desires therapy daily however pt can not tolerate it at this time (discussed with daughter).  Acute PT to cont to follow until otherwise further notified of change in medical plan.   Follow Up Recommendations  SNF;Supervision/Assistance - 24 hour     Equipment Recommendations  Rolling walker with 5" wheels    Recommendations for Other Services       Precautions / Restrictions Precautions Precautions: Fall Precaution Comments: watch RR and HR, both tachy Restrictions Weight Bearing Restrictions: No RUE Weight Bearing: Non weight bearing    Mobility  Bed Mobility Overal bed mobility: Needs Assistance Bed Mobility: Supine to Sit;Sit to Supine     Supine to sit: Max assist Sit to supine: Max assist;+2 for physical assistance   General bed mobility comments: pt with minimal initiation,  maxA for trunk elevation, minA for LE management of EOB, maxAx2 to return to supine, increased RR and HR with movement, noted SOB  Transfers                 General transfer comment: didn't attempt standing due to tachycardia into 130s-140 and RR up to 40  Ambulation/Gait             General Gait Details: unable this date   Stairs             Wheelchair Mobility    Modified Rankin (Stroke Patients Only) Modified Rankin (Stroke Patients Only) Pre-Morbid Rankin Score: Moderately severe disability Modified Rankin: Severe disability     Balance Overall balance assessment: Needs assistance Sitting-balance support: No upper extremity supported Sitting balance-Leahy Scale: Poor Sitting balance - Comments: pt didn't use hands to support self despite verbal cues, modA posteriorly to maintain EOB balance, worked on shoulder retraction       Standing balance comment: unable to stand this date                            Cognition Arousal/Alertness: Lethargic Behavior During Therapy: Flat affect Overall Cognitive Status: Impaired/Different from baseline Area of Impairment: Following commands;Problem solving                       Following Commands: Follows one step commands with increased time     Problem Solving: Slow processing;Decreased initiation;Difficulty sequencing;Requires verbal cues;Requires tactile cues General Comments: pt slow to respond, maintained eyes closed majority of time, was appropriate when he did communicate,  pt appears very tired/fatigued, pt with noted difficulty with breathing as RR in 30s up to 40      Exercises      General Comments General comments (skin integrity, edema, etc.): HR 110-140s, RR 30s-40s, noted labored breathing, minimal eye opening and communication      Pertinent Vitals/Pain Pain Assessment: No/denies pain(howeverl grimaced with mothing of UEs)    Home Living                       Prior Function            PT Goals (current goals can now be found in the care plan section) Progress towards PT goals: Not progressing toward goals - comment    Frequency    Min 3X/week      PT Plan Current plan remains appropriate    Co-evaluation PT/OT/SLP Co-Evaluation/Treatment: Yes Reason for Co-Treatment: Complexity of the patient's impairments (multi-system involvement) PT goals addressed during session: Mobility/safety with mobility        AM-PAC PT "6 Clicks" Mobility   Outcome Measure  Help needed turning from your back to your side while in a flat bed without using bedrails?: A Lot Help needed moving from lying on your back to sitting on the side of a flat bed without using bedrails?: A Lot Help needed moving to and from a bed to a chair (including a wheelchair)?: Total Help needed standing up from a chair using your arms (e.g., wheelchair or bedside chair)?: Total Help needed to walk in hospital room?: Total Help needed climbing 3-5 steps with a railing? : Total 6 Click Score: 8    End of Session   Activity Tolerance: Patient limited by lethargy;Patient limited by fatigue Patient left: in bed;with call bell/phone within reach;with family/visitor present(SLP in room, left pt in chair position) Nurse Communication: Mobility status PT Visit Diagnosis: Other abnormalities of gait and mobility (R26.89);Difficulty in walking, not elsewhere classified (R26.2)     Time: [id number] PT Time Calculation (min) (ACUTE ONLY): 23 min  Charges:                        Kittie Plater, PT, DPT Acute Rehabilitation Services Pager #: [phone] Office #: [phone]    Berline Lopes 10/01/2019, 11:42 AM

## 2019-10-01 NOTE — Evaluation (Signed)
Clinical/Bedside Swallow Evaluation Patient Details  Name: Cameron [name] MRN: [id number] Date of Birth: 1925-11-24  Today's Date: 10/01/2019 Time: SLP Start Time (ACUTE ONLY): 1043 SLP Stop Time (ACUTE ONLY): 1118 SLP Time Calculation (min) (ACUTE ONLY): 35 min  Past Medical History:  Past Medical History:  Diagnosis Date  . Arthritis   . CHF (congestive heart failure) ([place])   . COPD (chronic obstructive pulmonary disease) ([place])   . Dysrhythmia    atrial fibrilation  . GERD (gastroesophageal reflux disease)   . Pulmonary fibrosis ([place])    Past Surgical History:  Past Surgical History:  Procedure Laterality Date  . CORONARY STENT INTERVENTION N/A 10/02/2019   Procedure: CORONARY STENT INTERVENTION;  Surgeon: Belva Crome, MD;  Location: [place] [facility];  Service: Cardiovascular;  Laterality: N/A;  . hemmroidectmy    . HERNIA REPAIR    . LEFT HEART CATH AND CORONARY ANGIOGRAPHY N/A 10/07/2019   Procedure: LEFT HEART CATH AND CORONARY ANGIOGRAPHY;  Surgeon: Belva Crome, MD;  Location: [place] [facility];  Service: Cardiovascular;  Laterality: N/A;  . LEFT HEART CATHETERIZATION WITH CORONARY ANGIOGRAM N/A 10/07/2012   Procedure: LEFT HEART CATHETERIZATION WITH CORONARY ANGIOGRAM, possible PCI;  Surgeon: Jettie Booze, MD;  Location: [facility] C[facility];  Service: Cardiovascular;  Laterality: N/A;  . TONSILLECTOMY     HPI:  Pt is a 86 year old male with past medical history of GERD, COPD, chronic Afib, hiatal hernia, and diastolic CHF  who presented to the ED after exhibiting a several day history of progressively worsening lethargy and confusion diagnosed with acute encephalopathy and found to have UTI. Vtach arrest on 3/28; CPR 4 mins; ETT 3/28-3/30 at 11:20. MRI brain 4/3: Few (less than 10) punctate acute infarcts in the right MCA territory.    Assessment / Plan / Recommendation Clinical Impression  Pt was seen for bedside swallow re-evaluation due to change in status and CVA.  Pt's daughter reported that the pt had been tolerating the diet that was recommended on 09/27/19 but demonstrated difficulty masticating chicken during one of his more recent meals. Oral mechanism exam was significant for reduced lingual strength and ROM. Pt's respiratory rate was elevated at baseline and intermittently during the evaluation placing him at increased risk for aspiration. He tolerated solids without overt s/sx of aspiration but inconsistently exhibited coughing following thin liquids via tsp and straw, suggesting possible aspiration. Mastication time was moderately increased and mild-moderate oral residue was noted. Residue was reduced but not eliminated with a liquid wash. A modified barium swallow study is clinically indicated at this time to further assess the functional integrity of the swallow mechanism. Pt, RN, and his daughters (one at bedside and one on Facetime) were educated regarding results and recommendations. All parties verbalized understanding as well as agreement with plan of care.  SLP Visit Diagnosis: Dysphagia, oropharyngeal phase (R13.12)    Aspiration Risk  Mild aspiration risk;Moderate aspiration risk    Diet Recommendation NPO   Medication Administration: Via alternative means    Other  Recommendations Oral Care Recommendations: Oral care BID   Follow up Recommendations Other (comment)(TBD)      Frequency and Duration min 2x/week  2 weeks       Prognosis Prognosis for Safe Diet Advancement: Fair Barriers to Reach Goals: Cognitive deficits      Swallow Study   General Date of Onset: 09/26/19 HPI: Pt is a 86 year old male with past medical history of GERD, COPD, chronic Afib, hiatal hernia, and diastolic CHF  who presented to the ED after exhibiting a several day history of progressively worsening lethargy and confusion diagnosed with acute encephalopathy and found to have UTI. Vtach arrest on 3/28; CPR 4 mins; ETT 3/28-3/30 at 11:20. MRI brain 4/3: Few  (less than 10) punctate acute infarcts in the right MCA territory.  Type of Study: Bedside Swallow Evaluation Previous Swallow Assessment: None Diet Prior to this Study: NPO Temperature Spikes Noted: No Respiratory Status: Nasal cannula History of Recent Intubation: Yes Length of Intubations (days): 2 days Date extubated: 09/26/19 Behavior/Cognition: Alert;Cooperative;Pleasant mood Oral Cavity Assessment: Within Functional Limits Oral Care Completed by SLP: No Oral Cavity - Dentition: Adequate natural dentition Vision: Functional for self-feeding Self-Feeding Abilities: Needs assist Patient Positioning: Upright in bed;Postural control adequate for testing Baseline Vocal Quality: Hoarse;Low vocal intensity Volitional Cough: Weak Volitional Swallow: Able to elicit    Oral/Motor/Sensory Function Overall Oral Motor/Sensory Function: Mild impairment Facial ROM: Within Functional Limits Facial Symmetry: Within Functional Limits Facial Strength: Within Functional Limits Facial Sensation: Within Functional Limits Lingual ROM: Reduced left;Reduced right;Suspected CN XII (hypoglossal) dysfunction Lingual Symmetry: Within Functional Limits Lingual Strength: Reduced Lingual Sensation: Within Functional Limits Velum: Within Functional Limits Mandible: Within Functional Limits   Ice Chips Ice chips: Within functional limits Presentation: Spoon   Thin Liquid Thin Liquid: Impaired Presentation: Straw;Spoon Pharyngeal  Phase Impairments: Cough - Delayed;Throat Clearing - Immediate    Nectar Thick Nectar Thick Liquid: Not tested   Honey Thick Honey Thick Liquid: Not tested   Puree Puree: Within functional limits Presentation: Spoon   Solid     Solid: Impaired Presentation: Self Fed Oral Phase Impairments: Impaired mastication Oral Phase Functional Implications: Oral residue(Moderate reduced with liquid washes )     Bama Hanselman I. [name], [place], [place] Office number [phone] Pager [phone]  Cameron [name] 10/01/2019,1:16 PM

## 2019-10-01 NOTE — Plan of Care (Signed)
  Problem: Health Behavior/Discharge Planning: Goal: Ability to manage health-related needs will improve Outcome: Not Progressing   Problem: Clinical Measurements: Goal: Respiratory complications will improve Outcome: Not Progressing   Problem: Activity: Goal: Risk for activity intolerance will decrease Outcome: Not Progressing   Problem: Nutrition: Goal: Adequate nutrition will be maintained Outcome: Not Progressing

## 2019-10-01 NOTE — Progress Notes (Signed)
Pharmacy Antibiotic Note  Cameron [name] is a 86 y.o. male admitted on 09/12/2019 with aspiration pneumonia. Pt recently completed 7-day course of CTX for Ecoli UTI. Pharmacy was consulted for Unasyn dosing earlier today, now to transition to cefepime + metronidazole for broader coverage.  Plan: -Metronidazole 500mg  IV q8h per MD -Cefepime 2g IV q12h -Stop Unasyn   Height: 6' (182.9 cm) Weight: 65.5 kg (144 lb 6.4 oz) IBW/kg (Calculated) : 77.6  Temp (24hrs), Avg:98.3 F (36.8 C), Min:97.5 F (36.4 C), Max:99.1 F (37.3 C)  Recent Labs  Lab 09/25/19 0435 09/26/19 0940 09/27/19 0653 09/27/19 0653 10/24/2019 0237 09/29/19 0349 09/30/19 0342 09/30/19 0757 10/01/19 0317  WBC 13.6*   < > 9.6  --  9.5 8.7 11.0*  --  13.2*  CREATININE 1.11   < > 1.20   < > 0.94 1.06 0.94 0.88 1.01  LATICACIDVEN 2.6*  --   --   --   --   --   --   --   --    < > = values in this interval not displayed.    Estimated Creatinine Clearance: 44.1 mL/min (by C-G formula based on SCr of 1.01 mg/dL).    No Known Allergies  Antimicrobials this admission: CTX 3/28 >> 4/3 Unasyn 4/4 x1 Cefepime 4/4 >> Metronidazole 4/4 >>  Dose adjustments this admission: N/A  Microbiology results: 3/28 BCx: negative 3/28 UCx: Ecoli  3/28 MRSA PCR: negative   Arrie Senate, PharmD, BCPS Clinical Pharmacist [phone] Please check AMION for all [place] numbers 10/01/2019

## 2019-10-01 NOTE — Progress Notes (Signed)
Occupational Therapy Re-Evaluation Patient Details Name: Cameron [name] MRN: [id number] DOB: 05/13/1926 Today's Date: 10/01/2019    History of present illness 86 year old male with past medical history of gastroesophageal reflux disease, COPD, chronic atrial fibrillation on anticoagulation, diastolic congestive heart failure and recent admission to [facility] for odontogenic facial cellulitis who presents to [facility] emergency department after exhibiting a several day history of progressively worsening lethargy and confusion.. Pt found to have multiple small R MCA infarcts on 4/3.   OT comments  Pt seen for OT re evaluation following new R MCA Infarcts. Work up now also showing PNA. Pt currently max A +2 to sit EOB. Once EOB requiring fluctuating mod-max A to maintain posture. HR up to 140s and RR up to 40s with minimal activity. Pt completed grooming with max A with noticeable increase in vital signs.Pt able to tolerate sitting EOB ~5 minutes before needing to be supine again. Pt having difficulty consistently verbalizing and keeping eyes open. Recommend palliative consult, and continue to recommend SNF level therapies as it fits into potential palliative plan. Pt does not display the tolerance for intensive therapy at this time. Will continue to follow.    Follow Up Recommendations  SNF;Supervision/Assistance - 24 hour    Equipment Recommendations  None recommended by OT    Recommendations for Other Services Other (comment)(Palliative)    Precautions / Restrictions Precautions Precautions: Fall Precaution Comments: watch RR and HR, both tachy Restrictions Weight Bearing Restrictions: No       Mobility Bed Mobility Overal bed mobility: Needs Assistance Bed Mobility: Supine to Sit;Sit to Supine     Supine to sit: Max assist Sit to supine: Max assist;+2 for physical assistance   General bed mobility comments: pt with minimal initiation, maxA for trunk  elevation, minA for LE management of EOB, maxAx2 to return to supine, increased RR and HR with movement, noted SOB  Transfers                 General transfer comment: didn't attempt standing due to tachycardia into 130s-140 and RR up to 40    Balance Overall balance assessment: Needs assistance Sitting-balance support: No upper extremity supported Sitting balance-Leahy Scale: Poor Sitting balance - Comments: pt didn't use hands to support self despite verbal cues, modA posteriorly to maintain EOB balance, worked on shoulder retraction       Standing balance comment: unable to stand this date                           ADL either performed or assessed with clinical judgement   ADL Overall ADL's : Needs assistance/impaired Eating/Feeding: NPO   Grooming: Maximal assistance;Sitting Grooming Details (indicate cue type and reason): max A to bring hand to face, fatigued quickly with RR up to 42 Upper Body Bathing: Moderate assistance;Sitting   Lower Body Bathing: Sit to/from stand;Sitting/lateral leans;Total assistance   Upper Body Dressing : Maximal assistance;Sitting   Lower Body Dressing: Total assistance;Sitting/lateral leans;Sit to/from Health and safety inspector Details (indicate cue type and reason): not able to tolerate OOB activity this date, RR up to 40s with EOB activity Toileting- Clothing Manipulation and Hygiene: Maximal assistance;Sit to/from stand         General ADL Comments: pt not able to tolerate OOB activity this date, RR up to 40s EOB with HR into 130s bpm     Vision Baseline Vision/History: Wears glasses Wears Glasses: At all  times Patient Visual Report: No change from baseline     Perception     Praxis      Cognition Arousal/Alertness: Lethargic Behavior During Therapy: Flat affect Overall Cognitive Status: Impaired/Different from baseline Area of Impairment: Following commands;Problem solving                    Current Attention Level: Sustained   Following Commands: Follows one step commands with increased time     Problem Solving: Slow processing;Decreased initiation;Difficulty sequencing;Requires verbal cues;Requires tactile cues General Comments: pt slow to respond, maintained eyes closed majority of time, was appropriate when he did communicate, pt appears very tired/fatigued, pt with noted difficulty with breathing as RR in 30s up to 40        Exercises     Shoulder Instructions       General Comments HR 110-140s, RR 30s-40s, noted labored breathing, minimal eye opening and communication    Pertinent Vitals/ Pain       Pain Assessment: No/denies pain  Home Living                                          Prior Functioning/Environment              Frequency  Min 2X/week        Progress Toward Goals  OT Goals(current goals can now be found in the care plan section)  Progress towards OT goals: Goals drowngraded-see care plan  Acute Rehab OT Goals Patient Stated Goal: return to independence OT Goal Formulation: With patient/family Time For Goal Achievement: 10/11/19 Potential to Achieve Goals: [place] Discharge plan remains appropriate    Co-evaluation    PT/OT/SLP Co-Evaluation/Treatment: Yes Reason for Co-Treatment: Complexity of the patient's impairments (multi-system involvement);For patient/therapist safety;To address functional/ADL transfers PT goals addressed during session: Mobility/safety with mobility OT goals addressed during session: ADL's and self-care      AM-PAC OT "6 Clicks" Daily Activity     Outcome Measure   Help from another person eating meals?: Total(NPO) Help from another person taking care of personal grooming?: A Lot Help from another person toileting, which includes using toliet, bedpan, or urinal?: Total Help from another person bathing (including washing, rinsing, drying)?: Total Help from another person to  put on and taking off regular upper body clothing?: A Lot Help from another person to put on and taking off regular lower body clothing?: Total 6 Click Score: 8    End of Session    OT Visit Diagnosis: Unsteadiness on feet (R26.81);Other abnormalities of gait and mobility (R26.89);Muscle weakness (generalized) (M62.81);Other symptoms and signs involving cognitive function   Activity Tolerance Patient limited by fatigue;Treatment limited secondary to medical complications (Comment);Other (comment)(RR/HR)   Patient Left in chair;with call bell/phone within reach;with chair alarm set;with family/visitor present   Nurse Communication Mobility status        Time: 1035-1055 OT Time Calculation (min): 20 min  Charges: OT General Charges $OT Visit: 1 Visit OT Evaluation $OT Re-eval: 1 Re-eval  Zenovia Jarred, MSOT, OTR/L Acute Rehabilitation Services [facility] Office Number: [phone] Pager: [phone]  Zenovia Jarred 10/01/2019, 1:58 PM

## 2019-10-01 NOTE — Progress Notes (Signed)
Progress Note  Patient Name: Cameron [name] Date of Encounter: 10/01/2019  Primary Cardiologist: Dr Harrington Challenger  Subjective   Pt denies CP; complains of dyspnea; lethargic  Inpatient Medications    Scheduled Meds: .  stroke: mapping our early stages of recovery book   Does not apply Once  . apixaban  5 mg Oral BID  . aspirin  81 mg Oral Daily  . Chlorhexidine Gluconate Cloth  6 each Topical Daily  . clopidogrel  75 mg Oral Q breakfast  . erythromycin  1 application Left Eye QID  . feeding supplement (ENSURE ENLIVE)  237 mL Oral TID BM  . furosemide  40 mg Intravenous Once  . levalbuterol  0.63 mg Nebulization Q8H  . levothyroxine  75 mcg Per Tube Q0600  . metoprolol tartrate  37.5 mg Oral BID  . pantoprazole  40 mg Oral Daily  . rosuvastatin  20 mg Oral Daily  . sodium chloride flush  3 mL Intravenous Q12H  . tamsulosin  0.4 mg Oral Q supper  . vitamin B-12  500 mcg Oral Once per day on Mon Thu   Continuous Infusions: . sodium chloride     PRN Meds: sodium chloride, acetaminophen, albuterol, metoprolol tartrate, ondansetron (ZOFRAN) IV, polyethylene glycol, sodium chloride flush   Vital Signs    Vitals:   09/30/19 2330 10/01/19 0404 10/01/19 0532 10/01/19 0803  BP: 123/74 118/72  131/76  Pulse: (!) 103 (!) 105 100 (!) 120  Resp: (!) 23 (!) 32 (!) 32 (!) 31  Temp: 99.1 F (37.3 C) (!) 97.5 F (36.4 C)  97.7 F (36.5 C)  TempSrc: Oral Oral  Oral  SpO2: 90% 94% 91% 91%  Weight:      Height:        Intake/Output Summary (Last 24 hours) at 10/01/2019 0845 Last data filed at 09/30/2019 2357 Gross per 24 hour  Intake 0 ml  Output --  Net 0 ml   Last 3 Weights 10/05/2019 09/27/2019 09/27/2019  Weight (lbs) 144 lb 6.4 oz 143 lb 15.4 oz 141 lb 12.1 oz  Weight (kg) 65.5 kg 65.3 kg 64.3 kg      Telemetry    Atrial fibrillation rate elevated- Personally Reviewed  Physical Exam   GEN: WD frail Neck: supple Cardiac: irregular, tachycardic Respiratory: Basilar crackles;  no wheeze GI: Soft, NT/ND MS: No edema Neuro:  grossly intact Psych: Normal affect   Labs    High Sensitivity Troponin:   Recent Labs  Lab 09/24/19 2103 09/24/19 2346  TROPONINIHS 10,382* 9,437*      Chemistry Recent Labs  Lab 09/24/19 2103 09/24/19 2138 09/25/19 0435 09/26/19 0940 09/30/19 0342 09/30/19 0757 10/01/19 0317  NA 133*   < > 135   < > 143 140 143  K 3.7   < > 4.6   < > 6.0* 4.9 4.9  CL 103   < > 102   < > 110 108 109  CO2 18*   < > 21*   < > 20* 19* 23  GLUCOSE 130*   < > 124*   < > 132* 128* 141*  BUN 19   < > 20   < > 18 17 21   CREATININE 1.26*   < > 1.11   < > 0.94 0.88 1.01  CALCIUM 8.2*   < > 8.6*   < > 8.8* 8.5* 8.5*  PROT 5.8*  --  6.5  --   --   --   --  ALBUMIN 2.3*  --  2.6*  --   --   --   --   AST 123*  --  93*  --   --   --   --   ALT 74*  --  68*  --   --   --   --   ALKPHOS 47  --  49  --   --   --   --   BILITOT 1.2  --  0.9  --   --   --   --   GFRNONAA 50*   < > 58*   < > >60 >60 >60  GFRAA 57*   < > >60   < > >60 >60 >60  ANIONGAP 12   < > 12   < > 13 13 11    < > = values in this interval not displayed.     Hematology Recent Labs  Lab 09/29/19 0349 09/30/19 0342 10/01/19 0317  WBC 8.7 11.0* 13.2*  RBC 3.59* 3.97* 3.44*  HGB 12.1* 13.3 11.6*  HCT 36.3* 41.2 35.5*  MCV 101.1* 103.8* 103.2*  MCH 33.7 33.5 33.7  MCHC 33.3 32.3 32.7  RDW 13.2 13.7 14.0  PLT 243 247 245    Radiology    CT ANGIO HEAD W OR WO CONTRAST  Result Date: 10/01/2019 CLINICAL DATA:  Stroke follow-up EXAM: CT ANGIOGRAPHY HEAD AND NECK TECHNIQUE: Multidetector CT imaging of the head and neck was performed using the standard protocol during bolus administration of intravenous contrast. Multiplanar CT image reconstructions and MIPs were obtained to evaluate the vascular anatomy. Carotid stenosis measurements (when applicable) are obtained utilizing NASCET criteria, using the distal internal carotid diameter as the denominator. CONTRAST:  60mL OMNIPAQUE  IOHEXOL 350 MG/ML SOLN COMPARISON:  Brain MRI 09/30/2019 FINDINGS: CT HEAD FINDINGS Brain: There is no mass, hemorrhage or extra-axial collection. The size and configuration of the ventricles and extra-axial CSF spaces are normal. There is hypoattenuation of the periventricular white matter, most commonly indicating chronic ischemic microangiopathy. Skull: The visualized skull base, calvarium and extracranial soft tissues are normal. Sinuses/Orbits: No fluid levels or advanced mucosal thickening of the visualized paranasal sinuses. No mastoid or middle ear effusion. The orbits are normal. CTA NECK FINDINGS SKELETON: There is no bony spinal canal stenosis. No lytic or blastic lesion. OTHER NECK: Normal pharynx, larynx and major salivary glands. No cervical lymphadenopathy. Unremarkable thyroid gland. UPPER CHEST: Severe bilateral interstitial lung disease with small pleural effusions. AORTIC ARCH: There is mild calcific atherosclerosis of the aortic arch. There is no aneurysm, dissection or hemodynamically significant stenosis of the visualized portion of the aorta. Conventional 3 vessel aortic branching pattern. The visualized proximal subclavian arteries are widely patent. RIGHT CAROTID SYSTEM: No dissection, occlusion or aneurysm. Mild atherosclerotic calcification at the carotid bifurcation without hemodynamically significant stenosis. LEFT CAROTID SYSTEM: No dissection, occlusion or aneurysm. There is predominantly calcified atherosclerosis extending into the proximal ICA, resulting in 50% stenosis. VERTEBRAL ARTERIES: Left dominant configuration. Both origins are clearly patent. There is no dissection, occlusion or flow-limiting stenosis to the skull base (V1-V3 segments). CTA HEAD FINDINGS POSTERIOR CIRCULATION: --Vertebral arteries: Normal V4 segments. --Posterior inferior cerebellar arteries (PICA): Patent origins from the vertebral arteries. --Anterior inferior cerebellar arteries (AICA): Patent origins  from the basilar artery. --Basilar artery: Normal. --Superior cerebellar arteries: Normal. --Posterior cerebral arteries: Normal. Both originate from the basilar artery. Posterior communicating arteries (p-comm) are diminutive or absent. ANTERIOR CIRCULATION: --Intracranial internal carotid arteries: Atherosclerotic calcification of the internal carotid arteries  at the skull base without hemodynamically significant stenosis. --Anterior cerebral arteries (ACA): Normal. Both A1 segments are present. Patent anterior communicating artery (a-comm). --Middle cerebral arteries (MCA): Normal. VENOUS SINUSES: As permitted by contrast timing, patent. ANATOMIC VARIANTS: None Review of the MIP images confirms the above findings. IMPRESSION: 1. No emergent large vessel occlusion. 2. 50% stenosis of the proximal left ICA secondary to predominantly calcified atherosclerosis. 3. Severe interstitial lung disease with small pleural effusions. Electronically Signed   By: Ulyses Jarred M.D.   On: 10/01/2019 04:17   CT HEAD WO CONTRAST  Result Date: 09/30/2019 CLINICAL DATA:  Encephalopathy. History of COPD, atrial fibrillation on Xarelto, coronary artery disease, pulmonary fibrosis. Recent hospitalization for facial cellulitis and hyponatremia. EXAM: CT HEAD WITHOUT CONTRAST TECHNIQUE: Contiguous axial images were obtained from the base of the skull through the vertex without intravenous contrast. COMPARISON:  Head CT dated 09/15/2019. FINDINGS: Brain: Generalized age related parenchymal volume loss with commensurate dilatation of the ventricles and sulci. Chronic small vessel ischemic changes again noted within the bilateral periventricular and subcortical white matter regions. No mass, hemorrhage, edema or other evidence of acute parenchymal abnormality. No extra-axial hemorrhage. Vascular: Chronic calcified atherosclerotic changes of the large vessels at the skull base. No unexpected hyperdense vessel. Skull: Normal. Negative for  fracture or focal lesion. Sinuses/Orbits: No acute finding. Other: None. IMPRESSION: 1. No acute findings. No intracranial mass, hemorrhage or edema. 2. Chronic small vessel ischemic changes in the white matter. Electronically Signed   By: Franki Cabot M.D.   On: 09/30/2019 10:41   CT ANGIO NECK W OR WO CONTRAST  Result Date: 10/01/2019 CLINICAL DATA:  Stroke follow-up EXAM: CT ANGIOGRAPHY HEAD AND NECK TECHNIQUE: Multidetector CT imaging of the head and neck was performed using the standard protocol during bolus administration of intravenous contrast. Multiplanar CT image reconstructions and MIPs were obtained to evaluate the vascular anatomy. Carotid stenosis measurements (when applicable) are obtained utilizing NASCET criteria, using the distal internal carotid diameter as the denominator. CONTRAST:  63mL OMNIPAQUE IOHEXOL 350 MG/ML SOLN COMPARISON:  Brain MRI 09/30/2019 FINDINGS: CT HEAD FINDINGS Brain: There is no mass, hemorrhage or extra-axial collection. The size and configuration of the ventricles and extra-axial CSF spaces are normal. There is hypoattenuation of the periventricular white matter, most commonly indicating chronic ischemic microangiopathy. Skull: The visualized skull base, calvarium and extracranial soft tissues are normal. Sinuses/Orbits: No fluid levels or advanced mucosal thickening of the visualized paranasal sinuses. No mastoid or middle ear effusion. The orbits are normal. CTA NECK FINDINGS SKELETON: There is no bony spinal canal stenosis. No lytic or blastic lesion. OTHER NECK: Normal pharynx, larynx and major salivary glands. No cervical lymphadenopathy. Unremarkable thyroid gland. UPPER CHEST: Severe bilateral interstitial lung disease with small pleural effusions. AORTIC ARCH: There is mild calcific atherosclerosis of the aortic arch. There is no aneurysm, dissection or hemodynamically significant stenosis of the visualized portion of the aorta. Conventional 3 vessel aortic  branching pattern. The visualized proximal subclavian arteries are widely patent. RIGHT CAROTID SYSTEM: No dissection, occlusion or aneurysm. Mild atherosclerotic calcification at the carotid bifurcation without hemodynamically significant stenosis. LEFT CAROTID SYSTEM: No dissection, occlusion or aneurysm. There is predominantly calcified atherosclerosis extending into the proximal ICA, resulting in 50% stenosis. VERTEBRAL ARTERIES: Left dominant configuration. Both origins are clearly patent. There is no dissection, occlusion or flow-limiting stenosis to the skull base (V1-V3 segments). CTA HEAD FINDINGS POSTERIOR CIRCULATION: --Vertebral arteries: Normal V4 segments. --Posterior inferior cerebellar arteries (PICA): Patent origins from the vertebral  arteries. --Anterior inferior cerebellar arteries (AICA): Patent origins from the basilar artery. --Basilar artery: Normal. --Superior cerebellar arteries: Normal. --Posterior cerebral arteries: Normal. Both originate from the basilar artery. Posterior communicating arteries (p-comm) are diminutive or absent. ANTERIOR CIRCULATION: --Intracranial internal carotid arteries: Atherosclerotic calcification of the internal carotid arteries at the skull base without hemodynamically significant stenosis. --Anterior cerebral arteries (ACA): Normal. Both A1 segments are present. Patent anterior communicating artery (a-comm). --Middle cerebral arteries (MCA): Normal. VENOUS SINUSES: As permitted by contrast timing, patent. ANATOMIC VARIANTS: None Review of the MIP images confirms the above findings. IMPRESSION: 1. No emergent large vessel occlusion. 2. 50% stenosis of the proximal left ICA secondary to predominantly calcified atherosclerosis. 3. Severe interstitial lung disease with small pleural effusions. Electronically Signed   By: Ulyses Jarred M.D.   On: 10/01/2019 04:17   MR BRAIN WO CONTRAST  Result Date: 09/30/2019 CLINICAL DATA:  Encephalopathy EXAM: MRI HEAD WITHOUT  CONTRAST TECHNIQUE: Multiplanar, multiecho pulse sequences of the brain and surrounding structures were obtained without intravenous contrast. COMPARISON:  2018 FINDINGS: Brain: Few scattered punctate foci of reduced diffusion in the right frontoparietal white matter as well as the right lentiform nucleus. There is no evidence of intracranial hemorrhage. Patchy and confluent T2 hyperintensity in the supratentorial much greater than pontine white matter is nonspecific but probably reflects moderate chronic microvascular ischemic changes. Prominence of the ventricles and sulci reflects generalized parenchymal volume loss. There is no intracranial mass, mass effect, or edema. There is no hydrocephalus or extra-axial fluid collection. Vascular: Diminished proximal right intracranial vertebral artery flow void also present on the prior study. Major vessel flow voids at the skull base are otherwise preserved. Skull and upper cervical spine: Normal marrow signal is preserved. Sinuses/Orbits: Trace mucosal thickening. Bilateral lens replacements. Other: Sella is partially empty.  Mastoid air cells are clear. IMPRESSION: Few (less than 10) punctate acute infarcts in the right MCA territory. Moderate chronic microvascular ischemic changes. Electronically Signed   By: Macy Mis M.D.   On: 09/30/2019 18:53   DG CHEST PORT 1 VIEW  Result Date: 09/29/2019 CLINICAL DATA:  Tachypnea EXAM: PORTABLE CHEST 1 VIEW COMPARISON:  09/24/2019 FINDINGS: Single frontal view of the chest demonstrates interval extubation and removal of enteric catheter. Cardiac silhouette is enlarged but stable. Background scarring and fibrosis is again noted. Bilateral ground-glass airspace disease has developed, greatest throughout the right lung and left lung base. There are small bilateral pleural effusions. No pneumothorax. IMPRESSION: 1. Favor interval development of mild pulmonary edema on background scarring and fibrosis. Superimposed infection  or aspiration could be considered in the appropriate clinical setting. Electronically Signed   By: Randa Ngo M.D.   On: 09/29/2019 20:22    Patient Profile     86 y.o. male with past medical history of permanent atrial fibrillation, chronic diastolic congestive heart failure, dementia, COPD admitted with encephalopathy.  Patient had an episode of ventricular tachycardia/torsades requiring defibrillation.  Troponins were elevated and patient subsequently had PCI of left circumflex.  Echocardiogram this admission showed ejection fraction 35 to 40% with inferior/inferolateral akinesis, mild right ventricular enlargement, severe right atrial enlargement, moderate mitral regurgitation, moderate tricuspid regurgitation, mild to moderate aortic insufficiency.  Chest CT shows pulmonary fibrosis.  Assessment & Plan    1 Non-ST elevation myocardial infarction-patient is status post PCI of left circumflex.  Continue aspirin, Plavix and statin.  After 2 weeks will discontinue aspirin given need for apixaban.  2 paroxysmal atrial fibrillation-patient remains in atrial fibrillation today.  His  heart rate is elevated.  Not clear that he is taking all of his oral medications.  Will change metoprolol to 2.5 mg IV every 6 hours.  Apixaban has been discontinued and patient started on IV heparin.  Will transition back to apixaban once he is tolerating oral medications.  3 status post cardiac arrest-possibly related to non-ST elevation myocardial infarction.  QTC was also prolonged and Lexapro discontinued.  4 ischemic cardiomyopathy-we will continue beta-blocker.  We will not add an ARB as blood pressure is borderline and we are increasing metoprolol for atrial fibrillation.  He has pleural effusions on his imaging.  We will give Lasix 40 mg IV x1.  4 COPD/pulmonary fibrosis  5 hyperlipidemia-continue statin.  Patient is extremely frail and has had multiple recent events including cardiac arrest, PCI and now  CVA.  He is also 86 years old and is a no CODE BLUE.  I have discussed patient with his daughter.  We will arrange a palliative care consult.  For questions or updates, please contact [place] Please consult [email or web address] for contact info under        Signed, Kirk Ruths, MD  10/01/2019, 8:45 AM

## 2019-10-01 NOTE — Progress Notes (Addendum)
Pharmacy Antibiotic Note  Cameron [name] is a 86 y.o. male admitted on 08/31/2019 with aspiration pneumonia. Pt recently completed 7-day course of CTX for Ecoli UTI. Pharmacy has been consulted for Unasyn dosing. Patient is afebrile with elevated WBC of 13.2. Scr increased but wnl at 1.01. CXR today pending, on 4/2 CXR showed possible superimposed infx or aspiration but favored mild pulmonary edema on background.  Plan: - Start Unasyn 1.5g IV Q6 hrs - Monitor renal function, cultures/sensitivities, and clinical progression - F/u ABX LOT  Height: 6' (182.9 cm) Weight: 65.5 kg (144 lb 6.4 oz) IBW/kg (Calculated) : 77.6  Temp (24hrs), Avg:98.4 F (36.9 C), Min:97.5 F (36.4 C), Max:99.1 F (37.3 C)  Recent Labs  Lab 09/25/19 0435 09/26/19 0940 09/27/19 0653 09/27/19 0653 09/29/2019 0237 09/29/19 0349 09/30/19 0342 09/30/19 0757 10/01/19 0317  WBC 13.6*   < > 9.6  --  9.5 8.7 11.0*  --  13.2*  CREATININE 1.11   < > 1.20   < > 0.94 1.06 0.94 0.88 1.01  LATICACIDVEN 2.6*  --   --   --   --   --   --   --   --    < > = values in this interval not displayed.    Estimated Creatinine Clearance: 44.1 mL/min (by C-G formula based on SCr of 1.01 mg/dL).    No Known Allergies  Antimicrobials this admission: CTX 3/28 >> 4/3 Unasyn 4/4 >>   Dose adjustments this admission: N/A  Microbiology results: 3/28 BCx: negative 3/28 UCx: Ecoli  3/28 MRSA PCR: negative  Richardine Service, PharmD PGY1 Pharmacy Resident Phone: [phone] 10/01/2019  9:05 AM  Please check [email or web address] for unit-specific pharmacy phone numbers.

## 2019-10-01 NOTE — Progress Notes (Signed)
PROGRESS NOTE  Cameron [name] [id number] DOB: Jul 12, 1925 DOA: 09/06/2019 PCP: Lavone Orn, MD  HPI/Recap of past 24 hours: HPI from PCCM 86 year old man with history of HFpEF, COPD, atrial fibrillation on Xarelto, CAD, pulmonary fibrosis, GERD, hypothyroidism, recent hospitalization from 3/10-3/18 for facial cellulitis and hyponatremia presenting again with acute encephalopathy.  He has been having worse lethargy over the past few days.  There have been some reported falls at the facility.  Upon arrival to [facility] ED, he was found to have a UTI and started on antibiotics.  On evening of 3/28 he was found to be in pulseless Vtach.  CPR was initiated.  He was defibrillated x1.  ROSC was achieved after 4 minutes.  He had agonal breathing and was intubated extubated on 09/26/2019. Was previously on Lexapro, however given increase in Qtc >500 and Vtach, Lexapro was discontinued. Repeat EKG revealed Qtc of ~480s. Home Donepezil restarted on 3/29 and am QTc was 498. Held home Donepezil on 3/30 and 3/31 am QTc was 440.  Patient was subsequently stabilized.  [facility] hospitalist assumed care on 09/27/2019.     Today, patient noted to be more lethargic, sleepy, unable to engage in meaningful conversation, still tachypneic, tachycardic, mildly short of breath.      Assessment/Plan: Principal Problem:   Cardiac arrest [facility]) Active Problems:   Persistent atrial fibrillation (HCC)   Chronic diastolic HF (heart failure) (HCC)   Dementia (HCC)   Hyponatremia   Acute metabolic encephalopathy   Leukocytosis   Hypothyroidism   UTI (urinary tract infection)  Cardiac arrest/torsades Noted to have V. tach with defibrillation, prolonged QTC Intubated on 3/28, extubated on 3/30 Cardiology on board, appreciate recs Discontinue recently started Lexapro as well as home Aricept Continue to monitor electrolytes closely  NSTEMI complicated by polymorphic ventricular tachycardia/history of CAD/ischemic  cardiomyopathy Echo done on 09/25/2019 showed EF of 35 to 40%, with regional wall motion abnormalities Cardiology on board, status post LHC on 10/08/2019 which showed 99% proximal dominant circumflex status post PCI stent Continue aspirin, Eliquis, Plavix for 2 weeks then stop aspirin.  Combination of Eliquis and Plavix should be for 12 months Switch to IV heparin for now due to n.p.o. status Continue Crestor, IV metoprolol, losartan held  Acute metabolic encephalopathy/delirium History of underlying dementia, with current delirium ??  HCAP/aspiration pneumonia, intermittent tachypnea, hypoxia Remains afebrile, with rising leukocytosis UA unremarkable (recently completed treatment for UTI) Chest x-ray showed possible interval development of mild pulmonary edema with background scarring/fibrosis.  Superimposed infection or aspiration could be considered, repeat chest x-ray showed bilateral pneumonia Initially started on Unasyn on 10/01/2019, but switched to cefepime and metronidazole for broader coverage SLP consulted for swallow evaluation Continue to monitor closely  Acute hypoxic respiratory failure Likely 2/2 HCAP/aspiration pneumonia Intubated on 3/28, extubated on 3/30 Chest x-ray as above Continue cefepime, metronidazole Give 1 dose of Lasix on 10/01/2019 Continue pulmonary hygiene Supplemental oxygen prn Duo nebs as needed  Acute infarct/CVA-likely embolic CT head unremarkable MRI showed- 4 punctate infarcts at right MCA territory Likely related to cardiac cath procedure or A. fib Neurology on board, continue aspirin, clopidogrel, IV Heparin for now  UTI Currently afebrile, with no leukocytosis Blood culture x2 no growth to date UC grew E. Coli, sensitive to ceftriaxone Completed 7 days of ceftriaxone on 4/2  Paroxysmal A. Fib Switch to metoprolol and Eliquis Plan to discontinue home diltiazem and Xarelto  Normocytic chronic anemia Hemoglobin baseline around 12 No obvious  signs of bleeding Daily CBC  History of COPD Continue albuterol as needed, supplemental oxygen  Hypothyroidism Continue home Synthroid  BPH Continue home Flomax  Dementia Plan to discontinue Aricept due to noted prolonged QTC, V. tach and cardiac arrest  GOC discussion Patient with very poor prognosis, advanced age, history of dementia, DNR status Palliative care consulted for further goals of care discussion          Malnutrition Type:  Nutrition Problem: Severe Malnutrition Etiology: chronic illness(COPD)   Malnutrition Characteristics:  Signs/Symptoms: severe fat depletion, severe muscle depletion, energy intake < or equal to 75% for > or equal to 1 month   Nutrition Interventions:  Interventions: Tube feeding    Estimated body mass index is 19.58 kg/m as calculated from the following:   Height as of this encounter: 6' (1.829 m).   Weight as of this encounter: 65.5 kg.     Code Status: DNR  Family Communication: Discussed with daughter at bedside on 09/30/2019  Disposition Plan: Patient came from SNF, plan to DC to SNF as per recommendations from PT/OT.  TOC notified.  Cardiology clearance pending.  Patient currently with very poor prognosis, palliative consult pending for evaluation of hospice   Consultants:  Cardiology  PCCM  Neurology  Procedures:  Mechanical ventilation  Antimicrobials:  Cefepime  Metronidazole  DVT prophylaxis: Eliquis--> IV Heparin   Objective: Vitals:   10/01/19 1000 10/01/19 1330 10/01/19 1400 10/01/19 1509  BP: 126/76 (!) 104/56 (!) 95/54   Pulse: (!) 114 (!) 106 (!) 104   Resp: (!) 27 (!) 36 (!) 34   Temp:  98.2 F (36.8 C)    TempSrc:  Oral    SpO2: 98% 98% 100% 100%  Weight:      Height:        Intake/Output Summary (Last 24 hours) at 10/01/2019 1540 Last data filed at 09/30/2019 2357 [place] per 24 hour  Intake 0 ml  Output --  Net 0 ml   Filed Weights   09/27/19 0300 09/27/19 1912 09/30/2019  1955  Weight: 64.3 kg 65.3 kg 65.5 kg    Exam:  General: NAD, chronically ill-appearing, oriented to self only, lethargic  Cardiovascular: S1, S2 present  Respiratory:  Mild bibasilar crackles noted  Abdomen: Soft, nontender, nondistended, bowel sounds present  Musculoskeletal: No bilateral pedal edema noted  Skin: Normal  Psychiatry:  Unable to assess  Neurology: Unable to follow commands appropriately   Data Reviewed: CBC: Recent Labs  Lab 09/25/19 0435 09/26/19 0940 09/27/19 0653 10/22/2019 0237 09/29/19 0349 09/30/19 0342 10/01/19 0317  WBC 13.6*   < > 9.6 9.5 8.7 11.0* 13.2*  NEUTROABS 11.1*  --   --  7.3 6.8 8.6* 10.8*  HGB 13.3   < > 10.7* 11.5* 12.1* 13.3 11.6*  HCT 39.7   < > 31.8* 33.1* 36.3* 41.2 35.5*  MCV 101.0*   < > 101.0* 99.1 101.1* 103.8* 103.2*  PLT 217   < > 192 195 243 247 245   < > = values in this interval not displayed.   Basic Metabolic Panel: Recent Labs  Lab 09/24/19 2103 09/24/19 2138 09/25/19 0435 09/26/19 0940 09/27/19 0653 09/27/19 0653 10/20/2019 0237 09/29/19 0349 09/30/19 0342 09/30/19 0757 10/01/19 0317  NA 133*   < > 135   < > 136   < > 137 139 143 140 143  K 3.7   < > 4.6   < > 3.8   < > 4.3 3.5 6.0* 4.9 4.9  CL 103   < > 102   < >  103   < > 105 106 110 108 109  CO2 18*   < > 21*   < > 21*   < > 22 21* 20* 19* 23  GLUCOSE 130*   < > 124*   < > 88   < > 111* 113* 132* 128* 141*  BUN 19   < > 20   < > 24*   < > 23 16 18 17 21   CREATININE 1.26*   < > 1.11   < > 1.20   < > 0.94 1.06 0.94 0.88 1.01  CALCIUM 8.2*   < > 8.6*   < > 8.3*   < > 8.2* 8.3* 8.8* 8.5* 8.5*  MG 1.9  --  2.4  --  2.3  --   --   --   --   --   --   PHOS  --   --  3.7  --   --   --   --   --   --   --   --    < > = values in this interval not displayed.   GFR: Estimated Creatinine Clearance: 44.1 mL/min (by C-G formula based on SCr of 1.01 mg/dL). Liver Function Tests: Recent Labs  Lab 09/24/19 2103 09/25/19 0435  AST 123* 93*  ALT 74* 68*   ALKPHOS 47 49  BILITOT 1.2 0.9  PROT 5.8* 6.5  ALBUMIN 2.3* 2.6*   No results for input(s): LIPASE, AMYLASE in the last 168 hours. Recent Labs  Lab 10/01/19 0317  AMMONIA 13   Coagulation Profile: No results for input(s): INR, PROTIME in the last 168 hours. Cardiac Enzymes: No results for input(s): CKTOTAL, CKMB, CKMBINDEX, TROPONINI in the last 168 hours. BNP (last 3 results) No results for input(s): PROBNP in the last 8760 hours. HbA1C: Recent Labs    10/01/19 0317  HGBA1C 5.7*   CBG: Recent Labs  Lab 09/24/19 2041 09/24/19 2136  GLUCAP 132* 124*   Lipid Profile: Recent Labs    09/29/19 0349  CHOL 110  HDL 21*  LDLCALC 76  TRIG 65  CHOLHDL 5.2   Thyroid Function Tests: No results for input(s): TSH, T4TOTAL, FREET4, T3FREE, THYROIDAB in the last 72 hours. Anemia Panel: No results for input(s): VITAMINB12, FOLATE, FERRITIN, TIBC, IRON, RETICCTPCT in the last 72 hours. Urine analysis:    Component Value Date/Time   COLORURINE YELLOW 09/30/2019 0449   APPEARANCEUR HAZY (A) 09/30/2019 0449   LABSPEC 1.029 09/30/2019 0449   PHURINE 5.0 09/30/2019 0449   GLUCOSEU NEGATIVE 09/30/2019 0449   HGBUR SMALL (A) 09/30/2019 0449   BILIRUBINUR NEGATIVE 09/30/2019 0449   KETONESUR NEGATIVE 09/30/2019 0449   PROTEINUR 100 (A) 09/30/2019 0449   UROBILINOGEN 0.2 07/12/2013 2230   NITRITE NEGATIVE 09/30/2019 0449   LEUKOCYTESUR NEGATIVE 09/30/2019 0449   Sepsis Labs: @LABRCNTIP (procalcitonin:4,lacticidven:4)  ) Recent Results (from the past 240 hour(s))  Urine culture     Status: Abnormal   Collection Time: 09/24/19  2:11 AM   Specimen: Urine, Catheterized  Result Value Ref Range Status   Specimen Description URINE, CATHETERIZED  Final   Special Requests   Final    NONE Performed at [facility], [address] [place], [place] [address]    Culture >=100,000 COLONIES/mL ESCHERICHIA COLI (A)  Final   Report Status 09/26/2019 FINAL  Final   Organism ID,  Bacteria ESCHERICHIA COLI (A)  Final      Susceptibility   Escherichia coli - MIC*  AMPICILLIN >=32 RESISTANT Resistant     CEFAZOLIN >=64 RESISTANT Resistant     CEFTRIAXONE 0.5 SENSITIVE Sensitive     CIPROFLOXACIN >=4 RESISTANT Resistant     GENTAMICIN <=1 SENSITIVE Sensitive     IMIPENEM <=0.25 SENSITIVE Sensitive     NITROFURANTOIN <=16 SENSITIVE Sensitive     TRIMETH/SULFA <=20 SENSITIVE Sensitive     AMPICILLIN/SULBACTAM >=32 RESISTANT Resistant     PIP/TAZO >=128 RESISTANT Resistant     * >=100,000 COLONIES/mL ESCHERICHIA COLI  Blood culture (routine x 2)     Status: None   Collection Time: 09/24/19  3:50 AM   Specimen: BLOOD  Result Value Ref Range Status   Specimen Description BLOOD RIGHT ARM  Final   Special Requests   Final    BOTTLES DRAWN AEROBIC AND ANAEROBIC Blood Culture adequate volume   Culture   Final    NO GROWTH 5 DAYS Performed at [facility], [place] [address] [place], [place] [address]    Report Status 09/29/2019 FINAL  Final  Blood culture (routine x 2)     Status: None   Collection Time: 09/24/19  3:50 AM   Specimen: BLOOD  Result Value Ref Range Status   Specimen Description BLOOD LEFT ARM  Final   Special Requests   Final    BOTTLES DRAWN AEROBIC AND ANAEROBIC Blood Culture adequate volume   Culture   Final    NO GROWTH 5 DAYS Performed at [facility], [place] [address] [place], [place] [address]    Report Status 09/29/2019 FINAL  Final  SARS CORONAVIRUS 2 (TAT 6-24 HRS) Nasopharyngeal Nasopharyngeal Swab     Status: None   Collection Time: 09/24/19  4:27 AM   Specimen: Nasopharyngeal Swab  Result Value Ref Range Status   SARS Coronavirus 2 NEGATIVE NEGATIVE Final    Comment: (NOTE) SARS-CoV-2 target nucleic acids are NOT DETECTED. The SARS-CoV-2 RNA is generally detectable in upper and lower respiratory specimens during the acute phase of infection. Negative results do not preclude SARS-CoV-2 infection, do not rule  out co-infections with other pathogens, and should not be used as the sole basis for treatment or other patient management decisions. Negative results must be combined with clinical observations, patient history, and epidemiological information. The expected result is Negative. Fact Sheet for Patients: SugarRoll.be Fact Sheet for Healthcare Providers: https://www.woods-mathews.com/ This test is not yet approved or cleared by the [place] FDA and  has been authorized for detection and/or diagnosis of SARS-CoV-2 by FDA under an Emergency Use Authorization (EUA). This EUA will remain  in effect (meaning this test can be used) for the duration of the COVID-19 declaration under Section 56 4(b)(1) of the Act, 21 U.S.C. section 360bbb-3(b)(1), unless the authorization is terminated or revoked sooner. Performed at [facility], [place] [address], [place], [place] [address]   MRSA PCR Screening     Status: None   Collection Time: 09/24/19  9:30 PM   Specimen: Nasopharyngeal  Result Value Ref Range Status   MRSA by PCR NEGATIVE NEGATIVE Final    Comment:        The GeneXpert MRSA Assay (FDA approved for NASAL specimens only), is one component of a comprehensive MRSA colonization surveillance program. It is not intended to diagnose MRSA infection nor to guide or monitor treatment for MRSA infections. Performed at [facility], [place] [address] [place], [place] [address]       Studies: CT ANGIO HEAD W OR WO  CONTRAST  Result Date: 10/01/2019 CLINICAL DATA:  Stroke follow-up EXAM: CT ANGIOGRAPHY HEAD AND NECK TECHNIQUE: Multidetector CT imaging of the head and neck was performed using the standard protocol during bolus administration of intravenous contrast. Multiplanar CT image reconstructions and MIPs were obtained to evaluate the vascular anatomy. Carotid stenosis measurements (when applicable) are obtained utilizing NASCET criteria,  using the distal internal carotid diameter as the denominator. CONTRAST:  25mL OMNIPAQUE IOHEXOL 350 MG/ML SOLN COMPARISON:  Brain MRI 09/30/2019 FINDINGS: CT HEAD FINDINGS Brain: There is no mass, hemorrhage or extra-axial collection. The size and configuration of the ventricles and extra-axial CSF spaces are normal. There is hypoattenuation of the periventricular white matter, most commonly indicating chronic ischemic microangiopathy. Skull: The visualized skull base, calvarium and extracranial soft tissues are normal. Sinuses/Orbits: No fluid levels or advanced mucosal thickening of the visualized paranasal sinuses. No mastoid or middle ear effusion. The orbits are normal. CTA NECK FINDINGS SKELETON: There is no bony spinal canal stenosis. No lytic or blastic lesion. OTHER NECK: Normal pharynx, larynx and major salivary glands. No cervical lymphadenopathy. Unremarkable thyroid gland. UPPER CHEST: Severe bilateral interstitial lung disease with small pleural effusions. AORTIC ARCH: There is mild calcific atherosclerosis of the aortic arch. There is no aneurysm, dissection or hemodynamically significant stenosis of the visualized portion of the aorta. Conventional 3 vessel aortic branching pattern. The visualized proximal subclavian arteries are widely patent. RIGHT CAROTID SYSTEM: No dissection, occlusion or aneurysm. Mild atherosclerotic calcification at the carotid bifurcation without hemodynamically significant stenosis. LEFT CAROTID SYSTEM: No dissection, occlusion or aneurysm. There is predominantly calcified atherosclerosis extending into the proximal ICA, resulting in 50% stenosis. VERTEBRAL ARTERIES: Left dominant configuration. Both origins are clearly patent. There is no dissection, occlusion or flow-limiting stenosis to the skull base (V1-V3 segments). CTA HEAD FINDINGS POSTERIOR CIRCULATION: --Vertebral arteries: Normal V4 segments. --Posterior inferior cerebellar arteries (PICA): Patent origins from  the vertebral arteries. --Anterior inferior cerebellar arteries (AICA): Patent origins from the basilar artery. --Basilar artery: Normal. --Superior cerebellar arteries: Normal. --Posterior cerebral arteries: Normal. Both originate from the basilar artery. Posterior communicating arteries (p-comm) are diminutive or absent. ANTERIOR CIRCULATION: --Intracranial internal carotid arteries: Atherosclerotic calcification of the internal carotid arteries at the skull base without hemodynamically significant stenosis. --Anterior cerebral arteries (ACA): Normal. Both A1 segments are present. Patent anterior communicating artery (a-comm). --Middle cerebral arteries (MCA): Normal. VENOUS SINUSES: As permitted by contrast timing, patent. ANATOMIC VARIANTS: None Review of the MIP images confirms the above findings. IMPRESSION: 1. No emergent large vessel occlusion. 2. 50% stenosis of the proximal left ICA secondary to predominantly calcified atherosclerosis. 3. Severe interstitial lung disease with small pleural effusions. Electronically Signed   By: Ulyses Jarred M.D.   On: 10/01/2019 04:17   CT ANGIO NECK W OR WO CONTRAST  Result Date: 10/01/2019 CLINICAL DATA:  Stroke follow-up EXAM: CT ANGIOGRAPHY HEAD AND NECK TECHNIQUE: Multidetector CT imaging of the head and neck was performed using the standard protocol during bolus administration of intravenous contrast. Multiplanar CT image reconstructions and MIPs were obtained to evaluate the vascular anatomy. Carotid stenosis measurements (when applicable) are obtained utilizing NASCET criteria, using the distal internal carotid diameter as the denominator. CONTRAST:  104mL OMNIPAQUE IOHEXOL 350 MG/ML SOLN COMPARISON:  Brain MRI 09/30/2019 FINDINGS: CT HEAD FINDINGS Brain: There is no mass, hemorrhage or extra-axial collection. The size and configuration of the ventricles and extra-axial CSF spaces are normal. There is hypoattenuation of the periventricular white matter, most  commonly indicating chronic ischemic microangiopathy. Skull:  The visualized skull base, calvarium and extracranial soft tissues are normal. Sinuses/Orbits: No fluid levels or advanced mucosal thickening of the visualized paranasal sinuses. No mastoid or middle ear effusion. The orbits are normal. CTA NECK FINDINGS SKELETON: There is no bony spinal canal stenosis. No lytic or blastic lesion. OTHER NECK: Normal pharynx, larynx and major salivary glands. No cervical lymphadenopathy. Unremarkable thyroid gland. UPPER CHEST: Severe bilateral interstitial lung disease with small pleural effusions. AORTIC ARCH: There is mild calcific atherosclerosis of the aortic arch. There is no aneurysm, dissection or hemodynamically significant stenosis of the visualized portion of the aorta. Conventional 3 vessel aortic branching pattern. The visualized proximal subclavian arteries are widely patent. RIGHT CAROTID SYSTEM: No dissection, occlusion or aneurysm. Mild atherosclerotic calcification at the carotid bifurcation without hemodynamically significant stenosis. LEFT CAROTID SYSTEM: No dissection, occlusion or aneurysm. There is predominantly calcified atherosclerosis extending into the proximal ICA, resulting in 50% stenosis. VERTEBRAL ARTERIES: Left dominant configuration. Both origins are clearly patent. There is no dissection, occlusion or flow-limiting stenosis to the skull base (V1-V3 segments). CTA HEAD FINDINGS POSTERIOR CIRCULATION: --Vertebral arteries: Normal V4 segments. --Posterior inferior cerebellar arteries (PICA): Patent origins from the vertebral arteries. --Anterior inferior cerebellar arteries (AICA): Patent origins from the basilar artery. --Basilar artery: Normal. --Superior cerebellar arteries: Normal. --Posterior cerebral arteries: Normal. Both originate from the basilar artery. Posterior communicating arteries (p-comm) are diminutive or absent. ANTERIOR CIRCULATION: --Intracranial internal carotid arteries:  Atherosclerotic calcification of the internal carotid arteries at the skull base without hemodynamically significant stenosis. --Anterior cerebral arteries (ACA): Normal. Both A1 segments are present. Patent anterior communicating artery (a-comm). --Middle cerebral arteries (MCA): Normal. VENOUS SINUSES: As permitted by contrast timing, patent. ANATOMIC VARIANTS: None Review of the MIP images confirms the above findings. IMPRESSION: 1. No emergent large vessel occlusion. 2. 50% stenosis of the proximal left ICA secondary to predominantly calcified atherosclerosis. 3. Severe interstitial lung disease with small pleural effusions. Electronically Signed   By: Ulyses Jarred M.D.   On: 10/01/2019 04:17   MR BRAIN WO CONTRAST  Result Date: 09/30/2019 CLINICAL DATA:  Encephalopathy EXAM: MRI HEAD WITHOUT CONTRAST TECHNIQUE: Multiplanar, multiecho pulse sequences of the brain and surrounding structures were obtained without intravenous contrast. COMPARISON:  2018 FINDINGS: Brain: Few scattered punctate foci of reduced diffusion in the right frontoparietal white matter as well as the right lentiform nucleus. There is no evidence of intracranial hemorrhage. Patchy and confluent T2 hyperintensity in the supratentorial much greater than pontine white matter is nonspecific but probably reflects moderate chronic microvascular ischemic changes. Prominence of the ventricles and sulci reflects generalized parenchymal volume loss. There is no intracranial mass, mass effect, or edema. There is no hydrocephalus or extra-axial fluid collection. Vascular: Diminished proximal right intracranial vertebral artery flow void also present on the prior study. Major vessel flow voids at the skull base are otherwise preserved. Skull and upper cervical spine: Normal marrow signal is preserved. Sinuses/Orbits: Trace mucosal thickening. Bilateral lens replacements. Other: Sella is partially empty.  Mastoid air cells are clear. IMPRESSION: Few  (less than 10) punctate acute infarcts in the right MCA territory. Moderate chronic microvascular ischemic changes. Electronically Signed   By: Macy Mis M.D.   On: 09/30/2019 18:53   DG CHEST PORT 1 VIEW  Result Date: 10/01/2019 CLINICAL DATA:  Tachypnea and constipation EXAM: PORTABLE CHEST 1 VIEW COMPARISON:  09/24/2019 FINDINGS: Bilateral airspace disease which is extensive. Opacity is mildly increased on the left. Chronic cardiomegaly with stable aortic contours. No visible effusion or pneumothorax.  IMPRESSION: Bilateral pneumonia with mild progression on the left since prior Electronically Signed   By: Monte Fantasia M.D.   On: 10/01/2019 09:01   DG Abd Portable 1V  Result Date: 10/01/2019 CLINICAL DATA:  Shortness of breath EXAM: PORTABLE ABDOMEN - 1 VIEW COMPARISON:  09/24/2019 FINDINGS: Normal bowel gas pattern. Extensive high-density in the bladder from recent CTA head neck. This shows prominent trabeculation of the bladder with cellules or diverticula. Pulmonary findings as described on dedicated chest x-ray .  Cholelithiasis IMPRESSION: 1. Normal bowel gas pattern. 2. Findings of chronic urinary outlet obstruction. The bladder is not over distended currently. 3. Cholelithiasis. Electronically Signed   By: Monte Fantasia M.D.   On: 10/01/2019 09:03   DG Swallowing Func-Speech Pathology  Result Date: 10/01/2019 Objective Swallowing Evaluation: Type of Study: MBS-Modified Barium Swallow Study  Patient Details Name: Cameron [name] MRN: [id number] Date of Birth: [birth year] Today's Date: 10/01/2019 Time: SLP Start Time (ACUTE ONLY): 1237 -SLP Stop Time (ACUTE ONLY): 1250 SLP Time Calculation (min) (ACUTE ONLY): 13 min Past Medical History: Past Medical History: Diagnosis Date . Arthritis  . CHF (congestive heart failure) ([place])  . COPD (chronic obstructive pulmonary disease) ([place])  . Dysrhythmia   atrial fibrilation . GERD (gastroesophageal reflux disease)  . Pulmonary fibrosis ([place])  Past  Surgical History: Past Surgical History: Procedure Laterality Date . CORONARY STENT INTERVENTION N/A 10/18/2019  Procedure: CORONARY STENT INTERVENTION;  Surgeon: Belva Crome, MD;  Location: [facility];  Service: Cardiovascular;  Laterality: N/A; . hemmroidectmy   . HERNIA REPAIR   . LEFT HEART CATH AND CORONARY ANGIOGRAPHY N/A 10/24/2019  Procedure: LEFT HEART CATH AND CORONARY ANGIOGRAPHY;  Surgeon: Belva Crome, MD;  Location: [facility];  Service: Cardiovascular;  Laterality: N/A; . LEFT HEART CATHETERIZATION WITH CORONARY ANGIOGRAM N/A 10/07/2012  Procedure: LEFT HEART CATHETERIZATION WITH CORONARY ANGIOGRAM, possible PCI;  Surgeon: Jettie Booze, MD;  Location: [facility];  Service: Cardiovascular;  Laterality: N/A; . TONSILLECTOMY   HPI: Pt is a 86 year old male with past medical history of GERD, COPD, chronic Afib, hiatal hernia, and diastolic CHF  who presented to the ED after exhibiting a several day history of progressively worsening lethargy and confusion diagnosed with acute encephalopathy and found to have UTI. Vtach arrest on 3/28; CPR 4 mins; ETT 3/28-3/30 at 11:20. MRI brain 4/3: Few (less than 10) punctate acute infarcts in the right MCA territory.  No data recorded Assessment / Plan / Recommendation CHL IP CLINICAL IMPRESSIONS 10/01/2019 Clinical Impression Pt presents with moderate oropharyngeal dysphagia characterized by prolonged mastication, impaired bolus cohesion, a pharyngeal delay, reduced lingual retraction, and reduced anterior laryngeal movement. He demonstrated premature spillage to the level of the valleculae and pyriform sinuses, lngual residue, vallecular residue, pyriform sinus residue, intermittent incomplete epiglottic retroversion, and aspiration (PAS 7,8) of thin liquids. Coughing was ineffective in expelling aspirate from the larynx. Pt was unable to effectively and consistently demonstrate postural modifications despite models or verbal and tactile cues.  A dysphagia 1 (puree) diet with nectar thick liquids is recommended at this time. SLP will follow for dysphagia tx as clinically indicated pending decision regarding goals of care.  SLP Visit Diagnosis Dysphagia, oropharyngeal phase (R13.12) Attention and concentration deficit following -- Frontal lobe and executive function deficit following -- Impact on safety and function Mild aspiration risk;Moderate aspiration risk   CHL IP TREATMENT RECOMMENDATION 10/01/2019 Treatment Recommendations Therapy as outlined in treatment plan below   Prognosis 10/01/2019 Prognosis for  Safe Diet Advancement Fair Barriers to Reach Goals Cognitive deficits;Severity of deficits Barriers/Prognosis Comment -- CHL IP DIET RECOMMENDATION 10/01/2019 SLP Diet Recommendations Dysphagia 1 (Puree) solids;Nectar thick liquid Liquid Administration via Straw;Cup Medication Administration Crushed with puree Compensations Slow rate;Small sips/bites;Follow solids with liquid Postural Changes Seated upright at 90 degrees;Remain semi-upright after after feeds/meals (Comment)   No flowsheet data found.  CHL IP FOLLOW UP RECOMMENDATIONS 10/01/2019 Follow up Recommendations Other (comment)   CHL IP FREQUENCY AND DURATION 10/01/2019 Speech Therapy Frequency (ACUTE ONLY) min 2x/week Treatment Duration 2 weeks      CHL IP ORAL PHASE 10/01/2019 Oral Phase Impaired Oral - Pudding Teaspoon -- Oral - Pudding Cup -- Oral - Honey Teaspoon -- Oral - Honey Cup -- Oral - Nectar Teaspoon -- Oral - Nectar Cup -- Oral - Nectar Straw Premature spillage;Decreased bolus cohesion Oral - Thin Teaspoon -- Oral - Thin Cup -- Oral - Thin Straw Premature spillage;Decreased bolus cohesion Oral - Puree Premature spillage;Decreased bolus cohesion;Lingual/palatal residue Oral - Mech Soft -- Oral - Regular Decreased bolus cohesion;Lingual/palatal residue;Impaired mastication Oral - Multi-Consistency -- Oral - Pill -- Oral Phase - Comment --  CHL IP PHARYNGEAL PHASE 10/01/2019 Pharyngeal Phase  Impaired Pharyngeal- Pudding Teaspoon -- Pharyngeal -- Pharyngeal- Pudding Cup -- Pharyngeal -- Pharyngeal- Honey Teaspoon -- Pharyngeal -- Pharyngeal- Honey Cup -- Pharyngeal -- Pharyngeal- Nectar Teaspoon -- Pharyngeal -- Pharyngeal- Nectar Cup -- Pharyngeal -- Pharyngeal- Nectar Straw Delayed swallow initiation-vallecula;Delayed swallow initiation-pyriform sinuses;Pharyngeal residue - pyriform;Pharyngeal residue - valleculae;Reduced tongue base retraction;Reduced anterior laryngeal mobility Pharyngeal -- Pharyngeal- Thin Teaspoon -- Pharyngeal -- Pharyngeal- Thin Cup -- Pharyngeal -- Pharyngeal- Thin Straw Delayed swallow initiation-vallecula;Delayed swallow initiation-pyriform sinuses;Pharyngeal residue - pyriform;Pharyngeal residue - valleculae;Reduced tongue base retraction;Reduced anterior laryngeal mobility;Penetration/Aspiration during swallow Pharyngeal Material enters airway, passes BELOW cords and not ejected out despite cough attempt by patient;Material enters airway, passes BELOW cords without attempt by patient to eject out (silent aspiration) Pharyngeal- Puree Pharyngeal residue - pyriform;Pharyngeal residue - valleculae;Reduced tongue base retraction;Reduced anterior laryngeal mobility;Delayed swallow initiation-vallecula Pharyngeal -- Pharyngeal- Mechanical Soft -- Pharyngeal -- Pharyngeal- Regular Delayed swallow initiation-vallecula;Pharyngeal residue - pyriform;Pharyngeal residue - valleculae;Reduced tongue base retraction;Reduced anterior laryngeal mobility Pharyngeal -- Pharyngeal- Multi-consistency -- Pharyngeal -- Pharyngeal- Pill -- Pharyngeal -- Pharyngeal Comment --  CHL IP CERVICAL ESOPHAGEAL PHASE 10/01/2019 Cervical Esophageal Phase WFL Pudding Teaspoon -- Pudding Cup -- Honey Teaspoon -- Honey Cup -- Nectar Teaspoon -- Nectar Cup -- Nectar Straw -- Thin Teaspoon -- Thin Cup -- Thin Straw -- Puree -- Mechanical Soft -- Regular -- Multi-consistency -- Pill -- Cervical Esophageal Comment  -- Shanika I. [name], [place], [place] Office number [phone] Pager [place] 10/01/2019, 2:31 PM               Scheduled Meds: .  stroke: mapping our early stages of recovery book   Does not apply Once  . aspirin  81 mg Oral Daily  . Chlorhexidine Gluconate Cloth  6 each Topical Daily  . clopidogrel  75 mg Oral Q breakfast  . erythromycin  1 application Left Eye QID  . feeding supplement (ENSURE ENLIVE)  237 mL Oral TID BM  . levalbuterol  0.63 mg Nebulization Q8H  . levothyroxine  75 mcg Per Tube Q0600  . metoprolol tartrate  2.5 mg Intravenous Q6H  . pantoprazole  40 mg Oral Daily  . rosuvastatin  20 mg Oral Daily  . sodium chloride flush  3 mL Intravenous Q12H  . tamsulosin  0.4 mg Oral Q supper  . vitamin B-12  500 mcg Oral Once per day on Mon Thu    Continuous Infusions: . sodium chloride    . heparin 1,100 Units/hr (10/01/19 1151)  . metronidazole       LOS: 7 days     Alma Friendly, MD [facility]  If 7PM-7AM, please contact night-coverage [email or web address] 10/01/2019, 3:40 PM

## 2019-10-01 NOTE — Progress Notes (Addendum)
STROKE TEAM PROGRESS NOTE   INTERVAL HISTORY No family is at the bedside.  Pt lying in bed, very lethargic, drowsy but arousable, very soft voice with mild SOB, not able to finish sentence in one breath. Tele showed afib RVR with HR at 110s. CXR showed bilateral pneumonia developed. MRI showed 4 punctate infarcts at right MCA territory, either related to cath procedure or from afib but not able to explain his mental status change which most likely due to metabolic encephalopathy.   OBJECTIVE Vitals:   09/30/19 2019 09/30/19 2330 10/01/19 0404 10/01/19 0532  BP: 115/72 123/74 118/72   Pulse: (!) 41 (!) 103 (!) 105 100  Resp: (!) 25 (!) 23 (!) 32 (!) 32  Temp: 98.6 F (37 C) 99.1 F (37.3 C) (!) 97.5 F (36.4 C)   TempSrc: Oral Oral Oral   SpO2: 99% 90% 94% 91%  Weight:      Height:        CBC:  Recent Labs  Lab 09/30/19 0342 10/01/19 0317  WBC 11.0* 13.2*  NEUTROABS 8.6* 10.8*  HGB 13.3 11.6*  HCT 41.2 35.5*  MCV 103.8* 103.2*  PLT 247 [id number]    Basic Metabolic Panel:  Recent Labs  Lab 09/25/19 0435 09/26/19 0940 09/27/19 0653 10/23/2019 0237 09/30/19 0757 10/01/19 0317  NA 135   < > 136   < > 140 143  K 4.6   < > 3.8   < > 4.9 4.9  CL 102   < > 103   < > 108 109  CO2 21*   < > 21*   < > 19* 23  GLUCOSE 124*   < > 88   < > 128* 141*  BUN 20   < > 24*   < > 17 21  CREATININE 1.11   < > 1.20   < > 0.88 1.01  CALCIUM 8.6*   < > 8.3*   < > 8.5* 8.5*  MG 2.4  --  2.3  --   --   --   PHOS 3.7  --   --   --   --   --    < > = values in this interval not displayed.    Lipid Panel:     Component Value Date/Time   CHOL 110 09/29/2019 0349   TRIG 65 09/29/2019 0349   HDL 21 (L) 09/29/2019 0349   CHOLHDL 5.2 09/29/2019 0349   VLDL 13 09/29/2019 0349   LDLCALC 76 09/29/2019 0349   HgbA1c:  Lab Results  Component Value Date   HGBA1C 5.7 (H) 10/01/2019   Urine Drug Screen:     Component Value Date/Time   LABOPIA NONE DETECTED 09/06/2019 2149   COCAINSCRNUR NONE  DETECTED 09/06/2019 2149   LABBENZ NONE DETECTED 09/06/2019 2149   AMPHETMU NONE DETECTED 09/06/2019 2149   THCU NONE DETECTED 09/06/2019 2149   LABBARB NONE DETECTED 09/06/2019 2149    Alcohol Level No results found for: ETH  IMAGING  CT ANGIO HEAD W OR WO CONTRAST CT ANGIO NECK W OR WO CONTRAST 10/01/2019 IMPRESSION:  1. No emergent large vessel occlusion.  2. 50% stenosis of the proximal left ICA secondary to predominantly calcified atherosclerosis.  3. Severe interstitial lung disease with small pleural effusions.   CT HEAD WO CONTRAST 09/30/2019 IMPRESSION:  1. No acute findings. No intracranial mass, hemorrhage or edema.  2. Chronic small vessel ischemic changes in the white matter.   MR BRAIN WO CONTRAST 09/30/2019 IMPRESSION:  Few (  less than 10) punctate acute infarcts in the right MCA territory. Moderate chronic microvascular ischemic changes.   DG CHEST PORT 1 VIEW 09/29/2019 IMPRESSION:  Favor interval development of mild pulmonary edema on background scarring and fibrosis. Superimposed infection or aspiration could be considered in the appropriate clinical setting.    Transthoracic Echocardiogram  09/25/2019 IMPRESSIONS  1. Left ventricular ejection fraction, by estimation, is 35 to 40%. The  left ventricle has moderately decreased function. The left ventricle  demonstrates regional wall motion abnormalities (see scoring  diagram/findings for description).  Inferior/inferolateral akinesis. Left ventricular diastolic parameters are  indeterminate.  2. Right ventricular systolic function is mildly reduced. The right  ventricular size is mildly enlarged.  3. Right atrial size was severely dilated.  4. The mitral valve is normal in structure. Moderate mitral valve  regurgitation.  5. The tricuspid valve is abnormal. Tricuspid valve regurgitation is  moderate.  6. The aortic valve is tricuspid. Aortic valve regurgitation is mild to  moderate. Mild aortic valve  sclerosis is present, with no evidence of  aortic valve stenosis.    ECG - atrial fibrillation - ventricular response 99 BPM (See cardiology reading for complete details)   PHYSICAL EXAM  Temp:  [97.5 F (36.4 C)-99.1 F (37.3 C)] 98.2 F (36.8 C) (04/04 1330) Pulse Rate:  [41-120] 106 (04/04 1330) Resp:  [21-36] 36 (04/04 1330) BP: (104-131)/(56-76) 104/56 (04/04 1330) SpO2:  [90 %-99 %] 98 % (04/04 1330) FiO2 (%):  [28 %-36 %] 36 % (04/04 0915)  General - Well nourished, well developed, very lethargic and mild SOB.   Ophthalmologic - fundi not visualized due to noncooperation.  Cardiovascular - irregularly irregular heart rate and rhythm.  Neuro - very lethargic and eyes close. However, eyes able to open with voice, but needed repetitive stimulation to keep eyes open. Soft voice, hypophonia, but no aphasia, able to name and repeat and following simple commands. However, moderate dysarthria. Eyes mid position, able to have bilateral gaze, visual field full. No significant facial asymmetry, tongue midline. Moving all extremities symmetrically, BUE proximal 4/5 and distal 4/5, BLE proximal 3-/5 and 4+/5 distal foot DF/PF. Sensation symmetrical. FTN very slow but no ataxia bilaterally. Gait not tested.    ASSESSMENT/PLAN [name] is a 86 y.o. male with history of history of atrial fibrillation on Xarelto, mild dementia, CHF, COPD and pulmonary fibrosis presenting with UTI, AMS, V tach arrest, subsequent stent placement, inability to follow commands, MRI -> punctate infarcts R MCA territory.  He did not receive IV t-PA due to unknown time of onset.   Stroke, incidental finding:  4 punctate infarcts scattered at Rt MCA territory - embolic - likely related to cardiac cath procedure or atrial fibrillation even on AC  Resultant  lethargy  CT head - No acute findings. No intracranial mass, hemorrhage or edema. Chronic small vessel ischemic changes in the white matter.   MRI  head - 4 punctate acute infarcts in the right MCA territory. Moderate chronic microvascular ischemic changes.   CTA H&N - No emergent large vessel occlusion. 50% stenosis of the proximal left ICA secondary to predominantly calcified atherosclerosis.  2D Echo - EF 35 - 40%. No cardiac source of emboli identified.   Sars Corona Virus 2 - negative  LDL - 76  HgbA1c - 5.7  UDS - negative  VTE prophylaxis - heparin IV  Xarelto (rivaroxaban) daily prior to admission, now on aspirin 81 mg daily, clopidogrel 75 mg daily and heparin IV. Timing  to switch heparin IV to eliquis once appropriate as per primary team  Patient counseled to be compliant with his antithrombotic medications  Ongoing aggressive stroke risk factor management  Therapy recommendations:  pending  Disposition:  Pending  Metabolic encephalopathy  Could be multifactorial   Leukocytosis - WBC 11.0->13.2  CXR 4/4 - bilateral developing pneumonia  Now on unasyn  UTI on admission - finished rocephin course - repeat UA neg  Cardiomyopathy with EF 35-50%  afib RVR  CAD s/p stenting   Recent V-tach arrest with down time 4 min  Pulmonary fibrosis - ABG on admission OK  Treatment as per primary team  Afib RVR Cardiomyopathy   Currently still afib RVR  EF 35-40%  On metoprolol  On heparin IV  Timing to switch to eliquis as per primary team  CAD s/p stent V-tach arrest  Multi vessel CAD  VT down time 4 min  S/p stent  On DAPT   Hypertension  Home BP meds: Cardizem ; lasix  Current BP meds: metoprolol  Stable . Long-term BP goal normotensive  Hyperlipidemia  Home Lipid lowering medication: none  LDL 76, goal < 70  Current lipid lowering medication: Crestor 20 mg daily  Continue statin at discharge  Other Stroke Risk Factors  Advanced age  Former cigarette smoker - quit  ETOH use, advised to drink no more than 1 alcoholic beverage per day.  Other Active Problems  Code  status - DNR  Anemia of chronic disease - Hb - 13.3->11.6  Dementia at baseline   Hospital day # 7  Neurology will sign off. Please call with questions. Pt will follow up with stroke clinic NP at [facility] in about 4 weeks. Thanks for the consult.  Rosalin Hawking, MD PhD Stroke Neurology 10/01/2019 2:09 PM  I spent  35 minutes in total face-to-face time with the patient, more than 50% of which was spent in counseling and coordination of care, reviewing test results, images and medication, and discussing the diagnosis of encephalopathy, pneumonia, VT arrest, CAD s/p stent, stroke, CHF, afib RVR, treatment plan and potential prognosis. This patient's care requiresreview of multiple databases, neurological assessment, discussion with family, other specialists and medical decision making of high complexity.     To contact Stroke Continuity provider, please refer to [email or web address] After hours, contact General Neurology

## 2019-10-01 NOTE — Progress Notes (Signed)
Modified Barium Swallow Progress Note  Patient Details  Name: Cameron [name] MRN: [id number] Date of Birth: 02-09-1926  Today's Date: 10/01/2019  Modified Barium Swallow completed.  Full report located under Chart Review in the Imaging Section.  Brief recommendations include the following:  Clinical Impression  Pt presents with moderate oropharyngeal dysphagia characterized by prolonged mastication, impaired bolus cohesion, a pharyngeal delay, reduced lingual retraction, and reduced anterior laryngeal movement. He demonstrated premature spillage to the level of the valleculae and pyriform sinuses, lngual residue, vallecular residue, pyriform sinus residue, intermittent incomplete epiglottic retroversion, and aspiration (PAS 7,8) of thin liquids. Coughing was ineffective in expelling aspirate from the larynx. Pt was unable to effectively and consistently demonstrate postural modifications despite models or verbal and tactile cues. A dysphagia 1 (puree) diet with nectar thick liquids is recommended at this time. SLP will follow for dysphagia tx as clinically indicated pending decision regarding goals of care.    Swallow Evaluation Recommendations       SLP Diet Recommendations: Dysphagia 1 (Puree) solids;Nectar thick liquid   Liquid Administration via: Straw;Cup   Medication Administration: Crushed with puree   Supervision: Full assist for feeding;Full supervision/cueing for compensatory strategies   Compensations: Slow rate;Small sips/bites;Follow solids with liquid   Postural Changes: Seated upright at 90 degrees;Remain semi-upright after after feeds/meals (Comment)           Lavinia Mcneely I. [name], [place], [place] Office number [phone] Pager [place] 10/01/2019,2:28 PM

## 2019-10-01 NOTE — Progress Notes (Signed)
ANTICOAGULATION CONSULT NOTE  Pharmacy Consult for Heparin Indication: atrial fibrillation  No Known Allergies  Patient Measurements: Height: 6' (182.9 cm) Weight: 65.5 kg (144 lb 6.4 oz) IBW/kg (Calculated) : 77.6 Heparin Dosing Weight: 65.5 kg  Vital Signs: Temp: 98.2 F (36.8 C) (04/04 1330) Temp Source: Oral (04/04 1330) BP: 95/54 (04/04 1400) Pulse Rate: 104 (04/04 1400)  Labs: Recent Labs    09/29/19 0349 09/29/19 0349 09/30/19 0342 09/30/19 0757 10/01/19 0317 10/01/19 1717  HGB 12.1*   < > 13.3  --  11.6*  --   HCT 36.3*  --  41.2  --  35.5*  --   PLT 243  --  247  --  245  --   APTT  --   --   --   --   --  87*  CREATININE 1.06   < > 0.94 0.88 1.01  --    < > = values in this interval not displayed.    Estimated Creatinine Clearance: 44.1 mL/min (by C-G formula based on SCr of 1.01 mg/dL).   Medical History: Past Medical History:  Diagnosis Date  . Arthritis   . CHF (congestive heart failure) ([place])   . COPD (chronic obstructive pulmonary disease) ([place])   . Dysrhythmia    atrial fibrilation  . GERD (gastroesophageal reflux disease)   . Pulmonary fibrosis [facility])     Assessment: 86 yo M with history of atrial fibrillation now restarting heparin. Pt was on Xarelto PTA but was switched to Eliquis on 4/1 (last dose given 4/3 at 0945 per [facility]). He is s/p LHC on 4/1 with PCI stent placement to Cx when pt was started on triple therapy with ASA/plavix/Eliquis. Plans were to continue triple therapy for 2 weeks (through 4/14) then de-escalate by stopping aspirin.   Patient now s/p CVA found to have 4 infarcts in right MCA on 09/30/19. Neurology notes that risk of further emboli is greater than the risk of hemorrhagic transformation, therefore will keep on anticoagulation. Pharmacy asked to restart heparin. Will dose based off of aPTT levels until heparin level and aPTT levels correlate and utilize lower CVA goal for now. Initial aPTT is 87 seconds, heparin level altered  by DOAC use.   Goal of Therapy:  Heparin level 0.3-0.5 units/ml aPTT 66-85 seconds Monitor platelets by anticoagulation protocol: Yes   Plan:  -Reduce heparin slightly to 1050 units/h -Recheck aPTT with am labs   Arrie Senate, PharmD, BCPS Clinical Pharmacist [phone] Please check AMION for all [place] numbers 10/01/2019

## 2019-10-01 NOTE — Progress Notes (Addendum)
ANTICOAGULATION CONSULT NOTE - Initial Consult  Pharmacy Consult for Heparin Indication: atrial fibrillation  No Known Allergies  Patient Measurements: Height: 6' (182.9 cm) Weight: 65.5 kg (144 lb 6.4 oz) IBW/kg (Calculated) : 77.6 Heparin Dosing Weight: 65.5 kg  Vital Signs: Temp: 97.7 F (36.5 C) (04/04 0803) Temp Source: Oral (04/04 0803) BP: 131/76 (04/04 0803) Pulse Rate: 120 (04/04 0803)  Labs: Recent Labs    09/29/19 0349 09/29/19 0349 09/30/19 0342 09/30/19 0757 10/01/19 0317  HGB 12.1*   < > 13.3  --  11.6*  HCT 36.3*  --  41.2  --  35.5*  PLT 243  --  247  --  245  CREATININE 1.06   < > 0.94 0.88 1.01   < > = values in this interval not displayed.    Estimated Creatinine Clearance: 44.1 mL/min (by C-G formula based on SCr of 1.01 mg/dL).   Medical History: Past Medical History:  Diagnosis Date  . Arthritis   . CHF (congestive heart failure) ([place])   . COPD (chronic obstructive pulmonary disease) ([place])   . Dysrhythmia    atrial fibrilation  . GERD (gastroesophageal reflux disease)   . Pulmonary fibrosis [facility])     Assessment: 86 yo M with history of atrial fibrillation now restarting heparin. Pt was on Xarelto PTA but was switched to Eliquis on 4/1 (last dose given 4/3 at 0945 per [facility]). He is s/p LHC on 4/1 with PCI stent placement to Cx when pt was started on triple therapy with ASA/plavix/Eliquis. Plans were to continue triple therapy for 2 weeks (through 4/14) then de-escalate by stopping aspirin.   Patient now s/p CVA found to have 4 infarcts in right MCA on 09/30/19. Neurology notes that risk of further emboli > risk of hemorrhagic transformation, therefore will keep on anticoagulation. Pharmacy asked to restart heparin. Will dose based off of aPTT levels until heparin level and aPTT levels correlate.   Patient was previously subtherapeutic on heparin IV 1050 units/hr earlier during this admission. Hgb decreased from 13.3 to 11.6 since yesterday, plts  wnl. No overt bleeding noted.  Goal of Therapy:  Heparin level 0.3-0.7 units/ml aPTT 66-102 seconds Monitor platelets by anticoagulation protocol: Yes   Plan:  - Stop Eliquis - Start IV heparin at 1100 units/hr, no bolus given CVA - Check 8-hr aPTT - Monitor daily aPTT/HL, CBC, s/sx bleeding - F/u restart Eliquis when able  Richardine Service, PharmD PGY1 Pharmacy Resident Phone: [phone] 10/01/2019  8:49 AM  Please check [email or web address] for unit-specific pharmacy phone numbers.

## 2019-10-02 DIAGNOSIS — I5032 Chronic diastolic (congestive) heart failure: Secondary | ICD-10-CM | POA: Diagnosis not present

## 2019-10-02 DIAGNOSIS — I469 Cardiac arrest, cause unspecified: Secondary | ICD-10-CM | POA: Diagnosis not present

## 2019-10-02 DIAGNOSIS — Z7189 Other specified counseling: Secondary | ICD-10-CM

## 2019-10-02 DIAGNOSIS — Z66 Do not resuscitate: Secondary | ICD-10-CM

## 2019-10-02 DIAGNOSIS — E039 Hypothyroidism, unspecified: Secondary | ICD-10-CM | POA: Diagnosis not present

## 2019-10-02 DIAGNOSIS — Z515 Encounter for palliative care: Secondary | ICD-10-CM | POA: Diagnosis not present

## 2019-10-02 DIAGNOSIS — F0390 Unspecified dementia without behavioral disturbance: Secondary | ICD-10-CM | POA: Diagnosis not present

## 2019-10-02 LAB — CBC WITH DIFFERENTIAL/PLATELET
Abs Immature Granulocytes: 0.1 10*3/uL — ABNORMAL HIGH (ref 0.00–0.07)
Basophils Absolute: 0.1 10*3/uL (ref 0.0–0.1)
Basophils Relative: 0 %
Eosinophils Absolute: 0.2 10*3/uL (ref 0.0–0.5)
Eosinophils Relative: 2 %
HCT: 35.2 % — ABNORMAL LOW (ref 39.0–52.0)
Hemoglobin: 11.5 g/dL — ABNORMAL LOW (ref 13.0–17.0)
Immature Granulocytes: 1 %
Lymphocytes Relative: 8 %
Lymphs Abs: 1.1 10*3/uL (ref 0.7–4.0)
MCH: 33.3 pg (ref 26.0–34.0)
MCHC: 32.7 g/dL (ref 30.0–36.0)
MCV: 102 fL — ABNORMAL HIGH (ref 80.0–100.0)
Monocytes Absolute: 1 10*3/uL (ref 0.1–1.0)
Monocytes Relative: 7 %
Neutro Abs: 11.7 10*3/uL — ABNORMAL HIGH (ref 1.7–7.7)
Neutrophils Relative %: 82 %
Platelets: 235 10*3/uL (ref 150–400)
RBC: 3.45 MIL/uL — ABNORMAL LOW (ref 4.22–5.81)
RDW: 13.8 % (ref 11.5–15.5)
WBC: 14.2 10*3/uL — ABNORMAL HIGH (ref 4.0–10.5)
nRBC: 0 % (ref 0.0–0.2)

## 2019-10-02 LAB — BASIC METABOLIC PANEL
Anion gap: 12 (ref 5–15)
BUN: 22 mg/dL (ref 8–23)
CO2: 22 mmol/L (ref 22–32)
Calcium: 8.3 mg/dL — ABNORMAL LOW (ref 8.9–10.3)
Chloride: 110 mmol/L (ref 98–111)
Creatinine, Ser: 1.07 mg/dL (ref 0.61–1.24)
GFR calc Af Amer: >60 mL/min (ref >60)
GFR calc non Af Amer: >60 mL/min (ref >60)
Glucose, Bld: 123 mg/dL — ABNORMAL HIGH (ref 70–99)
Potassium: 3.9 mmol/L (ref 3.5–5.1)
Sodium: 144 mmol/L (ref 135–145)

## 2019-10-02 LAB — HEPARIN LEVEL (UNFRACTIONATED): Heparin Unfractionated: 1.74 IU/mL — ABNORMAL HIGH (ref 0.30–0.70)

## 2019-10-02 LAB — APTT: aPTT: 60 seconds — ABNORMAL HIGH (ref 24–36)

## 2019-10-02 MED ORDER — APIXABAN 5 MG PO TABS
5.0000 mg | ORAL_TABLET | Freq: Two times a day (BID) | ORAL | Status: DC
  Administered 2019-10-02 (×2): 5 mg via ORAL
  Filled 2019-10-02 (×2): qty 1

## 2019-10-02 MED ORDER — LEVALBUTEROL HCL 0.63 MG/3ML IN NEBU
0.6300 mg | INHALATION_SOLUTION | Freq: Three times a day (TID) | RESPIRATORY_TRACT | Status: DC
  Administered 2019-10-02 – 2019-10-03 (×2): 0.63 mg via RESPIRATORY_TRACT
  Filled 2019-10-02 (×2): qty 3

## 2019-10-02 MED ORDER — FUROSEMIDE 10 MG/ML IJ SOLN
40.0000 mg | Freq: Once | INTRAMUSCULAR | Status: AC
  Administered 2019-10-02: 18:00:00 40 mg via INTRAVENOUS
  Filled 2019-10-02: qty 4

## 2019-10-02 NOTE — Consult Note (Signed)
Consultation Note Date: 10/02/2019   Patient Name: Cameron [name]  DOB: 27-Mar-1926  MRN: [id number]  Age / Sex: 86 y.o., male  PCP: Lavone Orn, MD Referring Physician: Alma Friendly, MD  Reason for Consultation: Establishing goals of care  HPI/Patient Profile: 86 y.o. male  with past medical history of CAD, diastolic heart failure, atrial fibrillation, COPD, pulmonary fibrosis, GERD, mild demential,  hypothyroidism with a recent 3/10-3/18 hospitalization for facial cellulitis, and acute encephalopathy probably related to hyponatremia, and recent falls who was readmitted 3/28 after previous discharged to [place] on 3/18. He completed his course of antibiotics while there. Family states he deteriorated while there and was in isolation where they could not see him since he was transferred from a hospital. He was  admitted on 09/26/2019 with worsening lethargy and confusion, dehydration, hyponatremia, and suspected  UTI. He  had a V fib cardiac arrest 3/28 with one defibrillation and CPR with ROSC after 4 minutes. His QT interval was prolonged and Lexapro and aricept have been stopped. He had PCI intervention with stent placement for 99% occlusion of proximal/mid LAD, cath also showed LAD 50-60% stenosis. He will require ASA for 2 weeks and continue Eliquis and Plavix for 12 months. He is on empiric antibiotic treatment. On 4/3 found to have 4 punctate infarcts in right MCA territory.   Palliative care was asked to get involved to help with ongoing goals of care conversations.   Clinical Assessment and Goals of Care: I have reviewed medical records including EPIC notes, labs and imaging, received report from bedside RN, assessed the patient.    I met with Cameron [name], his son Cameron [name] and Cameron [name] Systems developer at [facility]) to further discuss diagnosis prognosis, GOC, EOL wishes,  disposition and options.   I introduced Palliative Medicine as specialized medical care for people living with serious illness. It focuses on providing relief from the symptoms and stress of a serious illness. The goal is to improve quality of life for both the patient and the family.  I completed a life review of the patient.He worked in Administrator, arts.  He has lived at [facility] assisted living for over five years now.  He participated regularly  in group activities until COVID-19 pandemic stated and then he was isolated more. He has had both COVID-19 vaccines. He has a girlfriend, Cameron [name] at the [place]. He had recently not wanted to bathe or shave as much but was capable of caring for self. He did receive some PT for shuffling gait earlier this year. He has had issues with incontinence and wears depends.   He was in [facility] SNF after last admission but family was not happy with the incongruence between the nursing reports when they called the facility  and how Cameron [name]  was when admitted this time to the hospital.   A detailed discussion was had today regarding advanced directives.  Concepts specific to code status, continued IV antibiotics and rehospitalization was had.  The difference between a aggressive  medical intervention path  and a palliative comfort care path for this patient at this time was had. Values and goals of care important to patient and family were attempted to be elicited.  Cameron [name] desires to live as long as possible.    Discharge plans were discussed. Cameron wife received good care at [facility] and family states there was not a bed available there last time he was discharged. They are willing for Cameron [name] to go there if a bed is available. Otherwise they have discussed the possibility of Cameron [name] returning to  [facility] with hired home health care and PT. Cameron [name] is a night owl and usually skips breakfast and gets up for lunch. He was a 'walkie-talkie" per family and is usually active and social in  his ALF  Cameron [name] wishes to have medications given but no chest compressions or ventilations if he has a cardiopulmonary arrest. He does not wish to be on a ventilator. His hospital orders reflect these wishes.   Currently he is on  puree solids, nectar thick liquid and is full assist with feeding upright. ST following. If this condition does not improve while hospitalized and aspiration risk is great, feeding tube discussion may need to be had. We did not discuss this today.    Discussed the importance of continued conversation with family and their  medical providers regarding overall plan of care and treatment options, ensuring decisions are within the context of the patients values and GOCs.  Decision maker: PATIENT --> He does have a MPOA with daughter Cameron [name] as MPOA in the event he can not make decisions for himself.   Strengths: Supportive family, spiritual beliefs  SUMMARY OF RECOMMENDATIONS   DNAR/DNI  TOC --> SNF Placement --> [facility] would be family choice  TOC --> Outpatient Palliative Care  Chaplain Consult  Code Status/Advance Care Planning: DNR  No chest compressions or ventilations, Medications are okay   Symptom Management:   Breathlessness- continue supplemental oxygen as needed, Albuterol prn   Fatigue and physical Debility- PT and OT to continue   Dysphagia- puree solids, nectar thick liquid,full assist with feeding upright, ST following  Depression- off Lexapro now, emotional support, therapy as appropriate   Incontinence- on Flomax, uses depends at home.  Palliative Prophylaxis:   Aspiration and Bowel Regimen  Additional Recommendations (Limitations, Scope, Preferences):  Full Scope Treatment  Psycho-social/Spiritual:   Desire for further Chaplaincy support: Yes  Additional Recommendations: discharge planning. Family would like [facility] at [facility] or [facility] with home care. They area ware the latter may require out of pocket  expense.  Prognosis:   Unable to determine  Discharge Planning: [facility] for rehab with Palliative care service follow-up     Primary Diagnoses: Present on Admission: . Persistent atrial fibrillation ([place]) . Chronic diastolic HF (heart failure) ([place]) . Hyponatremia . (Resolved) Chronic diastolic heart failure ([place]) . Dementia ([place]) . Acute metabolic encephalopathy . Leukocytosis . Hypothyroidism . UTI (urinary tract infection)  I have reviewed the medical record, interviewed the patient and family, and examined the patient. The following aspects are pertinent.  Past Medical History:  Diagnosis Date  . Arthritis   . CHF (congestive heart failure) ([place])   . COPD (chronic obstructive pulmonary disease) ([place])   . Dysrhythmia    atrial fibrilation  . GERD (gastroesophageal reflux disease)   . Pulmonary fibrosis ([facility])    Social History   Socioeconomic History  . Marital status: Widowed    Spouse  name: Not on file  . Number of children: 4  . Years of education: Not on file  . Highest education level: Bachelor's degree (e.g., BA, AB, BS)  Occupational History  . Not on file  Tobacco Use  . Smoking status: Former Smoker    Packs/day: 2.00    Years: 40.00    Pack years: 80.00    Types: Cigarettes    Quit date: 06/29/1961    Years since quitting: 58.2  . Smokeless tobacco: Never Used  Substance and Sexual Activity  . Alcohol use: Yes    Alcohol/week: 2.0 standard drinks    Types: 2 Glasses of wine per week    Comment: occasional  . Drug use: No  . Sexual activity: Never  Other Topics Concern  . Not on file  Social History Narrative   Pt lives at [facility], pt is a widower, he has 4 children   Right handed, BS degree   Pt drinks coffee, tea, and soda when he can   He does not exercise regularly   Sometime walk      Social Determinants of Health   Financial Resource Strain:   . Difficulty of Paying Living Expenses:   Food Insecurity:   .  Worried About Charity fundraiser in the Last Year:   . Arboriculturist in the Last Year:   Transportation Needs:   . Film/video editor (Medical):   Marland Kitchen Lack of Transportation (Non-Medical):   Physical Activity:   . Days of Exercise per Week:   . Minutes of Exercise per Session:   Stress:   . Feeling of Stress :   Social Connections:   . Frequency of Communication with Friends and Family:   . Frequency of Social Gatherings with Friends and Family:   . Attends Religious Services:   . Active Member of Clubs or Organizations:   . Attends Archivist Meetings:   Marland Kitchen Marital Status:    Family History  Problem Relation Age of Onset  . Heart attack Mother   . Pulmonary embolism Father    Scheduled Meds: .  stroke: mapping our early stages of recovery book   Does not apply Once  . apixaban  5 mg Oral BID  . aspirin  81 mg Oral Daily  . Chlorhexidine Gluconate Cloth  6 each Topical Daily  . clopidogrel  75 mg Oral Q breakfast  . erythromycin  1 application Left Eye QID  . feeding supplement (ENSURE ENLIVE)  237 mL Oral TID BM  . levalbuterol  0.63 mg Nebulization Q8H  . levothyroxine  75 mcg Per Tube Q0600  . metoprolol tartrate  2.5 mg Intravenous Q6H  . pantoprazole  40 mg Oral Daily  . rosuvastatin  20 mg Oral Daily  . sodium chloride flush  3 mL Intravenous Q12H  . tamsulosin  0.4 mg Oral Q supper  . vitamin B-12  500 mcg Oral Once per day on Mon Thu   Continuous Infusions: . sodium chloride    . ceFEPime (MAXIPIME) IV 2 g (10/01/19 1959)  . heparin 1,050 Units/hr (10/01/19 1956)  . metronidazole 500 mg (10/02/19 0003)   PRN Meds:.sodium chloride, acetaminophen, albuterol, metoprolol tartrate, ondansetron (ZOFRAN) IV, polyethylene glycol, Resource ThickenUp Clear, sodium chloride flush Medications Prior to Admission:  Prior to Admission medications   Medication Sig Start Date End Date Taking? Authorizing Provider  acetaminophen (TYLENOL) 325 MG tablet Take 650  mg by mouth every 6 (six) hours as needed.  Yes [provider]  albuterol (PROVENTIL HFA;VENTOLIN HFA) 108 (90 BASE) MCG/ACT inhaler Inhale 1-2 puffs into the lungs every 6 (six) hours as needed for wheezing or shortness of breath.   Yes [provider]  calcium-vitamin D (OSCAL WITH D) 500-200 MG-UNIT tablet Take 1 tablet by mouth daily.    Yes [provider]  diltiazem (CARDIZEM CD) 120 MG 24 hr capsule Take 120 mg by mouth daily.   Yes [provider]  donepezil (ARICEPT) 10 MG tablet Take 1 tablet (10 mg total) by mouth at bedtime. 10/09/12  Yes Norins, Heinz Knuckles, MD  erythromycin ophthalmic ointment Place 1 application into the left eye 4 (four) times daily. Apply to skin vesicles on eyelid and forehead 03/24/19  Yes Bernarda Caffey, MD  escitalopram (LEXAPRO) 10 MG tablet Take 5 mg by mouth daily.   Yes [provider]  fexofenadine (ALLEGRA) 180 MG tablet Take 180 mg by mouth daily as needed for allergies or rhinitis.   Yes [provider]  furosemide (LASIX) 20 MG tablet Take 20 mg by mouth 3 (three) times a week. Take one tablet by mouth every Monday, Wednesday and Friday.   Yes [provider]  levothyroxine (SYNTHROID, LEVOTHROID) 75 MCG tablet ONE TABLET DAILY BEFORE BREAKFAST Patient taking differently: Take 75 mcg by mouth daily before breakfast.    Yes Norins, Heinz Knuckles, MD  loperamide (ANTI-DIARRHEAL) 2 MG capsule Take 2 mg by mouth as needed for diarrhea or loose stools.   Yes [provider]  omeprazole (PRILOSEC OTC) 20 MG tablet Take 20 mg by mouth daily.   Yes [provider]  Rivaroxaban (XARELTO) 15 MG TABS tablet Take 1 tablet (15 mg total) by mouth daily with supper. 08/23/13  Yes Jettie Booze, MD  solifenacin (VESICARE) 10 MG tablet Take 10 mg by mouth every morning.    Yes [provider]  tamsulosin (FLOMAX) 0.4 MG CAPS capsule Take 1 capsule daily by mouth. 04/15/17  Yes  [provider]  vitamin B-12 (CYANOCOBALAMIN) 500 MCG tablet Take 500 mcg by mouth 2 (two) times a week. Mon and Thurs   Yes [provider]  PRESCRIPTION MEDICATION 10 mLs by Mouth Rinse route in the morning and at bedtime. CLOSYS Rinse 0.25% ; use after brushing teeth every morning, and evening. Do not swallow.    [provider]   No Known Allergies Review of Systems  Constitutional: Positive for appetite change and fatigue.  Respiratory: Positive for shortness of breath.   Cardiovascular: Negative.   Neurological: Positive for weakness. Negative for speech difficulty.   Physical Exam Vitals and nursing note reviewed.  HENT:     Head: Normocephalic and atraumatic.     Mouth/Throat:     Mouth: Mucous membranes are moist.  Cardiovascular:     Rate and Rhythm: Regular rhythm. Tachycardia present.  Pulmonary:     Comments: Tachypnea, 32-40  Musculoskeletal:     Comments: Spontaneous movement all extremities  Skin:    General: Skin is warm and dry.     Capillary Refill: Capillary refill takes less than 2 seconds.     Findings: Bruising present.     Comments: On forearms  Neurological:     Mental Status: He is alert.  Psychiatric:        Attention and Perception: Attention normal.        Mood and Affect: Mood normal.    Vital Signs: BP 118/67 (BP Location: Left Arm)   Pulse Marland Kitchen)  105   Temp 98 F (36.7 C) (Oral)   Resp (!) 25   Ht 6' (1.829 m)   Wt 65.5 kg   SpO2 98%   BMI 19.58 kg/m  Pain Scale: Faces POSS *See Group Information*: S-Acceptable,Sleep, easy to arouse Pain Score: 0-No pain  SpO2: SpO2: 98 % O2 Device:SpO2: 98 % O2 Flow Rate: .O2 Flow Rate (L/min): 4 L/min  IO: Intake/output summary:   Intake/Output Summary (Last 24 hours) at 10/02/2019 0854 Last data filed at 10/02/2019 0529 Gross per 24 hour  Intake 460.92 ml  Output 2025 ml  Net -1564.08 ml    LBM: Last BM Date: 09/25/19 Baseline Weight: Weight: 68.2 kg Most recent  weight: Weight: 65.5 kg     Palliative Assessment/Data:40%   Time In: 0900 Time Out: 10:15 Time Total:75 miinutes Greater than 50%  of this time was spent counseling and coordinating care related to the above assessment and plan.  Signed by: Lindell Spar, NP Rosezella Rumpf, NP   Please contact Palliative Medicine Team phone at [phone] for questions and concerns.  For individual provider: See Shea Evans

## 2019-10-02 NOTE — Progress Notes (Signed)
ANTICOAGULATION CONSULT NOTE - Follow Up Consult  Pharmacy Consult for Heparin > Eliquis Indication: atrial fibrillation  No Known Allergies  Patient Measurements: Height: 6' (182.9 cm) Weight: 65.5 kg (144 lb 6.4 oz) IBW/kg (Calculated) : 77.6 Heparin Dosing Weight: 65.5 kg  Vital Signs: Temp: 98 F (36.7 C) (04/05 0742) Temp Source: Oral (04/05 0742) BP: 118/67 (04/05 0742) Pulse Rate: 105 (04/05 0742)  Labs: Recent Labs    09/30/19 0342 09/30/19 0342 09/30/19 0757 10/01/19 0317 10/01/19 1717 10/02/19 0240  HGB 13.3   < >  --  11.6*  --  11.5*  HCT 41.2  --   --  35.5*  --  35.2*  PLT 247  --   --  245  --  235  APTT  --   --   --   --  87* 60*  HEPARINUNFRC  --   --   --   --  2.16* 1.74*  CREATININE 0.94   < > 0.88 1.01  --  1.07   < > = values in this interval not displayed.    Estimated Creatinine Clearance: 41.7 mL/min (by C-G formula based on SCr of 1.07 mg/dL).  Assessment:  86 yo M with history of atrial fibrillation now restarting heparin. Pt was on Xarelto PTA but was switched to Eliquis on 4/1 (last dose given 4/3 at 0945 per [facility]). He is s/p LHC on 4/1 with PCI stent placement to Cx when pt was started on triple therapy with ASA/plavix/Eliquis. Plans were to continue triple therapy for 2 weeks (through 4/14) then de-escalate by stopping aspirin.   Patient now s/p CVA found to have 4 infarcts in right MCA on 09/30/19. Neurology notes that risk of further emboli is greater than the risk of hemorrhagic transformation, therefore will keep on anticoagulation. Pharmacy asked to restart heparin on 4/4. Utilized lower therapeutic CVA goal and dosed based off aPTTs, as heparin levels are falsely elevated from recent Eliquis doses.    Now to change back to Eliquis today.  APTT 60 seconds this morning on heparin at 1050 units/hr. Just below target range.   Goal of Therapy:  Heparin level 0.3-0.5 units/ml aPTT 66-85 seconds  Appropriate Eliquis dose for  indication Monitor platelets by anticoagulation protocol: Yes   Plan:   Resume Eliquis 5 mg PO BID.  Stop IV heparin when giving first Eliquis dose.   Arty Baumgartner, [place] Phone: [phone] 10/02/2019,8:21 AM

## 2019-10-02 NOTE — ACP (Advance Care Planning) (Signed)
I met with Mr. Teale, his son Inocente Salles and [name]'s wife Micholas Drumwright Systems developer at [facility]) to furtherdiscuss diagnosis prognosis, GOC, EOL wishes, disposition and options.  I introduced Palliative Medicine as specialized medical care for people living with serious illness. It focuses on providing relief from the symptoms and stress of a serious illness. The goal is to improve quality of life for both the patient and the family.  A detailed discussion was had today regarding advanced directives. Concepts specific to code status, continued IV antibiotics and rehospitalization was had. The difference between a aggressive medical intervention path and a palliative comfort care path for this patient at this time was had. Values and goals of care important to patient and family were attempted to be elicited.  [name] desires to live as long as possible.    Mr. Cubit wishes to have medications given but no chest compressions or ventilations if he has a cardiopulmonary arrest. He does not wish to be on a ventilator. His hospital orders reflect these wishes.   Discussed the importance of continued conversation with family and their  medical providers regarding overall plan of care and treatment options, ensuring decisions are within the context of the patients values and GOCs.   Strengths: Supportive family, spiritual beliefs  SUMMARY OF RECOMMENDATIONS   DNAR/DNI  Time In: 0900 Time Out: 0930 Time Total: 30 miinutes Greater than 50%  of this time was spent counseling and coordinating care related to the above assessment and plan.  Signed by: Lindell Spar, NP Rosezella Rumpf, NP   Please contact Palliative Medicine Team phone at [phone] for questions and concerns.  For individual provider: See Shea Evans

## 2019-10-02 NOTE — Progress Notes (Signed)
Progress Note  Patient Name: BAYARD DEFUSCO Date of Encounter: 10/02/2019  Primary Cardiologist: Dr. Harrington Challenger  Subjective   Stopped in to see Mr. Ulman who was working with speech therapy. He is alert and feels ok, denies pain.    Inpatient Medications    Scheduled Meds: .  stroke: mapping our early stages of recovery book   Does not apply Once  . apixaban  5 mg Oral BID  . aspirin  81 mg Oral Daily  . Chlorhexidine Gluconate Cloth  6 each Topical Daily  . clopidogrel  75 mg Oral Q breakfast  . erythromycin  1 application Left Eye QID  . feeding supplement (ENSURE ENLIVE)  237 mL Oral TID BM  . levalbuterol  0.63 mg Nebulization TID  . levothyroxine  75 mcg Per Tube Q0600  . metoprolol tartrate  2.5 mg Intravenous Q6H  . pantoprazole  40 mg Oral Daily  . rosuvastatin  20 mg Oral Daily  . sodium chloride flush  3 mL Intravenous Q12H  . tamsulosin  0.4 mg Oral Q supper  . vitamin B-12  500 mcg Oral Once per day on Mon Thu   Continuous Infusions: . sodium chloride    . ceFEPime (MAXIPIME) IV 2 g (10/02/19 1058)  . metronidazole 500 mg (10/02/19 1116)   PRN Meds: sodium chloride, acetaminophen, albuterol, metoprolol tartrate, ondansetron (ZOFRAN) IV, polyethylene glycol, Resource ThickenUp Clear, sodium chloride flush   Vital Signs    Vitals:   10/02/19 1000 10/02/19 1118 10/02/19 1200 10/02/19 1600  BP: 104/64  105/60 114/74  Pulse: (!) 125 (!) 104 (!) 35 (!) 33  Resp: (!) 35 (!) 38 (!) 35 (!) 34  Temp:      TempSrc:      SpO2: 98% 97% 97% 97%  Weight:      Height:        Intake/Output Summary (Last 24 hours) at 10/02/2019 1628 Last data filed at 10/02/2019 1300 Gross per 24 hour  Intake 610.92 ml  Output 1200 ml  Net -589.08 ml   Last 3 Weights 09/30/2019 09/27/2019 09/27/2019  Weight (lbs) 144 lb 6.4 oz 143 lb 15.4 oz 141 lb 12.1 oz  Weight (kg) 65.5 kg 65.3 kg 64.3 kg      Telemetry    afib rates 90-105 - Personally Reviewed  ECG    No new - Personally  Reviewed  Physical Exam   GEN: Frail, alert, responds appropriately Cardiac: irregular rhythm, tachcardia  Respiratory: shallow breathing, bibasilar crackles GI: Soft, nontender, non-distended  MS: No edema; No deformity. Neuro:  Nonfocal  Psych: Normal affect    Labs    High Sensitivity Troponin:   Recent Labs  Lab 09/24/19 2103 09/24/19 2346  TROPONINIHS 10,382* 9,437*      Chemistry Recent Labs  Lab 09/30/19 0757 10/01/19 0317 10/02/19 0240  NA 140 143 144  K 4.9 4.9 3.9  CL 108 109 110  CO2 19* 23 22  GLUCOSE 128* 141* 123*  BUN 17 21 22   CREATININE 0.88 1.01 1.07  CALCIUM 8.5* 8.5* 8.3*  GFRNONAA >60 >60 >60  GFRAA >60 >60 >60  ANIONGAP 13 11 12      Hematology Recent Labs  Lab 09/30/19 0342 10/01/19 0317 10/02/19 0240  WBC 11.0* 13.2* 14.2*  RBC 3.97* 3.44* 3.45*  HGB 13.3 11.6* 11.5*  HCT 41.2 35.5* 35.2*  MCV 103.8* 103.2* 102.0*  MCH 33.5 33.7 33.3  MCHC 32.3 32.7 32.7  RDW 13.7 14.0 13.8  PLT 247  245 235    BNPNo results for input(s): BNP, PROBNP in the last 168 hours.   DDimer No results for input(s): DDIMER in the last 168 hours.   Radiology    CT ANGIO HEAD W OR WO CONTRAST  Result Date: 10/01/2019 CLINICAL DATA:  Stroke follow-up EXAM: CT ANGIOGRAPHY HEAD AND NECK TECHNIQUE: Multidetector CT imaging of the head and neck was performed using the standard protocol during bolus administration of intravenous contrast. Multiplanar CT image reconstructions and MIPs were obtained to evaluate the vascular anatomy. Carotid stenosis measurements (when applicable) are obtained utilizing NASCET criteria, using the distal internal carotid diameter as the denominator. CONTRAST:  28mL OMNIPAQUE IOHEXOL 350 MG/ML SOLN COMPARISON:  Brain MRI 09/30/2019 FINDINGS: CT HEAD FINDINGS Brain: There is no mass, hemorrhage or extra-axial collection. The size and configuration of the ventricles and extra-axial CSF spaces are normal. There is hypoattenuation of the  periventricular white matter, most commonly indicating chronic ischemic microangiopathy. Skull: The visualized skull base, calvarium and extracranial soft tissues are normal. Sinuses/Orbits: No fluid levels or advanced mucosal thickening of the visualized paranasal sinuses. No mastoid or middle ear effusion. The orbits are normal. CTA NECK FINDINGS SKELETON: There is no bony spinal canal stenosis. No lytic or blastic lesion. OTHER NECK: Normal pharynx, larynx and major salivary glands. No cervical lymphadenopathy. Unremarkable thyroid gland. UPPER CHEST: Severe bilateral interstitial lung disease with small pleural effusions. AORTIC ARCH: There is mild calcific atherosclerosis of the aortic arch. There is no aneurysm, dissection or hemodynamically significant stenosis of the visualized portion of the aorta. Conventional 3 vessel aortic branching pattern. The visualized proximal subclavian arteries are widely patent. RIGHT CAROTID SYSTEM: No dissection, occlusion or aneurysm. Mild atherosclerotic calcification at the carotid bifurcation without hemodynamically significant stenosis. LEFT CAROTID SYSTEM: No dissection, occlusion or aneurysm. There is predominantly calcified atherosclerosis extending into the proximal ICA, resulting in 50% stenosis. VERTEBRAL ARTERIES: Left dominant configuration. Both origins are clearly patent. There is no dissection, occlusion or flow-limiting stenosis to the skull base (V1-V3 segments). CTA HEAD FINDINGS POSTERIOR CIRCULATION: --Vertebral arteries: Normal V4 segments. --Posterior inferior cerebellar arteries (PICA): Patent origins from the vertebral arteries. --Anterior inferior cerebellar arteries (AICA): Patent origins from the basilar artery. --Basilar artery: Normal. --Superior cerebellar arteries: Normal. --Posterior cerebral arteries: Normal. Both originate from the basilar artery. Posterior communicating arteries (p-comm) are diminutive or absent. ANTERIOR CIRCULATION:  --Intracranial internal carotid arteries: Atherosclerotic calcification of the internal carotid arteries at the skull base without hemodynamically significant stenosis. --Anterior cerebral arteries (ACA): Normal. Both A1 segments are present. Patent anterior communicating artery (a-comm). --Middle cerebral arteries (MCA): Normal. VENOUS SINUSES: As permitted by contrast timing, patent. ANATOMIC VARIANTS: None Review of the MIP images confirms the above findings. IMPRESSION: 1. No emergent large vessel occlusion. 2. 50% stenosis of the proximal left ICA secondary to predominantly calcified atherosclerosis. 3. Severe interstitial lung disease with small pleural effusions. Electronically Signed   By: Ulyses Jarred M.D.   On: 10/01/2019 04:17   CT ANGIO NECK W OR WO CONTRAST  Result Date: 10/01/2019 CLINICAL DATA:  Stroke follow-up EXAM: CT ANGIOGRAPHY HEAD AND NECK TECHNIQUE: Multidetector CT imaging of the head and neck was performed using the standard protocol during bolus administration of intravenous contrast. Multiplanar CT image reconstructions and MIPs were obtained to evaluate the vascular anatomy. Carotid stenosis measurements (when applicable) are obtained utilizing NASCET criteria, using the distal internal carotid diameter as the denominator. CONTRAST:  12mL OMNIPAQUE IOHEXOL 350 MG/ML SOLN COMPARISON:  Brain MRI 09/30/2019 FINDINGS:  CT HEAD FINDINGS Brain: There is no mass, hemorrhage or extra-axial collection. The size and configuration of the ventricles and extra-axial CSF spaces are normal. There is hypoattenuation of the periventricular white matter, most commonly indicating chronic ischemic microangiopathy. Skull: The visualized skull base, calvarium and extracranial soft tissues are normal. Sinuses/Orbits: No fluid levels or advanced mucosal thickening of the visualized paranasal sinuses. No mastoid or middle ear effusion. The orbits are normal. CTA NECK FINDINGS SKELETON: There is no bony spinal  canal stenosis. No lytic or blastic lesion. OTHER NECK: Normal pharynx, larynx and major salivary glands. No cervical lymphadenopathy. Unremarkable thyroid gland. UPPER CHEST: Severe bilateral interstitial lung disease with small pleural effusions. AORTIC ARCH: There is mild calcific atherosclerosis of the aortic arch. There is no aneurysm, dissection or hemodynamically significant stenosis of the visualized portion of the aorta. Conventional 3 vessel aortic branching pattern. The visualized proximal subclavian arteries are widely patent. RIGHT CAROTID SYSTEM: No dissection, occlusion or aneurysm. Mild atherosclerotic calcification at the carotid bifurcation without hemodynamically significant stenosis. LEFT CAROTID SYSTEM: No dissection, occlusion or aneurysm. There is predominantly calcified atherosclerosis extending into the proximal ICA, resulting in 50% stenosis. VERTEBRAL ARTERIES: Left dominant configuration. Both origins are clearly patent. There is no dissection, occlusion or flow-limiting stenosis to the skull base (V1-V3 segments). CTA HEAD FINDINGS POSTERIOR CIRCULATION: --Vertebral arteries: Normal V4 segments. --Posterior inferior cerebellar arteries (PICA): Patent origins from the vertebral arteries. --Anterior inferior cerebellar arteries (AICA): Patent origins from the basilar artery. --Basilar artery: Normal. --Superior cerebellar arteries: Normal. --Posterior cerebral arteries: Normal. Both originate from the basilar artery. Posterior communicating arteries (p-comm) are diminutive or absent. ANTERIOR CIRCULATION: --Intracranial internal carotid arteries: Atherosclerotic calcification of the internal carotid arteries at the skull base without hemodynamically significant stenosis. --Anterior cerebral arteries (ACA): Normal. Both A1 segments are present. Patent anterior communicating artery (a-comm). --Middle cerebral arteries (MCA): Normal. VENOUS SINUSES: As permitted by contrast timing, patent.  ANATOMIC VARIANTS: None Review of the MIP images confirms the above findings. IMPRESSION: 1. No emergent large vessel occlusion. 2. 50% stenosis of the proximal left ICA secondary to predominantly calcified atherosclerosis. 3. Severe interstitial lung disease with small pleural effusions. Electronically Signed   By: Ulyses Jarred M.D.   On: 10/01/2019 04:17   MR BRAIN WO CONTRAST  Result Date: 09/30/2019 CLINICAL DATA:  Encephalopathy EXAM: MRI HEAD WITHOUT CONTRAST TECHNIQUE: Multiplanar, multiecho pulse sequences of the brain and surrounding structures were obtained without intravenous contrast. COMPARISON:  2018 FINDINGS: Brain: Few scattered punctate foci of reduced diffusion in the right frontoparietal white matter as well as the right lentiform nucleus. There is no evidence of intracranial hemorrhage. Patchy and confluent T2 hyperintensity in the supratentorial much greater than pontine white matter is nonspecific but probably reflects moderate chronic microvascular ischemic changes. Prominence of the ventricles and sulci reflects generalized parenchymal volume loss. There is no intracranial mass, mass effect, or edema. There is no hydrocephalus or extra-axial fluid collection. Vascular: Diminished proximal right intracranial vertebral artery flow void also present on the prior study. Major vessel flow voids at the skull base are otherwise preserved. Skull and upper cervical spine: Normal marrow signal is preserved. Sinuses/Orbits: Trace mucosal thickening. Bilateral lens replacements. Other: Sella is partially empty.  Mastoid air cells are clear. IMPRESSION: Few (less than 10) punctate acute infarcts in the right MCA territory. Moderate chronic microvascular ischemic changes. Electronically Signed   By: Macy Mis M.D.   On: 09/30/2019 18:53   DG CHEST PORT 1 VIEW  Result Date:  10/01/2019 CLINICAL DATA:  Tachypnea and constipation EXAM: PORTABLE CHEST 1 VIEW COMPARISON:  09/24/2019 FINDINGS:  Bilateral airspace disease which is extensive. Opacity is mildly increased on the left. Chronic cardiomegaly with stable aortic contours. No visible effusion or pneumothorax. IMPRESSION: Bilateral pneumonia with mild progression on the left since prior Electronically Signed   By: Monte Fantasia M.D.   On: 10/01/2019 09:01   DG Abd Portable 1V  Result Date: 10/01/2019 CLINICAL DATA:  Shortness of breath EXAM: PORTABLE ABDOMEN - 1 VIEW COMPARISON:  09/24/2019 FINDINGS: Normal bowel gas pattern. Extensive high-density in the bladder from recent CTA head neck. This shows prominent trabeculation of the bladder with cellules or diverticula. Pulmonary findings as described on dedicated chest x-ray .  Cholelithiasis IMPRESSION: 1. Normal bowel gas pattern. 2. Findings of chronic urinary outlet obstruction. The bladder is not over distended currently. 3. Cholelithiasis. Electronically Signed   By: Monte Fantasia M.D.   On: 10/01/2019 09:03   DG Swallowing Func-Speech Pathology  Result Date: 10/01/2019 Objective Swallowing Evaluation: Type of Study: MBS-Modified Barium Swallow Study  Patient Details Name: DERIC VIETMEIER MRN: [id number] Date of Birth: [birth year] Today's Date: 10/01/2019 Time: SLP Start Time (ACUTE ONLY): 1237 -SLP Stop Time (ACUTE ONLY): 1250 SLP Time Calculation (min) (ACUTE ONLY): 13 min Past Medical History: Past Medical History: Diagnosis Date . Arthritis  . CHF (congestive heart failure) ([place])  . COPD (chronic obstructive pulmonary disease) ([place])  . Dysrhythmia   atrial fibrilation . GERD (gastroesophageal reflux disease)  . Pulmonary fibrosis ([place])  Past Surgical History: Past Surgical History: Procedure Laterality Date . CORONARY STENT INTERVENTION N/A 10/19/2019  Procedure: CORONARY STENT INTERVENTION;  Surgeon: Belva Crome, MD;  Location: [facility];  Service: Cardiovascular;  Laterality: N/A; . hemmroidectmy   . HERNIA REPAIR   . LEFT HEART CATH AND CORONARY ANGIOGRAPHY N/A 10/16/2019   Procedure: LEFT HEART CATH AND CORONARY ANGIOGRAPHY;  Surgeon: Belva Crome, MD;  Location: [facility];  Service: Cardiovascular;  Laterality: N/A; . LEFT HEART CATHETERIZATION WITH CORONARY ANGIOGRAM N/A 10/07/2012  Procedure: LEFT HEART CATHETERIZATION WITH CORONARY ANGIOGRAM, possible PCI;  Surgeon: Jettie Booze, MD;  Location: [facility];  Service: Cardiovascular;  Laterality: N/A; . TONSILLECTOMY   HPI: Pt is a 86 year old male with past medical history of GERD, COPD, chronic Afib, hiatal hernia, and diastolic CHF  who presented to the ED after exhibiting a several day history of progressively worsening lethargy and confusion diagnosed with acute encephalopathy and found to have UTI. Vtach arrest on 3/28; CPR 4 mins; ETT 3/28-3/30 at 11:20. MRI brain 4/3: Few (less than 10) punctate acute infarcts in the right MCA territory.  No data recorded Assessment / Plan / Recommendation CHL IP CLINICAL IMPRESSIONS 10/01/2019 Clinical Impression Pt presents with moderate oropharyngeal dysphagia characterized by prolonged mastication, impaired bolus cohesion, a pharyngeal delay, reduced lingual retraction, and reduced anterior laryngeal movement. He demonstrated premature spillage to the level of the valleculae and pyriform sinuses, lngual residue, vallecular residue, pyriform sinus residue, intermittent incomplete epiglottic retroversion, and aspiration (PAS 7,8) of thin liquids. Coughing was ineffective in expelling aspirate from the larynx. Pt was unable to effectively and consistently demonstrate postural modifications despite models or verbal and tactile cues. A dysphagia 1 (puree) diet with nectar thick liquids is recommended at this time. SLP will follow for dysphagia tx as clinically indicated pending decision regarding goals of care.  SLP Visit Diagnosis Dysphagia, oropharyngeal phase (R13.12) Attention and concentration deficit following --  Frontal lobe and executive function deficit following --  Impact on safety and function Mild aspiration risk;Moderate aspiration risk   CHL IP TREATMENT RECOMMENDATION 10/01/2019 Treatment Recommendations Therapy as outlined in treatment plan below   Prognosis 10/01/2019 Prognosis for Safe Diet Advancement Fair Barriers to Reach Goals Cognitive deficits;Severity of deficits Barriers/Prognosis Comment -- CHL IP DIET RECOMMENDATION 10/01/2019 SLP Diet Recommendations Dysphagia 1 (Puree) solids;Nectar thick liquid Liquid Administration via Straw;Cup Medication Administration Crushed with puree Compensations Slow rate;Small sips/bites;Follow solids with liquid Postural Changes Seated upright at 90 degrees;Remain semi-upright after after feeds/meals (Comment)   No flowsheet data found.  CHL IP FOLLOW UP RECOMMENDATIONS 10/01/2019 Follow up Recommendations Other (comment)   CHL IP FREQUENCY AND DURATION 10/01/2019 Speech Therapy Frequency (ACUTE ONLY) min 2x/week Treatment Duration 2 weeks      CHL IP ORAL PHASE 10/01/2019 Oral Phase Impaired Oral - Pudding Teaspoon -- Oral - Pudding Cup -- Oral - Honey Teaspoon -- Oral - Honey Cup -- Oral - Nectar Teaspoon -- Oral - Nectar Cup -- Oral - Nectar Straw Premature spillage;Decreased bolus cohesion Oral - Thin Teaspoon -- Oral - Thin Cup -- Oral - Thin Straw Premature spillage;Decreased bolus cohesion Oral - Puree Premature spillage;Decreased bolus cohesion;Lingual/palatal residue Oral - Mech Soft -- Oral - Regular Decreased bolus cohesion;Lingual/palatal residue;Impaired mastication Oral - Multi-Consistency -- Oral - Pill -- Oral Phase - Comment --  CHL IP PHARYNGEAL PHASE 10/01/2019 Pharyngeal Phase Impaired Pharyngeal- Pudding Teaspoon -- Pharyngeal -- Pharyngeal- Pudding Cup -- Pharyngeal -- Pharyngeal- Honey Teaspoon -- Pharyngeal -- Pharyngeal- Honey Cup -- Pharyngeal -- Pharyngeal- Nectar Teaspoon -- Pharyngeal -- Pharyngeal- Nectar Cup -- Pharyngeal -- Pharyngeal- Nectar Straw Delayed swallow initiation-vallecula;Delayed swallow  initiation-pyriform sinuses;Pharyngeal residue - pyriform;Pharyngeal residue - valleculae;Reduced tongue base retraction;Reduced anterior laryngeal mobility Pharyngeal -- Pharyngeal- Thin Teaspoon -- Pharyngeal -- Pharyngeal- Thin Cup -- Pharyngeal -- Pharyngeal- Thin Straw Delayed swallow initiation-vallecula;Delayed swallow initiation-pyriform sinuses;Pharyngeal residue - pyriform;Pharyngeal residue - valleculae;Reduced tongue base retraction;Reduced anterior laryngeal mobility;Penetration/Aspiration during swallow Pharyngeal Material enters airway, passes BELOW cords and not ejected out despite cough attempt by patient;Material enters airway, passes BELOW cords without attempt by patient to eject out (silent aspiration) Pharyngeal- Puree Pharyngeal residue - pyriform;Pharyngeal residue - valleculae;Reduced tongue base retraction;Reduced anterior laryngeal mobility;Delayed swallow initiation-vallecula Pharyngeal -- Pharyngeal- Mechanical Soft -- Pharyngeal -- Pharyngeal- Regular Delayed swallow initiation-vallecula;Pharyngeal residue - pyriform;Pharyngeal residue - valleculae;Reduced tongue base retraction;Reduced anterior laryngeal mobility Pharyngeal -- Pharyngeal- Multi-consistency -- Pharyngeal -- Pharyngeal- Pill -- Pharyngeal -- Pharyngeal Comment --  CHL IP CERVICAL ESOPHAGEAL PHASE 10/01/2019 Cervical Esophageal Phase WFL Pudding Teaspoon -- Pudding Cup -- Honey Teaspoon -- Honey Cup -- Nectar Teaspoon -- Nectar Cup -- Nectar Straw -- Thin Teaspoon -- Thin Cup -- Thin Straw -- Puree -- Mechanical Soft -- Regular -- Multi-consistency -- Pill -- Cervical Esophageal Comment -- Shanika I. [name], [place], [place] Office number [phone] Pager [place] 10/01/2019, 2:31 PM               Cardiac Studies   Cath:  Non-ST elevation myocardial infarction complicated by polymorphic ventricular tachycardia/cardiac arrest.  Successful resuscitation.  99% proximal  dominant circumflex with reduced TIMI flow (grade II -III) was treated with PCI and stent reducing the stenosis to 0% with TIMI grade III flow (18 x 2.5 mm Onyx postdilated to 3.0 mm in diameter.  Separate left coronary ostia.  Proximal LAD contains eccentric 50% stenosis.  Circumflex is as noted above.  Right coronary is nondominant.  There is ostial 50% narrowing.  Inferobasal akinesis extending to the mid inferior wall.  EF 40 to 45%.  LVEDP 10 mmHg.  Recommendation:   Aspirin, reduced dose Xarelto, and Plavix for 2 weeks then drop aspirin.  Combination of Xarelto and Plavix should be for 12 months.  Further management per team.  2 hours of Aggrastat to allow antiplatelet effect until absorption of Plavix.  Echo: 1. Left ventricular ejection fraction, by estimation, is 35 to 40%. The  left ventricle has moderately decreased function. The left ventricle  demonstrates regional wall motion abnormalities (see scoring  diagram/findings for description).  Inferior/inferolateral akinesis. Left ventricular diastolic parameters are  indeterminate.  2. Right ventricular systolic function is mildly reduced. The right  ventricular size is mildly enlarged.  3. Right atrial size was severely dilated.  4. The mitral valve is normal in structure. Moderate mitral valve  regurgitation.  5. The tricuspid valve is abnormal. Tricuspid valve regurgitation is  moderate.  6. The aortic valve is tricuspid. Aortic valve regurgitation is mild to  moderate. Mild aortic valve sclerosis is present, with no evidence of  aortic valve stenosis.   Patient Profile     86 y.o. male 86 year old male with past medical history of permanent atrial fibrillation, chronic diastolic heart failure, dementia, COPD admitted with encephalopathy after hemodynamically unstable ventricular tachycardia with agonal breathing requiring defibrillation x1 and intubation, subsequently in atrial fibrillation.  Troponins  were elevated and he underwent PCI to the left circumflex, with an ejection fraction of 35 to 40%, mild RV dysfunction with mildly enlarged right ventricle, moderate tricuspid regurgitation, moderate mitral valve regurgitation, mild aortic stenosis.  Assessment & Plan    #1 non-ST elevation MI-status post PCI of the left circumflex. Eliquis restarted today, plan for aspirin, Plavix, Eliquis for 2 weeks then can discontinue aspirin. Continue Plavix and Eliquis for 12 months.   #2 paroxysmal atrial fibrillation-variable heart rates in atrial fibrillation, he has received metoprolol tartrate 2.5 mg IV, 3 doses yesterday, 2 doses thus far today.  I spoke to his nurse who said he is tolerating this therapy well, has soft blood pressure.  Heart rate around 100 on my rounds.  Can continue this at this today to continue to work towards maximally tolerated BB therapy for rate control.  #3 s/p VT arrest requiring defibrillation - NSTEMI vs QT prolongation from Lexapro.   #4 Ischemic CM - BB, no arb due to low BP. He responded well to lasix 40 mg IV once, will redose today and continue to monitor for response.     For questions or updates, please contact [place] Please consult [email or web address] for contact info under        Signed, Elouise Munroe, MD  10/02/2019, 4:28 PM

## 2019-10-02 NOTE — Progress Notes (Signed)
  Speech Language Pathology Treatment: Dysphagia  Patient Details Name: Cameron [name] MRN: [id number] DOB: 13-Dec-1925 Today's Date: 10/02/2019 Time: [id number] SLP Time Calculation (min) (ACUTE ONLY): 13 min  Assessment / Plan / Recommendation Clinical Impression  F/u after yesterday's MBS.  Pt repositioned in bed to optimize safety.  Reviewed results of MBS and concerns for aspiration, as well as the benefit of thickening liquids - in the short-term- to allow recovery from acute illness and minimize aspiration. Pt verbalized understanding; difficult to ascertain how well he was processing.  He consumed 4 oz of thickened Ensure, feeding himself, with occasional verbal cues needed to take a break when WOB increased and RR reached 30-34 range. No overt s/s of aspiration were observed.  New sign posted at [facility]. SLP will follow for safety/diet progression.   HPI HPI: Pt is a 86 year old male with past medical history of GERD, COPD, chronic Afib, hiatal hernia, and diastolic CHF  who presented to the ED after exhibiting a several day history of progressively worsening lethargy and confusion diagnosed with acute encephalopathy and found to have UTI. Vtach arrest on 3/28; CPR 4 mins; ETT 3/28-3/30 at 11:20. MRI brain 4/3: Few (less than 10) punctate acute infarcts in the right MCA territory.       SLP Plan  Continue with current plan of care       Recommendations  Diet recommendations: Dysphagia 1 (puree);Nectar-thick liquid Liquids provided via: Cup;Straw Medication Administration: Crushed with puree Supervision: Patient able to self feed;Staff to assist with self feeding Compensations: Slow rate;Small sips/bites;Follow solids with liquid                Oral Care Recommendations: Oral care BID Follow up Recommendations: Other (comment)(tbd) SLP Visit Diagnosis: Dysphagia, oropharyngeal phase (R13.12) Plan: Continue with current plan of care       [place].  Cameron [name], [place] CCC/SLP Acute Rehabilitation Services Office number [phone] Pager [phone]  Cameron [name] 10/02/2019, 5:09 PM

## 2019-10-02 NOTE — Evaluation (Signed)
Speech Language Pathology Evaluation Patient Details Name: Cameron [name] MRN: [id number] DOB: 01-10-1926 Today's Date: 10/02/2019 Time: [id number] SLP Time Calculation (min) (ACUTE ONLY): 33 min  Problem List:  Patient Active Problem List   Diagnosis Date Noted  . Palliative care by specialist   . Goals of care, counseling/discussion   . DNR (do not resuscitate)   . Acute metabolic encephalopathy [id number]  . Leukocytosis 09/24/2019  . Hypothyroidism 09/24/2019  . UTI (urinary tract infection) 09/24/2019  . Cardiac arrest ([place])   . Anemia 09/07/2019  . Hyponatremia 09/07/2019  . Anticoagulated 05/06/2017  . Spells of decreased attentiveness 04/01/2017  . Community acquired pneumonia   . Physical deconditioning   . Influenza-like illness 05/27/2015  . Atrial fibrillation ([place]) 08/23/2013  . CAP (community acquired pneumonia) 07/17/2013  . Dementia ([place]) 07/12/2013  . Weakness generalized 07/12/2013  . Diarrhea 07/12/2013  . Protein-calorie malnutrition, severe ([place]) 03/30/2013  . PNA (pneumonia) 03/28/2013  . Fever 03/27/2013  . Hypotension 03/27/2013  . Sepsis ([place]) 03/26/2013  . FUO (fever of unknown origin) 03/26/2013  . NICM (nonischemic cardiomyopathy) EF 30% 03/26/2013  . Chronic diastolic HF (heart failure) ([place]) 03/26/2013  . Persistent atrial fibrillation ([place]) 10/04/2012  . Acute systolic heart failure ([place]) 10/04/2012  . ALLERGIC RHINITIS 03/15/2007  . BRONCHIECTASIS, W/O ACUTE EXACERBATION 03/15/2007  . PULMONARY FIBROSIS, POSTINFLAMMATORY 03/15/2007   Past Medical History:  Past Medical History:  Diagnosis Date  . Arthritis   . CHF (congestive heart failure) ([place])   . COPD (chronic obstructive pulmonary disease) ([place])   . Dysrhythmia    atrial fibrilation  . GERD (gastroesophageal reflux disease)   . Pulmonary fibrosis ([place])    Past Surgical History:  Past Surgical History:  Procedure Laterality Date  . CORONARY STENT INTERVENTION N/A 10/18/2019    Procedure: CORONARY STENT INTERVENTION;  Surgeon: Belva Crome, MD;  Location: [facility];  Service: Cardiovascular;  Laterality: N/A;  . hemmroidectmy    . HERNIA REPAIR    . LEFT HEART CATH AND CORONARY ANGIOGRAPHY N/A 10/25/2019   Procedure: LEFT HEART CATH AND CORONARY ANGIOGRAPHY;  Surgeon: Belva Crome, MD;  Location: [facility];  Service: Cardiovascular;  Laterality: N/A;  . LEFT HEART CATHETERIZATION WITH CORONARY ANGIOGRAM N/A 10/07/2012   Procedure: LEFT HEART CATHETERIZATION WITH CORONARY ANGIOGRAM, possible PCI;  Surgeon: Jettie Booze, MD;  Location: [facility];  Service: Cardiovascular;  Laterality: N/A;  . TONSILLECTOMY     HPI:  Pt is a 86 year old male with past medical history of GERD, COPD, chronic Afib, hiatal hernia, and diastolic CHF  who presented to the ED after exhibiting a several day history of progressively worsening lethargy and confusion diagnosed with acute encephalopathy and found to have UTI. Vtach arrest on 3/28; CPR 4 mins; ETT 3/28-3/30 at 11:20. MRI brain 4/3: Few (less than 10) punctate acute infarcts in the right MCA territory.    Assessment / Plan / Recommendation Clinical Impression  Pt participated in cognitive-linguistic evaluation.  He was conversational; speech was fluent without dysathria, and expressive/receptive language were WNL.  Pt was assessed with the [facility] Mental Status Exam.  He scored a 22/30, falling within the range of mild neurocognitive disorder.  Areas of difficulty were related to recall - storage and retrieval of verbal information, including listening to a short paragraph and answering questions about its details.  Pt demonstrated strengths in selective attention, mental math, clock-drawing task, and following commands.  He was oriented and  exhibited a sense of humor throughout the assessment (in response to the question "what state are we in?" he answered, "frenzy"). Pt was tired, requiring occasional  prompts to persist through tasks.  Will follow briefly while in acute care to address cognition along with swallowing.      SLP Assessment  SLP Recommendation/Assessment: Patient needs continued Speech Lanaguage Pathology Services SLP Visit Diagnosis: Cognitive communication deficit (R41.841)    Follow Up Recommendations  Other (comment)(tbd)    Frequency and Duration min 2x/week  2 weeks      SLP Evaluation Cognition  Overall Cognitive Status: No family/caregiver present to determine baseline cognitive functioning Arousal/Alertness: Awake/alert Orientation Level: Oriented X4 Attention: Selective Selective Attention: Appears intact Memory: Impaired Memory Impairment: Storage deficit;Retrieval deficit Problem Solving: Appears intact       Comprehension  Auditory Comprehension Overall Auditory Comprehension: Appears within functional limits for tasks assessed Reading Comprehension Reading Status: Not tested    Expression Expression Primary Mode of Expression: Verbal Verbal Expression Overall Verbal Expression: Appears within functional limits for tasks assessed Written Expression Dominant Hand: Right   Oral / Motor  Motor Speech Overall Motor Speech: Appears within functional limits for tasks assessed   GO                    Juan Quam Laurice 10/02/2019, 5:27 PM   Beniah Magnan L. Tivis Ringer, [place] Office number [phone] Pager [phone]

## 2019-10-02 NOTE — Progress Notes (Signed)
PROGRESS NOTE  Cameron [name] MSX:[id number] DOB: Feb 11, 1926 DOA: 09/18/2019 PCP: Lavone Orn, MD  HPI/Recap of past 24 hours: HPI from PCCM 86 year old man with history of HFpEF, COPD, atrial fibrillation on Xarelto, CAD, pulmonary fibrosis, GERD, hypothyroidism, recent hospitalization from 3/10-3/18 for facial cellulitis and hyponatremia presenting again with acute encephalopathy.  He has been having worse lethargy over the past few days.  There have been some reported falls at the facility.  Upon arrival to [facility] ED, he was found to have a UTI and started on antibiotics.  On evening of 3/28 he was found to be in pulseless Vtach.  CPR was initiated.  He was defibrillated x1.  ROSC was achieved after 4 minutes.  He had agonal breathing and was intubated extubated on 09/26/2019. Was previously on Lexapro, however given increase in Qtc >500 and Vtach, Lexapro was discontinued. Repeat EKG revealed Qtc of ~480s. Home Donepezil restarted on 3/29 and am QTc was 498. Held home Donepezil on 3/30 and 3/31 am QTc was 440.  Patient was subsequently stabilized.  [facility] hospitalist assumed care on 09/27/2019.     Today, met patient resting in bed, more interactive and awake today, able to follow commands, still tachypneic, denies any pain, chest pain, abdominal pain, nausea/vomiting, fever/chills.  Still appears lethargic overall.  Dr. Radford Pax who is the daughter-in-law was present at bedside.      Assessment/Plan: Principal Problem:   Cardiac arrest [facility]) Active Problems:   Persistent atrial fibrillation (HCC)   Chronic diastolic HF (heart failure) (HCC)   Dementia (HCC)   Hyponatremia   Acute metabolic encephalopathy   Leukocytosis   Hypothyroidism   UTI (urinary tract infection)   Palliative care by specialist   Goals of care, counseling/discussion   DNR (do not resuscitate)  Cardiac arrest/torsades Noted to have V. tach with defibrillation, prolonged QTC Intubated on 3/28, extubated  on 3/30 Cardiology on board, appreciate recs Discontinue recently started Lexapro as well as home Aricept Continue to monitor electrolytes closely  NSTEMI complicated by polymorphic ventricular tachycardia/history of CAD/ischemic cardiomyopathy Echo done on 09/25/2019 showed EF of 35 to 40%, with regional wall motion abnormalities Cardiology on board, status post LHC on 10/08/2019 which showed 99% proximal dominant circumflex status post PCI stent Continue aspirin, Eliquis, Plavix for 2 weeks then stop aspirin.  Combination of Eliquis and Plavix should be for 12 months Continue Crestor, IV metoprolol, losartan held  Acute metabolic encephalopathy/delirium History of underlying dementia, with current delirium ??  HCAP/aspiration pneumonia, intermittent tachypnea, hypoxia Remains afebrile, with rising leukocytosis UA unremarkable (recently completed treatment for UTI) Chest x-ray showed possible interval development of mild pulmonary edema with background scarring/fibrosis.  Superimposed infection or aspiration could be considered, repeat chest x-ray showed bilateral pneumonia SLP consulted for swallow evaluation Continue to monitor closely  Acute hypoxic respiratory failure Likely 2/2 HCAP/aspiration pneumonia Intubated on 3/28, extubated on 3/30 Chest x-ray as above Continue cefepime, metronidazole Give 1 dose of Lasix on 10/01/2019 Continue pulmonary hygiene Supplemental oxygen prn Duo nebs as needed  Acute infarct/CVA-likely embolic CT head unremarkable MRI showed- 4 punctate infarcts at right MCA territory Likely related to cardiac cath procedure or A. fib Neurology on board, continue aspirin, clopidogrel, eliquis  UTI Currently afebrile, with no leukocytosis Blood culture x2 no growth to date UC grew E. Coli, sensitive to ceftriaxone Completed 7 days of ceftriaxone on 4/2  Paroxysmal A. Fib Switch to metoprolol and Eliquis Plan to discontinue home diltiazem and  Xarelto  Normocytic chronic anemia  Hemoglobin baseline around 12 No obvious signs of bleeding Daily CBC  History of COPD Continue albuterol as needed, supplemental oxygen  Hypothyroidism Continue home Synthroid  BPH Continue home Flomax  Dementia Plan to discontinue Aricept due to noted prolonged QTC, V. tach and cardiac arrest  GOC discussion Patient with very poor prognosis, advanced age, history of dementia, DNR status Palliative care consulted for further goals of care discussion       Malnutrition Type:  Nutrition Problem: Severe Malnutrition Etiology: chronic illness(COPD)   Malnutrition Characteristics:  Signs/Symptoms: severe fat depletion, severe muscle depletion, energy intake < or equal to 75% for > or equal to 1 month   Nutrition Interventions:  Interventions: Tube feeding    Estimated body mass index is 19.58 kg/m as calculated from the following:   Height as of this encounter: 6' (1.829 m).   Weight as of this encounter: 65.5 kg.     Code Status: DNR  Family Communication: Discussed with daughter in law at bedside on 10/02/2019  Disposition Plan: Patient came from SNF, plan to DC to SNF as per recommendations from PT/OT.  TOC notified.  Cardiology clearance pending, still requiring IV antibiotics.  Diabetes stable to discharge in 24 to 48 hours if patient continues to improve   Consultants:  Cardiology  PCCM  Neurology  Procedures:  Mechanical ventilation  Antimicrobials:  Cefepime  Metronidazole  DVT prophylaxis: Eliquis   Objective: Vitals:   10/02/19 0849 10/02/19 1000 10/02/19 1118 10/02/19 1200  BP:  104/64  105/60  Pulse:  (!) 125 (!) 104 (!) 35  Resp:  (!) 35 (!) 38 (!) 35  Temp:      TempSrc:      SpO2: 98% 98% 97% 97%  Weight:      Height:        Intake/Output Summary (Last 24 hours) at 10/02/2019 1542 Last data filed at 10/02/2019 1300 Gross per 24 hour  Intake 610.92 ml  Output 1200 ml  Net -589.08 ml    Filed Weights   09/27/19 0300 09/27/19 1912 10/25/2019 1955  Weight: 64.3 kg 65.3 kg 65.5 kg    Exam:  General: NAD, chronically ill-appearing, oriented to self only, lethargic  Cardiovascular: S1, S2 present  Respiratory:  Mild bibasilar crackles noted  Abdomen: Soft, nontender, nondistended, bowel sounds present  Musculoskeletal: No bilateral pedal edema noted  Skin: Normal  Psychiatry:  Normal mood   Data Reviewed: CBC: Recent Labs  Lab 10/23/2019 0237 09/29/19 0349 09/30/19 0342 10/01/19 0317 10/02/19 0240  WBC 9.5 8.7 11.0* 13.2* 14.2*  NEUTROABS 7.3 6.8 8.6* 10.8* 11.7*  HGB 11.5* 12.1* 13.3 11.6* 11.5*  HCT 33.1* 36.3* 41.2 35.5* 35.2*  MCV 99.1 101.1* 103.8* 103.2* 102.0*  PLT 195 243 247 245 409   Basic Metabolic Panel: Recent Labs  Lab 09/27/19 0653 10/02/2019 0237 09/29/19 0349 09/30/19 0342 09/30/19 0757 10/01/19 0317 10/02/19 0240  NA 136   < > 139 143 140 143 144  K 3.8   < > 3.5 6.0* 4.9 4.9 3.9  CL 103   < > 106 110 108 109 110  CO2 21*   < > 21* 20* 19* 23 22  GLUCOSE 88   < > 113* 132* 128* 141* 123*  BUN 24*   < > _0 CREATININE 1.20   < > 1.06 0.94 0.88 1.01 1.07  CALCIUM 8.3*   < > 8.3* 8.8* 8.5* 8.5* 8.3*  MG 2.3  --   --   --   --   --   --    < > =  values in this interval not displayed.   GFR: Estimated Creatinine Clearance: 41.7 mL/min (by C-G formula based on SCr of 1.07 mg/dL). Liver Function Tests: No results for input(s): AST, ALT, ALKPHOS, BILITOT, PROT, ALBUMIN in the last 168 hours. No results for input(s): LIPASE, AMYLASE in the last 168 hours. Recent Labs  Lab 10/01/19 0317  AMMONIA 13   Coagulation Profile: No results for input(s): INR, PROTIME in the last 168 hours. Cardiac Enzymes: No results for input(s): CKTOTAL, CKMB, CKMBINDEX, TROPONINI in the last 168 hours. BNP (last 3 results) No results for input(s): PROBNP in the last 8760 hours. HbA1C: Recent Labs    10/01/19 0317  HGBA1C 5.7*    CBG: No results for input(s): GLUCAP in the last 168 hours. Lipid Profile: No results for input(s): CHOL, HDL, LDLCALC, TRIG, CHOLHDL, LDLDIRECT in the last 72 hours. Thyroid Function Tests: No results for input(s): TSH, T4TOTAL, FREET4, T3FREE, THYROIDAB in the last 72 hours. Anemia Panel: No results for input(s): VITAMINB12, FOLATE, FERRITIN, TIBC, IRON, RETICCTPCT in the last 72 hours. Urine analysis:    Component Value Date/Time   COLORURINE YELLOW 09/30/2019 0449   APPEARANCEUR HAZY (A) 09/30/2019 0449   LABSPEC 1.029 09/30/2019 0449   PHURINE 5.0 09/30/2019 0449   GLUCOSEU NEGATIVE 09/30/2019 0449   HGBUR SMALL (A) 09/30/2019 0449   BILIRUBINUR NEGATIVE 09/30/2019 0449   KETONESUR NEGATIVE 09/30/2019 0449   PROTEINUR 100 (A) 09/30/2019 0449   UROBILINOGEN 0.2 07/12/2013 2230   NITRITE NEGATIVE 09/30/2019 0449   LEUKOCYTESUR NEGATIVE 09/30/2019 0449   Sepsis Labs: _0 (procalcitonin:4,lacticidven:4)  ) Recent Results (from the past 240 hour(s))  Urine culture     Status: Abnormal   Collection Time: 09/24/19  2:11 AM   Specimen: Urine, Catheterized  Result Value Ref Range Status   Specimen Description URINE, CATHETERIZED  Final   Special Requests   Final    NONE Performed at [facility], [address], [place], [place] [address]    Culture >=100,000 COLONIES/mL ESCHERICHIA COLI (A)  Final   Report Status 09/26/2019 FINAL  Final   Organism ID, Bacteria ESCHERICHIA COLI (A)  Final      Susceptibility   Escherichia coli - MIC*    AMPICILLIN >=32 RESISTANT Resistant     CEFAZOLIN >=64 RESISTANT Resistant     CEFTRIAXONE 0.5 SENSITIVE Sensitive     CIPROFLOXACIN >=4 RESISTANT Resistant     GENTAMICIN <=1 SENSITIVE Sensitive     IMIPENEM <=0.25 SENSITIVE Sensitive     NITROFURANTOIN <=16 SENSITIVE Sensitive     TRIMETH/SULFA <=20 SENSITIVE Sensitive     AMPICILLIN/SULBACTAM >=32 RESISTANT Resistant     PIP/TAZO >=128 RESISTANT Resistant     *  >=100,000 COLONIES/mL ESCHERICHIA COLI  Blood culture (routine x 2)     Status: None   Collection Time: 09/24/19  3:50 AM   Specimen: BLOOD  Result Value Ref Range Status   Specimen Description BLOOD RIGHT ARM  Final   Special Requests   Final    BOTTLES DRAWN AEROBIC AND ANAEROBIC Blood Culture adequate volume   Culture   Final    NO GROWTH 5 DAYS Performed at [facility], [address], [place],  [address]    Report Status 09/29/2019 FINAL  Final  Blood culture (routine x 2)     Status: None   Collection Time: 09/24/19  3:50 AM   Specimen: BLOOD  Result Value Ref Range Status   Specimen Description BLOOD LEFT ARM  Final  Special Requests   Final    BOTTLES DRAWN AEROBIC AND ANAEROBIC Blood Culture adequate volume   Culture   Final    NO GROWTH 5 DAYS Performed at [facility], [place] [address] [place], [place] [address]    Report Status 09/29/2019 FINAL  Final  SARS CORONAVIRUS 2 (TAT 6-24 HRS) Nasopharyngeal Nasopharyngeal Swab     Status: None   Collection Time: 09/24/19  4:27 AM   Specimen: Nasopharyngeal Swab  Result Value Ref Range Status   SARS Coronavirus 2 NEGATIVE NEGATIVE Final    Comment: (NOTE) SARS-CoV-2 target nucleic acids are NOT DETECTED. The SARS-CoV-2 RNA is generally detectable in upper and lower respiratory specimens during the acute phase of infection. Negative results do not preclude SARS-CoV-2 infection, do not rule out co-infections with other pathogens, and should not be used as the sole basis for treatment or other patient management decisions. Negative results must be combined with clinical observations, patient history, and epidemiological information. The expected result is Negative. Fact Sheet for Patients: SugarRoll.be Fact Sheet for Healthcare Providers: https://www.woods-mathews.com/ This test is not yet approved or cleared by the [place] FDA and  has been authorized for  detection and/or diagnosis of SARS-CoV-2 by FDA under an Emergency Use Authorization (EUA). This EUA will remain  in effect (meaning this test can be used) for the duration of the COVID-19 declaration under Section 56 4(b)(1) of the Act, 21 U.S.C. section 360bbb-3(b)(1), unless the authorization is terminated or revoked sooner. Performed at [facility], [place] [address], [place], [place] [address]   MRSA PCR Screening     Status: None   Collection Time: 09/24/19  9:30 PM   Specimen: Nasopharyngeal  Result Value Ref Range Status   MRSA by PCR NEGATIVE NEGATIVE Final    Comment:        The GeneXpert MRSA Assay (FDA approved for NASAL specimens only), is one component of a comprehensive MRSA colonization surveillance program. It is not intended to diagnose MRSA infection nor to guide or monitor treatment for MRSA infections. Performed at [facility], [place] [address] [place], [place] [address]       Studies: No results found.  Scheduled Meds: .  stroke: mapping our early stages of recovery book   Does not apply Once  . apixaban  5 mg Oral BID  . aspirin  81 mg Oral Daily  . Chlorhexidine Gluconate Cloth  6 each Topical Daily  . clopidogrel  75 mg Oral Q breakfast  . erythromycin  1 application Left Eye QID  . feeding supplement (ENSURE ENLIVE)  237 mL Oral TID BM  . levalbuterol  0.63 mg Nebulization TID  . levothyroxine  75 mcg Per Tube Q0600  . metoprolol tartrate  2.5 mg Intravenous Q6H  . pantoprazole  40 mg Oral Daily  . rosuvastatin  20 mg Oral Daily  . sodium chloride flush  3 mL Intravenous Q12H  . tamsulosin  0.4 mg Oral Q supper  . vitamin B-12  500 mcg Oral Once per day on Mon Thu    Continuous Infusions: . sodium chloride    . ceFEPime (MAXIPIME) IV 2 g (10/02/19 1058)  . metronidazole 500 mg (10/02/19 1116)     LOS: 8 days     Alma Friendly, MD [facility]  If 7PM-7AM, please contact  night-coverage [email or web address] 10/02/2019, 3:42 PM

## 2019-10-03 DIAGNOSIS — G9341 Metabolic encephalopathy: Secondary | ICD-10-CM

## 2019-10-03 DIAGNOSIS — I5032 Chronic diastolic (congestive) heart failure: Secondary | ICD-10-CM

## 2019-10-03 DIAGNOSIS — Z955 Presence of coronary angioplasty implant and graft: Secondary | ICD-10-CM

## 2019-10-03 DIAGNOSIS — J189 Pneumonia, unspecified organism: Secondary | ICD-10-CM | POA: Diagnosis not present

## 2019-10-03 DIAGNOSIS — E039 Hypothyroidism, unspecified: Secondary | ICD-10-CM | POA: Diagnosis not present

## 2019-10-03 DIAGNOSIS — I469 Cardiac arrest, cause unspecified: Secondary | ICD-10-CM | POA: Diagnosis not present

## 2019-10-03 DIAGNOSIS — F0390 Unspecified dementia without behavioral disturbance: Secondary | ICD-10-CM | POA: Diagnosis not present

## 2019-10-03 DIAGNOSIS — Z7189 Other specified counseling: Secondary | ICD-10-CM | POA: Diagnosis not present

## 2019-10-03 DIAGNOSIS — R627 Adult failure to thrive: Secondary | ICD-10-CM | POA: Diagnosis not present

## 2019-10-03 LAB — BASIC METABOLIC PANEL
Anion gap: 10 (ref 5–15)
BUN: 29 mg/dL — ABNORMAL HIGH (ref 8–23)
CO2: 22 mmol/L (ref 22–32)
Calcium: 8 mg/dL — ABNORMAL LOW (ref 8.9–10.3)
Chloride: 111 mmol/L (ref 98–111)
Creatinine, Ser: 1.12 mg/dL (ref 0.61–1.24)
GFR calc Af Amer: >60 mL/min (ref >60)
GFR calc non Af Amer: 57 mL/min — ABNORMAL LOW (ref >60)
Glucose, Bld: 141 mg/dL — ABNORMAL HIGH (ref 70–99)
Potassium: 3.8 mmol/L (ref 3.5–5.1)
Sodium: 143 mmol/L (ref 135–145)

## 2019-10-03 LAB — CBC WITH DIFFERENTIAL/PLATELET
Abs Immature Granulocytes: 0.11 10*3/uL — ABNORMAL HIGH (ref 0.00–0.07)
Basophils Absolute: 0 10*3/uL (ref 0.0–0.1)
Basophils Relative: 0 %
Eosinophils Absolute: 0.1 10*3/uL (ref 0.0–0.5)
Eosinophils Relative: 1 %
HCT: 32.8 % — ABNORMAL LOW (ref 39.0–52.0)
Hemoglobin: 10.7 g/dL — ABNORMAL LOW (ref 13.0–17.0)
Immature Granulocytes: 1 %
Lymphocytes Relative: 7 %
Lymphs Abs: 1 10*3/uL (ref 0.7–4.0)
MCH: 33.1 pg (ref 26.0–34.0)
MCHC: 32.6 g/dL (ref 30.0–36.0)
MCV: 101.5 fL — ABNORMAL HIGH (ref 80.0–100.0)
Monocytes Absolute: 0.9 10*3/uL (ref 0.1–1.0)
Monocytes Relative: 6 %
Neutro Abs: 13.4 10*3/uL — ABNORMAL HIGH (ref 1.7–7.7)
Neutrophils Relative %: 85 %
Platelets: 252 10*3/uL (ref 150–400)
RBC: 3.23 MIL/uL — ABNORMAL LOW (ref 4.22–5.81)
RDW: 13.9 % (ref 11.5–15.5)
WBC: 15.5 10*3/uL — ABNORMAL HIGH (ref 4.0–10.5)
nRBC: 0 % (ref 0.0–0.2)

## 2019-10-03 LAB — PROCALCITONIN: Procalcitonin: 0.1 ng/mL

## 2019-10-03 MED ORDER — MORPHINE BOLUS VIA INFUSION
4.0000 mg | INTRAVENOUS | Status: DC | PRN
  Administered 2019-10-03 (×3): 4 mg via INTRAVENOUS
  Filled 2019-10-03: qty 4

## 2019-10-03 MED ORDER — GLYCOPYRROLATE 0.2 MG/ML IJ SOLN: 0.2000 mg | INTRAMUSCULAR | Status: DC | PRN

## 2019-10-03 MED ORDER — BIOTENE DRY MOUTH MT LIQD
15.0000 mL | OROMUCOSAL | Status: DC | PRN
  Administered 2019-10-03 (×2): 15 mL via TOPICAL

## 2019-10-03 MED ORDER — LORAZEPAM 2 MG/ML IJ SOLN
0.5000 mg | INTRAMUSCULAR | Status: DC | PRN
  Administered 2019-10-03: 17:00:00 0.5 mg via INTRAVENOUS
  Filled 2019-10-03: qty 1

## 2019-10-03 MED ORDER — MORPHINE 100MG IN NS 100ML (1MG/ML) PREMIX INFUSION
2.0000 mg/h | INTRAVENOUS | Status: DC
  Administered 2019-10-03: 18:00:00 4 mg/h via INTRAVENOUS
  Administered 2019-10-03: 15:00:00 2 mg/h via INTRAVENOUS
  Filled 2019-10-03: qty 100

## 2019-10-03 MED ORDER — POLYVINYL ALCOHOL 1.4 % OP SOLN
1.0000 [drp] | Freq: Four times a day (QID) | OPHTHALMIC | Status: DC | PRN
  Filled 2019-10-03: qty 15

## 2019-10-03 MED ORDER — MORPHINE BOLUS VIA INFUSION
2.0000 mg | INTRAVENOUS | Status: DC | PRN
  Administered 2019-10-03 (×2): 2 mg via INTRAVENOUS
  Filled 2019-10-03: qty 2

## 2019-10-03 MED ORDER — MORPHINE SULFATE (PF) 2 MG/ML IV SOLN
2.0000 mg | INTRAVENOUS | Status: DC | PRN
  Administered 2019-10-03 (×2): 2 mg via INTRAVENOUS
  Filled 2019-10-03 (×3): qty 1

## 2019-10-03 MED ORDER — ACETAMINOPHEN 650 MG RE SUPP: 650.0000 mg | Freq: Four times a day (QID) | RECTAL | Status: DC | PRN

## 2019-10-03 MED ORDER — MORPHINE SULFATE (PF) 2 MG/ML IV SOLN
2.0000 mg | INTRAVENOUS | Status: DC | PRN
  Administered 2019-10-03: 08:00:00 2 mg via INTRAVENOUS
  Filled 2019-10-03: qty 1

## 2019-10-03 MED ORDER — LORAZEPAM 2 MG/ML IJ SOLN
1.0000 mg | INTRAMUSCULAR | Status: DC | PRN
  Administered 2019-10-03: 1 mg via INTRAVENOUS
  Filled 2019-10-03: qty 1

## 2019-10-03 NOTE — Progress Notes (Signed)
Provided spiritual care to family following decision to receive palliative care support.  Several family members present.  Family is engaged in anticipatory grief and reflecting on the life of their father and grandfather. The family is providing mutual support to one another.  Notified family pastor at their request.

## 2019-10-03 NOTE — Progress Notes (Addendum)
   Cameron [name] is a 86 year old male with past medical history of permanent atrial fibrillation (not on anticoagulation prior to admission), chronic diastolic heart failure, dementia, COPD, nonobstructive CAD, GERD, hypothyroidism who was recently in the hospital for facial cellulitis and required discharge to SNF. He was recently found to have worsening lethargy and confusion so was brought back to the ED. He had leukocytosis and CXR suggestive of increased interstitial markings and possible infection. CT showed basilar fibrotic interstitial lung disease with mild honeycombing diagnostic of interstitial pneumonia. While in the hospital he developed hemodynamically unstable ventricular tachycardia with agonal breathing requiring defibrillation x1 and intubation. He has subsequently been in atrial fibrillation. Troponins were c/w NSTEMI. 2D Echo 09/25/19 showed EF 35-40%, mildly reduced RV function with enlarged RV, moderate tricuspid regurgitation, moderate mitral valve regurgitation, mild aortic stenosis. He underwent cath showing 99% LCx treated with PCI/DES, otherwise 50% prox LAD and 50% ostial RCA, LVEF 40-45% by cath. He has been diuresed due to his acute systolic CHF. He was also found to have acute infarcts, likely embolic (4 punctate infarcts at right MCA territory). Hospital course also notable for UTI and prolonged QTc. Palliative care has been involved for goals of care given progressive decline and multiple system involvement. This morning he was noted to have decreased mentation and worsening hypoxia. Palliative medicine has been closely involved with care. They were in the room when we arrived. The plan is to proceed with comfort care measures only. Support provided to son, Cameron [name], who is the husband of our cardiologist Dr. Radford Pax. We will be available to assist if needed, please call if we can help in any capacity. Appreciate the excellent care provided by the palliative medicine team. Melina Copa  PA-C.  -------------------------------------------------------------- I visited with Cameron [name] who was sedate on my visit. His son, Cameron [name], was present in the room. We discussed goals for care with palliative medicine present. Decided lasix can be used prn, however given mental status currently, comfort is the primary goal.  Cardiology is available at any time for questions and support. Elouise Munroe, MD.

## 2019-10-03 NOTE — Progress Notes (Addendum)
Daily Progress Note   Patient Name: Cameron [name]       Date: 10/03/2019 DOB: 1925-08-12  Age: 86 y.o. MRN#: [id number] Attending Physician: Alma Friendly, MD Primary Care Physician: Lavone Orn, MD Admit Date: 09/06/2019  Reason for Consultation/Follow-up: Establishing goals of care  Subjective: Patient showed decline this morning. Tachypnea, tachycardic, and minimally responsive. On assessment patient observed with use of accessory muscles and respiratory distress. RN increased oxygen via nasal cannula up to 4L for support. Will move lower extremity to touch, no response to verbal stimuli. Son, [name]. Is at the bedside providing patient with support reading poem and playing soothing music.   Patient's daughter, Cameron [name] called via phone and is currently driving in from [place], [place]. Expected arrival around 1345. [name] updated siblings via phone on their fathers change. Discussed at length with [name] regarding his father's changes and noticeable decline. [name] shares patient was awake, alert, and engaged in discussions yesterday. We discussed patient's signs of actively dying and goals of care. [name] reports goals for patient care given change in condition is for comfort with an awareness of anticipated hospital death.   Education provided on comfort care, discontinuation of medications not focused on symptom management/comfort, and discontinuation of medical interventions such as telemetry monitoring, labwork, and frequent vital signs. Son verbalized understanding and appreciation. I discussed with son the use of medications such as morphine to assist with respiratory distress/air hunger. Patient currently receiving PRN medications. I educated family on the use of continuous drip with a goal of  better respiratory support, decrease work of breathing, and allowing patient to be in a more comfort state. Son verbalized understanding and family would really like all children to be present prior to initiating drip. Also request to continue with full 4L oxygen support until daughter is able to arrive later today. Support given.   [name] verbalized understanding of transitioning patient to a full comfort approach with plans to re-evaluate symptoms frequently and assess for need of morphine drip once closer to daughters arrival.   1330-patient remains minimally responsive to touch only. Noted to continue to have increased respiratory efforts despite PRN morphine. Warm to touch. SCD hose removed for comfort. Daughter, Cameron [name] now at the bedside along with patient's grandson. Education and support provided to family. Discussed use of morphine drip to  increase patient's comfort and assist with better respiratory control in the setting of tachypnea. Family verbalized understanding and patient's daughter Cameron [name] should be arriving within the next 30 minutes. Both [name] and [name] are in agreement in initiating morphine drip for symptom management. [name] expressed goal is for patient to be in a more comfortable state and not suffering. Support and comfort provided. Family sharing memories of patient and grandson.   All questions answered and support given. Family is aware of anticipated hospital death.   Length of Stay: 9  Current Medications: Scheduled Meds:    stroke: mapping our early stages of recovery book   Does not apply Once   apixaban  5 mg Oral BID   aspirin  81 mg Oral Daily   Chlorhexidine Gluconate Cloth  6 each Topical Daily   clopidogrel  75 mg Oral Q breakfast   erythromycin  1 application Left Eye QID   feeding supplement (ENSURE ENLIVE)  237 mL Oral TID BM   levalbuterol  0.63 mg Nebulization TID   levothyroxine  75 mcg Per Tube Q0600   metoprolol tartrate  2.5 mg Intravenous  Q6H   pantoprazole  40 mg Oral Daily   rosuvastatin  20 mg Oral Daily   sodium chloride flush  3 mL Intravenous Q12H   tamsulosin  0.4 mg Oral Q supper   vitamin B-12  500 mcg Oral Once per day on Mon Thu    Continuous Infusions:  sodium chloride     ceFEPime (MAXIPIME) IV 2 g (10/02/19 2206)   metronidazole 500 mg (10/03/19 0020)    PRN Meds: sodium chloride, acetaminophen, albuterol, metoprolol tartrate, morphine injection, ondansetron (ZOFRAN) IV, polyethylene glycol, Resource ThickenUp Clear, sodium chloride flush  Physical Exam        -minimally responsive to touch, no verbal response, chronically-ill appearing, actively dying -tachycardic -tachypneic (26), use of accessory muscles  -warm to touch   Vital Signs: BP (!) 109/58 (BP Location: Left Arm)    Pulse (!) 116    Temp 99.5 F (37.5 C) (Axillary)    Resp (!) 28    Ht 6' (1.829 m)    Wt 65.5 kg    SpO2 95%    BMI 19.58 kg/m  SpO2: SpO2: 95 % O2 Device: O2 Device: Nasal Cannula O2 Flow Rate: O2 Flow Rate (L/min): 4 L/min  Intake/output summary:   Intake/Output Summary (Last 24 hours) at 10/03/2019 0946 Last data filed at 10/03/2019 0645 Gross per 24 hour  Intake 150 ml  Output 1500 ml  Net -1350 ml   LBM: Last BM Date: (Unknown) Baseline Weight: Weight: 68.2 kg Most recent weight: Weight: 65.5 kg       Palliative Assessment/Data: Comfort PPS 10%      Patient Active Problem List   Diagnosis Date Noted   Palliative care by specialist    Goals of care, counseling/discussion    DNR (do not resuscitate)    Acute metabolic encephalopathy [id number]   Leukocytosis 09/24/2019   Hypothyroidism 09/24/2019   UTI (urinary tract infection) 09/24/2019   Cardiac arrest ([place])    Anemia 09/07/2019   Hyponatremia 09/07/2019   Anticoagulated 05/06/2017   Spells of decreased attentiveness 04/01/2017   Community acquired pneumonia    Physical deconditioning    Influenza-like illness 05/27/2015     Atrial fibrillation ([place]) 08/23/2013   CAP (community acquired pneumonia) 07/17/2013   Dementia ([place]) 07/12/2013   Weakness generalized 07/12/2013   Diarrhea 07/12/2013   Protein-calorie malnutrition, severe ([place]) 03/30/2013  PNA (pneumonia) 03/28/2013   Fever 03/27/2013   Hypotension 03/27/2013   Sepsis ([place]) 03/26/2013   FUO (fever of unknown origin) 03/26/2013   NICM (nonischemic cardiomyopathy) EF 30% 03/26/2013   Chronic diastolic HF (heart failure) ([place]) 03/26/2013   Persistent atrial fibrillation ([place]) [id number]   Acute systolic heart failure ([place]) 10/04/2012   ALLERGIC RHINITIS 03/15/2007   BRONCHIECTASIS, W/O ACUTE EXACERBATION 03/15/2007   PULMONARY FIBROSIS, POSTINFLAMMATORY 03/15/2007    Palliative Care Assessment & Plan   Patient Profile: Per Sharyn Lull, NP (Palliative): 86 y.o. male  with past medical history of CAD, diastolic heart failure, atrial fibrillation, COPD, pulmonary fibrosis, GERD, mild demential,  hypothyroidism with a recent 3/10-3/18 hospitalization for facial cellulitis, and acute encephalopathy probably related to hyponatremia, and recent falls who was readmitted 3/28 after previous discharged to [facility] on 3/18. He completed his course of antibiotics while there. Family states he deteriorated while there and was in isolation where they could not see him since he was transferred from a hospital. He was  admitted on 09/08/2019 with worsening lethargy and confusion, dehydration, hyponatremia, and suspected  UTI. He  had a V fib cardiac arrest 3/28 with one defibrillation and CPR with ROSC after 4 minutes. His QT interval was prolonged and Lexapro and aricept have been stopped. He had PCI intervention with stent placement for 99% occlusion of proximal/mid LAD, cath also showed LAD 50-60% stenosis. He will require ASA for 2 weeks and continue Eliquis and Plavix for 12 months. He is on empiric antibiotic treatment. On 4/3 found to  have 4 punctate infarcts in right MCA territory.   Palliative care was asked to get involved to help with ongoing goals of care conversations.   Recommendations/Plan:  DNR/DNI-as confirmed by family  Transition care to full comfort/EOL care  Family aware of anticipated hospital death. Expressed goals for full comfort/symptom management.   Morphine drip with bolus for symptom management/comfort  Robinul PRN for secretions  Ativan PRN for anxiety/agitation  Liquifilm tears PRN for dry eyes  D/C telemetry, may place in comfort mode  Unrestricted visitations per policy in the setting of EOL care   Comfort cart for family  Will d/c all medications/medical interventions not comfort focused  PMT will continue to support and follow   Goals of Care and Additional Recommendations:  Limitations on Scope of Treatment: Full Comfort Care  Code Status:    Code Status Orders  (From admission, onward)         Start     Ordered   09/26/19 1802  Do not attempt resuscitation (DNR)  Continuous    Question Answer Comment  In the event of cardiac or respiratory ARREST Do not call a code blue   In the event of cardiac or respiratory ARREST Do not perform Intubation, CPR, defibrillation or ACLS   In the event of cardiac or respiratory ARREST Use medication by any route, position, wound care, and other measures to relive pain and suffering. May use oxygen, suction and manual treatment of airway obstruction as needed for comfort.      09/26/19 1801        Code Status History    Date Active Date Inactive Code Status Order ID Comments User Context   09/24/2019 2204 09/26/2019 1801 Full Code [id number]  Milagros Loll, MD Inpatient   09/24/2019 0348 09/24/2019 2204 Full Code [id number]  Vernelle Emerald, MD ED   09/06/2019 2343 09/14/2019 1931 Full Code [id number]  Shela Leff, MD ED  05/27/2015 1750 05/29/2015 2208 Full Code [id number]  Orson Eva, MD Inpatient   07/13/2013 0058  07/17/2013 2018 Full Code [id number]  Phillips Grout, MD Inpatient   03/26/2013 2226 03/31/2013 1743 Full Code [id number]  Phillips Grout, MD Inpatient   10/04/2012 1404 10/09/2012 1540 Full Code [id number]  Charlynne Cousins, MD Inpatient   Advance Care Planning Activity    Advance Directive Documentation     Most Recent Value  Type of Advance Directive  Healthcare Power of Attorney  Pre-existing out of facility DNR order (yellow form or pink MOST form)  --  "MOST" Form in Place?  --      Prognosis:   Hours - Days  Discharge Planning:  Anticipated Hospital Death  Care plan was discussed with patient's son, Kendrick Fries, RN, Dr. Margaretann Loveless, and Dr. Horris Latino.  Thank you for allowing the Palliative Medicine Team to assist in the care of this patient.  Time Total: 65 min.   Visit consisted of counseling and education dealing with the complex and emotionally intense issues of symptom management and palliative care in the setting of serious and potentially life-threatening illness.Greater than 50%  of this time was spent counseling and coordinating care related to the above assessment and plan.  Alda Lea, AGPCNP-BC  Palliative Medicine Team [phone]  Please contact Palliative Medicine Team phone at [phone] for questions and concerns.

## 2019-10-03 NOTE — Progress Notes (Signed)
PT Cancellation Note  Patient Details Name: Cameron [name] MRN: [id number] DOB: [birth year]   Cancelled Treatment:    Reason Eval/Treat Not Completed: Medical issues which prohibited therapy. Pt with decline in status today, will likely d/c from therapy but will check back tomorrow to make sure this is the case. Spoke with pt's RN, Erasmo Downer.   Leighton Roach, [place]  Pager [phone] Office [place] 10/03/2019, 1:49 PM

## 2019-10-03 NOTE — Progress Notes (Addendum)
Dr. Horris Latino made aware of patient increase work of breathing this am ,patient on oxygen at 4-5 L [place], Md in room to see patient. Palliative care team made aware of patient status. Will be up to see patient. Family updated and in room. Will monitor patient. Malka Bocek, Bettina Gavia RN

## 2019-10-03 NOTE — Progress Notes (Signed)
PROGRESS NOTE  Cameron [name] [id number] DOB: 1925/08/01 DOA: 09/12/2019 PCP: Lavone Orn, MD  HPI/Recap of past 24 hours: HPI from PCCM 86 year old man with history of HFpEF, COPD, atrial fibrillation on Xarelto, CAD, pulmonary fibrosis, GERD, hypothyroidism, recent hospitalization from 3/10-3/18 for facial cellulitis and hyponatremia presenting again with acute encephalopathy.  He has been having worse lethargy over the past few days.  There have been some reported falls at the facility.  Upon arrival to [facility] ED, he was found to have a UTI and started on antibiotics.  On evening of 3/28 he was found to be in pulseless Vtach.  CPR was initiated.  He was defibrillated x1.  ROSC was achieved after 4 minutes.  He had agonal breathing and was intubated extubated on 09/26/2019. Was previously on Lexapro, however given increase in Qtc >500 and Vtach, Lexapro was discontinued. Repeat EKG revealed Qtc of ~480s. Home Donepezil restarted on 3/29 and am QTc was 498. Held home Donepezil on 3/30 and 3/31 am QTc was 440.  Patient was subsequently stabilized.  [facility] hospital[facility] assumed care on 09/27/2019.    Overnight, patient noted to gradually decline with increased work of breathing, more lethargic and decreased responsiveness.  This a.m. patient continued to decline, and was switched to comfort care.  Met son Ademola Vert at bedside.  Started patient on morphine as needed for comfort.  Palliative care contacted for comfort care transition.      Assessment/Plan: Principal Problem:   Cardiac arrest [facility]) Active Problems:   Persistent atrial fibrillation (H[facility])   Chronic diastolic HF (heart failure) (H[facility])   Dementia (HCC)   Hyponatremia   Acute metabolic encephalopathy   Leukocytosis   Hypothyroidism   UTI (urinary tract infection)   Palliative care by specialist   Goals of care, counseling/discussion   DNR (do not resuscitate)  Cardiac arrest/torsades Noted to have V. tach with  defibrillation, prolonged QTC Intubated on 3/28, extubated on 3/30 Currently comfort care  NSTEMI complicated by polymorphic ventricular tachycardia/history of CAD/ischemic cardiomyopathy Echo done on 09/25/2019 showed EF of 35 to 40%, with regional wall motion abnormalities Cardiology consulted status post [place] on 10/11/2019 which showed 99% proximal dominant circumflex status post PCI stent Comfort care  Acute metabolic encephalopathy/delirium History of underlying dementia, with current delirium ??  HCAP/aspiration pneumonia, Vs acute infarct UA unremarkable (recently completed treatment for UTI) Chest x-ray showed possible interval development of mild pulmonary edema with background scarring/fibrosis.  Superimposed infection or aspiration could be considered, repeat chest x-ray showed bilateral pneumonia Started on cefepime, metronidazole, was discontinued due to comfort care measures  Acute hypoxic respiratory failure Likely 2/2 HCAP/aspiration pneumonia Intubated on 3/28, extubated on 3/30 Comfort care measures  Acute infarct/CVA-likely embolic CT head unremarkable MRI showed- 4 punctate infarcts at right MCA territory Likely related to cardiac cath procedure or A. fib Neurology consulted  Comfort care measures  UTI UC grew E. Coli, sensitive to ceftriaxone Completed 7 days of ceftriaxone on 4/2  Paroxysmal A. Fib Comfort care measures, was on metoprolol and Eliquis, currently discontinued  Normocytic chronic anemia Hemoglobin baseline around 12 No obvious signs of bleeding  History of COPD Comfort care measures  Hypothyroidism  BPH  Dementia  GOC discussion Patient with very poor prognosis, advanced age, history of dementia, DNR status Palliative care on board, transition to comfort care measures       Malnutrition Type:  Nutrition Problem: Severe Malnutrition Etiology: chronic illness(COPD)   Malnutrition Characteristics:  Signs/Symptoms: severe fat  depletion,  severe muscle depletion, energy intake < or equal to 75% for > or equal to 1 month   Nutrition Interventions:  Interventions: Tube feeding    Estimated body mass index is 19.58 kg/m as calculated from the following:   Height as of this encounter: 6' (1.829 m).   Weight as of this encounter: 65.5 kg.     Code Status: DNR  Family Communication: Discussed with son and daughter-in-law on 10/03/2019  Disposition Plan: Anticipate hospital death versus residential hospice  Consultants:  Cardiology  PCCM  Neurology  Procedures:  Mechanical ventilation  Antimicrobials:  None  DVT prophylaxis: None   Objective: Vitals:   10/03/19 0600 10/03/19 0637 10/03/19 0748 10/03/19 1011  BP: 108/60 (!) 109/58  109/73  Pulse: 100 (!) 114 (!) 116 (!) 124  Resp: (!) 29 (!) 32 (!) 28 (!) 28  Temp: 99.5 F (37.5 C)     TempSrc: Axillary     SpO2: 90% (!) 86% 95% 93%  Weight:      Height:        Intake/Output Summary (Last 24 hours) at 10/03/2019 1619 Last data filed at 10/03/2019 0645 Gross per 24 hour  Intake --  Output 1500 ml  Net -1500 ml   Filed Weights   09/27/19 0300 09/27/19 1912 10/01/2019 1955  Weight: 64.3 kg 65.3 kg 65.5 kg    Exam:  General: NAD, chronically ill-appearing, unresponsive  Cardiovascular: S1, S2 present  Respiratory: Rhonchi noted bilaterally  Abdomen: Soft, nontender, nondistended, bowel sounds present  Musculoskeletal: No bilateral pedal edema noted  Skin: Normal  Psychiatry:  Unable to assess   Data Reviewed: CBC: Recent Labs  Lab 09/29/19 0349 09/30/19 0342 10/01/19 0317 10/02/19 0240 10/03/19 0300  WBC 8.7 11.0* 13.2* 14.2* 15.5*  NEUTROABS 6.8 8.6* 10.8* 11.7* 13.4*  HGB 12.1* 13.3 11.6* 11.5* 10.7*  HCT 36.3* 41.2 35.5* 35.2* 32.8*  MCV 101.1* 103.8* 103.2* 102.0* 101.5*  PLT 243 247 245 235 209   Basic Metabolic Panel: Recent Labs  Lab 09/27/19 0653 10/02/2019 0237 09/30/19 0342 09/30/19 0757  10/01/19 0317 10/02/19 0240 10/03/19 0300  NA 136   < > 143 140 143 144 143  K 3.8   < > 6.0* 4.9 4.9 3.9 3.8  CL 103   < > 110 108 109 110 111  CO2 21*   < > 20* 19* 23 22 22   GLUCOSE 88   < > 132* 128* 141* 123* 141*  BUN 24*   < > 18 17 21 22  29*  CREATININE 1.20   < > 0.94 0.88 1.01 1.07 1.12  CALCIUM 8.3*   < > 8.8* 8.5* 8.5* 8.3* 8.0*  MG 2.3  --   --   --   --   --   --    < > = values in this interval not displayed.   GFR: Estimated Creatinine Clearance: 39.8 mL/min (by C-G formula based on SCr of 1.12 mg/dL). Liver Function Tests: No results for input(s): AST, ALT, ALKPHOS, BILITOT, PROT, ALBUMIN in the last 168 hours. No results for input(s): LIPASE, AMYLASE in the last 168 hours. Recent Labs  Lab 10/01/19 0317  AMMONIA 13   Coagulation Profile: No results for input(s): INR, PROTIME in the last 168 hours. Cardiac Enzymes: No results for input(s): CKTOTAL, CKMB, CKMBINDEX, TROPONINI in the last 168 hours. BNP (last 3 results) No results for input(s): PROBNP in the last 8760 hours. HbA1C: Recent Labs    10/01/19 0317  HGBA1C 5.7*  CBG: No results for input(s): GLUCAP in the last 168 hours. Lipid Profile: No results for input(s): CHOL, HDL, LDLCALC, TRIG, CHOLHDL, LDLDIRECT in the last 72 hours. Thyroid Function Tests: No results for input(s): TSH, T4TOTAL, FREET4, T3FREE, THYROIDAB in the last 72 hours. Anemia Panel: No results for input(s): VITAMINB12, FOLATE, FERRITIN, TIBC, IRON, RETICCTPCT in the last 72 hours. Urine analysis:    Component Value Date/Time   COLORURINE YELLOW 09/30/2019 0449   APPEARANCEUR HAZY (A) 09/30/2019 0449   LABSPEC 1.029 09/30/2019 0449   PHURINE 5.0 09/30/2019 0449   GLUCOSEU NEGATIVE 09/30/2019 0449   HGBUR SMALL (A) 09/30/2019 0449   BILIRUBINUR NEGATIVE 09/30/2019 0449   KETONESUR NEGATIVE 09/30/2019 0449   PROTEINUR 100 (A) 09/30/2019 0449   UROBILINOGEN 0.2 07/12/2013 2230   NITRITE NEGATIVE 09/30/2019 0449    LEUKOCYTESUR NEGATIVE 09/30/2019 0449   Sepsis Labs: @LABRCNTIP (procalcitonin:4,lacticidven:4)  ) Recent Results (from the past 240 hour(s))  Urine culture     Status: Abnormal   Collection Time: 09/24/19  2:11 AM   Specimen: Urine, Catheterized  Result Value Ref Range Status   Specimen Description URINE, CATHETERIZED  Final   Special Requests   Final    NONE Performed at [facility], [address] [place], [place] [address]    Culture >=100,000 COLONIES/mL ESCHERICHIA COLI (A)  Final   Report Status 09/26/2019 FINAL  Final   Organism ID, Bacteria ESCHERICHIA COLI (A)  Final      Susceptibility   Escherichia coli - MIC*    AMPICILLIN >=32 RESISTANT Resistant     CEFAZOLIN >=64 RESISTANT Resistant     CEFTRIAXONE 0.5 SENSITIVE Sensitive     CIPROFLOXACIN >=4 RESISTANT Resistant     GENTAMICIN <=1 SENSITIVE Sensitive     IMIPENEM <=0.25 SENSITIVE Sensitive     NITROFURANTOIN <=16 SENSITIVE Sensitive     TRIMETH/SULFA <=20 SENSITIVE Sensitive     AMPICILLIN/SULBACTAM >=32 RESISTANT Resistant     PIP/TAZO >=128 RESISTANT Resistant     * >=100,000 COLONIES/mL ESCHERICHIA COLI  Blood culture (routine x 2)     Status: None   Collection Time: 09/24/19  3:50 AM   Specimen: BLOOD  Result Value Ref Range Status   Specimen Description BLOOD RIGHT ARM  Final   Special Requests   Final    BOTTLES DRAWN AEROBIC AND ANAEROBIC Blood Culture adequate volume   Culture   Final    NO GROWTH 5 DAYS Performed at [facility], [address] [place], [place] [address]    Report Status 09/29/2019 FINAL  Final  Blood culture (routine x 2)     Status: None   Collection Time: 09/24/19  3:50 AM   Specimen: BLOOD  Result Value Ref Range Status   Specimen Description BLOOD LEFT ARM  Final   Special Requests   Final    BOTTLES DRAWN AEROBIC AND ANAEROBIC Blood Culture adequate volume   Culture   Final    NO GROWTH 5 DAYS Performed at [facility], [place] [address],  [place], [place] [address]    Report Status 09/29/2019 FINAL  Final  SARS CORONAVIRUS 2 (TAT 6-24 HRS) Nasopharyngeal Nasopharyngeal Swab     Status: None   Collection Time: 09/24/19  4:27 AM   Specimen: Nasopharyngeal Swab  Result Value Ref Range Status   SARS Coronavirus 2 NEGATIVE NEGATIVE Final    Comment: (NOTE) SARS-CoV-2 target nucleic acids are NOT DETECTED. The SARS-CoV-2 RNA is generally detectable in upper and  lower respiratory specimens during the acute phase of infection. Negative results do not preclude SARS-CoV-2 infection, do not rule out co-infections with other pathogens, and should not be used as the sole basis for treatment or other patient management decisions. Negative results must be combined with clinical observations, patient history, and epidemiological information. The expected result is Negative. Fact Sheet for Patients: SugarRoll.be Fact Sheet for Healthcare Providers: https://www.woods-mathews.com/ This test is not yet approved or cleared by the [place] FDA and  has been authorized for detection and/or diagnosis of SARS-CoV-2 by FDA under an Emergency Use Authorization (EUA). This EUA will remain  in effect (meaning this test can be used) for the duration of the COVID-19 declaration under Section 56 4(b)(1) of the Act, 21 U.S.C. section 360bbb-3(b)(1), unless the authorization is terminated or revoked sooner. Performed at [facility], [place] [address], [place], [place] [address]   MRSA PCR Screening     Status: None   Collection Time: 09/24/19  9:30 PM   Specimen: Nasopharyngeal  Result Value Ref Range Status   MRSA by PCR NEGATIVE NEGATIVE Final    Comment:        The GeneXpert MRSA Assay (FDA approved for NASAL specimens only), is one component of a comprehensive MRSA colonization surveillance program. It is not intended to diagnose MRSA infection nor to guide or monitor treatment for MRSA  infections. Performed at [facility], [place] [address] [place], [place] [address]       Studies: No results found.  Scheduled Meds: . erythromycin  1 application Left Eye QID  . sodium chloride flush  3 mL Intravenous Q12H    Continuous Infusions: . sodium chloride    . morphine 2 mg/hr (10/03/19 1500)     LOS: 9 days     Alma Friendly, MD [facility]  If 7PM-7AM, please contact night-coverage [email or web address] 10/03/2019, 4:19 PM

## 2019-10-03 NOTE — Care Management Important Message (Signed)
Important Message  Patient Details  Name: FHER WYNDHAM MRN: [id number] Date of Birth: [birth year]   Medicare Important Message Given:  Yes     Shelda Altes 10/03/2019, 9:48 AM

## 2019-10-03 NOTE — Progress Notes (Signed)
Received call from RN that Cameron [name] continues to have uncontrolled symptoms.    Goal is for comfort.  MAR reviewed and discussed symptoms with RN.  Tachypnic with increased work of breathing.  Increased prn morphine and ativan.  Appreciate bedside care team greatly.  Micheline Rough, MD [place] Team [phone]

## 2019-10-03 NOTE — Progress Notes (Signed)
I received a call from the nurse to provide spiritual care for the patient's family. I visited the patient's room with his son present. I provided spiritual care by reading scripture, leading in prayer, and by sharing words of encouragement. I shared that the Chaplain is available to provide additional support as needed or requested.    10/03/19 0800  Clinical Encounter Type  Visited With Patient and family together  Visit Type Spiritual support  Referral From Nurse  Consult/Referral To Chaplain  Spiritual Encounters  Spiritual Needs Prayer    Chaplain Dr Redgie Grayer

## 2019-10-28 NOTE — Progress Notes (Signed)
Chaplain responded to page at request of family for Patrick at the death of their father.  Found the son and two sisters at patient's bedside.  Established a relationship of care and concern.  Read scripture and prayed at bedside. Encouraged children to tell stories of their beloved dad.  Departed with invitation to have Chaplain paged if family needed additional support.  De Burrs Chaplain Resident

## 2019-10-28 NOTE — Progress Notes (Signed)
Remaining morphine 30 ml wasted with Fransico Michael, RN.

## 2019-10-28 NOTE — Progress Notes (Signed)
Pt expired at 0055 hours 11-01-19. Death was anticipated and pt was a DNR and comfort care only. Pronounced by 2 RNs. Death certificate completed.  KJKG, NP [facility]

## 2019-10-28 NOTE — Progress Notes (Addendum)
Patient pronounce dead at 0055 am, Cvg MD paged.  family updated and came to bedside around 0130am and left at 41am. Chaplain paged to offer support to the family. This RN offer emotional support, therapeutic presence to family. No belonging remain at bedside. Body taken to the morgue at around 0310am after post mortem care.

## 2019-10-28 NOTE — Death Summary Note (Signed)
DEATH SUMMARY   Patient Details  Name: Cameron [name] MRN: [id number] DOB: March 01, 1926  Admission/Discharge Information   Admit Date:  10/10/19  Date of Death: Date of Death: 10-21-2019  Time of Death: Time of Death: 2053/10/06  Length of Stay: Sep 23, 2022  Referring Physician: Lavone Orn, MD   Reason(s) for Hospitalization  Acute encephalopathy  Diagnoses  Preliminary cause of death: NSTEMI complicated by polymorphic ventricular tachycardia Secondary Diagnoses (including complications and co-morbidities):  Principal Problem:   Cardiac arrest [facility]) Active Problems:   Persistent atrial fibrillation ([place])   Chronic diastolic HF (heart failure) (HCC)   Dementia (HCC)   Hyponatremia   Acute metabolic encephalopathy   Leukocytosis   Hypothyroidism   UTI (urinary tract infection)   Palliative care by specialist   Goals of care, counseling/discussion   DNR (do not resuscitate)   Brief Hospital Course (including significant findings, care, treatment, and services provided and events leading to death)  86 year old man with history of HFpEF, COPD, atrial fibrillation on Xarelto, CAD, pulmonary fibrosis, GERD, hypothyroidism, recent hospitalization from 3/10-3/18 for facial cellulitis and hyponatremia presenting again with acute encephalopathy. He has been having worse lethargy over the past few days. There have been some reported falls at the facility. Upon arrival to [facility] ED, he was found to have a UTI and started on antibiotics. On evening of 3/28 he was found to be in pulseless Vtach. CPR was initiated. He was defibrillated x1. ROSC was achieved after 4 minutes. He had agonal breathing and was intubated extubated on 09/26/2019. Was previously on Lexapro, however given increase in Qtc >500 and Vtach, Lexapro was discontinued. Repeat EKG revealed Qtc of ~480s. Home Donepezil restarted on 3/29 and am QTc was 498. Held home Donepezil on 3/30 and 3/31 am QTc was 440.  Patient was  subsequently stabilized.  [facility] hospitalist assumed care on 09/27/2019. Below is the course of hospitalization  Cardiac arrest/torsades Noted to have V. tach with defibrillation, prolonged QTC Subsequently resuscitated, Intubated on 3/28, extubated on [id number]  NSTEMI complicated by polymorphic ventricular tachycardia/history of CAD/ischemic cardiomyopathy Echo done on 09/25/2019 showed EF of 35 to 40%, with regional wall motion abnormalities Cardiology consulted status post LHC on 10/03/2019 which showed 99% proximal dominant circumflex status post PCI stent  Acute metabolic encephalopathy/delirium History of underlying dementia  ??  HCAP/aspiration pneumonia, Vs acute infarct UA unremarkable (recently completed treatment for UTI) Chest x-ray showed possible interval development of mild pulmonary edema with background scarring/fibrosis.  Superimposed infection or aspiration could be considered, repeat chest x-ray showed bilateral pneumonia S/p cefepime, metronidazole  Acute hypoxic respiratory failure Likely 2/2 HCAP/aspiration pneumonia Intubated on 3/28, extubated on 3/30 Management as above  Acute infarct/CVA-likely embolic CT head unremarkable MRI showed- 4 punctate infarcts at right MCA territory Likely related to cardiac cath procedure or A. fib Neurology consulted- no further recs   UTI UC grew E. Coli, sensitive to ceftriaxone Completed 7 days of ceftriaxone on 4/2  Paroxysmal A. Fib S/P metoprolol and Eliquis  Normocytic chronic anemia Hemoglobin baseline around 12  History of COPD  Hypothyroidism  BPH  Dementia  GOC discussion Patient with very poor prognosis, advanced age, history of dementia, DNR status Palliative care consulted, transitioned to comfort care measures    Pertinent Labs and Studies  Significant Diagnostic Studies CT ANGIO HEAD W OR WO CONTRAST  Result Date: 10/01/2019 CLINICAL DATA:  Stroke follow-up EXAM: CT ANGIOGRAPHY HEAD AND  NECK TECHNIQUE: Multidetector CT imaging of the head and neck was performed  using the standard protocol during bolus administration of intravenous contrast. Multiplanar CT image reconstructions and MIPs were obtained to evaluate the vascular anatomy. Carotid stenosis measurements (when applicable) are obtained utilizing NASCET criteria, using the distal internal carotid diameter as the denominator. CONTRAST:  92mL OMNIPAQUE IOHEXOL 350 MG/ML SOLN COMPARISON:  Brain MRI 09/30/2019 FINDINGS: CT HEAD FINDINGS Brain: There is no mass, hemorrhage or extra-axial collection. The size and configuration of the ventricles and extra-axial CSF spaces are normal. There is hypoattenuation of the periventricular white matter, most commonly indicating chronic ischemic microangiopathy. Skull: The visualized skull base, calvarium and extracranial soft tissues are normal. Sinuses/Orbits: No fluid levels or advanced mucosal thickening of the visualized paranasal sinuses. No mastoid or middle ear effusion. The orbits are normal. CTA NECK FINDINGS SKELETON: There is no bony spinal canal stenosis. No lytic or blastic lesion. OTHER NECK: Normal pharynx, larynx and major salivary glands. No cervical lymphadenopathy. Unremarkable thyroid gland. UPPER CHEST: Severe bilateral interstitial lung disease with small pleural effusions. AORTIC ARCH: There is mild calcific atherosclerosis of the aortic arch. There is no aneurysm, dissection or hemodynamically significant stenosis of the visualized portion of the aorta. Conventional 3 vessel aortic branching pattern. The visualized proximal subclavian arteries are widely patent. RIGHT CAROTID SYSTEM: No dissection, occlusion or aneurysm. Mild atherosclerotic calcification at the carotid bifurcation without hemodynamically significant stenosis. LEFT CAROTID SYSTEM: No dissection, occlusion or aneurysm. There is predominantly calcified atherosclerosis extending into the proximal ICA, resulting in 50%  stenosis. VERTEBRAL ARTERIES: Left dominant configuration. Both origins are clearly patent. There is no dissection, occlusion or flow-limiting stenosis to the skull base (V1-V3 segments). CTA HEAD FINDINGS POSTERIOR CIRCULATION: --Vertebral arteries: Normal V4 segments. --Posterior inferior cerebellar arteries (PICA): Patent origins from the vertebral arteries. --Anterior inferior cerebellar arteries (AICA): Patent origins from the basilar artery. --Basilar artery: Normal. --Superior cerebellar arteries: Normal. --Posterior cerebral arteries: Normal. Both originate from the basilar artery. Posterior communicating arteries (p-comm) are diminutive or absent. ANTERIOR CIRCULATION: --Intracranial internal carotid arteries: Atherosclerotic calcification of the internal carotid arteries at the skull base without hemodynamically significant stenosis. --Anterior cerebral arteries (ACA): Normal. Both A1 segments are present. Patent anterior communicating artery (a-comm). --Middle cerebral arteries (MCA): Normal. VENOUS SINUSES: As permitted by contrast timing, patent. ANATOMIC VARIANTS: None Review of the MIP images confirms the above findings. IMPRESSION: 1. No emergent large vessel occlusion. 2. 50% stenosis of the proximal left ICA secondary to predominantly calcified atherosclerosis. 3. Severe interstitial lung disease with small pleural effusions. Electronically Signed   By: Ulyses Jarred M.D.   On: 10/01/2019 04:17   DG Abd 1 View  Result Date: 09/24/2019 CLINICAL DATA:  Enteric catheter placement EXAM: ABDOMEN - 1 VIEW COMPARISON:  09/24/2019 FINDINGS: Frontal view of the lower chest and upper abdomen demonstrates enteric catheter tip projecting over gastric fundus. Side port projects at the gastroesophageal junction. Lung bases are clear. External defibrillator pads are noted. IMPRESSION: 1. Enteric catheter tip projecting over gastric fundus. Electronically Signed   By: Randa Ngo M.D.   On: 09/24/2019 23:48    CT HEAD WO CONTRAST  Result Date: 09/30/2019 CLINICAL DATA:  Encephalopathy. History of COPD, atrial fibrillation on Xarelto, coronary artery disease, pulmonary fibrosis. Recent hospitalization for facial cellulitis and hyponatremia. EXAM: CT HEAD WITHOUT CONTRAST TECHNIQUE: Contiguous axial images were obtained from the base of the skull through the vertex without intravenous contrast. COMPARISON:  Head CT dated 09/22/2019. FINDINGS: Brain: Generalized age related parenchymal volume loss with commensurate dilatation of the ventricles and sulci.  Chronic small vessel ischemic changes again noted within the bilateral periventricular and subcortical white matter regions. No mass, hemorrhage, edema or other evidence of acute parenchymal abnormality. No extra-axial hemorrhage. Vascular: Chronic calcified atherosclerotic changes of the large vessels at the skull base. No unexpected hyperdense vessel. Skull: Normal. Negative for fracture or focal lesion. Sinuses/Orbits: No acute finding. Other: None. IMPRESSION: 1. No acute findings. No intracranial mass, hemorrhage or edema. 2. Chronic small vessel ischemic changes in the white matter. Electronically Signed   By: Franki Cabot M.D.   On: 09/30/2019 10:41   CT ANGIO NECK W OR WO CONTRAST  Result Date: 10/01/2019 CLINICAL DATA:  Stroke follow-up EXAM: CT ANGIOGRAPHY HEAD AND NECK TECHNIQUE: Multidetector CT imaging of the head and neck was performed using the standard protocol during bolus administration of intravenous contrast. Multiplanar CT image reconstructions and MIPs were obtained to evaluate the vascular anatomy. Carotid stenosis measurements (when applicable) are obtained utilizing NASCET criteria, using the distal internal carotid diameter as the denominator. CONTRAST:  83mL OMNIPAQUE IOHEXOL 350 MG/ML SOLN COMPARISON:  Brain MRI 09/30/2019 FINDINGS: CT HEAD FINDINGS Brain: There is no mass, hemorrhage or extra-axial collection. The size and configuration  of the ventricles and extra-axial CSF spaces are normal. There is hypoattenuation of the periventricular white matter, most commonly indicating chronic ischemic microangiopathy. Skull: The visualized skull base, calvarium and extracranial soft tissues are normal. Sinuses/Orbits: No fluid levels or advanced mucosal thickening of the visualized paranasal sinuses. No mastoid or middle ear effusion. The orbits are normal. CTA NECK FINDINGS SKELETON: There is no bony spinal canal stenosis. No lytic or blastic lesion. OTHER NECK: Normal pharynx, larynx and major salivary glands. No cervical lymphadenopathy. Unremarkable thyroid gland. UPPER CHEST: Severe bilateral interstitial lung disease with small pleural effusions. AORTIC ARCH: There is mild calcific atherosclerosis of the aortic arch. There is no aneurysm, dissection or hemodynamically significant stenosis of the visualized portion of the aorta. Conventional 3 vessel aortic branching pattern. The visualized proximal subclavian arteries are widely patent. RIGHT CAROTID SYSTEM: No dissection, occlusion or aneurysm. Mild atherosclerotic calcification at the carotid bifurcation without hemodynamically significant stenosis. LEFT CAROTID SYSTEM: No dissection, occlusion or aneurysm. There is predominantly calcified atherosclerosis extending into the proximal ICA, resulting in 50% stenosis. VERTEBRAL ARTERIES: Left dominant configuration. Both origins are clearly patent. There is no dissection, occlusion or flow-limiting stenosis to the skull base (V1-V3 segments). CTA HEAD FINDINGS POSTERIOR CIRCULATION: --Vertebral arteries: Normal V4 segments. --Posterior inferior cerebellar arteries (PICA): Patent origins from the vertebral arteries. --Anterior inferior cerebellar arteries (AICA): Patent origins from the basilar artery. --Basilar artery: Normal. --Superior cerebellar arteries: Normal. --Posterior cerebral arteries: Normal. Both originate from the basilar artery.  Posterior communicating arteries (p-comm) are diminutive or absent. ANTERIOR CIRCULATION: --Intracranial internal carotid arteries: Atherosclerotic calcification of the internal carotid arteries at the skull base without hemodynamically significant stenosis. --Anterior cerebral arteries (ACA): Normal. Both A1 segments are present. Patent anterior communicating artery (a-comm). --Middle cerebral arteries (MCA): Normal. VENOUS SINUSES: As permitted by contrast timing, patent. ANATOMIC VARIANTS: None Review of the MIP images confirms the above findings. IMPRESSION: 1. No emergent large vessel occlusion. 2. 50% stenosis of the proximal left ICA secondary to predominantly calcified atherosclerosis. 3. Severe interstitial lung disease with small pleural effusions. Electronically Signed   By: Ulyses Jarred M.D.   On: 10/01/2019 04:17   MR BRAIN WO CONTRAST  Result Date: 09/30/2019 CLINICAL DATA:  Encephalopathy EXAM: MRI HEAD WITHOUT CONTRAST TECHNIQUE: Multiplanar, multiecho pulse sequences of the brain and  surrounding structures were obtained without intravenous contrast. COMPARISON:  2018 FINDINGS: Brain: Few scattered punctate foci of reduced diffusion in the right frontoparietal white matter as well as the right lentiform nucleus. There is no evidence of intracranial hemorrhage. Patchy and confluent T2 hyperintensity in the supratentorial much greater than pontine white matter is nonspecific but probably reflects moderate chronic microvascular ischemic changes. Prominence of the ventricles and sulci reflects generalized parenchymal volume loss. There is no intracranial mass, mass effect, or edema. There is no hydrocephalus or extra-axial fluid collection. Vascular: Diminished proximal right intracranial vertebral artery flow void also present on the prior study. Major vessel flow voids at the skull base are otherwise preserved. Skull and upper cervical spine: Normal marrow signal is preserved. Sinuses/Orbits: Trace  mucosal thickening. Bilateral lens replacements. Other: Sella is partially empty.  Mastoid air cells are clear. IMPRESSION: Few (less than 10) punctate acute infarcts in the right MCA territory. Moderate chronic microvascular ischemic changes. Electronically Signed   By: Macy Mis M.D.   On: 09/30/2019 18:53   CARDIAC CATHETERIZATION  Result Date: 10/12/2019  Non-ST elevation myocardial infarction complicated by polymorphic ventricular tachycardia/cardiac arrest.  Successful resuscitation.  99% proximal dominant circumflex with reduced TIMI flow (grade II -III) was treated with PCI and stent reducing the stenosis to 0% with TIMI grade III flow (18 x 2.5 mm Onyx postdilated to 3.0 mm in diameter.  Separate left coronary ostia.  Proximal LAD contains eccentric 50% stenosis.  Circumflex is as noted above.  Right coronary is nondominant.  There is ostial 50% narrowing.  Inferobasal akinesis extending to the mid inferior wall.  EF 40 to 45%.  LVEDP 10 mmHg. Recommendation:  Aspirin, reduced dose Xarelto, and Plavix for 2 weeks then drop aspirin.  Combination of Xarelto and Plavix should be for 12 months.  Further management per team.  2 hours of Aggrastat to allow antiplatelet effect until absorption of Plavix.  DG CHEST PORT 1 VIEW  Result Date: 10/01/2019 CLINICAL DATA:  Tachypnea and constipation EXAM: PORTABLE CHEST 1 VIEW COMPARISON:  09/24/2019 FINDINGS: Bilateral airspace disease which is extensive. Opacity is mildly increased on the left. Chronic cardiomegaly with stable aortic contours. No visible effusion or pneumothorax. IMPRESSION: Bilateral pneumonia with mild progression on the left since prior Electronically Signed   By: Monte Fantasia M.D.   On: 10/01/2019 09:01   DG CHEST PORT 1 VIEW  Result Date: 09/29/2019 CLINICAL DATA:  Tachypnea EXAM: PORTABLE CHEST 1 VIEW COMPARISON:  09/24/2019 FINDINGS: Single frontal view of the chest demonstrates interval extubation and removal of  enteric catheter. Cardiac silhouette is enlarged but stable. Background scarring and fibrosis is again noted. Bilateral ground-glass airspace disease has developed, greatest throughout the right lung and left lung base. There are small bilateral pleural effusions. No pneumothorax. IMPRESSION: 1. Favor interval development of mild pulmonary edema on background scarring and fibrosis. Superimposed infection or aspiration could be considered in the appropriate clinical setting. Electronically Signed   By: Randa Ngo M.D.   On: 09/29/2019 20:22   DG Chest Portable 1 View  Addendum Date: 09/24/2019   ADDENDUM REPORT: 09/24/2019 23:19 ADDENDUM: There is an enteric catheter passing below the diaphragm, tip excluded by collimation. Side port projects just above the gastroesophageal junction. Findings were discussed with the patient's nurse at 11:12 p.m. Electronically Signed   By: Randa Ngo M.D.   On: 09/24/2019 23:19   Result Date: 09/24/2019 CLINICAL DATA:  Code blue, intubated EXAM: PORTABLE CHEST 1 VIEW COMPARISON:  09/24/2019  FINDINGS: Single frontal view of the chest demonstrates external defibrillator pads. Endotracheal tube overlies tracheal air column tip midway between thoracic inlet and carina. Cardiac silhouette is stable. Stable areas of scarring and fibrosis throughout the lungs. No airspace disease, effusion, or pneumothorax. No acute bony abnormalities. IMPRESSION: 1. No complication after intubation. 2. Pulmonary scarring and fibrosis. No superimposed airspace disease. Electronically Signed: By: Randa Ngo M.D. On: 09/24/2019 22:17   DG Abd Portable 1V  Result Date: 10/01/2019 CLINICAL DATA:  Shortness of breath EXAM: PORTABLE ABDOMEN - 1 VIEW COMPARISON:  09/24/2019 FINDINGS: Normal bowel gas pattern. Extensive high-density in the bladder from recent CTA head neck. This shows prominent trabeculation of the bladder with cellules or diverticula. Pulmonary findings as described on  dedicated chest x-ray .  Cholelithiasis IMPRESSION: 1. Normal bowel gas pattern. 2. Findings of chronic urinary outlet obstruction. The bladder is not over distended currently. 3. Cholelithiasis. Electronically Signed   By: Monte Fantasia M.D.   On: 10/01/2019 09:03   DG Swallowing Func-Speech Pathology  Result Date: 10/01/2019 Objective Swallowing Evaluation: Type of Study: MBS-Modified Barium Swallow Study  Patient Details Name: LORON BRAZILE MRN: [id number] Date of Birth: 1926/06/09 Today's Date: 10/01/2019 Time: SLP Start Time (ACUTE ONLY): 1237 -SLP Stop Time (ACUTE ONLY): 1250 SLP Time Calculation (min) (ACUTE ONLY): 13 min Past Medical History: Past Medical History: Diagnosis Date . Arthritis  . CHF (congestive heart failure) ([place])  . COPD (chronic obstructive pulmonary disease) ([place])  . Dysrhythmia   atrial fibrilation . GERD (gastroesophageal reflux disease)  . Pulmonary fibrosis ([place])  Past Surgical History: Past Surgical History: Procedure Laterality Date . CORONARY STENT INTERVENTION N/A 10/05/2019  Procedure: CORONARY STENT INTERVENTION;  Surgeon: Belva Crome, MD;  Location: [place] [facility];  Service: Cardiovascular;  Laterality: N/A; . hemmroidectmy   . HERNIA REPAIR   . LEFT HEART CATH AND CORONARY ANGIOGRAPHY N/A 10/07/2019  Procedure: LEFT HEART CATH AND CORONARY ANGIOGRAPHY;  Surgeon: Belva Crome, MD;  Location: [place] [facility];  Service: Cardiovascular;  Laterality: N/A; . LEFT HEART CATHETERIZATION WITH CORONARY ANGIOGRAM N/A 10/07/2012  Procedure: LEFT HEART CATHETERIZATION WITH CORONARY ANGIOGRAM, possible PCI;  Surgeon: Jettie Booze, MD;  Location: [facility];  Service: Cardiovascular;  Laterality: N/A; . TONSILLECTOMY   HPI: Pt is a 86 year old male with past medical history of GERD, COPD, chronic Afib, hiatal hernia, and diastolic CHF  who presented to the ED after exhibiting a several day history of progressively worsening lethargy and confusion diagnosed with acute  encephalopathy and found to have UTI. Vtach arrest on 3/28; CPR 4 mins; ETT 3/28-3/30 at 11:20. MRI brain 4/3: Few (less than 10) punctate acute infarcts in the right MCA territory.  No data recorded Assessment / Plan / Recommendation CHL IP CLINICAL IMPRESSIONS 10/01/2019 Clinical Impression Pt presents with moderate oropharyngeal dysphagia characterized by prolonged mastication, impaired bolus cohesion, a pharyngeal delay, reduced lingual retraction, and reduced anterior laryngeal movement. He demonstrated premature spillage to the level of the valleculae and pyriform sinuses, lngual residue, vallecular residue, pyriform sinus residue, intermittent incomplete epiglottic retroversion, and aspiration (PAS 7,8) of thin liquids. Coughing was ineffective in expelling aspirate from the larynx. Pt was unable to effectively and consistently demonstrate postural modifications despite models or verbal and tactile cues. A dysphagia 1 (puree) diet with nectar thick liquids is recommended at this time. SLP will follow for dysphagia tx as clinically indicated pending decision regarding goals of care.  SLP Visit Diagnosis Dysphagia, oropharyngeal phase (  R13.12) Attention and concentration deficit following -- Frontal lobe and executive function deficit following -- Impact on safety and function Mild aspiration risk;Moderate aspiration risk   CHL IP TREATMENT RECOMMENDATION 10/01/2019 Treatment Recommendations Therapy as outlined in treatment plan below   Prognosis 10/01/2019 Prognosis for Safe Diet Advancement Fair Barriers to Reach Goals Cognitive deficits;Severity of deficits Barriers/Prognosis Comment -- CHL IP DIET RECOMMENDATION 10/01/2019 SLP Diet Recommendations Dysphagia 1 (Puree) solids;Nectar thick liquid Liquid Administration via Straw;Cup Medication Administration Crushed with puree Compensations Slow rate;Small sips/bites;Follow solids with liquid Postural Changes Seated upright at 90 degrees;Remain semi-upright after after  feeds/meals (Comment)   No flowsheet data found.  CHL IP FOLLOW UP RECOMMENDATIONS 10/01/2019 Follow up Recommendations Other (comment)   CHL IP FREQUENCY AND DURATION 10/01/2019 Speech Therapy Frequency (ACUTE ONLY) min 2x/week Treatment Duration 2 weeks      CHL IP ORAL PHASE 10/01/2019 Oral Phase Impaired Oral - Pudding Teaspoon -- Oral - Pudding Cup -- Oral - Honey Teaspoon -- Oral - Honey Cup -- Oral - Nectar Teaspoon -- Oral - Nectar Cup -- Oral - Nectar Straw Premature spillage;Decreased bolus cohesion Oral - Thin Teaspoon -- Oral - Thin Cup -- Oral - Thin Straw Premature spillage;Decreased bolus cohesion Oral - Puree Premature spillage;Decreased bolus cohesion;Lingual/palatal residue Oral - Mech Soft -- Oral - Regular Decreased bolus cohesion;Lingual/palatal residue;Impaired mastication Oral - Multi-Consistency -- Oral - Pill -- Oral Phase - Comment --  CHL IP PHARYNGEAL PHASE 10/01/2019 Pharyngeal Phase Impaired Pharyngeal- Pudding Teaspoon -- Pharyngeal -- Pharyngeal- Pudding Cup -- Pharyngeal -- Pharyngeal- Honey Teaspoon -- Pharyngeal -- Pharyngeal- Honey Cup -- Pharyngeal -- Pharyngeal- Nectar Teaspoon -- Pharyngeal -- Pharyngeal- Nectar Cup -- Pharyngeal -- Pharyngeal- Nectar Straw Delayed swallow initiation-vallecula;Delayed swallow initiation-pyriform sinuses;Pharyngeal residue - pyriform;Pharyngeal residue - valleculae;Reduced tongue base retraction;Reduced anterior laryngeal mobility Pharyngeal -- Pharyngeal- Thin Teaspoon -- Pharyngeal -- Pharyngeal- Thin Cup -- Pharyngeal -- Pharyngeal- Thin Straw Delayed swallow initiation-vallecula;Delayed swallow initiation-pyriform sinuses;Pharyngeal residue - pyriform;Pharyngeal residue - valleculae;Reduced tongue base retraction;Reduced anterior laryngeal mobility;Penetration/Aspiration during swallow Pharyngeal Material enters airway, passes BELOW cords and not ejected out despite cough attempt by patient;Material enters airway, passes BELOW cords without attempt  by patient to eject out (silent aspiration) Pharyngeal- Puree Pharyngeal residue - pyriform;Pharyngeal residue - valleculae;Reduced tongue base retraction;Reduced anterior laryngeal mobility;Delayed swallow initiation-vallecula Pharyngeal -- Pharyngeal- Mechanical Soft -- Pharyngeal -- Pharyngeal- Regular Delayed swallow initiation-vallecula;Pharyngeal residue - pyriform;Pharyngeal residue - valleculae;Reduced tongue base retraction;Reduced anterior laryngeal mobility Pharyngeal -- Pharyngeal- Multi-consistency -- Pharyngeal -- Pharyngeal- Pill -- Pharyngeal -- Pharyngeal Comment --  CHL IP CERVICAL ESOPHAGEAL PHASE 10/01/2019 Cervical Esophageal Phase WFL Pudding Teaspoon -- Pudding Cup -- Honey Teaspoon -- Honey Cup -- Nectar Teaspoon -- Nectar Cup -- Nectar Straw -- Thin Teaspoon -- Thin Cup -- Thin Straw -- Puree -- Mechanical Soft -- Regular -- Multi-consistency -- Pill -- Cervical Esophageal Comment -- Shanika I. [name], [place], [place] Office number [phone] Pager [phone] [name] 10/01/2019, 2:31 PM              ECHOCARDIOGRAM COMPLETE  Result Date: 09/25/2019    ECHOCARDIOGRAM REPORT   Patient Name:   DEMITRIUS PEDICINI Date of Exam: 09/25/2019 Medical Rec #:  [id number]       Height:       70.5 in Accession #:    [id number]      Weight:       150.4 lb Date of Birth:  05/01/1926      BSA:  1.859 m Patient Age:    86 years        BP:           100/59 mmHg Patient Gender: M               HR:           66 bpm. Exam Location:  Inpatient Procedure: 2D Echo, Cardiac Doppler and Color Doppler Indications:    Cardiac arrest I46.9  History:        Patient has prior history of Echocardiogram examinations, most                 recent 10/05/2012. CHF, Arrythmias:Atrial Fibrillation and Cardiac                 Arrest, Signs/Symptoms:Hypotension; Risk Factors:Former Smoker.  Sonographer:    Vickie Epley RDCS Referring Phys: [id number] Milagros Loll IMPRESSIONS  1. Left  ventricular ejection fraction, by estimation, is 35 to 40%. The left ventricle has moderately decreased function. The left ventricle demonstrates regional wall motion abnormalities (see scoring diagram/findings for description). Inferior/inferolateral akinesis. Left ventricular diastolic parameters are indeterminate.  2. Right ventricular systolic function is mildly reduced. The right ventricular size is mildly enlarged.  3. Right atrial size was severely dilated.  4. The mitral valve is normal in structure. Moderate mitral valve regurgitation.  5. The tricuspid valve is abnormal. Tricuspid valve regurgitation is moderate.  6. The aortic valve is tricuspid. Aortic valve regurgitation is mild to moderate. Mild aortic valve sclerosis is present, with no evidence of aortic valve stenosis. FINDINGS  Left Ventricle: Left ventricular ejection fraction, by estimation, is 35 to 40%. The left ventricle has moderately decreased function. The left ventricle demonstrates regional wall motion abnormalities. The left ventricular internal cavity size was normal in size. There is no left ventricular hypertrophy. Left ventricular diastolic parameters are indeterminate.  LV Wall Scoring: The inferior wall and posterior wall are akinetic. The entire anterior wall, antero-lateral wall, entire septum, and entire apex are normal. Right Ventricle: The right ventricular size is mildly enlarged. Right vetricular wall thickness was not assessed. Right ventricular systolic function is mildly reduced. There is mildly elevated pulmonary artery systolic pressure. The tricuspid regurgitant velocity is 2.74 m/s, and with an assumed right atrial pressure of 8 mmHg, the estimated right ventricular systolic pressure is [id number] mmHg. Left Atrium: Left atrial size was normal in size. Right Atrium: Right atrial size was severely dilated. Pericardium: Trivial pericardial effusion is present. Mitral Valve: The mitral valve is normal in structure. Moderate  mitral valve regurgitation. Tricuspid Valve: The tricuspid valve is abnormal. Tricuspid valve regurgitation is moderate. Aortic Valve: The aortic valve is tricuspid. Aortic valve regurgitation is mild to moderate. Aortic regurgitation PHT measures 640 msec. Mild aortic valve sclerosis is present, with no evidence of aortic valve stenosis. Pulmonic Valve: The pulmonic valve was not well visualized. Pulmonic valve regurgitation is trivial. Aorta: The aortic root is normal in size and structure. Venous: IVC assessment for right atrial pressure unable to be performed due to mechanical ventilation. IAS/Shunts: No atrial level shunt detected by color flow Doppler.  LEFT VENTRICLE PLAX 2D LVIDd:         5.40 cm      Diastology LVIDs:         4.40 cm      LV e' lateral: 3.94 cm/s LV PW:         0.80 cm      LV e' medial:  6.49  cm/s LV IVS:        0.80 cm LVOT diam:     2.10 cm LV SV:         34 LV SV Index:   18 LVOT Area:     3.46 cm  LV Volumes (MOD) LV vol d, MOD A2C: 111.0 ml LV vol d, MOD A4C: 126.0 ml LV vol s, MOD A2C: 70.3 ml LV vol s, MOD A4C: 76.6 ml LV SV MOD A2C:     40.7 ml LV SV MOD A4C:     126.0 ml LV SV MOD BP:      50.4 ml RIGHT VENTRICLE RV S prime:     9.00 cm/s TAPSE (M-mode): 1.9 cm LEFT ATRIUM           Index       RIGHT ATRIUM           Index LA diam:      3.90 cm 2.10 cm/m  RA Area:     31.70 cm LA Vol (A2C): 41.7 ml 22.43 ml/m RA Volume:   114.00 ml 61.33 ml/m LA Vol (A4C): 48.1 ml 25.88 ml/m  AORTIC VALVE LVOT Vmax:   58.70 cm/s LVOT Vmean:  36.200 cm/s LVOT VTI:    0.098 m AI PHT:      640 msec  AORTA Ao Root diam: 3.20 cm MR Peak grad:    79.7 mmHg   TRICUSPID VALVE MR Mean grad:    55.0 mmHg   TR Peak grad:   30.0 mmHg MR Vmax:         446.50 cm/s TR Vmax:        274.00 cm/s MR Vmean:        352.0 cm/s MR PISA:         0.57 cm    SHUNTS MR PISA Eff ROA: 5 mm       Systemic VTI:  0.10 m MR PISA Radius:  0.30 cm     Systemic Diam: 2.10 cm Oswaldo Milian MD Electronically signed by  Oswaldo Milian MD Signature Date/Time: 09/25/2019/1:26:45 PM    Final     Microbiology No results found for this or any previous visit (from the past 240 hour(s)).  Lab Basic Metabolic Panel: No results for input(s): NA, K, CL, CO2, GLUCOSE, BUN, CREATININE, CALCIUM, MG, PHOS in the last 168 hours. Liver Function Tests: No results for input(s): AST, ALT, ALKPHOS, BILITOT, PROT, ALBUMIN in the last 168 hours. No results for input(s): LIPASE, AMYLASE in the last 168 hours. No results for input(s): AMMONIA in the last 168 hours. CBC: No results for input(s): WBC, NEUTROABS, HGB, HCT, MCV, PLT in the last 168 hours. Cardiac Enzymes: No results for input(s): CKTOTAL, CKMB, CKMBINDEX, TROPONINI in the last 168 hours. Sepsis Labs: No results for input(s): PROCALCITON, WBC, LATICACIDVEN in the last 168 hours.  Procedures/Operations  LHC Mechanical ventilation   Alma Friendly 10/24/2019, 4:43 PM

## 2019-10-28 DEATH — deceased

## 2020-07-19 IMAGING — CT CT HEAD W/O CM
4 series · 17 of 47 positions shown, 19 images · non-contrast
Comparison: Head CT dated 09/23/2019.

CLINICAL DATA: Encephalopathy. History of COPD, atrial fibrillation
on Xarelto, coronary artery disease, pulmonary fibrosis. Recent
hospitalization for facial cellulitis and hyponatremia.

EXAM:
CT HEAD WITHOUT CONTRAST
TECHNIQUE: Contiguous axial images were obtained from the base of the skull
through the vertex without intravenous contrast.

[Series 3: head (person_name) · axial · 0.45mm/px · z∈[-112,+18]mm · 7 of 36 slices shown, 9 images]
[im 5/36  brain]
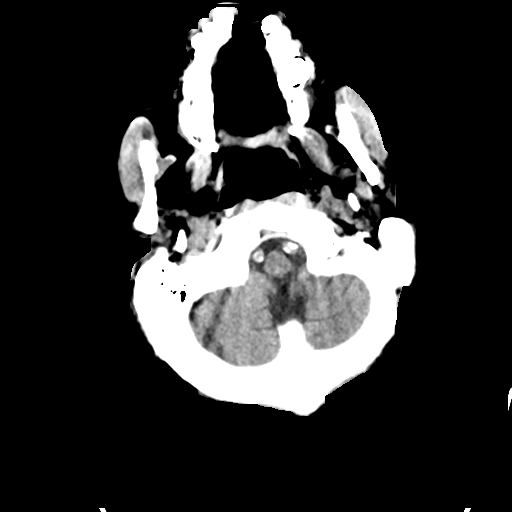
[im 5/36  bone]
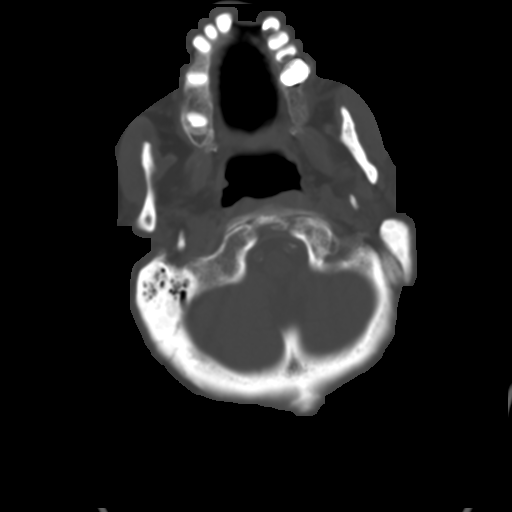
[im 9/36  brain]
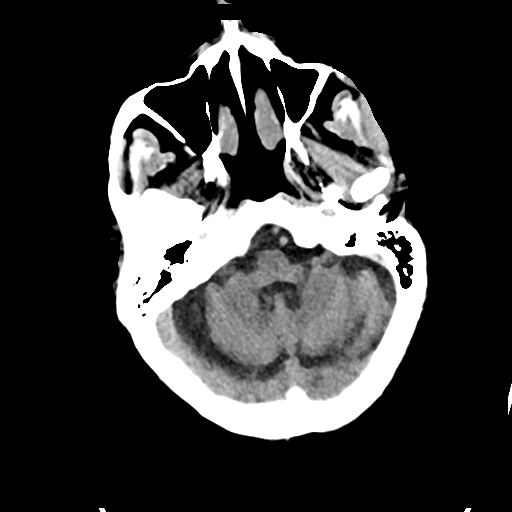
[im 14/36  brain]
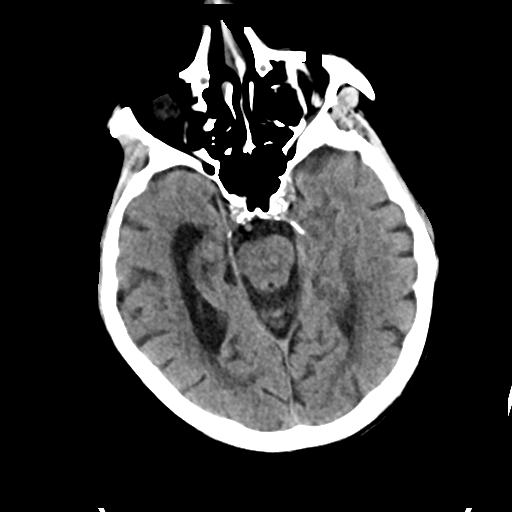
[im 18/36  brain]
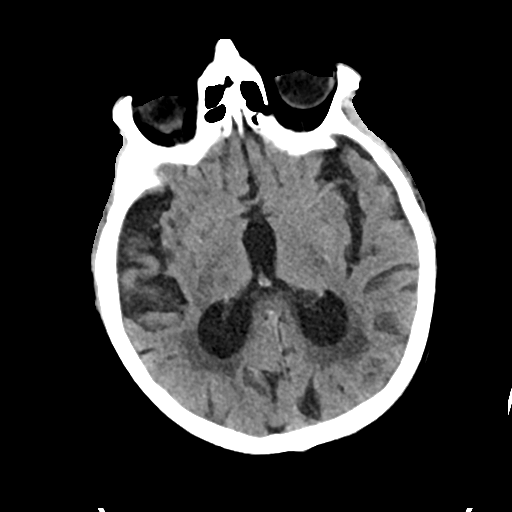
[im 22/36  brain]
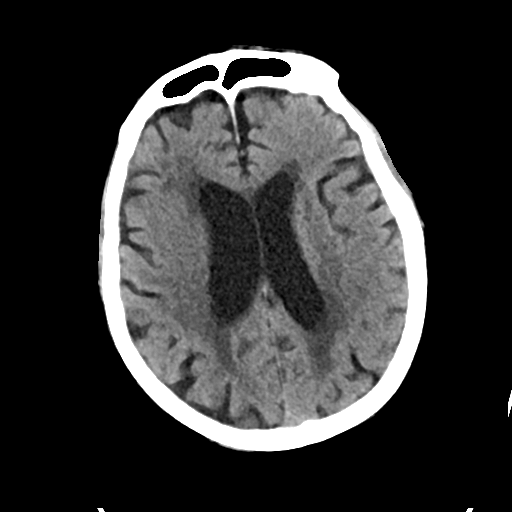
[im 22/36  bone]
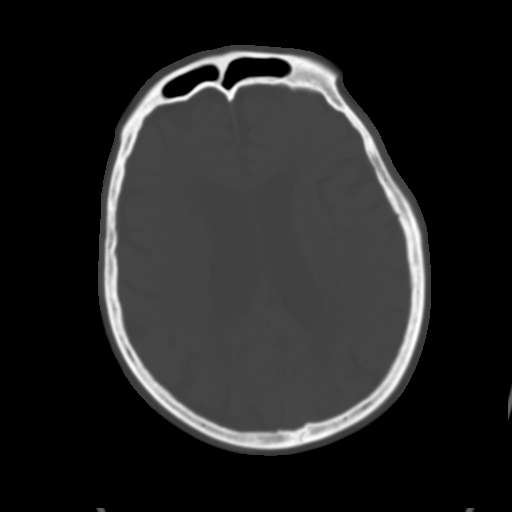
[im 27/36  brain]
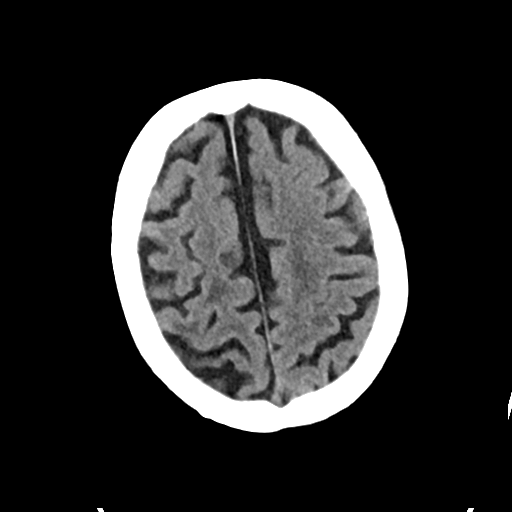
[im 31/36  brain]
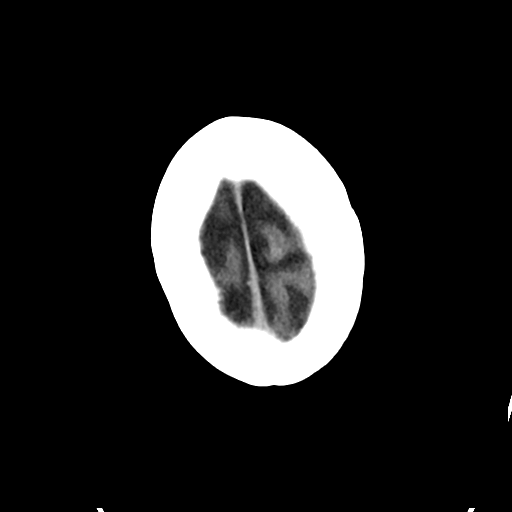

[Series 4: head bone · axial · 0.45mm/px · z∈[-116,-54]mm · 4 of 88 slices shown]
[im 9/88  bone]
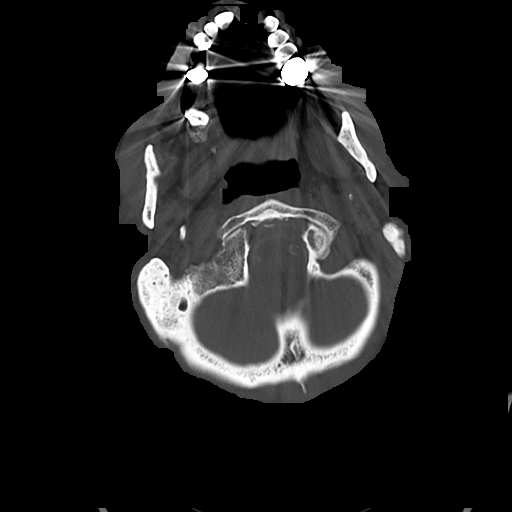
[im 18/88  bone]
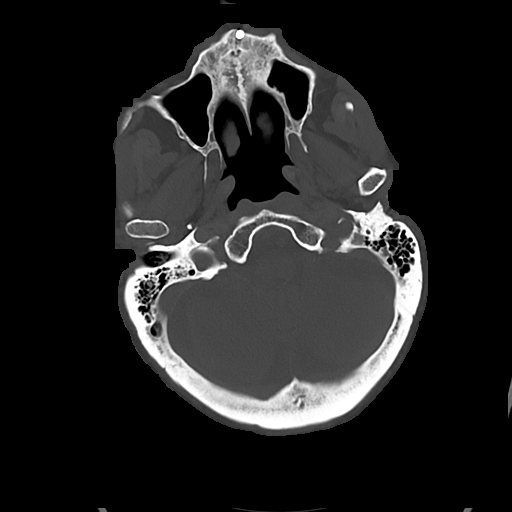
[im 27/88  bone]
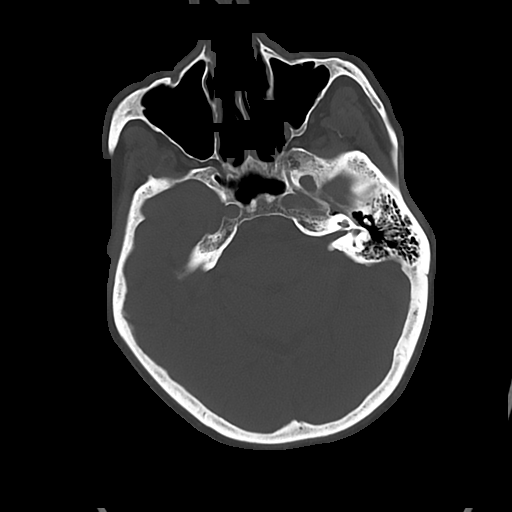
[im 40/88  bone]
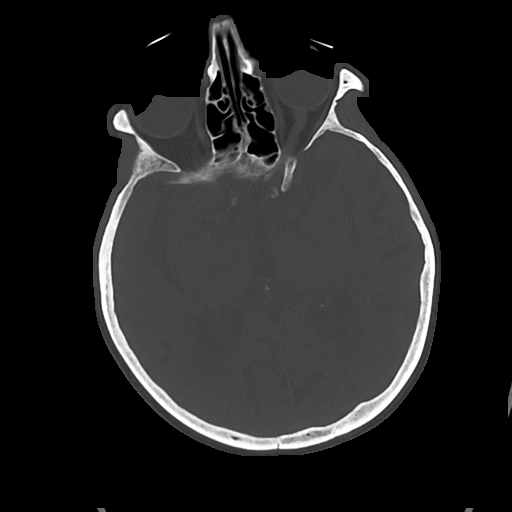

[Series 5: sag soft (person_name) · sagittal · 0.39mm/px · 3 of 54 slices shown]
[im 18/54  brain]
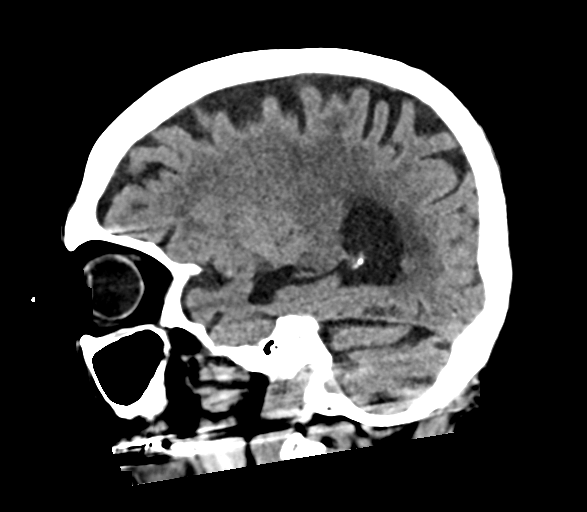
[im 27/54  brain]
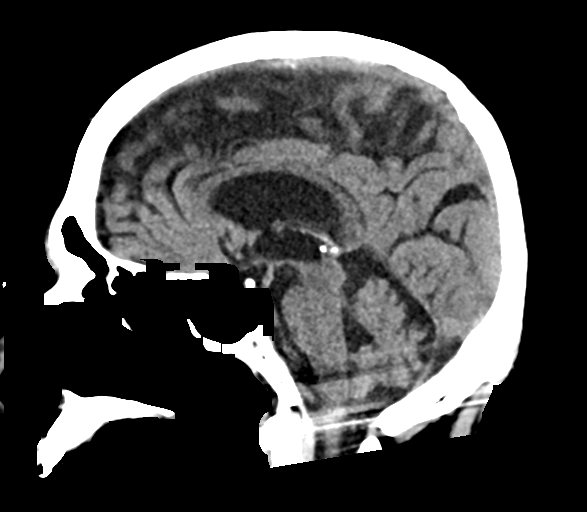
[im 36/54  brain]
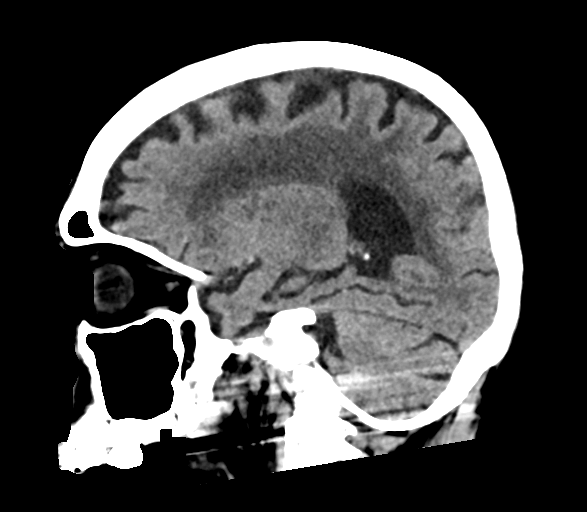

[Series 6: cor soft (person_name) · coronal · 0.37mm/px · 3 of 72 slices shown]
[im 24/72  brain]
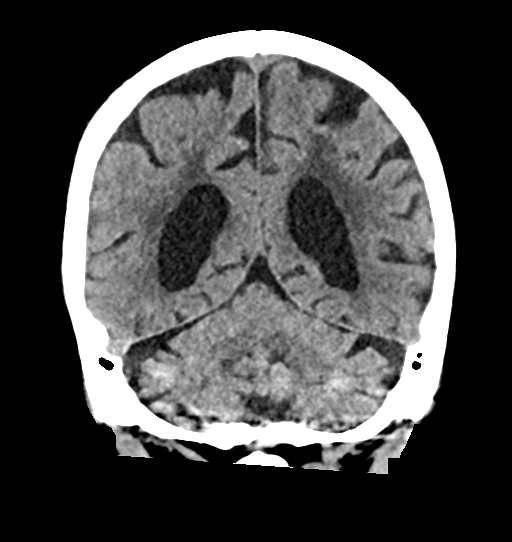
[im 32/72  brain]
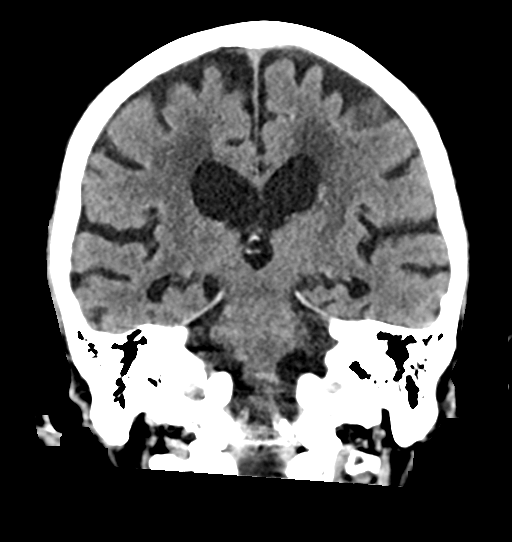
[im 40/72  brain]
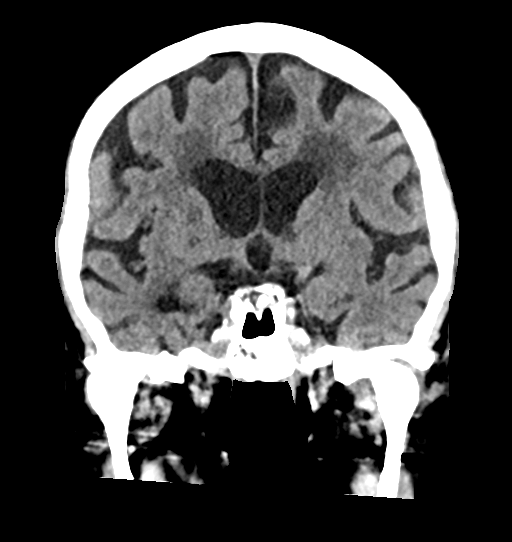

[17 of 47 positions shown; findings below may reference images not displayed]

FINDINGS: Brain: Generalized age related parenchymal volume loss with
commensurate dilatation of the ventricles and sulci. Chronic small
vessel ischemic changes again noted within the bilateral
periventricular and subcortical white matter regions.

No mass, hemorrhage, edema or other evidence of acute parenchymal
abnormality. No extra-axial hemorrhage.

Vascular: Chronic calcified atherosclerotic changes of the large
vessels at the skull base. No unexpected hyperdense vessel.

Skull: Normal. Negative for fracture or focal lesion.

Sinuses/Orbits: No acute finding.

Other: None.
IMPRESSION: 1. No acute findings. No intracranial mass, hemorrhage or edema.
2. Chronic small vessel ischemic changes in the white matter.

## 2020-11-01 ENCOUNTER — Other Ambulatory Visit (HOSPITAL_BASED_OUTPATIENT_CLINIC_OR_DEPARTMENT_OTHER): Payer: Self-pay

## 2021-05-13 ENCOUNTER — Other Ambulatory Visit (HOSPITAL_BASED_OUTPATIENT_CLINIC_OR_DEPARTMENT_OTHER): Payer: Self-pay
